# Patient Record
Sex: Female | Born: 1935 | Race: White | Hispanic: No | Smoking: Never smoker
Health system: Southern US, Community
[De-identification: ages and names within clinical notes are randomized; demographics above are authoritative.]

## PROBLEM LIST (undated history)

## (undated) DIAGNOSIS — S2241XA Multiple fractures of ribs, right side, initial encounter for closed fracture: Secondary | ICD-10-CM

## (undated) DIAGNOSIS — S225XXA Flail chest, initial encounter for closed fracture: Secondary | ICD-10-CM

## (undated) DIAGNOSIS — J90 Pleural effusion, not elsewhere classified: Secondary | ICD-10-CM

## (undated) HISTORY — PX: NASAL SINUS SURGERY: SHX719

## (undated) HISTORY — PX: BACK SURGERY: SHX140

## (undated) HISTORY — PX: BREAST SURGERY: SHX581

## (undated) HISTORY — PX: ABDOMINAL HYSTERECTOMY: SHX81

---

## 1968-09-03 HISTORY — PX: DILATION AND CURETTAGE OF UTERUS: SHX78

## 1993-09-03 HISTORY — PX: CHOLECYSTECTOMY: SHX55

## 1998-06-28 ENCOUNTER — Ambulatory Visit (HOSPITAL_COMMUNITY): Admission: RE | Admit: 1998-06-28 | Discharge: 1998-06-28 | Payer: Self-pay | Admitting: General Surgery

## 1998-10-06 ENCOUNTER — Other Ambulatory Visit: Admission: RE | Admit: 1998-10-06 | Discharge: 1998-10-06 | Payer: Self-pay | Admitting: Obstetrics and Gynecology

## 1999-12-29 ENCOUNTER — Encounter (INDEPENDENT_AMBULATORY_CARE_PROVIDER_SITE_OTHER): Payer: Self-pay

## 1999-12-29 ENCOUNTER — Other Ambulatory Visit: Admission: RE | Admit: 1999-12-29 | Discharge: 1999-12-29 | Payer: Self-pay | Admitting: Obstetrics and Gynecology

## 2000-03-04 ENCOUNTER — Other Ambulatory Visit: Admission: RE | Admit: 2000-03-04 | Discharge: 2000-03-04 | Payer: Self-pay | Admitting: Obstetrics and Gynecology

## 2000-09-03 HISTORY — PX: CARDIAC CATHETERIZATION: SHX172

## 2001-03-07 ENCOUNTER — Other Ambulatory Visit: Admission: RE | Admit: 2001-03-07 | Discharge: 2001-03-07 | Payer: Self-pay | Admitting: Obstetrics and Gynecology

## 2001-03-26 ENCOUNTER — Encounter: Payer: Self-pay | Admitting: *Deleted

## 2001-03-26 ENCOUNTER — Inpatient Hospital Stay (HOSPITAL_COMMUNITY): Admission: AD | Admit: 2001-03-26 | Discharge: 2001-03-27 | Payer: Self-pay | Admitting: *Deleted

## 2001-04-03 ENCOUNTER — Encounter: Payer: Self-pay | Admitting: *Deleted

## 2001-04-03 ENCOUNTER — Ambulatory Visit (HOSPITAL_COMMUNITY): Admission: RE | Admit: 2001-04-03 | Discharge: 2001-04-03 | Payer: Self-pay | Admitting: *Deleted

## 2001-07-16 ENCOUNTER — Encounter: Admission: RE | Admit: 2001-07-16 | Discharge: 2001-09-02 | Payer: Self-pay | Admitting: Internal Medicine

## 2001-08-21 ENCOUNTER — Encounter: Admission: RE | Admit: 2001-08-21 | Discharge: 2001-08-21 | Payer: Self-pay | Admitting: Internal Medicine

## 2001-08-21 ENCOUNTER — Encounter: Payer: Self-pay | Admitting: Internal Medicine

## 2001-09-03 HISTORY — PX: OTHER SURGICAL HISTORY: SHX169

## 2001-09-09 ENCOUNTER — Ambulatory Visit (HOSPITAL_COMMUNITY): Admission: RE | Admit: 2001-09-09 | Discharge: 2001-09-09 | Payer: Self-pay | Admitting: Internal Medicine

## 2002-03-09 ENCOUNTER — Other Ambulatory Visit: Admission: RE | Admit: 2002-03-09 | Discharge: 2002-03-09 | Payer: Self-pay | Admitting: Obstetrics and Gynecology

## 2002-09-15 ENCOUNTER — Ambulatory Visit (HOSPITAL_COMMUNITY): Admission: RE | Admit: 2002-09-15 | Discharge: 2002-09-15 | Payer: Self-pay | Admitting: Internal Medicine

## 2003-04-02 ENCOUNTER — Other Ambulatory Visit: Admission: RE | Admit: 2003-04-02 | Discharge: 2003-04-02 | Payer: Self-pay | Admitting: Obstetrics and Gynecology

## 2003-07-12 ENCOUNTER — Ambulatory Visit (HOSPITAL_COMMUNITY): Admission: RE | Admit: 2003-07-12 | Discharge: 2003-07-12 | Payer: Self-pay | Admitting: *Deleted

## 2003-09-21 ENCOUNTER — Encounter: Admission: RE | Admit: 2003-09-21 | Discharge: 2003-09-21 | Payer: Self-pay | Admitting: Internal Medicine

## 2003-11-04 ENCOUNTER — Encounter: Admission: RE | Admit: 2003-11-04 | Discharge: 2004-01-06 | Payer: Self-pay | Admitting: Neurology

## 2004-01-25 ENCOUNTER — Encounter: Admission: RE | Admit: 2004-01-25 | Discharge: 2004-02-24 | Payer: Self-pay | Admitting: Neurology

## 2005-04-13 ENCOUNTER — Other Ambulatory Visit: Admission: RE | Admit: 2005-04-13 | Discharge: 2005-04-13 | Payer: Self-pay | Admitting: Obstetrics and Gynecology

## 2006-06-10 ENCOUNTER — Other Ambulatory Visit: Admission: RE | Admit: 2006-06-10 | Discharge: 2006-06-10 | Payer: Self-pay | Admitting: Obstetrics & Gynecology

## 2006-08-02 ENCOUNTER — Ambulatory Visit (HOSPITAL_COMMUNITY): Admission: RE | Admit: 2006-08-02 | Discharge: 2006-08-02 | Payer: Self-pay | Admitting: Internal Medicine

## 2007-06-13 ENCOUNTER — Other Ambulatory Visit: Admission: RE | Admit: 2007-06-13 | Discharge: 2007-06-13 | Payer: Self-pay | Admitting: Obstetrics and Gynecology

## 2007-11-04 ENCOUNTER — Inpatient Hospital Stay (HOSPITAL_COMMUNITY): Admission: EM | Admit: 2007-11-04 | Discharge: 2007-11-07 | Payer: Self-pay | Admitting: Emergency Medicine

## 2007-11-10 ENCOUNTER — Ambulatory Visit: Payer: Self-pay | Admitting: Gastroenterology

## 2007-12-01 ENCOUNTER — Encounter: Admission: RE | Admit: 2007-12-01 | Discharge: 2007-12-31 | Payer: Self-pay | Admitting: Internal Medicine

## 2007-12-03 ENCOUNTER — Ambulatory Visit (HOSPITAL_COMMUNITY): Admission: RE | Admit: 2007-12-03 | Discharge: 2007-12-03 | Payer: Self-pay | Admitting: *Deleted

## 2007-12-03 ENCOUNTER — Encounter (INDEPENDENT_AMBULATORY_CARE_PROVIDER_SITE_OTHER): Payer: Self-pay | Admitting: *Deleted

## 2008-02-16 ENCOUNTER — Ambulatory Visit: Payer: Self-pay | Admitting: Vascular Surgery

## 2008-06-23 ENCOUNTER — Other Ambulatory Visit: Admission: RE | Admit: 2008-06-23 | Discharge: 2008-06-23 | Payer: Self-pay | Admitting: Obstetrics and Gynecology

## 2008-09-03 HISTORY — PX: KYPHOPLASTY: SHX5884

## 2009-01-12 ENCOUNTER — Ambulatory Visit (HOSPITAL_COMMUNITY): Admission: RE | Admit: 2009-01-12 | Discharge: 2009-01-12 | Payer: Self-pay | Admitting: *Deleted

## 2009-01-12 ENCOUNTER — Encounter (INDEPENDENT_AMBULATORY_CARE_PROVIDER_SITE_OTHER): Payer: Self-pay | Admitting: *Deleted

## 2009-02-21 ENCOUNTER — Encounter: Admission: RE | Admit: 2009-02-21 | Discharge: 2009-02-21 | Payer: Self-pay | Admitting: Orthopedic Surgery

## 2009-02-24 ENCOUNTER — Encounter: Admission: RE | Admit: 2009-02-24 | Discharge: 2009-02-24 | Payer: Self-pay | Admitting: Orthopedic Surgery

## 2009-02-28 ENCOUNTER — Encounter: Admission: RE | Admit: 2009-02-28 | Discharge: 2009-02-28 | Payer: Self-pay | Admitting: Orthopedic Surgery

## 2009-03-03 ENCOUNTER — Encounter: Admission: RE | Admit: 2009-03-03 | Discharge: 2009-03-03 | Payer: Self-pay | Admitting: Orthopedic Surgery

## 2009-03-15 ENCOUNTER — Encounter: Admission: RE | Admit: 2009-03-15 | Discharge: 2009-03-15 | Payer: Self-pay | Admitting: Orthopedic Surgery

## 2009-04-13 ENCOUNTER — Encounter: Admission: RE | Admit: 2009-04-13 | Discharge: 2009-06-06 | Payer: Self-pay | Admitting: Internal Medicine

## 2009-05-26 ENCOUNTER — Inpatient Hospital Stay (HOSPITAL_COMMUNITY): Admission: EM | Admit: 2009-05-26 | Discharge: 2009-06-04 | Payer: Self-pay | Admitting: Emergency Medicine

## 2009-05-27 ENCOUNTER — Encounter (INDEPENDENT_AMBULATORY_CARE_PROVIDER_SITE_OTHER): Payer: Self-pay | Admitting: Gastroenterology

## 2009-05-31 ENCOUNTER — Encounter (INDEPENDENT_AMBULATORY_CARE_PROVIDER_SITE_OTHER): Payer: Self-pay | Admitting: Gastroenterology

## 2009-09-03 HISTORY — PX: CATARACT EXTRACTION: SUR2

## 2010-03-28 ENCOUNTER — Encounter: Admission: RE | Admit: 2010-03-28 | Discharge: 2010-04-27 | Payer: Self-pay | Admitting: Neurology

## 2010-05-15 ENCOUNTER — Ambulatory Visit (HOSPITAL_COMMUNITY): Admission: RE | Admit: 2010-05-15 | Discharge: 2010-05-15 | Payer: Self-pay | Admitting: Internal Medicine

## 2010-09-07 ENCOUNTER — Encounter
Admission: RE | Admit: 2010-09-07 | Discharge: 2010-09-07 | Payer: Self-pay | Source: Home / Self Care | Attending: Gastroenterology | Admitting: Gastroenterology

## 2010-10-30 ENCOUNTER — Ambulatory Visit (HOSPITAL_COMMUNITY): Payer: Medicare Other | Attending: Internal Medicine

## 2010-10-30 DIAGNOSIS — M810 Age-related osteoporosis without current pathological fracture: Secondary | ICD-10-CM | POA: Insufficient documentation

## 2010-12-07 LAB — HEMOCCULT GUIAC POC 1CARD (OFFICE): Fecal Occult Bld: NEGATIVE

## 2010-12-08 LAB — URINALYSIS, ROUTINE W REFLEX MICROSCOPIC
Bilirubin Urine: NEGATIVE
Glucose, UA: NEGATIVE mg/dL
Hgb urine dipstick: NEGATIVE
Nitrite: NEGATIVE
Specific Gravity, Urine: 1.024 (ref 1.005–1.030)
pH: 5.5 (ref 5.0–8.0)

## 2010-12-08 LAB — BASIC METABOLIC PANEL
BUN: 14 mg/dL (ref 6–23)
BUN: 4 mg/dL — ABNORMAL LOW (ref 6–23)
CO2: 26 mEq/L (ref 19–32)
Chloride: 110 mEq/L (ref 96–112)
Creatinine, Ser: 0.95 mg/dL (ref 0.4–1.2)
GFR calc non Af Amer: 57 mL/min — ABNORMAL LOW (ref >60)
GFR calc non Af Amer: 59 mL/min — ABNORMAL LOW (ref >60)
Glucose, Bld: 78 mg/dL (ref 70–99)
Glucose, Bld: 98 mg/dL (ref 70–99)
Potassium: 3.7 mEq/L (ref 3.5–5.1)
Potassium: 3.7 mEq/L (ref 3.5–5.1)
Sodium: 140 mEq/L (ref 135–145)

## 2010-12-08 LAB — CBC
HCT: 31.8 % — ABNORMAL LOW (ref 36.0–46.0)
HCT: 35 % — ABNORMAL LOW (ref 36.0–46.0)
HCT: 35.5 % — ABNORMAL LOW (ref 36.0–46.0)
Hemoglobin: 10.6 g/dL — ABNORMAL LOW (ref 12.0–15.0)
Hemoglobin: 11.9 g/dL — ABNORMAL LOW (ref 12.0–15.0)
Hemoglobin: 12 g/dL (ref 12.0–15.0)
Hemoglobin: 13 g/dL (ref 12.0–15.0)
MCHC: 33.7 g/dL (ref 30.0–36.0)
MCHC: 33.8 g/dL (ref 30.0–36.0)
MCHC: 33.9 g/dL (ref 30.0–36.0)
MCHC: 33.9 g/dL (ref 30.0–36.0)
MCV: 95.3 fL (ref 78.0–100.0)
MCV: 95.3 fL (ref 78.0–100.0)
MCV: 96.7 fL (ref 78.0–100.0)
Platelets: 208 10*3/uL (ref 150–400)
Platelets: 234 10*3/uL (ref 150–400)
RBC: 3.32 MIL/uL — ABNORMAL LOW (ref 3.87–5.11)
RBC: 4.01 MIL/uL (ref 3.87–5.11)
RDW: 13.9 % (ref 11.5–15.5)
RDW: 14 % (ref 11.5–15.5)
RDW: 14.2 % (ref 11.5–15.5)
WBC: 13.7 10*3/uL — ABNORMAL HIGH (ref 4.0–10.5)
WBC: 9.3 10*3/uL (ref 4.0–10.5)

## 2010-12-08 LAB — COMPREHENSIVE METABOLIC PANEL
ALT: 28 U/L (ref 0–35)
ALT: 29 U/L (ref 0–35)
AST: 26 U/L (ref 0–37)
AST: 35 U/L (ref 0–37)
Albumin: 2.5 g/dL — ABNORMAL LOW (ref 3.5–5.2)
Alkaline Phosphatase: 63 U/L (ref 39–117)
Alkaline Phosphatase: 78 U/L (ref 39–117)
CO2: 24 mEq/L (ref 19–32)
CO2: 24 mEq/L (ref 19–32)
Calcium: 9.4 mg/dL (ref 8.4–10.5)
Chloride: 107 mEq/L (ref 96–112)
Creatinine, Ser: 1.02 mg/dL (ref 0.4–1.2)
GFR calc Af Amer: 52 mL/min — ABNORMAL LOW (ref >60)
GFR calc Af Amer: >60 mL/min (ref >60)
GFR calc non Af Amer: 43 mL/min — ABNORMAL LOW (ref >60)
GFR calc non Af Amer: 52 mL/min — ABNORMAL LOW (ref >60)
Glucose, Bld: 94 mg/dL (ref 70–99)
Potassium: 3.5 mEq/L (ref 3.5–5.1)
Potassium: 3.5 mEq/L (ref 3.5–5.1)
Sodium: 137 mEq/L (ref 135–145)
Total Bilirubin: 0.5 mg/dL (ref 0.3–1.2)
Total Protein: 6.4 g/dL (ref 6.0–8.3)

## 2010-12-08 LAB — DIFFERENTIAL
Basophils Absolute: 0.1 10*3/uL (ref 0.0–0.1)
Basophils Relative: 0 % (ref 0–1)
Basophils Relative: 0 % (ref 0–1)
Eosinophils Absolute: 0 10*3/uL (ref 0.0–0.7)
Eosinophils Absolute: 0.1 10*3/uL (ref 0.0–0.7)
Eosinophils Relative: 0 % (ref 0–5)
Eosinophils Relative: 1 % (ref 0–5)
Lymphocytes Relative: 7 % — ABNORMAL LOW (ref 12–46)
Lymphs Abs: 1 10*3/uL (ref 0.7–4.0)
Monocytes Absolute: 0.4 10*3/uL (ref 0.1–1.0)
Monocytes Relative: 3 % (ref 3–12)

## 2010-12-08 LAB — RETICULOCYTES
RBC.: 3.71 MIL/uL — ABNORMAL LOW (ref 3.87–5.11)
Retic Ct Pct: 0.8 % (ref 0.4–3.1)

## 2010-12-08 LAB — URINE MICROSCOPIC-ADD ON

## 2010-12-08 LAB — STOOL CULTURE

## 2010-12-08 LAB — CULTURE, BLOOD (ROUTINE X 2)
Culture: NO GROWTH
Culture: NO GROWTH

## 2010-12-08 LAB — CK TOTAL AND CKMB (NOT AT ARMC): Total CK: 18 U/L (ref 7–177)

## 2010-12-08 LAB — CLOSTRIDIUM DIFFICILE EIA

## 2010-12-08 LAB — IRON AND TIBC
Saturation Ratios: 35 % (ref 20–55)
UIBC: 136 ug/dL

## 2010-12-08 LAB — VITAMIN B12: Vitamin B-12: 1181 pg/mL — ABNORMAL HIGH (ref 211–911)

## 2010-12-08 LAB — HEMOCCULT GUIAC POC 1CARD (OFFICE): Fecal Occult Bld: NEGATIVE

## 2010-12-08 LAB — FOLATE: Folate: >20 ng/mL

## 2010-12-08 LAB — LACTATE DEHYDROGENASE: LDH: 126 U/L (ref 94–250)

## 2011-01-16 NOTE — Op Note (Signed)
NAMEJAYLYN, Linda [name]              ACCOUNT NO.:  [id number]   MEDICAL RECORD NO.:  [id number]          PATIENT TYPE:  AMB   LOCATION:  ENDO                         FACILITY:  [facility]   PHYSICIAN:  Georgiana Spinner, M.D.    DATE OF BIRTH:  30-Mar-1929   DATE OF PROCEDURE:  DATE OF DISCHARGE:                               OPERATIVE REPORT   PROCEDURE:  Upper endoscopy.   INDICATIONS:  Question of Barrett esophagus.   ANESTHESIA:  Fentanyl 25 mcg, Versed 2 mg.   PROCEDURE:  With the patient mildly sedated in the left lateral  decubitus position, the Pentax videoscopic endoscope was inserted in the  mouth, passed under direct vision through the esophagus, which appeared  normal until we reached the distal esophagus and there appeared to be  areas of Barrett's.  Islands of mucosa were more salmon-colored than the  surrounding whitish background.  We photographed them and then did  biopsies of this on direct view.  We then entered into the stomach  fundus, body, antrum, duodenal bulb, second portion of the duodenum,  which appeared normal.  From this point, the endoscope was slowly  withdrawn, taking circumferential views of the duodenal mucosa until the  endoscope had been pulled back into the stomach, placed in retroflexion  to view the stomach from below.  A widely open GE junction was noted.  The endoscope was then straightened and withdrawn, taking  circumferential views of the remaining gastric and esophageal mucosa,  stopping again in the distal esophagus where, again, changes were noted  that were photographed and biopsied.  The endoscope was withdrawn.  The  patient's vital signs, pulse oximeter remained stable.  The patient  tolerated the procedure well without any apparent complications.   FINDINGS:  Changes in the esophagus compatible with Barrett esophagus,  biopsied.  Await biopsy report.  The patient will call me for results  and follow up with me as an outpatient.       ______________________________  Georgiana Spinner, M.D.     GMO/MEDQ  D:  01/12/2009  T:  01/12/2009  Job:  [id number]

## 2011-01-16 NOTE — Procedures (Signed)
DUPLEX DEEP VENOUS EXAM - LOWER EXTREMITY   INDICATION:  Left leg pain and swelling.   HISTORY:  Edema:  Left leg edema x3 months.  Trauma/Surgery:  No.  Pain:  Pain for three months in the left leg.  PE:  No.  Previous DVT:  No.  Anticoagulants:  Patient was on aspirin until early March, 2009 when she  discontinued it secondary to stomach ulcers.  Other:  No.   DUPLEX EXAM:                CFV   SFV   PopV   PTV   GSV                R  L  R  L  R  L   R  L  R  L  Thrombosis    O  o     o     o      o     o  Spontaneous   +  +     +     +      +     +  Phasic        +  +     +     +      +     +  Augmentation  +  +     +     +      +     +  Compressible  +  +     +     +      +     +  Competent     +  O     +     +      +     +   Legend:  + - yes  o - no  p - partial  D - decreased   IMPRESSION:  1. No evidence of left leg deep or superficial vein thrombus.  No      evidence of left leg baker's cyst.  2. Mild reflux is seen in the left common femoral vein and left short      saphenous vein.    _____________________________  Janetta Hora. Fields, MD   MC/MEDQ  D:  02/16/2008  T:  02/16/2008  Job:  [id number]

## 2011-01-16 NOTE — Op Note (Signed)
NAMEAVILENE, [name]              ACCOUNT NO.:  [id number]   MEDICAL RECORD NO.:  [id number]          PATIENT TYPE:  AMB   LOCATION:  ENDO                         [facility]ACILITY:  [facility]   PHYSICIAN:  Georgiana Spinner, M.D.    DATE OF BIRTH:  18-Mar-1929   DATE OF PROCEDURE:  12/03/2007  DATE OF DISCHARGE:                               OPERATIVE REPORT   PROCEDURE:  Upper endoscopy.   INDICATIONS:  Follow-up of gastric ulcer that bled.   ANESTHESIA:  Fentanyl 50 mcg, Versed 3 mg.   PROCEDURE:  With the patient mildly sedated in the left lateral  decubitus position, the Pentax videoscopic endoscope was inserted in the  mouth, passed under direct vision through the esophagus.  There appeared  to be some changes of Barrett's esophagus, photographed and biopsied.  We entered into the stomach.  Fundus, body, antrum, duodenal bulb,  second portion of the duodenum appeared normal.  From this point the  endoscope was slowly withdrawn taking circumferential views of duodenal  mucosa until the endoscope had been pulled back into stomach, placed in  retroflexion to view the stomach from below.  The endoscope was  straightened and withdrawn taking circumferential views of the remaining  gastric and esophageal mucosa.  The patient's vital signs and pulse  oximeter remained stable.  The patient tolerated the procedure well  without apparent complications.   FINDINGS:  Question of Barrett's esophagus, biopsied.   Await biopsy report.  The patient will call me for results and follow up  with me as an outpatient.           ______________________________  Georgiana Spinner, M.D.     GMO/MEDQ  D:  12/03/2007  T:  12/03/2007  Job:  [id number]

## 2011-01-16 NOTE — H&P (Signed)
NAMEBENITA, Linda [name]              ACCOUNT NO.:  [id number]   MEDICAL RECORD NO.:  [id number]          PATIENT TYPE:  INP   LOCATION:  0105                         FACILITY:  [facility]   PHYSICIAN:  Della Goo, M.D. DATE OF BIRTH:  05-Apr-1929   DATE OF ADMISSION:  11/04/2007  DATE OF DISCHARGE:                              HISTORY & PHYSICAL   PRIMARY CARE PHYSICIAN:  Dr. Merri Brunette.   CHIEF COMPLAINT:  Nausea, vomiting.   HISTORY OF PRESENT ILLNESS:  This is a 77 year old female presenting to  the emergency department with complaints of nausea, vomiting, abdominal  pain.  She reports having abdominal pain for 2 weeks, also nausea which  started today.  The patient also reports vomiting twice and vomiting up  blood.  The patient was seen and evaluated in the emergency department.  Found to have low blood pressure and treated with IV fluids and had  improvement in her blood pressure.  The patient also continued to have  nausea which was unrelieved with medical therapy and was referred for  admission.  The patient describes having abdominal pain which is dull  and diffuse and rates the pain as being a 4/10.   PAST MEDICAL HISTORY:  Significant for hypothyroidism, coronary artery  disease/angina, and neuropathy.   PAST SURGICAL HISTORY:  History of multiple back surgeries.   MEDICATIONS:  The patient is unable to give at this time.   ALLERGIES:  IBUPROFEN and PENICILLINS.   SOCIAL HISTORY:  The patient lives with her oldest son.  She is a  nonsmoker, nondrinker.   FAMILY HISTORY:  Noncontributory.   PHYSICAL EXAMINATION:  GENERAL:  Pleasant, thin 77 year old female in no  discomfort or acute distress.  VITAL SIGNS:  Temperature 98.6 orally, blood pressure 120/65, heart rate  83, respirations 18, oxygen saturation 100%.  HEENT:  Normocephalic, atraumatic.  Pupils equally round and reactive to  light.  Extraocular muscles intact.  Funduscopic benign.  Oropharynx is   clear.  NECK:  Supple.  Full range of motion.  No thyromegaly, adenopathy,  jugular venous distention.  CARDIOVASCULAR:  Regular rate and rhythm.  No murmurs, gallops or rubs.  LUNGS:  Clear to auscultation bilaterally.  ABDOMEN:  Positive bowel sounds.  Soft, nontender, nondistended.  EXTREMITIES:  Without cyanosis, clubbing or edema.  NEUROLOGIC:  Neurologic examination nonfocal.   LABORATORY STUDIES:  White blood cell count 7.5, hemoglobin 9.4,  hematocrit 27.6, platelets 226,000, neutrophils 80%, lymphocytes 12%.  Sodium 137, potassium 4.2, chloride 105, carbon dioxide 26, BUN 29,  creatinine 1.06, calcium 8.4, lipase 29.  Urinalysis negative.  CT scan  of the abdomen negative for any acute findings.   ASSESSMENT:  A 77 year old female being admitted with:  1. Acute gastroenteritis.  2. Abdominal pain.  3. Anemia.  4. Prerenal azotemia.  5. Coronary artery disease history.  6. Hypothyroidism history.   PLAN:  The patient will be made n.p.o. which will advance to clears as  the patient may tolerate.  IV fluids have been ordered.  The patient  will be started on antiemetic and pain control therapy as needed.  An  anemia workup will also be started with serial hemoglobin level checks  for the next 48 hours.  Deep venous thrombosis and GI prophylaxis have  been ordered as well. Gastroenterology will be consulted.  In the past  the patient has seen Dr. Sabino Gasser and had a colonoscopy performed in  2004.      Della Goo, M.D.  Electronically Signed     HJ/MEDQ  D:  11/04/2007  T:  11/05/2007  Job:  [id number]   cc:   Soyla Murphy. Renne Crigler, M.D.  Fax: [phone]

## 2011-01-16 NOTE — Discharge Summary (Signed)
Linda [name], Linda [name]              ACCOUNT NO.:  [id number]   MEDICAL RECORD NO.:  [id number]          PATIENT TYPE:  INP   LOCATION:  1515                         FACILITY:  [facility]   PHYSICIAN:  Isidor Holts, M.D.  DATE OF BIRTH:  [birth year]   DATE OF ADMISSION:  11/04/2007  DATE OF DISCHARGE:  11/07/2007                               DISCHARGE SUMMARY   PRIMARY CARE PHYSICIAN:  Dr. Merri Brunette.   PRIMARY GASTROENTEROLOGIST:  Dr. Sabino Gasser.Marland Kitchen   DISCHARGE DIAGNOSES:  1. Acute gastritis.  2. Multiple gastric ulcers confirmed by EGD November 06, 2007.  3. Upper gastrointestinal bleed secondary to #2 above.  4. Acute blood loss anemia secondary to #3 above.  Required      transfusion 3 units PRBC on November 06, 2007.  5. Dehydration.  6. Coronary artery disease.  7. History of right lower lobe lung nodule.  8. History of vertigo.  9. History of multiple back surgeries.   DISCHARGE MEDICATIONS:  Protonix 40 mg p.o. b.i.d.   PREADMISSION MEDICATIONS.:  1. Levothroid.  2. Isosorbide mononitrate.  3. Renexa.  4. Evista.  5. Fosamax.  6. Multivitamin.  7. Calcium.  8. Flax seed oil.  Note dosages of these medications are unknown, however no change has  been made during this hospitalization. Aspirin, however, has been  discontinued secondary to gastric ulcers/gastrointestinal bleed until  reevaluated by primary MD, Dr. Merri Brunette.   PROCEDURES:  1. Abdominal/chest x-ray dated November 04, 2007. This showed no acute      cardiopulmonary disease. Bowel gas pattern appears normal.  There      is a nodule in the right lung. Follow-up noncontrast CT of chest      recommended.  2. Head CT scan dated November 05, 2007. This showed no acute intracranial      abnormality,  stable exam.  3. Abdominal/pelvic CT scan dated November 04, 2007. This showed negative      CT of the abdomen. Biliary ductal tree appears slightly prominent      but this is not unusual in a patient of this age who has had  a      previous cholecystectomy.  Pelvic CT scan showed no acute finding      in the pelvis.  There was diverticulosis without CT evidence of      diverticulitis.  4. Chest CT scan dated November 04, 2007.  This relates to the 5 mm      calcified granuloma right lower lobe.  There are a few other      peripheral nodules in the right lower lobe and right middle lobe      that this probably related to old granulomatous disease.  There was      no worrisome finding in the chest.  5. Upper GI endoscopy dated November 05, 2007 done by Dr. Rob Bunting,      gastroenterologist.  This showed three gastric ulcers, all cleaned      based but was fairly deep appearing. CLO testing done to check for      H. Pylori, this was negative.  CONSULTATIONS:  Dr. Rob Bunting, gastroenterologist.   ADMISSION HISTORY:  As in H&P notes of November 04, 2007 dictated by Dr.  Della Goo.  However in brief,  this is a 77 year old female, with  a known history of hypothyroidism, coronary artery disease, neuropathy,  history of multiple back surgeries who presents with nausea and vomiting  associated with hematemesis.  She was admitted for further evaluation,  investigation and management.   CLINICAL COURSE:  1. Acute gastritis. For details of presentation, refer to admission      history above.  The patient was managed with bowel rest, proton      pump inhibitor treatment and antiemetics and by November 05, 2007 the      symptoms had resolved.  It is likely the patient had a low grade      gastritis which precipitated her vomiting. For other findings refer      to #2 below.   1. Upper GI bleed.  As mentioned above, the patient presented with      episodes of vomiting associated with hematemesis. Reportedly she      was found to have low BP in the emergency department and was      resuscitated with intravenous fluids, resulting in a blood pressure      of 120/65 mmHg.  She was maintained on bowel rest, blood was  typed      and crossed,  intravenous fluids were continued as well as twice-      daily proton pump inhibitor.  As she had associated abdominal pain,      it was felt that a GI consultation warranted.  This was kindly      provided by Dr. Rob Bunting who performed an upper GI endoscopy      on November 05, 2007 which demonstrated 3 gastric ulcers.  He has      recommended continued twice-daily proton pump inhibitor as well as      follow-up repeat endoscopy in approximately 2 months' time.      Predictably the patient's pre-admission Aspirin is on hold.      Happily, the CLO-test for H pylori was negative.   1. Acute blood loss anemia.  This is secondary to #1 above.  The      patient's hemoglobin at the time of presentation appeared fairly      reasonable at 9.4; however, over the subsequent couple days of      hospitalization it dropped to a low of 8.7 on November 06, 2007      necessitating transfusion with 2 units of PRBCs resulting in a      satisfactory post transfusion bump of 11.7 hemoglobin on November 07, 2007. The patient does have a known history of chronic anemia. Iron      studies showed the following findings.  Iron 59, TIBC 275,      percentage saturation 21.  B12 was 790.  Ferritin was 28. Likely      these findings were consistent with anemia of chronic disease.  The      patient certainly would benefit from periodic hemoglobin checks. We      shall defer this to her primary MD.   1. Dehydration.  The patient at the time of presentation had a BUN of      29, creatinine 1.06.  This was likely secondary to vomiting.  It      was managed with intravenous fluid hydration and  we are pleased to      note that as of November 07, 2007  BUN was 9, creatinine 1.04.   1. History of coronary artery disease.  There were no symptoms      referable to this during the course of the patient's      hospitalization.   1. History of lung nodule.  The patient had suspicious findings for       right lower lung nodule on initial chest x-ray however, chest CT      scan showed no worrisome findings. Apparently findings reported on      x-ray were related to old granulomatous disease.  The patient has      been reassured accordingly.   DISPOSITION:  The patient was on November 07, 2007 considered sufficiently  clinically stable to be discharged. She was therefore discharged  accordingly.   DIET:  Heart-healthy.   ACTIVITY:  Recommended to increase activity slowly.   FOLLOW-UP INSTRUCTIONS:  The patient is recommended to follow up with  Dr. Merri Brunette, her primary MD, within 1 week of discharge.  She is to  call for an appointment.  She is also to follow up with Dr. Sabino Gasser,  gastroenterologist, in 2 weeks.   SPECIAL INSTRUCTIONS:  The patient has been recommended to visit Dr.  Carolee Rota office in the a.m. of November 10, 2007 for a hemoglobin check.  She  is also to avoid Aspirin until reviewed by Dr. Merri Brunette on follow-up  visit. All of this has been communicated to the patient and she has  verbalized her understanding.      Isidor Holts, M.D.  Electronically Signed     CO/MEDQ  D:  11/07/2007  T:  11/07/2007  Job:  [id number]   cc:   Soyla Murphy. Renne Crigler, M.D.  Fax: [phone]   Georgiana Spinner, M.D.  Fax: [phone]

## 2011-01-19 NOTE — Op Note (Signed)
   NAME:  Linda [name], Linda [name]                        ACCOUNT NO.:  [id number]   MEDICAL RECORD NO.:  [id number]                   PATIENT TYPE:  AMB   LOCATION:  ENDO                                 [facility]ACILITY[facility]   PHYSICIAN:  Georgiana Spinner, M.D.                 DATE OF BIRTH:  Jun 05, 1929   DATE OF PROCEDURE:  07/12/2003  DATE OF DISCHARGE:                                 OPERATIVE REPORT   PROCEDURE:  Colonoscopy.   INDICATIONS:  Colon cancer screening.   ANESTHESIA:  Demerol 40 mg, Versed 7 mg.   DESCRIPTION OF PROCEDURE:  With the patient mildly sedated in the left  lateral decubitus position, the Olympus videoscopic colonoscope was inserted  in the rectum and passed under direct vision to the cecum, identified by the  ileocecal valve and base of the cecum.  From this point the colonoscope was  slowly withdrawn, taking circumferential views of the colonic mucosa,  stopping only in the rectum, which appeared normal on direct and retroflexed  view.  The endoscope was straightened and withdrawn.  The patient's vital  signs and pulse oximetry remained stable.  The patient tolerated the  procedure well without apparent complications.   FINDINGS:  Unremarkable examination.   PLAN:  Repeat in five to 10 years.                                               Georgiana Spinner, M.D.    GMO/MEDQ  D:  07/12/2003  T:  07/12/2003  Job:  [id number]

## 2011-01-19 NOTE — Discharge Summary (Signed)
[place]  Patient:    Linda [name], Linda [name]                     MRN: [id number] Adm. Date:  [id number] Disc. Date: [id number] Attending:  Lenise Herald H Dictator:   Raymon Mutton, P.A. CC:         Soyla Murphy. Renne Crigler, M.D.   Discharge Summary  DATE OF BIRTH:  October 23, 2028  DISCHARGE DIAGNOSES: 1. Chest pain, etiology undetermined, status post negative Cardiolite stress    test. 2. Hypothyroidism. 3. Status post multiple back surgeries with peripheral neuropathy. 4. Right middle lobe lung nodule diagnosed by chest x-ray on March 26, 2001,    that requires CT scan follow-up.  HISTORY OF PRESENT ILLNESS:  Linda [name] is a 77 year old white female who was referred to [place] by Soyla Murphy. Renne Crigler, M.D., with complaints of chest pain and subsequent positive Cardiolite test for anteroapical ischemia and ejection fraction 74%.  She exercised for three minutes.  Her heart rate went up to 137, which is 91% of predicted maximum heart rate.  The patient developed shortness of breath and fatigue secondary to that test and the exercise part of the test was stopped.  She presented yesterday with an episode of chest pain that started a couple of nights ago and awakened her from sleep.  She described more as a tightness radiating to the toe and shortness of breath, but no nausea.  She also experienced a clamminess of her limbs for probably five minutes.  The episode of chest pain repeated itself in the office and the patient was given nitroglycerin spray, but the EKG which was done prior to the episode did not show any changes.  A Cardiolite test was done on March 18, 2001.  At the time of the test, it was recommended that the patient undergo cardiac catheterization for definitive diagnosis.  HOSPITAL COURSE:  The patient was admitted to the telemetry unit and started on heparin per pharmacy protocol, aspirin, and nitroglycerin IV titrated.  She obtained relief.  She was  scheduled for a cardiac catheterization the next morning.  Her blood work revealed negative cardiac enzymes.  Her CBC showed a white blood cell count of 5.7, a hemoglobin of 12.4, a hematocrit of 36, and platelets 236.  The BMP showed sodium 141, potassium 4.2, BUN 19, creatinine 1.0, and glucose 152.  This morning, the patient was found in stable condition and free of chest pain.  She was taken to the catheterization laboratory and was given appropriate sedation.  The catheterization was performed by Lenise Herald, M.D.  Essentially it was negative cardiac catheterization.  The coronary arteries were clear.  The patient was transferred back to the telemetry unit in stable condition and heparin was discontinued.  As a part of routine preparation for the cardiac catheterization, the patient had a chest x-ray yesterday.  The report showed a questionable 7 mm nodule overlying the right middle lobe lung.  No prior films were available for comparison.  It was recommended that the patient have a CT scan.  Her EKGs that were taken here in the hospital remained in normal sinus rhythm and did not reveal any acute changes.  Prior to the cardiac study, the patient was screened for phase 2 study and consent was signed.  Heparin was stopped and she was started on Plavix.  After catheterization, which was clean, the Plavix was stopped.  DISPOSITION:  The patient was discharged home on March 27, 2001, in stable condition.  DISCHARGE MEDICATIONS: 1. Levothyroxine 50 mcg q.d. 2. Neurontin 300 mg t.i.d. 3. Evista 60 mg q.d. 4. Aspirin 325 mg q.d. 5. Protonix 40 mg q.d.  DISCHARGE FOLLOW-UP:  An appointment for a CT scan was scheduled for April 03, 2001, at 8:30 a.m.  This will be done at [facility].  A follow-up appointment with Lenise Herald, M.D., in two to three weeks after the hospitalization.  Also, the patient was advised to follow up with Soyla Murphy. Renne Crigler,  M.D.  DISCHARGE INSTRUCTIONS:  No driving.  No heavy lifting.  No pushing.  No strenuous activities.  No sexual activity for three days after the catheterization.  WOUND CARE:  The patient was instructed to gently wash the groin area with mild soap and avoid any rubbing.  She is to report any signs of inflammation and increased swelling, redness, or any signs of bleeding or oozing to our office and the number was provided.  DISCHARGE DIET:  Low-fat, low-salt, low-cholesterol diet. DD:  03/27/01 TD:  03/28/01 Job: [id number]

## 2011-05-02 ENCOUNTER — Encounter (HOSPITAL_COMMUNITY): Payer: Medicare Other | Attending: Internal Medicine

## 2011-05-02 DIAGNOSIS — M810 Age-related osteoporosis without current pathological fracture: Secondary | ICD-10-CM | POA: Insufficient documentation

## 2011-05-17 ENCOUNTER — Other Ambulatory Visit: Payer: Self-pay | Admitting: Neurosurgery

## 2011-05-17 DIAGNOSIS — M546 Pain in thoracic spine: Secondary | ICD-10-CM

## 2011-05-24 ENCOUNTER — Ambulatory Visit
Admission: RE | Admit: 2011-05-24 | Discharge: 2011-05-24 | Disposition: A | Discharge status: Home / Self Care | Payer: Medicare Other | Source: Ambulatory Visit | Attending: Neurosurgery | Admitting: Neurosurgery

## 2011-05-24 DIAGNOSIS — M546 Pain in thoracic spine: Secondary | ICD-10-CM

## 2011-05-28 LAB — COMPREHENSIVE METABOLIC PANEL
ALT: 20
AST: 23
CO2: 26
Chloride: 105
GFR calc Af Amer: >60
GFR calc non Af Amer: 50 — ABNORMAL LOW
Sodium: 137
Total Bilirubin: 0.8

## 2011-05-28 LAB — CBC
HCT: 25.3 — ABNORMAL LOW
HCT: 25.5 — ABNORMAL LOW
HCT: 33.8 — ABNORMAL LOW
MCV: 91.1
Platelets: 184
Platelets: 196
Platelets: 204
RBC: 3.03 — ABNORMAL LOW
RDW: 12.8
RDW: 13.2
WBC: 7.2
WBC: 7.5

## 2011-05-28 LAB — BASIC METABOLIC PANEL
BUN: 11
BUN: 29 — ABNORMAL HIGH
BUN: 9
Calcium: 8.6
Creatinine, Ser: 0.98
Creatinine, Ser: 1.04
GFR calc Af Amer: >60
GFR calc non Af Amer: 51 — ABNORMAL LOW
GFR calc non Af Amer: 55 — ABNORMAL LOW
GFR calc non Af Amer: 59 — ABNORMAL LOW
Glucose, Bld: 116 — ABNORMAL HIGH
Glucose, Bld: 77
Potassium: 4.3
Potassium: 4.4
Potassium: 4.6

## 2011-05-28 LAB — CROSSMATCH

## 2011-05-28 LAB — HEMOGLOBIN AND HEMATOCRIT, BLOOD
HCT: 26.9 — ABNORMAL LOW
Hemoglobin: 8.6 — ABNORMAL LOW
Hemoglobin: 9 — ABNORMAL LOW
Hemoglobin: 9.2 — ABNORMAL LOW

## 2011-05-28 LAB — URINALYSIS, ROUTINE W REFLEX MICROSCOPIC
Hgb urine dipstick: NEGATIVE
Nitrite: NEGATIVE
Specific Gravity, Urine: 1.008
Urobilinogen, UA: 0.2
pH: 8

## 2011-05-28 LAB — DIFFERENTIAL
Basophils Absolute: 0
Basophils Absolute: 0
Basophils Relative: 0
Eosinophils Absolute: 0
Eosinophils Relative: 0
Eosinophils Relative: 1
Lymphocytes Relative: 25

## 2011-05-28 LAB — IRON AND TIBC
Iron: 59
Saturation Ratios: 21
TIBC: 275
UIBC: 216

## 2011-05-28 LAB — CLOTEST (H. PYLORI), BIOPSY: Helicobacter screen: NEGATIVE

## 2011-05-28 LAB — TSH: TSH: 0.613

## 2011-05-28 LAB — B-NATRIURETIC PEPTIDE (CONVERTED LAB): Pro B Natriuretic peptide (BNP): 121 — ABNORMAL HIGH

## 2011-05-28 LAB — FERRITIN: Ferritin: 28 (ref 10–291)

## 2011-05-28 LAB — LIPASE, BLOOD: Lipase: 29

## 2011-05-28 LAB — FOLATE RBC: RBC Folate: 1731 — ABNORMAL HIGH

## 2011-05-28 LAB — ABO/RH: ABO/RH(D): O POS

## 2011-10-19 DIAGNOSIS — L719 Rosacea, unspecified: Secondary | ICD-10-CM | POA: Diagnosis not present

## 2011-10-19 DIAGNOSIS — L821 Other seborrheic keratosis: Secondary | ICD-10-CM | POA: Diagnosis not present

## 2011-10-19 DIAGNOSIS — L570 Actinic keratosis: Secondary | ICD-10-CM | POA: Diagnosis not present

## 2011-11-01 DIAGNOSIS — H524 Presbyopia: Secondary | ICD-10-CM | POA: Diagnosis not present

## 2011-11-05 DIAGNOSIS — M810 Age-related osteoporosis without current pathological fracture: Secondary | ICD-10-CM | POA: Diagnosis not present

## 2011-11-07 ENCOUNTER — Encounter (HOSPITAL_COMMUNITY): Payer: Self-pay

## 2011-11-07 ENCOUNTER — Encounter (HOSPITAL_COMMUNITY)
Admission: RE | Admit: 2011-11-07 | Discharge: 2011-11-07 | Disposition: A | Discharge status: Home / Self Care | Payer: Medicare Other | Source: Ambulatory Visit | Attending: Internal Medicine | Admitting: Internal Medicine

## 2011-11-07 DIAGNOSIS — M810 Age-related osteoporosis without current pathological fracture: Secondary | ICD-10-CM | POA: Diagnosis not present

## 2011-11-07 MED ORDER — DENOSUMAB 60 MG/ML ~~LOC~~ SOLN
60.0000 mg | SUBCUTANEOUS | Status: AC
  Administered 2011-11-07: 60 mg via SUBCUTANEOUS
  Filled 2011-11-07: qty 1

## 2011-11-07 NOTE — Progress Notes (Signed)
Tolerated well

## 2011-11-15 ENCOUNTER — Emergency Department (HOSPITAL_COMMUNITY)
Admission: EM | Admit: 2011-11-15 | Discharge: 2011-11-15 | Disposition: A | Discharge status: Home / Self Care | Payer: Medicare Other | Attending: Emergency Medicine | Admitting: Emergency Medicine

## 2011-11-15 ENCOUNTER — Emergency Department (INDEPENDENT_AMBULATORY_CARE_PROVIDER_SITE_OTHER)
Admission: EM | Admit: 2011-11-15 | Discharge: 2011-11-15 | Disposition: A | Discharge status: Nursing Home (Includes ICF) | Payer: Medicare Other | Source: Home / Self Care

## 2011-11-15 ENCOUNTER — Encounter (HOSPITAL_COMMUNITY): Payer: Self-pay | Admitting: Emergency Medicine

## 2011-11-15 ENCOUNTER — Other Ambulatory Visit: Payer: Self-pay

## 2011-11-15 ENCOUNTER — Emergency Department (HOSPITAL_COMMUNITY): Payer: Medicare Other

## 2011-11-15 DIAGNOSIS — R51 Headache: Secondary | ICD-10-CM | POA: Insufficient documentation

## 2011-11-15 DIAGNOSIS — Y92009 Unspecified place in unspecified non-institutional (private) residence as the place of occurrence of the external cause: Secondary | ICD-10-CM | POA: Insufficient documentation

## 2011-11-15 DIAGNOSIS — K5289 Other specified noninfective gastroenteritis and colitis: Secondary | ICD-10-CM

## 2011-11-15 DIAGNOSIS — E860 Dehydration: Secondary | ICD-10-CM | POA: Diagnosis not present

## 2011-11-15 DIAGNOSIS — Z79899 Other long term (current) drug therapy: Secondary | ICD-10-CM | POA: Insufficient documentation

## 2011-11-15 DIAGNOSIS — S0180XA Unspecified open wound of other part of head, initial encounter: Secondary | ICD-10-CM

## 2011-11-15 DIAGNOSIS — G319 Degenerative disease of nervous system, unspecified: Secondary | ICD-10-CM | POA: Diagnosis not present

## 2011-11-15 DIAGNOSIS — W19XXXA Unspecified fall, initial encounter: Secondary | ICD-10-CM | POA: Insufficient documentation

## 2011-11-15 DIAGNOSIS — S0100XA Unspecified open wound of scalp, initial encounter: Secondary | ICD-10-CM | POA: Insufficient documentation

## 2011-11-15 DIAGNOSIS — R031 Nonspecific low blood-pressure reading: Secondary | ICD-10-CM | POA: Diagnosis not present

## 2011-11-15 DIAGNOSIS — K529 Noninfective gastroenteritis and colitis, unspecified: Secondary | ICD-10-CM

## 2011-11-15 DIAGNOSIS — M5030 Other cervical disc degeneration, unspecified cervical region: Secondary | ICD-10-CM | POA: Insufficient documentation

## 2011-11-15 DIAGNOSIS — S0990XA Unspecified injury of head, initial encounter: Secondary | ICD-10-CM | POA: Insufficient documentation

## 2011-11-15 DIAGNOSIS — M542 Cervicalgia: Secondary | ICD-10-CM | POA: Diagnosis not present

## 2011-11-15 DIAGNOSIS — S0101XA Laceration without foreign body of scalp, initial encounter: Secondary | ICD-10-CM

## 2011-11-15 DIAGNOSIS — S0181XA Laceration without foreign body of other part of head, initial encounter: Secondary | ICD-10-CM

## 2011-11-15 LAB — URINE MICROSCOPIC-ADD ON

## 2011-11-15 LAB — POCT I-STAT, CHEM 8
BUN: 29 mg/dL — ABNORMAL HIGH (ref 6–23)
Calcium, Ion: 1.17 mmol/L (ref 1.12–1.32)
Chloride: 110 mEq/L (ref 96–112)
Creatinine, Ser: 1.1 mg/dL (ref 0.50–1.10)
Glucose, Bld: 105 mg/dL — ABNORMAL HIGH (ref 70–99)
HCT: 44 % (ref 36.0–46.0)
Hemoglobin: 15 g/dL (ref 12.0–15.0)
Potassium: 4.2 mEq/L (ref 3.5–5.1)
Sodium: 142 mEq/L (ref 135–145)
TCO2: 23 mmol/L (ref 0–100)

## 2011-11-15 LAB — URINALYSIS, ROUTINE W REFLEX MICROSCOPIC
Bilirubin Urine: NEGATIVE
Glucose, UA: NEGATIVE mg/dL
Hgb urine dipstick: NEGATIVE
Ketones, ur: 15 mg/dL — AB
Nitrite: NEGATIVE
Protein, ur: 100 mg/dL — AB
Specific Gravity, Urine: 1.025 (ref 1.005–1.030)
Urobilinogen, UA: 0.2 mg/dL (ref 0.0–1.0)
pH: 6 (ref 5.0–8.0)

## 2011-11-15 MED ORDER — ONDANSETRON 4 MG PO TBDP
4.0000 mg | ORAL_TABLET | Freq: Once | ORAL | Status: AC
  Administered 2011-11-15: 4 mg via ORAL

## 2011-11-15 MED ORDER — ONDANSETRON HCL 4 MG/2ML IJ SOLN
4.0000 mg | Freq: Once | INTRAMUSCULAR | Status: AC
  Administered 2011-11-15: 4 mg via INTRAVENOUS
  Filled 2011-11-15: qty 2

## 2011-11-15 MED ORDER — ONDANSETRON 4 MG PO TBDP
ORAL_TABLET | ORAL | Status: AC
  Filled 2011-11-15: qty 1

## 2011-11-15 MED ORDER — ACETAMINOPHEN 325 MG PO TABS
650.0000 mg | ORAL_TABLET | Freq: Once | ORAL | Status: AC
  Administered 2011-11-15: 650 mg via ORAL
  Filled 2011-11-15: qty 2

## 2011-11-15 MED ORDER — LIDOCAINE-EPINEPHRINE 2 %-1:100000 IJ SOLN
20.0000 mL | Freq: Once | INTRAMUSCULAR | Status: AC
  Administered 2011-11-15: 20 mL
  Filled 2011-11-15: qty 20

## 2011-11-15 MED ORDER — SODIUM CHLORIDE 0.9 % IV SOLN
Freq: Once | INTRAVENOUS | Status: AC
  Administered 2011-11-15: 11:00:00 via INTRAVENOUS

## 2011-11-15 MED ORDER — IBUPROFEN 200 MG PO TABS
400.0000 mg | ORAL_TABLET | Freq: Once | ORAL | Status: DC
  Filled 2011-11-15: qty 2

## 2011-11-15 NOTE — ED Notes (Signed)
Patient and patient's son stated ok took take plan tylenol.

## 2011-11-15 NOTE — ED Notes (Signed)
Dawn notified about patient

## 2011-11-15 NOTE — Discharge Instructions (Signed)
Laceration Care, Adult A laceration is a cut or lesion that goes through all layers of the skin and into the tissue just beneath the skin. TREATMENT  Some lacerations may not require closure. Some lacerations may not be able to be closed due to an increased risk of infection. It is important to see your caregiver as soon as possible after an injury to minimize the risk of infection and maximize the opportunity for successful closure. If closure is appropriate, pain medicines may be given, if needed. The wound will be cleaned to help prevent infection. Your caregiver will use stitches (sutures), staples, wound glue (adhesive), or skin adhesive strips to repair the laceration. These tools bring the skin edges together to allow for faster healing and a better cosmetic outcome. However, all wounds will heal with a scar. Once the wound has healed, scarring can be minimized by covering the wound with sunscreen during the day for 1 full year. HOME CARE INSTRUCTIONS  For sutures or staples:  Keep the wound clean and dry.   If you were given a bandage (dressing), you should change it at least once a day. Also, change the dressing if it becomes wet or dirty, or as directed by your caregiver.   Wash the wound with soap and water 2 times a day. Rinse the wound off with water to remove all soap. Pat the wound dry with a clean towel.   After cleaning, apply a thin layer of the antibiotic ointment as recommended by your caregiver. This will help prevent infection and keep the dressing from sticking.   You may shower as usual after the first 24 hours. Do not soak the wound in water until the sutures are removed.   Only take over-the-counter or prescription medicines for pain, discomfort, or fever as directed by your caregiver.   Get your sutures or staples removed as directed by your caregiver.  For skin adhesive strips:  Keep the wound clean and dry.   Do not get the skin adhesive strips wet. You may bathe  carefully, using caution to keep the wound dry.   If the wound gets wet, pat it dry with a clean towel.   Skin adhesive strips will fall off on their own. You may trim the strips as the wound heals. Do not remove skin adhesive strips that are still stuck to the wound. They will fall off in time.  For wound adhesive:  You may briefly wet your wound in the shower or bath. Do not soak or scrub the wound. Do not swim. Avoid periods of heavy perspiration until the skin adhesive has fallen off on its own. After showering or bathing, gently pat the wound dry with a clean towel.   Do not apply liquid medicine, cream medicine, or ointment medicine to your wound while the skin adhesive is in place. This may loosen the film before your wound is healed.   If a dressing is placed over the wound, be careful not to apply tape directly over the skin adhesive. This may cause the adhesive to be pulled off before the wound is healed.   Avoid prolonged exposure to sunlight or tanning lamps while the skin adhesive is in place. Exposure to ultraviolet light in the first year will darken the scar.   The skin adhesive will usually remain in place for 5 to 10 days, then naturally fall off the skin. Do not pick at the adhesive film.  You may need a tetanus shot if:  You   cannot remember when you had your last tetanus shot.   You have never had a tetanus shot.  If you get a tetanus shot, your arm may swell, get red, and feel warm to the touch. This is common and not a problem. If you need a tetanus shot and you choose not to have one, there is a rare chance of getting tetanus. Sickness from tetanus can be serious. SEEK MEDICAL CARE IF:   You have redness, swelling, or increasing pain in the wound.   You see a red line that goes away from the wound.   You have yellowish-white fluid (pus) coming from the wound.   You have a fever.   You notice a bad smell coming from the wound or dressing.   Your wound breaks  open before or after sutures have been removed.   You notice something coming out of the wound such as wood or glass.   Your wound is on your hand or foot and you cannot move a finger or toe.  SEEK IMMEDIATE MEDICAL CARE IF:   Your pain is not controlled with prescribed medicine.   You have severe swelling around the wound causing pain and numbness or a change in color in your arm, hand, leg, or foot.   Your wound splits open and starts bleeding.   You have worsening numbness, weakness, or loss of function of any joint around or beyond the wound.   You develop painful lumps near the wound or on the skin anywhere on your body.  MAKE SURE YOU:   Understand these instructions.   Will watch your condition.   Will get help right away if you are not doing well or get worse.  Document Released: 08/20/2005 Document Revised: 08/09/2011 Document Reviewed: 02/13/2011 [facility] Patient Information 2012 [place], [place].Head Injury, Adult You have had a head injury that does not appear serious at this time. A concussion is a state of changed mental ability, usually from a blow to the head. You should take clear liquids for the rest of the day and then resume your regular diet. You should not take sedatives or alcoholic beverages for as long as directed by your caregiver after discharge. After injuries such as yours, most problems occur within the first 24 hours. SYMPTOMS These minor symptoms may be experienced after discharge:  Memory difficulties.   Dizziness.   Headaches.   Double vision.   Hearing difficulties.   Depression.   Tiredness.   Weakness.   Difficulty with concentration.  If you experience any of these problems, you should not be alarmed. A concussion requires a few days for recovery. Many patients with head injuries frequently experience such symptoms. Usually, these problems disappear without medical care. If symptoms last for more than one day, notify your caregiver.  See your caregiver sooner if symptoms are becoming worse rather than better. HOME CARE INSTRUCTIONS   During the next 24 hours you must stay with someone who can watch you for the warning signs listed below.  Although it is unlikely that serious side effects will occur, you should be aware of signs and symptoms which may necessitate your return to this location. Side effects may occur up to 7 - 10 days following the injury. It is important for you to carefully monitor your condition and contact your caregiver or seek immediate medical attention if there is a change in your condition. SEEK IMMEDIATE MEDICAL CARE IF:   There is confusion or drowsiness.   You can not  awaken the injured person.   There is nausea (feeling sick to your stomach) or continued, forceful vomiting.   You notice dizziness or unsteadiness which is getting worse, or inability to walk.   You have convulsions or unconsciousness.   You experience severe, persistent headaches not relieved by over-the-counter or prescription medicines for pain. (Do not take aspirin as this impairs clotting abilities). Take other pain medications only as directed.   You can not use arms or legs normally.   There is clear or bloody discharge from the nose or ears.  MAKE SURE YOU:   Understand these instructions.   Will watch your condition.   Will get help right away if you are not doing well or get worse.  Document Released: 08/20/2005 Document Revised: 08/09/2011 Document Reviewed: 07/08/2009 [facility] Patient Information 2012 [place], [place].  RESOURCE GUIDE  Dental Problems  Patients with Medicaid: [facility] [phone] [address]                                           [phone] [address] Phone:  [phone]                                                  Phone:  [phone]  If unable to pay or uninsured, contact:  [facility] or [facility]. to become  qualified for the adult dental clinic.  Chronic Pain Problems Contact [facility]  [phone] Patients need to be referred by their primary care doctor.  Insufficient Money for Medicine Contact [employer]:  call "211" or [facility] [phone].  No Primary Care Doctor Call [facility]  [phone] Other agencies that provide inexpensive medical care    [facility]  [phone]    [facility]  [phone]    [facility]  [phone]    [facility]  [phone]    [facility]  [phone]    [facility]  [phone]  Psychological Services [facility]  [phone] [facility]  [phone] [facility]   [phone] (emergency services [phone])  Substance Abuse Resources [facility]  618-671-1338 [facility] 570-611-2703 [facility] [phone] Floydene Flock [phone] Residential & Outpatient Substance Abuse Program  [phone]  Abuse/Neglect [facility] Child Abuse Hotline 510-167-9441 [facility] Child Abuse Hotline [phone] (After Hours)  Emergency Shelter [facility] (919)066-1640  M[facility] [facility] 904-652-2644 [facility] ([phone]  MRSA Hotline #:   980-086-3216    [facility] Resources  [facility]     [employer]                          [facility] Dept. [address] [place]                       [address]      [address]  [address]  Sela Hua Phone:  [phone]                                   Phone:  [phone]                 Phone:  [place] Phone:  [place] [phone] (After Hours)

## 2011-11-15 NOTE — ED Notes (Signed)
[facility] on their way

## 2011-11-15 NOTE — ED Notes (Signed)
Patient started with nausea, vomiting, diarrhea past night, several family members have had the same.  Patient reports going to toilet around 12:30 last night and on the way back to bed felt lightheaded and fell.  reports hitting head on floor.  C/o head pain across front of head, laceration to right side of head, bleeding controled.  Patient alert and oriented x 3.  Adult son reports patient is behaving at baseline.  Patient denies any new areas of body pain.  Patient has chronic pain in lower back and neck/shoulders.

## 2011-11-15 NOTE — ED Notes (Signed)
Patient states onset 0000 n/v/d. Multiple episodes fell in am laceration 3 cm bleeding controlled with bandage.  States headache 8/10 throbbing.  States feel better when laying and not moving. Ax4 airway intact bilateral equal chest rise and fall.  Son at bed side.

## 2011-11-15 NOTE — ED Notes (Signed)
Patient has not had medicine this am

## 2011-11-15 NOTE — ED Provider Notes (Signed)
History    Reviewing year-old female fall. Patient has had diarrhea and vomiting since around midnight last night. Multiple watery stools. Patient seemingly to the bathroom she felt dizzy and fell to her knees. Continued forward and struck her head against the ground. No loss of consciousness. Laceration to her for her head. Denies any significant pain aside from her headache. No acute visual changes. Denies acute numbness, tingling or loss of strength. Patient is on aspirin. Has been ambulatory since. Patient states her tetanus is current.  CSN: [id number]  Arrival date & time 11/15/11  1143   First MD Initiated Contact with Patient 11/15/11 1208      Chief Complaint  Patient presents with  . Fall  . Facial Laceration    (Consider location/radiation/quality/duration/timing/severity/associated sxs/prior treatment) HPI  Past Medical History  Diagnosis Date  . Depression   . Hyperthyroidism     hypo  . History of cardiac catheterization 2002    Negative    Past Surgical History  Procedure Date  . Back surgery     x 3  . Breast surgery     1 lump each breast removed and benign  . Nasal sinus surgery   . Dilation and curettage of uterus 1970  . Cholecystectomy 1995    Lap  . Anemia   . Stomach ulcer 2010    and 2011    No family history on file.  History  Substance Use Topics  . Smoking status: Never Smoker   . Smokeless tobacco: Not on file  . Alcohol Use: No    OB History    Grav Para Term Preterm Abortions TAB SAB Ect Mult Living                  Review of Systems   Review of symptoms negative unless otherwise noted in HPI.   Allergies  Ibuprofen; Penicillins; Demerol; Dilaudid; Lyrica; Morphine and related; Percocet; and Vicodin  Home Medications   Current Outpatient Rx  Name Route Sig Dispense Refill  . ACETAMINOPHEN 500 MG PO TABS Oral Take 1,000 mg by mouth every 6 (six) hours as needed. Usually only takes 2 x a  day    . ASPIRIN EC 81 MG PO  TBEC Oral Take 162 mg by mouth at bedtime.     Marland Kitchen CALCIUM GLUCONATE 500 MG PO TABS Oral Take 500 mg by mouth 2 (two) times daily. With 1000 units D3    . VITAMIN D 1000 UNITS PO TABS Oral Take 1,000 Units by mouth daily. Take with calium    . CRANBERRY 200 MG PO CAPS Oral Take by mouth 1 day or 1 dose.    . DENOSUMAB 60 MG/ML [place] SOLN Subcutaneous Inject 60 mg into the skin every 6 (six) months. Administer in upper arm, thigh, or abdomen    . FLAX SEED OIL 1000 MG PO CAPS Oral Take by mouth 2 (two) times daily.    Marland Kitchen GABAPENTIN 100 MG PO CAPS Oral Take 200 mg by mouth at bedtime.    Marland Kitchen LEVOTHYROXINE SODIUM 50 MCG PO TABS Oral Take 50 mcg by mouth daily.    . CENTRUM SILVER PO Oral Take by mouth 1 day or 1 dose.    Marland Kitchen ROPINIROLE HCL 0.25 MG PO TABS Oral Take 0.25 mg by mouth 3 (three) times daily.      BP 104/58  Pulse 78  Temp(Src) 97.6 F (36.4 C) (Oral)  Resp 16  SpO2 99%  Physical Exam  Nursing note  and vitals reviewed. Constitutional: She is oriented to person, place, and time. She appears well-developed and well-nourished. No distress.  HENT:  Head: Normocephalic.  Right Ear: External ear normal.       3 cm "C" shaped laceration to the right for head. The wound is gaping. Galea visualized but does not appear to be involved. Active bleeding. No significant bony tenderness.   Eyes: Conjunctivae and EOM are normal. Pupils are equal, round, and reactive to light. Right eye exhibits no discharge. Left eye exhibits no discharge.  Neck: Neck supple.  Cardiovascular: Normal rate, regular rhythm and normal heart sounds.  Exam reveals no gallop and no friction rub.   No murmur heard. Pulmonary/Chest: Effort normal and breath sounds normal. No respiratory distress.  Abdominal: Soft. She exhibits no distension. There is no tenderness.  Musculoskeletal: Normal range of motion. She exhibits no edema and no tenderness.       No midline spinal tenderness  Neurological: She is alert and oriented to  person, place, and time. No cranial nerve deficit. She exhibits normal muscle tone. Coordination normal.       Patient is alert and oriented x3. Speech is clear and content is appropriate. CN are intact. Strength is 5 upper and lower extremities. Sensation is grossly intact to light touch. Patient has good finger nose testing bilaterally. Gait is steady.   Skin: Skin is warm and dry. She is not diaphoretic.  Psychiatric: She has a normal mood and affect. Her behavior is normal. Thought content normal.    ED Course  Procedures (including critical care time)  LACERATION REPAIR Performed by: Raeford Razor Authorized by: Raeford Razor Consent: Verbal consent obtained. Risks and benefits: risks, benefits and alternatives were discussed Consent given by: patient Patient identity confirmed: provided demographic data Prepped and Draped in normal sterile fashion Wound explored  Laceration Location: forhead  Laceration Length:  3 cm  No Foreign Bodies seen or palpated  Anesthesia: local infiltration  Local anesthetic: lidocaine 2% w epinephrine  Anesthetic total: 3 ml  Irrigation method: syringe Amount of cleaning: standard  Skin closure: Subcutaneous tissue was closed with 2 5-0 chromic sutures to close dead space and better approximate skin. Skin was closed with a single running stitch with 6-0 Prolene.   Number of sutures: see above  Technique: see above  Patient tolerance: Patient tolerated the procedure well with no immediate complications.  Labs Reviewed  URINALYSIS, ROUTINE W REFLEX MICROSCOPIC - Abnormal; Notable for the following:    Color, Urine AMBER (*) BIOCHEMICALS MAY BE AFFECTED BY COLOR   Ketones, ur 15 (*)    Protein, ur 100 (*)    Leukocytes, UA TRACE (*)    All other components within normal limits  POCT I-STAT, CHEM 8 - Abnormal; Notable for the following:    BUN 29 (*)    Glucose, Bld 105 (*)    All other components within normal limits  URINE  MICROSCOPIC-ADD ON - Abnormal; Notable for the following:    Casts HYALINE CASTS (*) GRANULAR CAST   All other components within normal limits   Ct Head Wo Contrast  11/15/2011  *RADIOLOGY REPORT*  Clinical Data:  History of multiple falls complaining of head pain and neck pain.  CT HEAD WITHOUT CONTRAST CT CERVICAL SPINE WITHOUT CONTRAST  Technique:  Multidetector CT imaging of the head and cervical spine was performed following the standard protocol without intravenous contrast.  Multiplanar CT image reconstructions of the cervical spine were also generated.  Comparison:  CT head 11/05/2007.  CT HEAD  Findings: No displaced skull fractures are identified.  No definite acute intracranial abnormalities.  Specifically, no signs of post traumatic acute intracranial hemorrhage.  No focal mass, mass effect, hydrocephalus or abnormal intra or extra-axial fluid collections.  There is mild cerebral atrophy with associated ex vacuo dilatation of the ventricular system.  Punctate old lacunar infarcts are noted in the lateral aspect of the left putamen. Visualized paranasal sinuses and mastoids are well pneumatized.  IMPRESSION: 1.  No displaced skull fractures or evidence of significant acute intracranial trauma. 2.  Mild cerebral atrophy with old lacunar infarctions in the lateral aspect of the left putamen.  CT CERVICAL SPINE  Findings: No acute displaced fractures of the cervical spine are noted.  Prevertebral soft tissues are normal.  Extensive multilevel facet arthropathy is noted.  Multilevel degenerative disc disease, most pronounced at C4-C5, C5-C6 and C6-C7.  At C4-C5 there is mild anterolisthesis (3 mm) of C4 upon C5.  Visualized portions of the lung apices are unremarkable.  IMPRESSION: 1.  No radiographic evidence of significant acute traumatic injury to the cervical spine. 2.  Severe multilevel degenerative disc disease and cervical spondylosis, as detailed above, with 3 mm of anterolisthesis of C4 upon C5;  given the severity of facet arthropathy and other degenerative changes in this region, this is favored to be a chronic finding, however, if the patient has pain referable to this region and there is clinical concern for ligamentous injury, further evaluation with MRI of the C-spine may be warranted.  Original Report Authenticated By: Florencia Reasons, M.D.   Ct Cervical Spine Wo Contrast  11/15/2011  *RADIOLOGY REPORT*  Clinical Data:  History of multiple falls complaining of head pain and neck pain.  CT HEAD WITHOUT CONTRAST CT CERVICAL SPINE WITHOUT CONTRAST  Technique:  Multidetector CT imaging of the head and cervical spine was performed following the standard protocol without intravenous contrast.  Multiplanar CT image reconstructions of the cervical spine were also generated.  Comparison:  CT head 11/05/2007.  CT HEAD  Findings: No displaced skull fractures are identified.  No definite acute intracranial abnormalities.  Specifically, no signs of post traumatic acute intracranial hemorrhage.  No focal mass, mass effect, hydrocephalus or abnormal intra or extra-axial fluid collections.  There is mild cerebral atrophy with associated ex vacuo dilatation of the ventricular system.  Punctate old lacunar infarcts are noted in the lateral aspect of the left putamen. Visualized paranasal sinuses and mastoids are well pneumatized.  IMPRESSION: 1.  No displaced skull fractures or evidence of significant acute intracranial trauma. 2.  Mild cerebral atrophy with old lacunar infarctions in the lateral aspect of the left putamen.  CT CERVICAL SPINE  Findings: No acute displaced fractures of the cervical spine are noted.  Prevertebral soft tissues are normal.  Extensive multilevel facet arthropathy is noted.  Multilevel degenerative disc disease, most pronounced at C4-C5, C5-C6 and C6-C7.  At C4-C5 there is mild anterolisthesis (3 mm) of C4 upon C5.  Visualized portions of the lung apices are unremarkable.  IMPRESSION:  1.  No radiographic evidence of significant acute traumatic injury to the cervical spine. 2.  Severe multilevel degenerative disc disease and cervical spondylosis, as detailed above, with 3 mm of anterolisthesis of C4 upon C5; given the severity of facet arthropathy and other degenerative changes in this region, this is favored to be a chronic finding, however, if the patient has pain referable to this region and there is clinical concern for  ligamentous injury, further evaluation with MRI of the C-spine may be warranted.  Original Report Authenticated By: Florencia Reasons, M.D.   EKG:  Rhythm: Normal sinus rhythm w/ PACs Rate: 69 Axis: Normal axis Intervals: Poor R wave progression ST segments: Normal Comparison: Little significant change from prior EKG for comparison from 05/29/2009   1. Scalp laceration   2. Closed head injury   3. Fall       MDM  78 year old female with a closed head injury. Patient status post fall. Likely related to generalized weakness from current illness, likely viral.  BUN elevated likely from volume depletion. Labs otherwise unremarkable. IVF given. CT of the head and C-spine did not show any acute traumatic injury. Patient has a nonfocal neurological examination. Patient is at her baseline mental status per her family. Patient is not on blood thinning medication aside from 81 mg of aspirin daily. Scalp laceration was closed. Head injury instructions were discussed. Outpatient followup as needed and for suture removal.       Raeford Razor, MD 11/15/11 [id number]

## 2011-11-15 NOTE — ED Notes (Signed)
Patient last night fell middle of the night laceration 3cm forehead bleeding controlled with bandage. Have been having nausea vomiting and diarrhea.  Ax4.

## 2011-11-15 NOTE — ED Notes (Signed)
IV bag setup for nurse

## 2011-11-15 NOTE — ED Provider Notes (Signed)
History     CSN: [id number]  Arrival date & time 11/15/11  0855   None     Chief Complaint  Patient presents with  . Fall    (Consider location/radiation/quality/duration/timing/severity/associated sxs/prior treatment) HPI Comments: Linda [name] is an 78 year old patient who presents with complaints of laceration right forehead. She reports that she developed nausea, vomiting and diarrhea last night. She has had family members with same symptoms recently. She states that she vomited 4 times, but has had multiple watery stools. She began noticing dizziness with change of positions. At approximately 12:30 AM today, as she was returning to bed from having an episode of diarrhea she again began to feel dizzy. She states that she fell to her knees and then hit her head to the floor. She denies loss of consciousness. She states that the laceration is painful and she has pain across the front of her head. She has a history of chronic neck pain and states that the pain is not worsened from baseline. Her last episode of vomiting and diarrhea were approximately one hour ago. Urination is decreased, last void was approximately 2 hours ago. She is thirsty but has been unable to drink.   Past Medical History  Diagnosis Date  . Depression   . Hyperthyroidism     hypo  . History of cardiac catheterization 2002    Negative    Past Surgical History  Procedure Date  . Back surgery     x 3  . Breast surgery     1 lump each breast removed and benign  . Nasal sinus surgery   . Dilation and curettage of uterus 1970  . Cholecystectomy 1995    Lap  . Anemia   . Stomach ulcer 2010    and 2011    History reviewed. No pertinent family history.  History  Substance Use Topics  . Smoking status: Never Smoker   . Smokeless tobacco: Not on file  . Alcohol Use: No    OB History    Grav Para Term Preterm Abortions TAB SAB Ect Mult Living                  Review of Systems  Constitutional: Positive  for appetite change. Negative for fever and chills.  HENT: Positive for neck pain. Negative for neck stiffness.   Respiratory: Negative for cough and shortness of breath.   Cardiovascular: Negative for chest pain.  Gastrointestinal: Positive for nausea, vomiting and diarrhea. Negative for abdominal pain and blood in stool.  Genitourinary: Positive for decreased urine volume. Negative for dysuria and frequency.  Neurological: Positive for dizziness and headaches.    Allergies  Ibuprofen and Penicillins  Home Medications   Current Outpatient Rx  Name Route Sig Dispense Refill  . ACETAMINOPHEN 500 MG PO TABS Oral Take 500 mg by mouth every 6 (six) hours as needed. Usually only takes 2 x a  day    . ASPIRIN EC 81 MG PO TBEC Oral Take 81 mg by mouth daily.    Marland Kitchen CALCIUM GLUCONATE 500 MG PO TABS Oral Take 500 mg by mouth 2 (two) times daily. With 1000 units D3    . CRANBERRY 200 MG PO CAPS Oral Take by mouth 1 day or 1 dose.    . DENOSUMAB 60 MG/ML [place] SOLN Subcutaneous Inject 60 mg into the skin every 6 (six) months. Administer in upper arm, thigh, or abdomen    . FLAX SEED OIL 1000 MG PO CAPS Oral Take by  mouth 2 (two) times daily.    Marland Kitchen GABAPENTIN 100 MG PO CAPS Oral Take 200 mg by mouth at bedtime.    Marland Kitchen LEVOTHYROXINE SODIUM 50 MCG PO TABS Oral Take 50 mcg by mouth daily.    . CENTRUM SILVER PO Oral Take by mouth 1 day or 1 dose.    Marland Kitchen ROPINIROLE HCL 0.25 MG PO TABS Oral Take 0.25 mg by mouth 3 (three) times daily.      BP 50/24  Pulse 95  Temp(Src) 97.5 F (36.4 C) (Oral)  Resp 20  SpO2 100%  Physical Exam  Constitutional: She appears well-developed and well-nourished. No distress.  HENT:  Head: Normocephalic. Head is with laceration.    Right Ear: Tympanic membrane, external ear and ear canal normal.  Left Ear: Tympanic membrane, external ear and ear canal normal.  Nose: Nose normal.  Mouth/Throat: Uvula is midline and oropharynx is clear and moist. Mucous membranes are dry. No  oropharyngeal exudate, posterior oropharyngeal edema or posterior oropharyngeal erythema.  Neck: Neck supple.  Cardiovascular: Normal rate, regular rhythm and normal heart sounds.   Pulmonary/Chest: Effort normal and breath sounds normal. No respiratory distress.  Musculoskeletal:       Cervical back: She exhibits tenderness (bilat paraspinal muscles) and bony tenderness (C2-3 spinous processes, pt reports chronic and unchanged). She exhibits normal range of motion, no swelling, no deformity, no laceration and no spasm.  Lymphadenopathy:    She has no cervical adenopathy.  Neurological: She is alert.  Skin: Skin is warm and dry.  Psychiatric: She has a normal mood and affect.    ED Course  Procedures (including critical care time)  Labs Reviewed - No data to display No results found.   1. Dehydration   2. Gastroenteritis   3. Laceration of forehead       MDM   Pt transferred to [facility].         Melody Comas, [place] 11/15/11 1051

## 2011-11-15 NOTE — ED Notes (Signed)
Patient is resting comfortably. 

## 2011-11-16 NOTE — ED Provider Notes (Signed)
Medical screening examination/treatment/procedure(s) were performed by non-physician practitioner and as supervising physician I was immediately available for consultation/collaboration.  Virginie Josten   Nicola Quesnell, MD 11/16/11 0750 

## 2011-11-23 DIAGNOSIS — Z4802 Encounter for removal of sutures: Secondary | ICD-10-CM | POA: Diagnosis not present

## 2012-02-22 DIAGNOSIS — Z0000 Encounter for general adult medical examination without abnormal findings: Secondary | ICD-10-CM | POA: Diagnosis not present

## 2012-02-22 DIAGNOSIS — M810 Age-related osteoporosis without current pathological fracture: Secondary | ICD-10-CM | POA: Diagnosis not present

## 2012-02-22 DIAGNOSIS — Z7902 Long term (current) use of antithrombotics/antiplatelets: Secondary | ICD-10-CM | POA: Diagnosis not present

## 2012-02-22 DIAGNOSIS — E039 Hypothyroidism, unspecified: Secondary | ICD-10-CM | POA: Diagnosis not present

## 2012-02-28 DIAGNOSIS — G2581 Restless legs syndrome: Secondary | ICD-10-CM | POA: Diagnosis not present

## 2012-02-28 DIAGNOSIS — G20 Parkinson's disease: Secondary | ICD-10-CM | POA: Diagnosis not present

## 2012-02-28 DIAGNOSIS — M810 Age-related osteoporosis without current pathological fracture: Secondary | ICD-10-CM | POA: Diagnosis not present

## 2012-02-28 DIAGNOSIS — Z8669 Personal history of other diseases of the nervous system and sense organs: Secondary | ICD-10-CM | POA: Diagnosis not present

## 2012-03-10 ENCOUNTER — Other Ambulatory Visit: Payer: Self-pay | Admitting: Internal Medicine

## 2012-03-10 DIAGNOSIS — R109 Unspecified abdominal pain: Secondary | ICD-10-CM

## 2012-03-13 ENCOUNTER — Ambulatory Visit
Admission: RE | Admit: 2012-03-13 | Discharge: 2012-03-13 | Disposition: A | Discharge status: Home / Self Care | Payer: Medicare Other | Source: Ambulatory Visit | Attending: Internal Medicine | Admitting: Internal Medicine

## 2012-03-13 DIAGNOSIS — Z9089 Acquired absence of other organs: Secondary | ICD-10-CM | POA: Diagnosis not present

## 2012-03-13 DIAGNOSIS — R109 Unspecified abdominal pain: Secondary | ICD-10-CM

## 2012-03-13 DIAGNOSIS — R1011 Right upper quadrant pain: Secondary | ICD-10-CM | POA: Diagnosis not present

## 2012-04-15 DIAGNOSIS — M810 Age-related osteoporosis without current pathological fracture: Secondary | ICD-10-CM | POA: Diagnosis not present

## 2012-04-15 DIAGNOSIS — M545 Low back pain: Secondary | ICD-10-CM | POA: Diagnosis not present

## 2012-04-29 DIAGNOSIS — E039 Hypothyroidism, unspecified: Secondary | ICD-10-CM | POA: Diagnosis not present

## 2012-04-29 DIAGNOSIS — R1084 Generalized abdominal pain: Secondary | ICD-10-CM | POA: Diagnosis not present

## 2012-04-29 DIAGNOSIS — G2581 Restless legs syndrome: Secondary | ICD-10-CM | POA: Diagnosis not present

## 2012-05-01 DIAGNOSIS — M545 Low back pain: Secondary | ICD-10-CM | POA: Diagnosis not present

## 2012-05-06 ENCOUNTER — Other Ambulatory Visit (HOSPITAL_COMMUNITY): Payer: Self-pay | Admitting: *Deleted

## 2012-05-09 ENCOUNTER — Encounter (HOSPITAL_COMMUNITY)
Admission: RE | Admit: 2012-05-09 | Discharge: 2012-05-09 | Disposition: A | Discharge status: Home / Self Care | Payer: Medicare Other | Source: Ambulatory Visit | Attending: Internal Medicine | Admitting: Internal Medicine

## 2012-05-09 DIAGNOSIS — M810 Age-related osteoporosis without current pathological fracture: Secondary | ICD-10-CM | POA: Insufficient documentation

## 2012-05-09 MED ORDER — DENOSUMAB 60 MG/ML ~~LOC~~ SOLN
60.0000 mg | Freq: Once | SUBCUTANEOUS | Status: AC
  Administered 2012-05-09: 60 mg via SUBCUTANEOUS
  Filled 2012-05-09: qty 1

## 2012-05-12 DIAGNOSIS — R1084 Generalized abdominal pain: Secondary | ICD-10-CM | POA: Diagnosis not present

## 2012-05-12 DIAGNOSIS — M545 Low back pain: Secondary | ICD-10-CM | POA: Diagnosis not present

## 2012-05-12 DIAGNOSIS — M940 Chondrocostal junction syndrome [Tietze]: Secondary | ICD-10-CM | POA: Diagnosis not present

## 2012-05-21 DIAGNOSIS — M545 Low back pain: Secondary | ICD-10-CM | POA: Diagnosis not present

## 2012-05-21 DIAGNOSIS — M810 Age-related osteoporosis without current pathological fracture: Secondary | ICD-10-CM | POA: Diagnosis not present

## 2012-05-28 DIAGNOSIS — G20 Parkinson's disease: Secondary | ICD-10-CM | POA: Diagnosis not present

## 2012-05-28 DIAGNOSIS — Z23 Encounter for immunization: Secondary | ICD-10-CM | POA: Diagnosis not present

## 2012-05-28 DIAGNOSIS — G44209 Tension-type headache, unspecified, not intractable: Secondary | ICD-10-CM | POA: Diagnosis not present

## 2012-06-12 DIAGNOSIS — M5030 Other cervical disc degeneration, unspecified cervical region: Secondary | ICD-10-CM | POA: Diagnosis not present

## 2012-06-16 ENCOUNTER — Other Ambulatory Visit: Payer: Self-pay | Admitting: Pain Medicine

## 2012-06-16 DIAGNOSIS — M542 Cervicalgia: Secondary | ICD-10-CM

## 2012-06-26 ENCOUNTER — Ambulatory Visit
Admission: RE | Admit: 2012-06-26 | Discharge: 2012-06-26 | Disposition: A | Discharge status: Home / Self Care | Payer: Medicare Other | Source: Ambulatory Visit | Attending: Pain Medicine | Admitting: Pain Medicine

## 2012-06-26 DIAGNOSIS — M47812 Spondylosis without myelopathy or radiculopathy, cervical region: Secondary | ICD-10-CM | POA: Diagnosis not present

## 2012-06-26 DIAGNOSIS — M542 Cervicalgia: Secondary | ICD-10-CM

## 2012-07-07 DIAGNOSIS — Z1231 Encounter for screening mammogram for malignant neoplasm of breast: Secondary | ICD-10-CM | POA: Diagnosis not present

## 2012-07-07 DIAGNOSIS — M810 Age-related osteoporosis without current pathological fracture: Secondary | ICD-10-CM | POA: Diagnosis not present

## 2012-07-09 DIAGNOSIS — J310 Chronic rhinitis: Secondary | ICD-10-CM | POA: Diagnosis not present

## 2012-07-09 DIAGNOSIS — M810 Age-related osteoporosis without current pathological fracture: Secondary | ICD-10-CM | POA: Diagnosis not present

## 2012-07-10 DIAGNOSIS — M5030 Other cervical disc degeneration, unspecified cervical region: Secondary | ICD-10-CM | POA: Diagnosis not present

## 2012-08-07 DIAGNOSIS — M5030 Other cervical disc degeneration, unspecified cervical region: Secondary | ICD-10-CM | POA: Diagnosis not present

## 2012-08-21 DIAGNOSIS — Z01419 Encounter for gynecological examination (general) (routine) without abnormal findings: Secondary | ICD-10-CM | POA: Diagnosis not present

## 2012-09-01 DIAGNOSIS — R42 Dizziness and giddiness: Secondary | ICD-10-CM | POA: Diagnosis not present

## 2012-09-04 DIAGNOSIS — G894 Chronic pain syndrome: Secondary | ICD-10-CM | POA: Diagnosis not present

## 2012-09-04 DIAGNOSIS — R51 Headache: Secondary | ICD-10-CM | POA: Diagnosis not present

## 2012-09-04 DIAGNOSIS — M542 Cervicalgia: Secondary | ICD-10-CM | POA: Diagnosis not present

## 2012-09-04 DIAGNOSIS — M5030 Other cervical disc degeneration, unspecified cervical region: Secondary | ICD-10-CM | POA: Diagnosis not present

## 2012-10-14 DIAGNOSIS — M545 Low back pain: Secondary | ICD-10-CM | POA: Diagnosis not present

## 2012-10-14 DIAGNOSIS — M810 Age-related osteoporosis without current pathological fracture: Secondary | ICD-10-CM | POA: Diagnosis not present

## 2012-10-27 DIAGNOSIS — Z961 Presence of intraocular lens: Secondary | ICD-10-CM | POA: Diagnosis not present

## 2012-11-05 ENCOUNTER — Other Ambulatory Visit (HOSPITAL_COMMUNITY): Payer: Self-pay | Admitting: Internal Medicine

## 2012-11-07 ENCOUNTER — Ambulatory Visit (HOSPITAL_COMMUNITY): Admission: RE | Admit: 2012-11-07 | Payer: Medicare Other | Source: Ambulatory Visit

## 2012-11-11 ENCOUNTER — Encounter (HOSPITAL_COMMUNITY): Payer: Self-pay

## 2012-11-11 ENCOUNTER — Ambulatory Visit (HOSPITAL_COMMUNITY)
Admission: RE | Admit: 2012-11-11 | Discharge: 2012-11-11 | Disposition: A | Discharge status: Home / Self Care | Payer: Medicare Other | Source: Ambulatory Visit | Attending: Internal Medicine | Admitting: Internal Medicine

## 2012-11-11 DIAGNOSIS — M810 Age-related osteoporosis without current pathological fracture: Secondary | ICD-10-CM | POA: Diagnosis not present

## 2012-11-11 MED ORDER — DENOSUMAB 60 MG/ML ~~LOC~~ SOLN
60.0000 mg | Freq: Once | SUBCUTANEOUS | Status: AC
  Administered 2012-11-11: 60 mg via SUBCUTANEOUS
  Filled 2012-11-11: qty 1

## 2012-11-28 DIAGNOSIS — L820 Inflamed seborrheic keratosis: Secondary | ICD-10-CM | POA: Diagnosis not present

## 2012-11-28 DIAGNOSIS — L570 Actinic keratosis: Secondary | ICD-10-CM | POA: Diagnosis not present

## 2013-01-05 DIAGNOSIS — E039 Hypothyroidism, unspecified: Secondary | ICD-10-CM | POA: Diagnosis not present

## 2013-03-26 DIAGNOSIS — E039 Hypothyroidism, unspecified: Secondary | ICD-10-CM | POA: Diagnosis not present

## 2013-03-26 DIAGNOSIS — Z006 Encounter for examination for normal comparison and control in clinical research program: Secondary | ICD-10-CM | POA: Diagnosis not present

## 2013-03-26 DIAGNOSIS — Z0000 Encounter for general adult medical examination without abnormal findings: Secondary | ICD-10-CM | POA: Diagnosis not present

## 2013-03-26 DIAGNOSIS — M810 Age-related osteoporosis without current pathological fracture: Secondary | ICD-10-CM | POA: Diagnosis not present

## 2013-03-26 DIAGNOSIS — M47812 Spondylosis without myelopathy or radiculopathy, cervical region: Secondary | ICD-10-CM | POA: Diagnosis not present

## 2013-04-01 DIAGNOSIS — M545 Low back pain: Secondary | ICD-10-CM | POA: Diagnosis not present

## 2013-04-01 DIAGNOSIS — E875 Hyperkalemia: Secondary | ICD-10-CM | POA: Diagnosis not present

## 2013-04-01 DIAGNOSIS — J310 Chronic rhinitis: Secondary | ICD-10-CM | POA: Diagnosis not present

## 2013-04-01 DIAGNOSIS — Z0000 Encounter for general adult medical examination without abnormal findings: Secondary | ICD-10-CM | POA: Diagnosis not present

## 2013-04-01 DIAGNOSIS — M47817 Spondylosis without myelopathy or radiculopathy, lumbosacral region: Secondary | ICD-10-CM | POA: Diagnosis not present

## 2013-04-01 DIAGNOSIS — M810 Age-related osteoporosis without current pathological fracture: Secondary | ICD-10-CM | POA: Diagnosis not present

## 2013-04-01 DIAGNOSIS — M4000 Postural kyphosis, site unspecified: Secondary | ICD-10-CM | POA: Diagnosis not present

## 2013-04-01 DIAGNOSIS — M4120 Other idiopathic scoliosis, site unspecified: Secondary | ICD-10-CM | POA: Diagnosis not present

## 2013-04-13 DIAGNOSIS — M810 Age-related osteoporosis without current pathological fracture: Secondary | ICD-10-CM | POA: Diagnosis not present

## 2013-04-13 DIAGNOSIS — IMO0002 Reserved for concepts with insufficient information to code with codable children: Secondary | ICD-10-CM | POA: Diagnosis not present

## 2013-04-24 ENCOUNTER — Other Ambulatory Visit: Payer: Self-pay | Admitting: Neurology

## 2013-05-12 ENCOUNTER — Other Ambulatory Visit (HOSPITAL_COMMUNITY): Payer: Self-pay | Admitting: Internal Medicine

## 2013-05-12 ENCOUNTER — Ambulatory Visit (HOSPITAL_COMMUNITY)
Admission: RE | Admit: 2013-05-12 | Discharge: 2013-05-12 | Disposition: A | Discharge status: Home / Self Care | Payer: Medicare Other | Source: Ambulatory Visit | Attending: Internal Medicine | Admitting: Internal Medicine

## 2013-05-12 ENCOUNTER — Encounter: Payer: Self-pay | Admitting: Neurology

## 2013-05-12 ENCOUNTER — Encounter (HOSPITAL_COMMUNITY): Payer: Self-pay

## 2013-05-12 DIAGNOSIS — M810 Age-related osteoporosis without current pathological fracture: Secondary | ICD-10-CM | POA: Insufficient documentation

## 2013-05-12 MED ORDER — DENOSUMAB 60 MG/ML ~~LOC~~ SOLN
60.0000 mg | Freq: Once | SUBCUTANEOUS | Status: AC
  Administered 2013-05-12: 60 mg via SUBCUTANEOUS
  Filled 2013-05-12: qty 1

## 2013-05-13 ENCOUNTER — Ambulatory Visit (INDEPENDENT_AMBULATORY_CARE_PROVIDER_SITE_OTHER): Payer: Medicare Other | Admitting: Neurology

## 2013-05-13 ENCOUNTER — Encounter: Payer: Self-pay | Admitting: Neurology

## 2013-05-13 VITALS — BP 134/73 | HR 67 | Resp 16 | Ht 63.0 in | Wt 101.5 lb

## 2013-05-13 DIAGNOSIS — G2581 Restless legs syndrome: Secondary | ICD-10-CM

## 2013-05-13 DIAGNOSIS — G253 Myoclonus: Secondary | ICD-10-CM

## 2013-05-13 MED ORDER — ROPINIROLE HCL 0.25 MG PO TABS: 0.2500 mg | ORAL_TABLET | Freq: Three times a day (TID) | ORAL | Status: DC

## 2013-05-13 NOTE — Patient Instructions (Signed)
Place essential tremor patient , RLS  Restless Legs Syndrome Restless legs syndrome is a movement disorder. It may also be called a sensori-motor disorder.  CAUSES  No one knows what specifically causes restless legs syndrome, but it tends to run in families. It is also more common in people with low iron, in pregnancy, in people who need dialysis, and those with nerve damage (neuropathy).Some medications may make restless legs syndrome worse.Those medications include drugs to treat high blood pressure, some heart conditions, nausea, colds, allergies, and depression. SYMPTOMS Symptoms include uncomfortable sensations in the legs. These leg sensations are worse during periods of inactivity or rest. They are also worse while sitting or lying down. Individuals that have the disorder describe sensations in the legs that feel like:  Pulling.  Drawing.  Crawling.  Worming.  Boring.  Tingling.  Pins and needles.  Prickling.  Pain. The sensations are usually accompanied by an overwhelming urge to move the legs. Sudden muscle jerks may also occur. Movement provides temporary relief from the discomfort. In rare cases, the arms may also be affected. Symptoms may interfere with going to sleep (sleep onset insomnia). Restless legs syndrome may also be related to periodic limb movement disorder (PLMD). PLMD is another more common motor disorder. It also causes interrupted sleep. The symptoms from PLMD usually occur most often when you are awake. TREATMENT  Treatment for restless legs syndrome is symptomatic. This means that the symptoms are treated.   Massage and cold compresses may provide temporary relief.  Walk, stretch, or take a cold or hot bath.  Get regular exercise and a good night's sleep.  Avoid caffeine, alcohol, nicotine, and medications that can make it worse.  Do activities that provide mental stimulation like discussions, needlework, and video games. These may be helpful if  you are not able to walk or stretch. Some medications are effective in relieving the symptoms. However, many of these medications have side effects. Ask your caregiver about medications that may help your symptoms. Correcting iron deficiency may improve symptoms for some patients. Document Released: 08/10/2002 Document Revised: 11/12/2011 Document Reviewed: 11/16/2010 [facility] Patient Information 2014 [place], [place].

## 2013-05-13 NOTE — Progress Notes (Signed)
[facility]  Provider:  Melvyn Novas, M D  Referring Provider: Donnetta Hail, MD Primary Care Physician:  Londell Moh, MD  Chief Complaint  Patient presents with  . Follow-up    RLS,rm 10    HPI:  Linda [name] is a 79 y.o. female  Is seen here as a referral/ revisit  from Dr. Dierdre Forth for  Restless legs.  This patient was originally seen in 2011 for tremors and rest lesss legs, now controlled on Requip. She developed no either compulsive behaviors nor  Visual hallucinations , but feels fatigued.   She reports that Dr. Renne Crigler, primary care physician had asked her to reduce her Requip and then slowly gradually increase it again to see if the effectiveness of restless legs was still present and also to see as side effects were caused by this medication.  The patient had in the past tried Lyrica and could not tolerate it.  She states that she has returned to a dose of Requip  0.25 mg tid with sufficient control of symptoms. She does not use an assistive device she is driving without difficulties she was able the summer to garden outside. Since 2013 she's also been taking gabapentin for headaches. Headaches are localized to the back of the have been described as a sharp quality but not associated with nausea, vision changes or dizziness. The patient has a history of compression fractures and chronic back pain she had undergone a kyphoplasty after MRI confirmed the compression fractures and has been undergoing epidural injections for back pain. Her MRIs were done to [facility] imaging and are accessible of the Epic. The patient shared with me that she walks best in her garden by using a howe to lean on with its flat surface giving a better,  more stable support than a cane  or a walker could ever do. She felt frequently dizzy in 2010 and this let to falls, she stated. Every time she had a sinus infection, and epistaxis . She began using saline nose spray and after 14  days she cleared and had none of the dizziness spells or falls  either.     The patient answered the [facility]  Questionnaire for RLS, and rated it  at mild impairment of life quality.   Falls - none in 12 month. Fall risk assessment 7 points.  FSS 38 , GDS 3 points.               Review of Systems: Out of a complete 14 system review, the patient complains of only the following symptoms, and all other reviewed systems are negative. No dizziness, some urinary incontinence at night, 2 3-times at  Nocturia with urge -, snore, no apnea, no  Tremors.    History   Social History  . Marital Status: Widowed    Spouse Name: N/A    Number of Children: 3  . Years of Education: some coll.   Occupational History  . retired     Museum/gallery conservator position   Social History Main Topics  . Smoking status: Never Smoker   . Smokeless tobacco: Never Used  . Alcohol Use: No  . Drug Use: No  . Sexual Activity: Not on file   Other Topics Concern  . Not on file   Social History Narrative  . No narrative on file    History reviewed. No pertinent family history.  Past Medical History  Diagnosis Date  . Depression   . Hyperthyroidism  hypo  . History of cardiac catheterization 2002    Negative  . Heart disease   . Pain     chronic  . RLS (restless legs syndrome)   . Headache(784.0)   . Vertebral fracture   . Restless legs syndrome with nocturnal myoclonus 05/13/2013     requip controlled     Past Surgical History  Procedure Laterality Date  . Back surgery      x 3  . Breast surgery Bilateral     1 lump each breast removed and benign  . Nasal sinus surgery    . Dilation and curettage of uterus  1970  . Cholecystectomy  1995    Lap  . Anemia    . Stomach ulcer  2010    and 2011  . Catheterization  2002  . Gastric bypass    . Ulcers  2009    anemia, due to bleed  . Kyphoplasty  2010  . Cataract extraction Bilateral 2011    Current Outpatient Prescriptions   Medication Sig Dispense Refill  . acetaminophen (TYLENOL) 500 MG tablet Take 1,000 mg by mouth every 6 (six) hours as needed. Usually only takes 2 x a  day      . aspirin EC 81 MG tablet Take 162 mg by mouth at bedtime.       . calcium gluconate 500 MG tablet Take 500 mg by mouth 2 (two) times daily. With 1000 units D3      . cholecalciferol (VITAMIN D) 1000 UNITS tablet Take 1,000 Units by mouth daily. Take with calium      . Cranberry 200 MG CAPS Take by mouth 1 day or 1 dose.      Marland Kitchen denosumab (PROLIA) 60 MG/ML SOLN injection Inject 60 mg into the skin every 6 (six) months. Administer in upper arm, thigh, or abdomen      . Flaxseed, Linseed, (FLAX SEED OIL) 1000 MG CAPS Take by mouth 2 (two) times daily.      Marland Kitchen gabapentin (NEURONTIN) 100 MG capsule Take 200 mg by mouth at bedtime.      Marland Kitchen levothyroxine (SYNTHROID, LEVOTHROID) 50 MCG tablet Take 50 mcg by mouth daily.      . Multiple Vitamins-Minerals (CENTRUM SILVER PO) Take by mouth 1 day or 1 dose.      Marland Kitchen rOPINIRole (REQUIP) 0.25 MG tablet USE AT 6:30 AM, 12:30P AND 4:30WILL USE [facility] MAIL ORDER  270 tablet  0  . ipratropium (ATROVENT) 0.06 % nasal spray       . meclizine (ANTIVERT) 25 MG tablet       . ZOSTAVAX 84132 UNT/0.65ML injection        No current facility-administered medications for this visit.    Allergies as of 05/13/2013 - Review Complete 05/13/2013  Allergen Reaction Noted  . Ibuprofen Anaphylaxis 11/07/2011  . Penicillins  11/07/2011  . Demerol [meperidine hcl] Nausea And Vomiting 11/15/2011  . Dilaudid [hydromorphone hcl] Nausea And Vomiting 11/15/2011  . Lyrica [pregabalin]  11/15/2011  . Morphine and related Nausea And Vomiting 11/15/2011  . Percocet [oxycodone-acetaminophen] Nausea And Vomiting 11/15/2011  . Vicodin [hydrocodone-acetaminophen] Nausea And Vomiting 11/15/2011    Vitals: BP 134/73  Pulse 67  Resp 16  Ht 5\' 3"  (1.6 m)  Wt 101 lb 8 oz (46.04 kg)  BMI 17.98 kg/m2 Last Weight:  Wt Readings from  Last 1 Encounters:  05/13/13 101 lb 8 oz (46.04 kg)   Last Height:   Ht Readings from Last 1 Encounters:  05/13/13 5\' 3"  (1.6 m)   Physical exam:  General: The patient is awake, alert and appears not in acute distress. The patient is well groomed. Head: Normocephalic, atraumatic. Neck is supple. Mallampati 2 , neck circumference:13 inches , no nasal deviation, no TMJ. No surgery to upper airway or nasal septum.  Cardiovascular:  Regular rate and rhythm, without  murmurs or carotid bruit, and without distended neck veins. Respiratory: Lungs are clear to auscultation. Skin:  Without evidence of edema, or rash Trunk: BMI is normal, and normal  Posture. She has developed a slight hump.   Neurologic exam : The patient is awake and alert, oriented to place and time.  Memory subjective  described as intact. There is a normal attention span & concentration ability. Speech is fluent with dysphonia , not  aphasia. Mood and affect are appropriate.  Cranial nerves: Pupils are equal and briskly reactive to light. Funduscopic exam without  evidence of pallor or edema. Cataract: 2011 bilateral replaced lens.   Extraocular movements  in vertical and horizontal planes intact and without nystagmus. Visual fields by finger perimetry are intact. Hearing to finger rub intact.  Facial sensation intact to fine touch. Facial motor strength is symmetric and tongue and uvula move midline.  Motor exam:  Normal tone and normal muscle bulk and symmetric normal strength in all extremities.  Sensory:  Fine touch, pinprick and vibration were tested in all extremities. Proprioception is  normal.  Coordination: Rapid alternating movements in the fingers/hands is tested and normal. Finger-to-nose maneuver tested and normal with a very mild bilateral  tremor.  Gait and station: Patient walks without assistive device . Strength within normal limits for age . Stance is stable and normal. Tandem gait is deferred - Steps are  unfragmented. Romberg testing is normal.  Deep tendon reflexes: in the  upper and lower extremities are symmetric and intact. Babinski maneuver downgoing.   Assessment:  After physical and neurologic examination, review of laboratory studies, imaging, neurophysiology testing and pre-existing records, assessment is that of a patient with osteoporotic kyphotic changes, essential tremor and restless legs - no cog wheeling or other signs of  parkinsonism. The patient may continue on Requip. 0.25 mg tid po.

## 2013-06-04 DIAGNOSIS — Z23 Encounter for immunization: Secondary | ICD-10-CM | POA: Diagnosis not present

## 2013-07-09 ENCOUNTER — Other Ambulatory Visit: Payer: Self-pay

## 2013-07-09 DIAGNOSIS — Z1231 Encounter for screening mammogram for malignant neoplasm of breast: Secondary | ICD-10-CM | POA: Diagnosis not present

## 2013-07-22 ENCOUNTER — Other Ambulatory Visit: Payer: Self-pay | Admitting: Neurology

## 2013-09-14 ENCOUNTER — Encounter: Payer: Self-pay | Admitting: Certified Nurse Midwife

## 2013-09-16 ENCOUNTER — Ambulatory Visit (INDEPENDENT_AMBULATORY_CARE_PROVIDER_SITE_OTHER): Payer: Medicare Other | Admitting: Certified Nurse Midwife

## 2013-09-16 ENCOUNTER — Encounter: Payer: Self-pay | Admitting: Certified Nurse Midwife

## 2013-09-16 VITALS — BP 110/72 | HR 68 | Resp 16 | Ht 63.25 in | Wt 104.0 lb

## 2013-09-16 DIAGNOSIS — Z124 Encounter for screening for malignant neoplasm of cervix: Secondary | ICD-10-CM

## 2013-09-16 DIAGNOSIS — Z01419 Encounter for gynecological examination (general) (routine) without abnormal findings: Secondary | ICD-10-CM

## 2013-09-16 NOTE — Progress Notes (Signed)
80 y.o. G18P3003 Widowed Caucasian Fe here for annual exam. Menopausal no HRT Denies vaginal bleeding or vaginal dryness. Continues to have slight spontaneous incontinence only in afternoon. No problems at night.Drinks most of her fluid in am. Sees PCP twice yearly, for labs and Prolia management, and aex. Son living with her and assists as needed. Patient still putting a garden in the ground yearly!! No health issues today.  Patient's last menstrual period was 09/03/1996.          Sexually active: no  The current method of family planning is post menopausal status.    Exercising: yes  gardening,walking,stairs Smoker:  no  Health Maintenance: Pap:  08-02-10 neg MMG:  07-09-13 neg Colonoscopy:  2010 10 years BMD:   2013 TDaP:  2010 Labs: none Self breast exam: done occ   reports that she has never smoked. She has never used smokeless tobacco. She reports that she does not drink alcohol or use illicit drugs.  Past Medical History  Diagnosis Date  . Depression   . History of cardiac catheterization 2002    Negative  . Heart disease   . Pain     chronic  . RLS (restless legs syndrome)   . Headache(784.0)   . Vertebral fracture   . Restless legs syndrome with nocturnal myoclonus 05/13/2013     requip controlled   . Dense breast   . Abnormal uterine bleeding 6789,3810  . Osteoporosis   . Hepatitis     age 51  . Stomach ulcer   . Hypothyroidism     Past Surgical History  Procedure Laterality Date  . Back surgery      x 3  . Breast surgery Bilateral     1 lump each breast removed and benign  . Nasal sinus surgery    . Dilation and curettage of uterus  1970  . Cholecystectomy  1995    Lap  . Anemia      per pt, she states she doesnt remember that.maybe yrs ago  . Stomach ulcer  2010    and 2011  . Catheterization  2003    cardiac cath  . Gastric bypass    . Ulcers  2009    anemia, due to bleed  . Kyphoplasty  2010  . Cataract extraction Bilateral 2011  . Abdominal  hysterectomy  10/1994    Current Outpatient Prescriptions  Medication Sig Dispense Refill  . acetaminophen (TYLENOL) 500 MG tablet Take 1,000 mg by mouth every 6 (six) hours as needed. Usually only takes 2 x a  day      . aspirin EC 81 MG tablet Take 162 mg by mouth at bedtime.       . calcium gluconate 500 MG tablet Take 500 mg by mouth 2 (two) times daily. With 1000 units D3      . cholecalciferol (VITAMIN D) 1000 UNITS tablet Take 1,000 Units by mouth daily. Take with calium      . Cranberry 200 MG CAPS Take by mouth 1 day or 1 dose.      Marland Kitchen denosumab (PROLIA) 60 MG/ML SOLN injection Inject 60 mg into the skin every 6 (six) months. Administer in upper arm, thigh, or abdomen      . Flaxseed, Linseed, (FLAX SEED OIL) 1000 MG CAPS Take by mouth 2 (two) times daily.      Marland Kitchen gabapentin (NEURONTIN) 100 MG capsule Take 200 mg by mouth at bedtime.      Marland Kitchen ipratropium (ATROVENT) 0.06 %  nasal spray       . levothyroxine (SYNTHROID, LEVOTHROID) 50 MCG tablet Take 50 mcg by mouth daily.      . meclizine (ANTIVERT) 25 MG tablet       . Multiple Vitamins-Minerals (CENTRUM SILVER PO) Take by mouth 1 day or 1 dose.      Marland Kitchen rOPINIRole (REQUIP) 0.25 MG tablet Take 1 tablet (0.25 mg total) by mouth 3 (three) times daily.  270 tablet  0   No current facility-administered medications for this visit.    Family History  Problem Relation Age of Onset  . Asthma Brother   . Osteoarthritis Son   . Hypertension Father   . Stroke Father   . Cancer Brother     stomach    ROS:  Pertinent items are noted in HPI.  Otherwise, a comprehensive ROS was negative.  Exam:   BP 110/72  Pulse 68  Resp 16  Ht 5' 3.25" (1.607 m)  Wt 104 lb (47.174 kg)  BMI 18.27 kg/m2  LMP 09/03/1996 Height: 5' 3.25" (160.7 cm)  Ht Readings from Last 3 Encounters:  09/16/13 5' 3.25" (1.607 m)  05/13/13 5\' 3"  (1.6 m)  05/12/13 5\' 3"  (1.6 m)    General appearance: alert, cooperative and appears stated age Patient ambulates with out  any assistance. Dowagers hump present with osteoporosis changes. Head: Normocephalic, without obvious abnormality, atraumatic Neck: no adenopathy, supple, symmetrical, trachea midline and thyroid normal to inspection and palpation Lungs: clear to auscultation bilaterally Breasts: normal appearance, no masses or tenderness, No nipple retraction or dimpling, No nipple discharge or bleeding, No axillary or supraclavicular adenopathy Heart: regular rate and rhythm Abdomen: soft, non-tender; no masses,  no organomegaly Extremities: extremities normal, atraumatic, no cyanosis or edema Skin: Skin color, texture, turgor normal. No rashes or lesions Lymph nodes: Cervical, supraclavicular, and axillary nodes normal. No abnormal inguinal nodes palpated Neurologic: Grossly normal   Pelvic: External genitalia:  no lesions              Urethra:  normal appearing urethra with no masses, tenderness or lesions  Urethral meatus: no prolapse or tenderness, no leaking with cough or bearing down  Bladder non tender              Bartholin's and Skene's: normal                 Vagina:atrophic appearing vagina with normal color and discharge, no lesions              Cervix: normal, non tender              Pap taken: no Bimanual Exam:  Uterus:  normal size, contour, position, consistency, mobility, non-tender and anteverted              Adnexa: normal adnexa and no mass, fullness, tenderness               Rectovaginal: Confirms               Anus:  normal sphincter tone, no lesions  A:  Well Woman with normal exam  Menopausal no HRT  Osteoporosis on Prolia with PCP management  Hypothyroid stable medication with PCP management.  Spontaneous incontinence occasionally, no prolapse noted  P:   Reviewed health and wellness pertinent to exam  Aware of need to evaluate if vaginal bleeding  Continue follow up as indicated  Discussed working on kegel muscle exercise to see if this stops the leakage, also try to  consume fluids through out the day to see if that helps. If continues or increases need to advise.  Pap smear as per guidelines   Mammogram yearly pap smear not taken  counseled on breast self exam, mammography screening, adequate intake of calcium and vitamin D, Kegel's exercises  return annually or prn  An After Visit Summary was printed and given to the patient.

## 2013-09-18 NOTE — Progress Notes (Signed)
Reviewed personally.  M. Suzanne Jash Wahlen, MD.  

## 2013-10-01 ENCOUNTER — Other Ambulatory Visit: Payer: Self-pay | Admitting: Internal Medicine

## 2013-10-01 DIAGNOSIS — R109 Unspecified abdominal pain: Secondary | ICD-10-CM

## 2013-10-01 DIAGNOSIS — M545 Low back pain, unspecified: Secondary | ICD-10-CM | POA: Diagnosis not present

## 2013-10-01 DIAGNOSIS — R1013 Epigastric pain: Secondary | ICD-10-CM | POA: Diagnosis not present

## 2013-10-06 ENCOUNTER — Ambulatory Visit
Admission: RE | Admit: 2013-10-06 | Discharge: 2013-10-06 | Disposition: A | Discharge status: Home / Self Care | Payer: Medicare Other | Source: Ambulatory Visit | Attending: Internal Medicine | Admitting: Internal Medicine

## 2013-10-06 DIAGNOSIS — R109 Unspecified abdominal pain: Secondary | ICD-10-CM

## 2013-10-08 DIAGNOSIS — M549 Dorsalgia, unspecified: Secondary | ICD-10-CM | POA: Diagnosis not present

## 2013-10-08 DIAGNOSIS — R1013 Epigastric pain: Secondary | ICD-10-CM | POA: Diagnosis not present

## 2013-10-12 ENCOUNTER — Other Ambulatory Visit: Payer: Self-pay | Admitting: Gastroenterology

## 2013-10-12 DIAGNOSIS — R109 Unspecified abdominal pain: Secondary | ICD-10-CM

## 2013-10-12 DIAGNOSIS — R1084 Generalized abdominal pain: Secondary | ICD-10-CM | POA: Diagnosis not present

## 2013-10-14 DIAGNOSIS — IMO0002 Reserved for concepts with insufficient information to code with codable children: Secondary | ICD-10-CM | POA: Diagnosis not present

## 2013-10-14 DIAGNOSIS — M810 Age-related osteoporosis without current pathological fracture: Secondary | ICD-10-CM | POA: Diagnosis not present

## 2013-10-19 ENCOUNTER — Ambulatory Visit
Admission: RE | Admit: 2013-10-19 | Discharge: 2013-10-19 | Disposition: A | Discharge status: Home / Self Care | Payer: Medicare Other | Source: Ambulatory Visit | Attending: Gastroenterology | Admitting: Gastroenterology

## 2013-10-19 DIAGNOSIS — N2889 Other specified disorders of kidney and ureter: Secondary | ICD-10-CM | POA: Diagnosis not present

## 2013-10-19 DIAGNOSIS — R109 Unspecified abdominal pain: Secondary | ICD-10-CM

## 2013-10-19 MED ORDER — IOHEXOL 300 MG/ML  SOLN
100.0000 mL | Freq: Once | INTRAMUSCULAR | Status: AC | PRN
  Administered 2013-10-19: 100 mL via INTRAVENOUS

## 2013-11-04 DIAGNOSIS — L570 Actinic keratosis: Secondary | ICD-10-CM | POA: Diagnosis not present

## 2013-11-05 DIAGNOSIS — Z961 Presence of intraocular lens: Secondary | ICD-10-CM | POA: Diagnosis not present

## 2013-11-06 MED ORDER — DENOSUMAB 60 MG/ML ~~LOC~~ SOLN: 60.0000 mg | Freq: Once | SUBCUTANEOUS | Status: DC

## 2013-11-10 ENCOUNTER — Encounter (HOSPITAL_COMMUNITY)
Admission: RE | Admit: 2013-11-10 | Discharge: 2013-11-10 | Disposition: A | Discharge status: Home / Self Care | Payer: Medicare Other | Source: Ambulatory Visit | Attending: Internal Medicine | Admitting: Internal Medicine

## 2013-11-10 ENCOUNTER — Encounter (HOSPITAL_COMMUNITY): Payer: Self-pay

## 2013-11-10 DIAGNOSIS — M810 Age-related osteoporosis without current pathological fracture: Secondary | ICD-10-CM | POA: Insufficient documentation

## 2013-11-10 MED ORDER — DENOSUMAB 60 MG/ML ~~LOC~~ SOLN
60.0000 mg | Freq: Once | SUBCUTANEOUS | Status: AC
  Administered 2013-11-10: 60 mg via SUBCUTANEOUS
  Filled 2013-11-10: qty 1

## 2013-11-10 NOTE — Discharge Instructions (Signed)
Denosumab injection  What is this medicine?  DENOSUMAB (den oh sue mab) slows bone breakdown. Prolia is used to treat osteoporosis in women after menopause and in men. Xgeva is used to prevent bone fractures and other bone problems caused by cancer bone metastases. Xgeva is also used to treat giant cell tumor of the bone.  This medicine may be used for other purposes; ask your health care provider or pharmacist if you have questions.  COMMON BRAND NAME(S): Prolia, XGEVA  What should I tell my health care provider before I take this medicine?  They need to know if you have any of these conditions:  -dental disease  -eczema  -infection or history of infections  -kidney disease or on dialysis  -low blood calcium or vitamin D  -malabsorption syndrome  -scheduled to have surgery or tooth extraction  -taking medicine that contains denosumab  -thyroid or parathyroid disease  -an unusual reaction to denosumab, other medicines, foods, dyes, or preservatives  -pregnant or trying to get pregnant  -breast-feeding  How should I use this medicine?  This medicine is for injection under the skin. It is given by a health care professional in a hospital or clinic setting.  If you are getting Prolia, a special MedGuide will be given to you by the pharmacist with each prescription and refill. Be sure to read this information carefully each time.  For Prolia, talk to your pediatrician regarding the use of this medicine in children. Special care may be needed. For Xgeva, talk to your pediatrician regarding the use of this medicine in children. While this drug may be prescribed for children as young as 13 years for selected conditions, precautions do apply.  Overdosage: If you think you've taken too much of this medicine contact a poison control center or emergency room at once.  Overdosage: If you think you have taken too much of this medicine contact a poison control center or emergency room at once.  NOTE: This medicine is only for  you. Do not share this medicine with others.  What if I miss a dose?  It is important not to miss your dose. Call your doctor or health care professional if you are unable to keep an appointment.  What may interact with this medicine?  Do not take this medicine with any of the following medications:  -other medicines containing denosumab  This medicine may also interact with the following medications:  -medicines that suppress the immune system  -medicines that treat cancer  -steroid medicines like prednisone or cortisone  This list may not describe all possible interactions. Give your health care provider a list of all the medicines, herbs, non-prescription drugs, or dietary supplements you use. Also tell them if you smoke, drink alcohol, or use illegal drugs. Some items may interact with your medicine.  What should I watch for while using this medicine?  Visit your doctor or health care professional for regular checks on your progress. Your doctor or health care professional may order blood tests and other tests to see how you are doing.  Call your doctor or health care professional if you get a cold or other infection while receiving this medicine. Do not treat yourself. This medicine may decrease your body's ability to fight infection.  You should make sure you get enough calcium and vitamin D while you are taking this medicine, unless your doctor tells you not to. Discuss the foods you eat and the vitamins you take with your health care professional.    See your dentist regularly. Brush and floss your teeth as directed. Before you have any dental work done, tell your dentist you are receiving this medicine.  Do not become pregnant while taking this medicine or for 5 months after stopping it. Women should inform their doctor if they wish to become pregnant or think they might be pregnant. There is a potential for serious side effects to an unborn child. Talk to your health care professional or pharmacist for more  information.  What side effects may I notice from receiving this medicine?  Side effects that you should report to your doctor or health care professional as soon as possible:  -allergic reactions like skin rash, itching or hives, swelling of the face, lips, or tongue  -breathing problems  -chest pain  -fast, irregular heartbeat  -feeling faint or lightheaded, falls  -fever, chills, or any other sign of infection  -muscle spasms, tightening, or twitches  -numbness or tingling  -skin blisters or bumps, or is dry, peels, or red  -slow healing or unexplained pain in the mouth or jaw  -unusual bleeding or bruising  Side effects that usually do not require medical attention (Report these to your doctor or health care professional if they continue or are bothersome.):  -muscle pain  -stomach upset, gas  This list may not describe all possible side effects. Call your doctor for medical advice about side effects. You may report side effects to FDA at 1-800-FDA-1088.  Where should I keep my medicine?  This medicine is only given in a clinic, doctor's office, or other health care setting and will not be stored at home.  NOTE: This sheet is a summary. It may not cover all possible information. If you have questions about this medicine, talk to your doctor, pharmacist, or health care provider.  © 2014, [employer]vier/Gold Standard. (2012-02-18 12:37:47)

## 2013-11-30 DIAGNOSIS — Z800 Family history of malignant neoplasm of digestive organs: Secondary | ICD-10-CM | POA: Diagnosis not present

## 2013-11-30 DIAGNOSIS — K648 Other hemorrhoids: Secondary | ICD-10-CM | POA: Diagnosis not present

## 2013-11-30 DIAGNOSIS — K2100 Gastro-esophageal reflux disease with esophagitis, without bleeding: Secondary | ICD-10-CM | POA: Diagnosis not present

## 2013-11-30 DIAGNOSIS — K5730 Diverticulosis of large intestine without perforation or abscess without bleeding: Secondary | ICD-10-CM | POA: Diagnosis not present

## 2013-11-30 DIAGNOSIS — R1084 Generalized abdominal pain: Secondary | ICD-10-CM | POA: Diagnosis not present

## 2014-01-19 ENCOUNTER — Other Ambulatory Visit: Payer: Self-pay | Admitting: Neurology

## 2014-01-20 DIAGNOSIS — L723 Sebaceous cyst: Secondary | ICD-10-CM | POA: Diagnosis not present

## 2014-01-20 DIAGNOSIS — L738 Other specified follicular disorders: Secondary | ICD-10-CM | POA: Diagnosis not present

## 2014-01-20 DIAGNOSIS — L678 Other hair color and hair shaft abnormalities: Secondary | ICD-10-CM | POA: Diagnosis not present

## 2014-01-20 DIAGNOSIS — L708 Other acne: Secondary | ICD-10-CM | POA: Diagnosis not present

## 2014-04-02 DIAGNOSIS — Z23 Encounter for immunization: Secondary | ICD-10-CM | POA: Diagnosis not present

## 2014-04-02 DIAGNOSIS — Z0000 Encounter for general adult medical examination without abnormal findings: Secondary | ICD-10-CM | POA: Diagnosis not present

## 2014-04-02 DIAGNOSIS — Z7982 Long term (current) use of aspirin: Secondary | ICD-10-CM | POA: Diagnosis not present

## 2014-04-02 DIAGNOSIS — Z862 Personal history of diseases of the blood and blood-forming organs and certain disorders involving the immune mechanism: Secondary | ICD-10-CM | POA: Diagnosis not present

## 2014-04-02 DIAGNOSIS — E039 Hypothyroidism, unspecified: Secondary | ICD-10-CM | POA: Diagnosis not present

## 2014-04-07 DIAGNOSIS — J310 Chronic rhinitis: Secondary | ICD-10-CM | POA: Diagnosis not present

## 2014-04-07 DIAGNOSIS — M810 Age-related osteoporosis without current pathological fracture: Secondary | ICD-10-CM | POA: Diagnosis not present

## 2014-04-07 DIAGNOSIS — M545 Low back pain, unspecified: Secondary | ICD-10-CM | POA: Diagnosis not present

## 2014-04-07 DIAGNOSIS — R1084 Generalized abdominal pain: Secondary | ICD-10-CM | POA: Diagnosis not present

## 2014-04-07 DIAGNOSIS — Z1212 Encounter for screening for malignant neoplasm of rectum: Secondary | ICD-10-CM | POA: Diagnosis not present

## 2014-04-28 DIAGNOSIS — M810 Age-related osteoporosis without current pathological fracture: Secondary | ICD-10-CM | POA: Diagnosis not present

## 2014-04-28 DIAGNOSIS — Z79899 Other long term (current) drug therapy: Secondary | ICD-10-CM | POA: Diagnosis not present

## 2014-04-28 DIAGNOSIS — G894 Chronic pain syndrome: Secondary | ICD-10-CM | POA: Diagnosis not present

## 2014-04-28 DIAGNOSIS — M1990 Unspecified osteoarthritis, unspecified site: Secondary | ICD-10-CM | POA: Diagnosis not present

## 2014-04-28 DIAGNOSIS — M5137 Other intervertebral disc degeneration, lumbosacral region: Secondary | ICD-10-CM | POA: Diagnosis not present

## 2014-04-28 DIAGNOSIS — IMO0002 Reserved for concepts with insufficient information to code with codable children: Secondary | ICD-10-CM | POA: Diagnosis not present

## 2014-05-14 ENCOUNTER — Ambulatory Visit: Payer: Medicare Other | Admitting: Nurse Practitioner

## 2014-05-20 DIAGNOSIS — M810 Age-related osteoporosis without current pathological fracture: Secondary | ICD-10-CM | POA: Diagnosis not present

## 2014-05-31 ENCOUNTER — Ambulatory Visit: Payer: Medicare Other | Admitting: Nurse Practitioner

## 2014-06-01 ENCOUNTER — Encounter: Payer: Self-pay | Admitting: Nurse Practitioner

## 2014-06-01 ENCOUNTER — Ambulatory Visit (INDEPENDENT_AMBULATORY_CARE_PROVIDER_SITE_OTHER): Payer: Medicare Other | Admitting: Nurse Practitioner

## 2014-06-01 VITALS — BP 149/88 | HR 71 | Wt 102.2 lb

## 2014-06-01 DIAGNOSIS — G253 Myoclonus: Secondary | ICD-10-CM | POA: Diagnosis not present

## 2014-06-01 DIAGNOSIS — G2581 Restless legs syndrome: Secondary | ICD-10-CM | POA: Diagnosis not present

## 2014-06-01 MED ORDER — ROPINIROLE HCL 0.25 MG PO TABS: 0.2500 mg | ORAL_TABLET | Freq: Three times a day (TID) | ORAL | Status: DC

## 2014-06-01 NOTE — Patient Instructions (Signed)
Continue Requip at current dose will refill Use cane as necessary to prevent falls F/U yearly and as necessary

## 2014-06-01 NOTE — Progress Notes (Signed)
[facility]  PATIENT: CHINWE LOPE DOB: March 08, 1929   REASON FOR VISIT: for restless leg syndrome   HISTORY OF PRESENT ILLNESS:Almas Jerilynn Mages Simerson is a 80 y.o. female Is seen here as  revisit  for Restless legs. She was last seen by Dr. Brett Fairy 05/13/2013.This patient was originally seen in 2011 for tremors and rest less legs, now controlled on Requip. She developed no  compulsive behaviors nor Visual hallucinations , or other side effects to the drug. The patient had in the past tried Lyrica and could not tolerate it. She denies any falls in the last year, she occasionally uses a cane. She is  driving without difficulties she  continues  to garden outside.  Her son lives with her. The patient has a history of compression fractures and chronic back pain she had undergone a kyphoplasty after MRI confirmed the compression fractures and has been undergoing epidural injections for back pain. She returns for reevaluation    REVIEW OF SYSTEMS: Full 14 system review of systems performed and notable only for those listed, all others are neg:  Constitutional: Fatigue Cardiovascular: N/A  Ear/Nose/Throat: N/A  Skin: N/A  Eyes: N/A  Respiratory: N/A  Gastroitestinal: N/A  Hematology/Lymphatic: N/A  Endocrine: N/A Musculoskeletal:N/A  Allergy/Immunology: N/A  Neurological: N/A Psychiatric: N/A Sleep : Restless legs   ALLERGIES: Allergies  Allergen Reactions  . Ibuprofen Anaphylaxis    Passed out and had to be hospitalized  . Penicillins     Skin on head was crawling and very very sleepy  . Demerol [Meperidine Hcl] Nausea And Vomiting  . Dilaudid [Hydromorphone Hcl] Nausea And Vomiting  . Lyrica [Pregabalin]     "made me a zombie'  . Morphine And Related Nausea And Vomiting  . Percocet [Oxycodone-Acetaminophen] Nausea And Vomiting  . Vicodin [Hydrocodone-Acetaminophen] Nausea And Vomiting    HOME MEDICATIONS: Outpatient Prescriptions Prior to Visit  Medication  Sig Dispense Refill  . acetaminophen (TYLENOL) 500 MG tablet Take 1,000 mg by mouth every 6 (six) hours as needed. Usually only takes 2 x a  day      . aspirin EC 81 MG tablet Take 162 mg by mouth at bedtime.       . calcium gluconate 500 MG tablet Take 500 mg by mouth 2 (two) times daily. With 1000 units D3      . cholecalciferol (VITAMIN D) 1000 UNITS tablet Take 1,000 Units by mouth daily. Take with calium      . Cranberry 200 MG CAPS Take by mouth 1 day or 1 dose.      Marland Kitchen denosumab (PROLIA) 60 MG/ML SOLN injection Inject 60 mg into the skin every 6 (six) months. Administer in upper arm, thigh, or abdomen      . Flaxseed, Linseed, (FLAX SEED OIL) 1000 MG CAPS Take by mouth 2 (two) times daily.      Marland Kitchen ipratropium (ATROVENT) 0.06 % nasal spray as needed.       Marland Kitchen levothyroxine (SYNTHROID, LEVOTHROID) 50 MCG tablet Take 50 mcg by mouth daily.      . meclizine (ANTIVERT) 25 MG tablet Take 25 mg by mouth as needed.       . Multiple Vitamins-Minerals (CENTRUM SILVER PO) Take by mouth 1 day or 1 dose.      Marland Kitchen rOPINIRole (REQUIP) 0.25 MG tablet Take 1 tablet (0.25 mg total) by mouth 3 (three) times daily.  270 tablet  0  . gabapentin (NEURONTIN) 100 MG capsule Take 200 mg by mouth at  bedtime.      Marland Kitchen rOPINIRole (REQUIP) 0.25 MG tablet Take 1 tablet (0.25 mg total) by mouth 3 (three) times daily.  270 tablet  1   No facility-administered medications prior to visit.    PAST MEDICAL HISTORY: Past Medical History  Diagnosis Date  . Depression   . History of cardiac catheterization 2002    Negative  . Heart disease   . Pain     chronic  . RLS (restless legs syndrome)   . Headache(784.0)   . Vertebral fracture   . Restless legs syndrome with nocturnal myoclonus 05/13/2013     requip controlled   . Dense breast   . Abnormal uterine bleeding 7371,0626  . Osteoporosis   . Hepatitis     age 42  . Stomach ulcer   . Hypothyroidism     PAST SURGICAL HISTORY: Past Surgical History  Procedure  Laterality Date  . Back surgery      x 3  . Breast surgery Bilateral     1 lump each breast removed and benign  . Nasal sinus surgery    . Dilation and curettage of uterus  1970  . Cholecystectomy  1995    Lap  . Anemia      per pt, she states she doesnt remember that.maybe yrs ago  . Stomach ulcer  2010    and 2011  . Catheterization  2003    cardiac cath  . Gastric bypass    . Ulcers  2009    anemia, due to bleed  . Kyphoplasty  2010  . Cataract extraction Bilateral 2011  . Abdominal hysterectomy  10/1994    FAMILY HISTORY: Family History  Problem Relation Age of Onset  . Asthma Brother   . Osteoarthritis Son   . Hypertension Father   . Stroke Father   . Cancer Brother     stomach    SOCIAL HISTORY: History   Social History  . Marital Status: Widowed    Spouse Name: N/A    Number of Children: 3  . Years of Education: some coll.   Occupational History  . retired     Primary school teacher position   Social History Main Topics  . Smoking status: Never Smoker   . Smokeless tobacco: Never Used  . Alcohol Use: No  . Drug Use: No  . Sexual Activity: No   Other Topics Concern  . Not on file   Social History Narrative   Patient is widowed with 3 children.   Patient is right handed.   Patient has some college education.   Patient drinks 1-2 cups daily.     PHYSICAL EXAM  Filed Vitals:   06/01/14 0842  BP: 149/88  Pulse: 71  Weight: 102 lb 3.2 oz (46.358 kg)   Body mass index is 18.11 kg/(m^2). General: The patient is awake, alert and appears not in acute distress. The patient is well groomed.  Head: Normocephalic, atraumatic. Moderate cerumen left ear Neck is supple.   Cardiovascular: Regular rate and rhythm, without murmurs   Respiratory: Lungs are clear to auscultation.  Skin: Without evidence of edema, or rash    Neurologic exam :  The patient is awake and alert, oriented to place and time. Memory subjective described as intact. There is a  normal attention span & concentration ability. Speech is fluent with dysphonia , not aphasia. Mood and affect are appropriate.  Cranial nerves: Pupils are equal and briskly reactive to light.  Cataract: 2011 bilateral  replaced lens.  Extraocular movements in vertical and horizontal planes intact and without nystagmus. Visual fields by finger perimetry are intact.  Hearing to finger rub intact. Facial sensation intact to fine touch. Facial motor strength is symmetric and tongue and uvula move midline.  Motor exam: Normal tone and normal muscle bulk and symmetric normal strength in all extremities.  Sensory: Fine touch, pinprick and vibration were tested in all extremities. Proprioception is normal.  Coordination: Rapid alternating movements in the fingers/hands is tested and normal. Finger-to-nose maneuver tested and normal with a very mild bilateral tremor.  Gait and station: Patient walks without assistive device . Strength within normal limits for age . Stance is stable and normal. Tandem gait is stable.  Romberg testing is normal.  Deep tendon reflexes: in the upper and lower extremities are symmetric and intact. Babinski maneuver downgoing.     DIAGNOSTIC DATA (LABS, IMAGING, TESTING) - I reviewed patient records, labs, notes, testing and imaging myself where available.  Lab Results  Component Value Date   WBC 9.2 06/02/2009   HGB 15.0 11/15/2011   HCT 44.0 11/15/2011   MCV 95.3 06/02/2009   PLT 281 06/02/2009      Component Value Date/Time   NA 142 11/15/2011 1436   K 4.2 11/15/2011 1436   CL 110 11/15/2011 1436   CO2 26 05/31/2009 0415   GLUCOSE 105* 11/15/2011 1436   BUN 29* 11/15/2011 1436   CREATININE 1.10 11/15/2011 1436   CALCIUM 8.8 05/31/2009 0415   PROT 5.1* 05/27/2009 0334   ALBUMIN 2.5* 05/27/2009 0334   AST 26 05/27/2009 0334   ALT 28 05/27/2009 0334   ALKPHOS 63 05/27/2009 0334   BILITOT 0.5 05/27/2009 0334   GFRNONAA 59* 05/31/2009 0415   GFRAA  Value: >60        The eGFR has  been calculated using the MDRD equation. This calculation has not been validated in all clinical situations. eGFR's persistently <60 mL/min signify possible Chronic Kidney Disease. 05/31/2009 0415       ASSESSMENT AND PLAN  80 y.o. year old female  has a past medical history of  RLS (restless legs syndrome); and essential tremor which is mild.  Continue Requip at current dose will refill Use cane as necessary to prevent falls F/U yearly and as necessary Dennie Bible, [facility], [facility], [facility] [address] [place], [place] [address] [phone]

## 2014-06-01 NOTE — Progress Notes (Signed)
I agree with the assessment and plan as directed by NP .The patient is known to me .   Dangelo Guzzetta, MD  

## 2014-06-07 DIAGNOSIS — R1013 Epigastric pain: Secondary | ICD-10-CM | POA: Diagnosis not present

## 2014-06-23 DIAGNOSIS — Z23 Encounter for immunization: Secondary | ICD-10-CM | POA: Diagnosis not present

## 2014-07-05 ENCOUNTER — Encounter: Payer: Self-pay | Admitting: Nurse Practitioner

## 2014-07-12 DIAGNOSIS — Z1231 Encounter for screening mammogram for malignant neoplasm of breast: Secondary | ICD-10-CM | POA: Diagnosis not present

## 2014-07-19 DIAGNOSIS — M810 Age-related osteoporosis without current pathological fracture: Secondary | ICD-10-CM | POA: Diagnosis not present

## 2014-07-19 DIAGNOSIS — R2689 Other abnormalities of gait and mobility: Secondary | ICD-10-CM | POA: Diagnosis not present

## 2014-07-19 DIAGNOSIS — M795 Residual foreign body in soft tissue: Secondary | ICD-10-CM | POA: Diagnosis not present

## 2014-09-10 ENCOUNTER — Other Ambulatory Visit: Payer: Self-pay | Admitting: Neurology

## 2014-09-22 ENCOUNTER — Ambulatory Visit (INDEPENDENT_AMBULATORY_CARE_PROVIDER_SITE_OTHER): Payer: Medicare Other | Admitting: Certified Nurse Midwife

## 2014-09-22 ENCOUNTER — Encounter: Payer: Self-pay | Admitting: Certified Nurse Midwife

## 2014-09-22 VITALS — BP 110/66 | HR 78 | Resp 16 | Ht 63.0 in | Wt 103.0 lb

## 2014-09-22 DIAGNOSIS — Z01419 Encounter for gynecological examination (general) (routine) without abnormal findings: Secondary | ICD-10-CM

## 2014-09-22 DIAGNOSIS — N950 Postmenopausal bleeding: Secondary | ICD-10-CM

## 2014-09-22 DIAGNOSIS — Z124 Encounter for screening for malignant neoplasm of cervix: Secondary | ICD-10-CM | POA: Diagnosis not present

## 2014-09-22 DIAGNOSIS — Z0000 Encounter for general adult medical examination without abnormal findings: Secondary | ICD-10-CM | POA: Diagnosis not present

## 2014-09-22 LAB — POCT URINALYSIS DIPSTICK
LEUKOCYTES UA: NEGATIVE
PH UA: 5
Urobilinogen, UA: NEGATIVE

## 2014-09-22 NOTE — Patient Instructions (Signed)

## 2014-09-22 NOTE — Progress Notes (Signed)
81 y.o. G2P2 Widowed  Caucasian Fe here for annual exam. Menopausal previously no vaginal bleeding ever until had abdominal discomfort and passed large bright blood clot larger than a silver dollar as she was going to church. Continues to have low occasional low abdominal discomfort. Denies bowel issues or urinary changes. Patient did not call because she had this exam. Sees Dr Shelia Media for medication management, aex, and labs. No weight change in past year. Continues with Prolia yearly with PCP management. Son lives with her and assists as needed. Patient still drives short distances with out any problems.. No other health issues today.  Patient's last menstrual period was 09/03/1996.          Sexually active: No.  The current method of family planning is post menopausal status.    Exercising: Yes.    walk outside and walk up and down stairs Smoker:  no  Health Maintenance: Pap:  08/02/2010 Negative MMG:  07/07/12 Bi-Rads 2: Benign findings per patient had one in 2015 negative, will get records Colonoscopy:  11/10 Normal  f/u in 2020 BMD:   07/07/12  TDaP:  2010 Labs: [name], Urine: Trace Protien   reports that she has never smoked. She has never used smokeless tobacco. She reports that she does not drink alcohol or use illicit drugs.  Past Medical History  Diagnosis Date  . Depression   . History of cardiac catheterization 2002    Negative  . Heart disease   . Pain     chronic  . RLS (restless legs syndrome)   . Headache(784.0)   . Vertebral fracture   . Restless legs syndrome with nocturnal myoclonus 05/13/2013     requip controlled   . Dense breast   . Abnormal uterine bleeding [id number]  . Osteoporosis   . Hepatitis     age 46  . Stomach ulcer   . Hypothyroidism     Past Surgical History  Procedure Laterality Date  . Back surgery      x 3  . Breast surgery Bilateral     1 lump each breast removed and benign  . Nasal sinus surgery    . Dilation and  curettage of uterus  1970  . Cholecystectomy  1995    Lap  . Anemia      per pt, she states she doesnt remember that.maybe yrs ago  . Stomach ulcer  2010    and 2011  . Catheterization  2003    cardiac cath  . Gastric bypass    . Ulcers  2009    anemia, due to bleed  . Kyphoplasty  2010  . Cataract extraction Bilateral 2011  . Abdominal hysterectomy  10/1994    Current Outpatient Prescriptions  Medication Sig Dispense Refill  . acetaminophen (TYLENOL) 500 MG tablet Take 1,000 mg by mouth every 6 (six) hours as needed. Usually only takes 2 x a  day    . aspirin EC 81 MG tablet Take 162 mg by mouth at bedtime.     . calcium gluconate 500 MG tablet Take 500 mg by mouth 2 (two) times daily. With 1000 units D3    . cholecalciferol (VITAMIN D) 1000 UNITS tablet Take 1,000 Units by mouth daily. Take with calium    . Cranberry 200 MG CAPS Take by mouth 1 day or 1 dose.    Marland Kitchen denosumab (PROLIA) 60 MG/ML SOLN injection Inject 60 mg into the skin every 6 (six) months. Administer in upper arm, thigh,  or abdomen    . Flaxseed, Linseed, (FLAX SEED OIL) 1000 MG CAPS Take by mouth 2 (two) times daily.    Marland Kitchen ipratropium (ATROVENT) 0.06 % nasal spray as needed.     Marland Kitchen levothyroxine (SYNTHROID, LEVOTHROID) 50 MCG tablet Take 50 mcg by mouth daily.    . meclizine (ANTIVERT) 25 MG tablet Take 25 mg by mouth as needed.     . Multiple Vitamins-Minerals (CENTRUM SILVER PO) Take by mouth 1 day or 1 dose.    Marland Kitchen rOPINIRole (REQUIP) 0.25 MG tablet TAKE 1 TABLET BY MOUTH 3 TIMES A DAY 270 tablet 2   No current facility-administered medications for this visit.    Family History  Problem Relation Age of Onset  . Asthma Brother   . Osteoarthritis Son   . Hypertension Father   . Stroke Father   . Cancer Brother     stomach    ROS:  Pertinent items are noted in HPI.  Otherwise, a comprehensive ROS was negative.  Exam:   BP 110/66 mmHg  Pulse 78  Resp 16  Ht 5\' 3"  (1.6 m)  Wt 103 lb (46.72 kg)  BMI  18.25 kg/m2  LMP 09/03/1996 Height: 5\' 3"  (160 cm) Ht Readings from Last 3 Encounters:  09/22/14 5\' 3"  (1.6 m)  09/16/13 5' 3.25" (1.607 m)  05/13/13 5\' 3"  (1.6 m)    General appearance: alert, cooperative and appears stated age Ambulates without assistance Head: Normocephalic, without obvious abnormality, atraumatic Neck: no adenopathy, supple, symmetrical, trachea midline and thyroid normal to inspection and palpation Lungs: clear to auscultation bilaterally Dowagers hump noted from osteoporosis Breasts: normal appearance, no masses or tenderness, No nipple retraction or dimpling, No nipple discharge or bleeding, No axillary or supraclavicular adenopathy Heart: regular rate and rhythm Abdomen: soft, non-tender; no masses,  no organomegaly Extremities: extremities normal, atraumatic, no cyanosis or edema Skin: Skin color, texture, turgor normal. No rashes or lesions Lymph nodes: Cervical, supraclavicular, and axillary nodes normal. No abnormal inguinal nodes palpated Neurologic: Grossly normal   Pelvic: External genitalia:  no lesions              Urethra:  normal appearing urethra with no masses, tenderness or lesions              Bartholin's and Skene's: normal                 Vagina: normal appearing vagina with normal color and discharge, no lesions              Cervix: parous, non tender, no bleeding with pap smear, no lesions,              Pap taken: Yes.   Bimanual Exam:  Uterus:  normal size, contour, position, consistency, mobility, non-tender              Adnexa: no mass, fullness, tenderness and adnexa not palpable, no masses noted or tenderness               Rectovaginal: Confirms               Anus:  normal sphincter tone, no lesions  Chaperone present: Yes  A:  Well Woman with normal exam  Menopausal no HRT  Osteoporosis with Prolia use, Hypothyroid with PCP management  Post menopausal bleeding  P:   Reviewed health and wellness pertinent to exam  Continue MD  follow up as indicated.  Discussed with due to bleeding episode will need evaluation with  PUS and endometrial biopsy. Patient agreeable and wants to " know what is up"? Instructed she will be called with insurance information and scheduled for PUS in our office and Provider will discuss findings of PUS and will do Endometrial biopsy if needed. Patient voiced understanding. Will advise if any further bleeding. Will have son come with her if needed. Questions addressed at length regarding procedure and PUS.  Pap smear taken today   counseled on breast self exam, mammography screening, adequate intake of calcium and vitamin D, diet and exercise, Kegel's exercises  return annually or prn  15 minutes spent with patient  in face to face counseling regarding post menopausal bleeding and evaluation with PUS and endometrial biopsy.   An After Visit Summary was printed and given to the patient.

## 2014-09-23 LAB — IPS PAP SMEAR ONLY

## 2014-09-26 NOTE — Progress Notes (Signed)
Reviewed personally.  M. Suzanne Jackelyne Sayer, MD.  

## 2014-09-28 ENCOUNTER — Other Ambulatory Visit: Payer: Self-pay

## 2014-09-28 DIAGNOSIS — N950 Postmenopausal bleeding: Secondary | ICD-10-CM

## 2014-09-29 ENCOUNTER — Other Ambulatory Visit: Payer: Self-pay

## 2014-09-29 DIAGNOSIS — N950 Postmenopausal bleeding: Secondary | ICD-10-CM

## 2014-10-03 NOTE — Progress Notes (Signed)
I prefer to see the patient sooner.  Can her appointment be moved up to 10/10/14?

## 2014-10-04 NOTE — Progress Notes (Signed)
Spoke with patient. She is agreeable to earlier appointment scheduled for 10/07/14 at 1030 for Pelvic ultrasound, Endometrial biopsy and consult with Dr. Quincy Simmonds.

## 2014-10-07 ENCOUNTER — Encounter: Payer: Self-pay | Admitting: Obstetrics and Gynecology

## 2014-10-07 ENCOUNTER — Ambulatory Visit (INDEPENDENT_AMBULATORY_CARE_PROVIDER_SITE_OTHER): Payer: Medicare Other

## 2014-10-07 ENCOUNTER — Ambulatory Visit (INDEPENDENT_AMBULATORY_CARE_PROVIDER_SITE_OTHER): Payer: Medicare Other | Admitting: Obstetrics and Gynecology

## 2014-10-07 VITALS — BP 140/64 | HR 80 | Ht 63.25 in | Wt 103.0 lb

## 2014-10-07 DIAGNOSIS — N950 Postmenopausal bleeding: Secondary | ICD-10-CM

## 2014-10-07 DIAGNOSIS — D3911 Neoplasm of uncertain behavior of right ovary: Secondary | ICD-10-CM | POA: Diagnosis not present

## 2014-10-07 DIAGNOSIS — N858 Other specified noninflammatory disorders of uterus: Secondary | ICD-10-CM | POA: Diagnosis not present

## 2014-10-07 NOTE — Progress Notes (Signed)
  Subjective  Patient is here today for pelvic ultrasound for postmenopausal bleeding.  Had an episode of abdominal pain and passed blood on Christmas Eve.  Had another episode of bleeding a small amount after that.  This has never happened before.   Has lower abdominal pain a lot.  No dysuria.  No change in bowel function.  No blood in urine and bowel movements.  No black tarry stools.   Stopped HRT many years ago.   Status post dilation and curettage - benign proliferative endometrium.  1996  Objective   Pelvic ultrasound images and report reviewed with patient.   Uterus with no masses. EMS - 3.76 mm and symmetrical. Left ovary normal.  Right ovary 19 mm cyst with internal few internal echoes.  No abnormal blood flow.  Anterior margins unclear due to surface shadowing.  No free fluid.     Procedure - EMB. Consent for procedure. Speculum placed in vagina.  Hibiclens to sterilize cervix.  Tenaculum to anterior cervical lip.  Pipelle passed x 2.  Tissue to pathology.  Minimal EBL. No complications.  Given Coke after procedure and observed due to cramping.   Assessment  Postmenopausal bleeding.  Right ovarian cyst.   Plan  Post EMB instructions reviewed with patient.  Follow up biopsy results. Discussed postmenopausal bleeding - potential etiologies - atrophy, polyp, precancerous change of the endometrium, cancer.  Discussed ovarian cysts.  CA125 explained and drawn.   25 minutes face to face time of which over 50% was spent in counseling.   After visit summary to patient.

## 2014-10-08 LAB — CA 125: CA 125: 53 U/mL — ABNORMAL HIGH (ref <35)

## 2014-10-11 ENCOUNTER — Telehealth: Payer: Self-pay | Admitting: Obstetrics and Gynecology

## 2014-10-11 DIAGNOSIS — N83201 Unspecified ovarian cyst, right side: Secondary | ICD-10-CM

## 2014-10-11 DIAGNOSIS — R971 Elevated cancer antigen 125 [CA 125]: Secondary | ICD-10-CM

## 2014-10-11 DIAGNOSIS — N950 Postmenopausal bleeding: Secondary | ICD-10-CM

## 2014-10-11 LAB — IPS OTHER TISSUE BIOPSY

## 2014-10-11 NOTE — Telephone Encounter (Signed)
Phone call to discuss results with patient.   Patient still having some spotting.   Endometrial biopsy - benign endometrium with atrophy.  No hyperplasia or malignancy.   CA125 53.  Assessment  Postmenopausal bleeding  Right ovarian cyst.  Elevated CA125  Plan  Discussion of results of tests. Will proceed with GYN ONC referral for evaluation and treatment.  Questions answered.

## 2014-10-12 ENCOUNTER — Telehealth: Payer: Self-pay | Admitting: Obstetrics and Gynecology

## 2014-10-12 NOTE — Telephone Encounter (Signed)
Spoke with patient. Advised patient of phone call from [facility] office. Advised of appointment date and time. Patient is agreeable and verbalizes understanding. Address and phone number as seen below provided to patient. Patient is agreeable.  [address], [place] [address]  Main: [phone]    Routing to provider for final review. Patient agreeable to disposition. Will close encounter

## 2014-10-12 NOTE — Addendum Note (Signed)
Addended by: Michele Mcalpine on: 10/12/2014 09:46 AM   Modules accepted: Orders

## 2014-10-12 NOTE — Telephone Encounter (Signed)
Patient scheduled 10/15/14 at 10:30am with Dr.Clarke-Pearson , patient needs to arrive at 10:00am to check in.

## 2014-10-12 NOTE — Telephone Encounter (Signed)
Referral placed for Gyn oncology.   Message left at Cancer center for appointment.

## 2014-10-13 NOTE — Telephone Encounter (Signed)
Spoke with patient to advise regarding appointment at  The appointment is scheduled for 10/15/14 at 1030 with Dr. Fermin Schwab.   Instructions given:   They would like you to arrive 30 minutes early for registration. Please bring your photo identification card and a copy of your insurance card. Please be aware that the Doctor will complete a pelvic exam on this day as well.   The address is: [facility] [address], [place] [address]   Patient agreeable. She will call our office or GYN onc directly with any concerns. Routing to provider for final review. Patient agreeable to disposition. Will close encounter

## 2014-10-15 ENCOUNTER — Encounter: Payer: Self-pay | Admitting: Gynecology

## 2014-10-15 ENCOUNTER — Ambulatory Visit: Payer: Medicare Other | Attending: Gynecology | Admitting: Gynecology

## 2014-10-15 VITALS — BP 142/89 | HR 72 | Temp 97.4°F | Resp 18 | Wt 104.7 lb

## 2014-10-15 DIAGNOSIS — R1084 Generalized abdominal pain: Secondary | ICD-10-CM

## 2014-10-15 DIAGNOSIS — R188 Other ascites: Secondary | ICD-10-CM

## 2014-10-15 DIAGNOSIS — N83201 Unspecified ovarian cyst, right side: Secondary | ICD-10-CM

## 2014-10-15 DIAGNOSIS — R109 Unspecified abdominal pain: Secondary | ICD-10-CM | POA: Insufficient documentation

## 2014-10-15 DIAGNOSIS — R971 Elevated cancer antigen 125 [CA 125]: Secondary | ICD-10-CM

## 2014-10-15 DIAGNOSIS — N8320 Unspecified ovarian cysts: Secondary | ICD-10-CM

## 2014-10-15 NOTE — Progress Notes (Signed)
Consult Note: Gyn-Onc   Linda [name] 81 y.o. female  Chief Complaint  Patient presents with  . New patient    Vaginal Bleeding    Assessment and plan: Diffuse abdominal pain, simple ovarian cyst (right), elevated CA-125, scant ascites. 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 I have discussed the uncertainties of this diagnosis with the patient and she is in agreement that she would like to proceed with diagnostic laparoscopy and right salpingo-oophorectomy. Surgery scheduled for 11/02/2014.  The risks of surgery were reviewed and all questions are answered.  HPI: 81 year old white widowed female seen in consultation at the request of Dr. Quincy Simmonds regarding a constellation of findings and symptoms. The patient was in her usual state of good health until she had some postmenopausal bleeding. Evaluation bleeding included a transvaginal ultrasound a Pap smear and endometrial biopsy. The endometrial stripe was 3.76 mm. Endometrial biopsy showed small fragments of atrophic endometrium. Ultrasound also showed a right ovarian cyst measuring 19 mm with a few internal echoes but no abnormal blood flow. CA-125 value was obtained and returned as 53 units per mL. Subsequently the patient had a CT scan of the abdomen and pelvis which appeared entirely normal except for a small amount of parahepatic fluid.  The patient denies any past gynecologic history. She has had 3 uncomplicated pregnancies.  She does note that for the last several months she has had ever increasing diffuse abdominal pain which worsens at the end of each day. She denies any nausea or vomiting or any other GI symptoms.  Review of Systems:10 point review of systems is negative except as noted in interval history.   Vitals: Blood pressure 142/89, pulse 72, temperature 97.4 F (36.3 C), temperature source Oral, resp. rate 18, weight 104 lb 11.2  oz (47.492 kg), last menstrual period 09/03/1996, SpO2 100 %.  Physical Exam: General : The patient is a healthy elderly woman in no acute distress.  HEENT: normocephalic, extraoccular movements normal; neck is supple without thyromegally  Lynphnodes: Supraclavicular and inguinal nodes not enlarged  Abdomen: Soft, yet, on palpation she is exquisitely sensitive with tenderness., no ascites, no organomegally, no masses, no hernias  Pelvic:  EGBUS: Normal female  Vagina: Normal, atrophic Urethra and Bladder: Normal, non-tender  Cervix: Small, atrophic Uterus: Small with some tenderness to palpation. Bi-manual examination: Non-tender; no adenxal masses or nodularity  Rectal: normal sphincter tone, no masses, no blood  Lower extremities: No edema or varicosities. Normal range of motion      Allergies  Allergen Reactions  . Ibuprofen Anaphylaxis    Passed out and had to be hospitalized  . Penicillins     Skin on head was crawling and very very sleepy  . Demerol [Meperidine Hcl] Nausea And Vomiting  . Dilaudid [Hydromorphone Hcl] Nausea And Vomiting  . Lyrica [Pregabalin]     "made me a zombie'  . Morphine And Related Nausea And Vomiting  . Percocet [Oxycodone-Acetaminophen] Nausea And Vomiting  . Vicodin [Hydrocodone-Acetaminophen] Nausea And Vomiting    Past Medical History  Diagnosis Date  . Depression   . History of cardiac catheterization 2002    Negative  . Heart disease   . Pain     chronic  . RLS (restless legs syndrome)   . Headache(784.0)   . Vertebral fracture   . Restless legs syndrome with nocturnal myoclonus 05/13/2013     [place] controlled   . Dense breast   .  Abnormal uterine bleeding 3009,2330  . Osteoporosis   . Hepatitis     age 58  . Stomach ulcer   . Hypothyroidism     Past Surgical History  Procedure Laterality Date  . Back surgery      x 3  . Breast surgery Bilateral     1 lump each breast removed and benign  . Nasal sinus surgery    .  Dilation and curettage of uterus  1970  . Cholecystectomy  1995    Lap  . Anemia      per pt, she states she doesnt remember that.maybe yrs ago  . Stomach ulcer  2010    and 2011  . Catheterization  2003    cardiac cath  . Gastric bypass    . Ulcers  2009    anemia, due to bleed  . Kyphoplasty  2010  . Cataract extraction Bilateral 2011    Current Outpatient Prescriptions  Medication Sig Dispense Refill  . acetaminophen (TYLENOL) 500 MG tablet Take 1,000 mg by mouth every 6 (six) hours as needed. Usually only takes 2 x a  day    . aspirin EC 81 MG tablet Take 162 mg by mouth at bedtime.     . calcium gluconate 500 MG tablet Take 500 mg by mouth 2 (two) times daily. With 1000 units D3    . cholecalciferol (VITAMIN D) 1000 UNITS tablet Take 1,000 Units by mouth daily. Take with calium    . Cranberry 200 MG CAPS Take by mouth 1 day or 1 dose.    Marland Kitchen denosumab (PROLIA) 60 MG/ML SOLN injection Inject 60 mg into the skin every 6 (six) months. Administer in upper arm, thigh, or abdomen    . Flaxseed, Linseed, (FLAX SEED OIL) 1000 MG CAPS Take by mouth 2 (two) times daily.    Marland Kitchen levothyroxine (SYNTHROID, LEVOTHROID) 50 MCG tablet Take 50 mcg by mouth daily.    . meclizine (ANTIVERT) 25 MG tablet Take 25 mg by mouth as needed.     . Multiple Vitamins-Minerals (CENTRUM SILVER PO) Take by mouth 1 day or 1 dose.    Marland Kitchen ipratropium (ATROVENT) 0.06 % nasal spray as needed.     Marland Kitchen rOPINIRole (REQUIP) 0.25 MG tablet TAKE 1 TABLET BY MOUTH 3 TIMES A DAY (Patient not taking: Reported on 10/15/2014) 270 tablet 2   No current facility-administered medications for this visit.    History   Social History  . Marital Status: Widowed    Spouse Name: N/A  . Number of Children: 3  . Years of Education: some coll.   Occupational History  . retired     Primary school teacher position   Social History Main Topics  . Smoking status: Never Smoker   . Smokeless tobacco: Never Used  . Alcohol Use: No  . Drug  Use: No  . Sexual Activity: No   Other Topics Concern  . Not on file   Social History Narrative   Patient is widowed with 3 children.   Patient is right handed.   Patient has some college education.   Patient drinks 1-2 cups daily.    Family History  Problem Relation Age of Onset  . Asthma Brother   . Osteoarthritis Son   . Hypertension Father   . Stroke Father   . Cancer Brother     stomach      Alvino Chapel, MD 10/15/2014, 11:24 AM

## 2014-10-15 NOTE — Patient Instructions (Signed)
Preparing for your Surgery  Plan for surgery on March 1 with Dr. Denman George.  Pre-operative Testing -You will receive a phone call from presurgical testing at [facility] to arrange for a pre-operative testing appointment before your surgery.  This appointment normally occurs one to two weeks before your scheduled surgery.   -Bring your insurance card, copy of an advanced directive if applicable, medication list  -At that visit, you will be asked to sign a consent for a possible blood transfusion in case a transfusion becomes necessary during surgery.  The need for a blood transfusion is rare but having consent is a necessary part of your care.     -You should not be taking blood thinners or aspirin at least ten days prior to surgery unless instructed by your surgeon.  Day Before Surgery at [facility] will be asked to take in only clear liquids the day before surgery.  Examples of clear liquids include broths, jello, and clear juices.  You will be advised to have nothing to eat or drink after midnight the evening before.    Your role in recovery Your role is to become active as soon as directed by your doctor, while still giving yourself time to heal.  Rest when you feel tired. You will be asked to do the following in order to speed your recovery:  - Cough and breathe deeply. This helps toclear and expand your lungs and can prevent pneumonia. You may be given a spirometer to practice deep breathing. A staff member will show you how to use the spirometer. - Do mild physical activity. Walking or moving your legs help your circulation and body functions return to normal. A staff member will help you when you try to walk and will provide you with simple exercises. Do not try to get up or walk alone the first time. - Actively manage your pain. Managing your pain lets you move in comfort. We will ask you to rate your pain on a scale of zero to 10. It is your responsibility to tell  your doctor or nurse where and how much you hurt so your pain can be treated.  Special Considerations -If you are diabetic, you may be placed on insulin after surgery to have closer control over your blood sugars to promote healing and recovery.  This does not mean that you will be discharged on insulin.  If applicable, your oral antidiabetics will be resumed when you are tolerating a solid diet.  -Your final pathology results from surgery should be available by the Friday after surgery and the results will be relayed to you when available.

## 2014-10-22 NOTE — Progress Notes (Signed)
Please put orders in Epic surgery 11-02-14 pre op 02-26-1Thanks

## 2014-10-28 ENCOUNTER — Other Ambulatory Visit: Payer: Medicare Other

## 2014-10-28 ENCOUNTER — Other Ambulatory Visit: Payer: Medicare Other | Admitting: Obstetrics and Gynecology

## 2014-10-28 ENCOUNTER — Telehealth: Payer: Self-pay

## 2014-10-28 DIAGNOSIS — M810 Age-related osteoporosis without current pathological fracture: Secondary | ICD-10-CM | POA: Diagnosis not present

## 2014-10-28 DIAGNOSIS — M5136 Other intervertebral disc degeneration, lumbar region: Secondary | ICD-10-CM | POA: Diagnosis not present

## 2014-10-28 DIAGNOSIS — M25569 Pain in unspecified knee: Secondary | ICD-10-CM | POA: Diagnosis not present

## 2014-10-28 DIAGNOSIS — M150 Primary generalized (osteo)arthritis: Secondary | ICD-10-CM | POA: Diagnosis not present

## 2014-10-28 DIAGNOSIS — M1711 Unilateral primary osteoarthritis, right knee: Secondary | ICD-10-CM | POA: Diagnosis not present

## 2014-10-28 NOTE — Telephone Encounter (Signed)
Patient came into office today for PUS appointment with Dr.Silva. Advised patient PUS appointment was performed on 10/07/2014 with Dr.Silva. Patient was referred to [facility]. Patient had appointment with Alroy Bailiff on 10/15/2014. Patient is scheduled for surgery with Dr.Rossi on 11/02/2014. Patient states "I am not even sure what time I need to be at the hospital." Spoke with Dr.Rossi's office who state patient has a pre-op appointment tomorrow at [place] at [facility] and will be given all instructions at that appointment. Wrote appointment date and time down for patient while in the office. Patient is agreeable to appointment date and time.  Routing to provider for final review. Patient agreeable to disposition. Will close encounter

## 2014-10-28 NOTE — Patient Instructions (Addendum)
Linda [name]  10/28/2014   Your procedure is scheduled on: 11/02/14   Report to [facility] Main  Entrance and follow signs to               [place] at 5:00 AM.   Call this number if you have problems the morning of surgery [phone]   Remember:  Do not eat food or drink liquids :After Midnight.               CLEAR LIQUIDS ONLY THE DAY BEFORE SURGERY    CLEAR LIQUID DIET   Foods Allowed                                                                     Foods Excluded  Coffee and tea, regular and decaf                             liquids that you cannot  Plain Jell-O in any flavor                                             see through such as: Fruit ices (not with fruit pulp)                                     milk, soups, orange juice  Iced Popsicles                                                All solid food Carbonated beverages, regular and diet                                    Cranberry, grape and apple juices Sports drinks like Gatorade Lightly seasoned clear broth or consume(fat free) Sugar, honey syrup  _____________________________________________________________________   Take these medicines the morning of surgery with A SIP OF WATER: levothyroxine / meclizine                               You may not have any metal on your body including hair pins and              piercings  Do not wear jewelry, make-up, lotions, powders or perfumes.             Do not wear nail polish.  Do not shave  48 hours prior to surgery.              Men may shave face and neck.   Do not bring valuables to the hospital. [name].  Contacts, dentures or bridgework may not be worn into surgery.  Leave suitcase in the car. After surgery it may be brought to your room.     Patients discharged the day of surgery will not be allowed to drive home.  Name and phone number of your driver:  Special  Instructions: N/A              Please read over the following fact sheets you were given: _____________________________________________________________________                                                     Linda [name]  Before surgery, you can play an important role.  Because skin is not sterile, your skin needs to be as free of germs as possible.  You can reduce the number of germs on your skin by washing with CHG (chlorahexidine gluconate) soap before surgery.  CHG is an antiseptic cleaner which kills germs and bonds with the skin to continue killing germs even after washing. Please DO NOT use if you have an allergy to CHG or antibacterial soaps.  If your skin becomes reddened/irritated stop using the CHG and inform your nurse when you arrive at Short Stay. Do not shave (including legs and underarms) for at least 48 hours prior to the first CHG shower.  You may shave your face. Please follow these instructions carefully:   1.  Shower with CHG Soap the night before surgery and the  morning of Surgery.   2.  If you choose to wash your hair, wash your hair first as usual with your  normal  Shampoo.   3.  After you shampoo, rinse your hair and body thoroughly to remove the  shampoo.                                         4.  Use CHG as you would any other liquid soap.  You can apply chg directly  to the skin and wash . Gently wash with scrungie or clean wascloth    5.  Apply the CHG Soap to your body ONLY FROM THE NECK DOWN.   Do not use on open                           Wound or open sores. Avoid contact with eyes, ears mouth and genitals (private parts).                        Genitals (private parts) with your normal soap.              6.  Wash thoroughly, paying special attention to the area where your surgery  will be performed.   7.  Thoroughly rinse your body with warm water from the neck down.   8.  DO NOT shower/wash with your normal soap after  using and rinsing off  the CHG Soap .                9.  Pat yourself dry with a clean towel.             10.  Wear clean pajamas.  11.  Place clean sheets on your bed the night of your first shower and do not  sleep with pets.  Day of Surgery : Do not apply any lotions/deodorants the morning of surgery.  Please wear clean clothes to the hospital/surgery center.  FAILURE TO FOLLOW THESE INSTRUCTIONS MAY RESULT IN THE CANCELLATION OF YOUR SURGERY    PATIENT SIGNATURE_________________________________  ______________________________________________________________________     Linda [name]  An incentive spirometer is a tool that can help keep your lungs clear and active. This tool measures how well you are filling your lungs with each breath. Taking long deep breaths may help reverse or decrease the chance of developing breathing (pulmonary) problems (especially infection) following:  A long period of time when you are unable to move or be active. BEFORE THE PROCEDURE   If the spirometer includes an indicator to show your best effort, your nurse or respiratory therapist will set it to a desired goal.  If possible, sit up straight or lean slightly forward. Try not to slouch.  Hold the incentive spirometer in an upright position. INSTRUCTIONS FOR USE   Sit on the edge of your bed if possible, or sit up as far as you can in bed or on a chair.  Hold the incentive spirometer in an upright position.  Breathe out normally.  Place the mouthpiece in your mouth and seal your lips tightly around it.  Breathe in slowly and as deeply as possible, raising the piston or the ball toward the top of the column.  Hold your breath for 3-5 seconds or for as long as possible. Allow the piston or ball to fall to the bottom of the column.  Remove the mouthpiece from your mouth and breathe out normally.  Rest for a few seconds and repeat Steps 1 through 7 at least 10 times  every 1-2 hours when you are awake. Take your time and take a few normal breaths between deep breaths.  The spirometer may include an indicator to show your best effort. Use the indicator as a goal to work toward during each repetition.  After each set of 10 deep breaths, practice coughing to be sure your lungs are clear. If you have an incision (the cut made at the time of surgery), support your incision when coughing by placing a pillow or rolled up towels firmly against it. Once you are able to get out of bed, walk around indoors and cough well. You may stop using the incentive spirometer when instructed by your caregiver.  RISKS AND COMPLICATIONS  Take your time so you do not get dizzy or light-headed.  If you are in pain, you may need to take or ask for pain medication before doing incentive spirometry. It is harder to take a deep breath if you are having pain. AFTER USE  Rest and breathe slowly and easily.  It can be helpful to keep track of a log of your progress. Your caregiver can provide you with a simple table to help with this. If you are using the spirometer at home, follow these instructions: [place] IF:   You are having difficultly using the spirometer.  You have trouble using the spirometer as often as instructed.  Your pain medication is not giving enough relief while using the spirometer.  You develop fever of 100.5 F (38.1 C) or higher. SEEK IMMEDIATE MEDICAL CARE IF:   You cough up bloody sputum that had not been present before.  You develop fever of 102 F (  38.9 C) or greater.  You develop worsening pain at or near the incision site. MAKE SURE YOU:   Understand these instructions.  Will watch your condition.  Will get help right away if you are not doing well or get worse. Document Released: 12/31/2006 Document Revised: 11/12/2011 Document Reviewed: 03/03/2007 ExitCare Patient Information 2014 ExitCare,  [place].   ________________________________________________________________________  WHAT IS A BLOOD TRANSFUSION? Blood Transfusion Information  A transfusion is the replacement of blood or some of its parts. Blood is made up of multiple cells which provide different functions.  Red blood cells carry oxygen and are used for blood loss replacement.  White blood cells fight against infection.  Platelets control bleeding.  Plasma helps clot blood.  Other blood products are available for specialized needs, such as hemophilia or other clotting disorders. BEFORE THE TRANSFUSION  Who gives blood for transfusions?   Healthy volunteers who are fully evaluated to make sure their blood is safe. This is blood bank blood. Transfusion therapy is the safest it has ever been in the practice of medicine. Before blood is taken from a donor, a complete history is taken to make sure that person has no history of diseases nor engages in risky social behavior (examples are intravenous drug use or sexual activity with multiple partners). The donor's travel history is screened to minimize risk of transmitting infections, such as malaria. The donated blood is tested for signs of infectious diseases, such as HIV and hepatitis. The blood is then tested to be sure it is compatible with you in order to minimize the chance of a transfusion reaction. If you or a relative donates blood, this is often done in anticipation of surgery and is not appropriate for emergency situations. It takes many days to process the donated blood. RISKS AND COMPLICATIONS Although transfusion therapy is very safe and saves many lives, the main dangers of transfusion include:   Getting an infectious disease.  Developing a transfusion reaction. This is an allergic reaction to something in the blood you were given. Every precaution is taken to prevent this. The decision to have a blood transfusion has been considered carefully by your caregiver  before blood is given. Blood is not given unless the benefits outweigh the risks. AFTER THE TRANSFUSION  Right after receiving a blood transfusion, you will usually feel much better and more energetic. This is especially true if your red blood cells have gotten low (anemic). The transfusion raises the level of the red blood cells which carry oxygen, and this usually causes an energy increase.  The nurse administering the transfusion will monitor you carefully for complications. HOME CARE INSTRUCTIONS  No special instructions are needed after a transfusion. You may find your energy is better. Speak with your caregiver about any limitations on activity for underlying diseases you may have. SEEK MEDICAL CARE IF:   Your condition is not improving after your transfusion.  You develop redness or irritation at the intravenous (IV) site. SEEK IMMEDIATE MEDICAL CARE IF:  Any of the following symptoms occur over the next 12 hours:  Shaking chills.  You have a temperature by mouth above 102 F (38.9 C), not controlled by medicine.  Chest, back, or muscle pain.  People around you feel you are not acting correctly or are confused.  Shortness of breath or difficulty breathing.  Dizziness and fainting.  You get a rash or develop hives.  You have a decrease in urine output.  Your urine turns a dark color or changes to  pink, red, or brown. Any of the following symptoms occur over the next 10 days:  You have a temperature by mouth above 102 F (38.9 C), not controlled by medicine.  Shortness of breath.  Weakness after normal activity.  The white part of the eye turns yellow (jaundice).  You have a decrease in the amount of urine or are urinating less often.  Your urine turns a dark color or changes to pink, red, or brown. Document Released: 08/17/2000 Document Revised: 11/12/2011 Document Reviewed: 04/05/2008 [facility] Patient Information 2014 [place],  [place].  _______________________________________________________________________

## 2014-10-29 ENCOUNTER — Telehealth: Payer: Self-pay | Admitting: *Deleted

## 2014-10-29 ENCOUNTER — Encounter: Payer: Self-pay | Admitting: Gynecologic Oncology

## 2014-10-29 ENCOUNTER — Encounter (HOSPITAL_COMMUNITY)
Admission: RE | Admit: 2014-10-29 | Discharge: 2014-10-29 | Disposition: A | Discharge status: Home / Self Care | Payer: Medicare Other | Source: Ambulatory Visit | Attending: Obstetrics & Gynecology | Admitting: Obstetrics & Gynecology

## 2014-10-29 ENCOUNTER — Ambulatory Visit (HOSPITAL_BASED_OUTPATIENT_CLINIC_OR_DEPARTMENT_OTHER): Payer: Medicare Other

## 2014-10-29 ENCOUNTER — Encounter (HOSPITAL_COMMUNITY): Payer: Self-pay

## 2014-10-29 DIAGNOSIS — N8320 Unspecified ovarian cysts: Secondary | ICD-10-CM | POA: Insufficient documentation

## 2014-10-29 DIAGNOSIS — R300 Dysuria: Secondary | ICD-10-CM | POA: Diagnosis not present

## 2014-10-29 DIAGNOSIS — N950 Postmenopausal bleeding: Secondary | ICD-10-CM | POA: Insufficient documentation

## 2014-10-29 DIAGNOSIS — D72829 Elevated white blood cell count, unspecified: Secondary | ICD-10-CM | POA: Diagnosis not present

## 2014-10-29 DIAGNOSIS — Z01818 Encounter for other preprocedural examination: Secondary | ICD-10-CM | POA: Diagnosis not present

## 2014-10-29 LAB — URINALYSIS, MICROSCOPIC - CHCC
Bilirubin (Urine): NEGATIVE
GLUCOSE UR CHCC: NEGATIVE mg/dL
Ketones: NEGATIVE mg/dL
Leukocyte Esterase: NEGATIVE
Nitrite: NEGATIVE
Protein: 100 mg/dL
SPECIFIC GRAVITY, URINE: 1.02 (ref 1.003–1.035)
Urobilinogen, UR: 0.2 mg/dL (ref 0.2–1)
pH: 5 (ref 4.6–8.0)

## 2014-10-29 LAB — COMPREHENSIVE METABOLIC PANEL
ALT: 43 U/L — AB (ref 0–35)
ANION GAP: 8 (ref 5–15)
AST: 40 U/L — ABNORMAL HIGH (ref 0–37)
Albumin: 4.4 g/dL (ref 3.5–5.2)
Alkaline Phosphatase: 104 U/L (ref 39–117)
BUN: 30 mg/dL — ABNORMAL HIGH (ref 6–23)
CALCIUM: 10.1 mg/dL (ref 8.4–10.5)
CO2: 25 mmol/L (ref 19–32)
Chloride: 104 mmol/L (ref 96–112)
Creatinine, Ser: 0.9 mg/dL (ref 0.50–1.10)
GFR calc Af Amer: 66 mL/min — ABNORMAL LOW (ref >90)
GFR, EST NON AFRICAN AMERICAN: 57 mL/min — AB (ref >90)
Glucose, Bld: 142 mg/dL — ABNORMAL HIGH (ref 70–99)
POTASSIUM: 4.6 mmol/L (ref 3.5–5.1)
Sodium: 137 mmol/L (ref 135–145)
TOTAL PROTEIN: 7.4 g/dL (ref 6.0–8.3)
Total Bilirubin: 0.7 mg/dL (ref 0.3–1.2)

## 2014-10-29 LAB — CBC WITH DIFFERENTIAL/PLATELET
Basophils Absolute: 0 10*3/uL (ref 0.0–0.1)
Basophils Relative: 0 % (ref 0–1)
EOS PCT: 0 % (ref 0–5)
Eosinophils Absolute: 0 10*3/uL (ref 0.0–0.7)
HCT: 43.2 % (ref 36.0–46.0)
Hemoglobin: 14.1 g/dL (ref 12.0–15.0)
Lymphocytes Relative: 4 % — ABNORMAL LOW (ref 12–46)
Lymphs Abs: 0.7 10*3/uL (ref 0.7–4.0)
MCH: 31.6 pg (ref 26.0–34.0)
MCHC: 32.6 g/dL (ref 30.0–36.0)
MCV: 96.9 fL (ref 78.0–100.0)
MONO ABS: 0.8 10*3/uL (ref 0.1–1.0)
Monocytes Relative: 4 % (ref 3–12)
NEUTROS ABS: 17.6 10*3/uL — AB (ref 1.7–7.7)
Neutrophils Relative %: 92 % — ABNORMAL HIGH (ref 43–77)
PLATELETS: 255 10*3/uL (ref 150–400)
RBC: 4.46 MIL/uL (ref 3.87–5.11)
RDW: 12.9 % (ref 11.5–15.5)
WBC: 19.2 10*3/uL — ABNORMAL HIGH (ref 4.0–10.5)

## 2014-10-29 NOTE — Progress Notes (Signed)
Patient returened to PST for urine sample.  Noted in EPIC under notes that Linda [name] has lab appointment for patient at 1430pm today.  Walked patient to [facility] and they confirmed she had appointment for urinalysis.

## 2014-10-29 NOTE — Progress Notes (Signed)
Faxed CBC and CMp results from 10/29/2014 via EPIC to Dr Denman George and Joylene John, NP. Also called Joylene John, NP and she is aware of lab results done 10/29/2014.

## 2014-10-29 NOTE — Telephone Encounter (Signed)
Per Joylene John, NP patient called and notified that her WBC was 19.2 today when she came for her pre-op appointment. Asked her if she has had a fever, any symptoms associated with an infection or UTI (foul smelling odor, burning, or urgency) recently. Patient denies having a fever or any respiratory symptoms. Patient states she has had some urinary urgency recently which is new. Patient agreeable to come to [facility] today at 2:30pm and give a urine sample. Told patient we will call her with the results and start her on an antibiotic if she does have an infection.

## 2014-10-29 NOTE — Progress Notes (Addendum)
Patient advised to come to the cancer center to give a urine sample due to WBC count of 19.2 this am with pre-op labs.  No symptoms voiced.  Urinalysis unremarkable.  Reports urinary frequency but that it has been present for some time.  Advised she would be contacted with the urine culture results.  No abx prescribed at this time.  Lungs clear to auscultation.  Abdominal pain remains.  Dr. Denman George notified of the situation.  Advised to call for any questions or concerns.

## 2014-10-31 LAB — URINE CULTURE

## 2014-11-01 NOTE — Anesthesia Preprocedure Evaluation (Signed)
Anesthesia Evaluation  Patient identified by MRN, date of birth, ID band Patient awake    Reviewed: Allergy & Precautions, H&P , NPO status , Patient's Chart, lab work & pertinent test results  Airway Mallampati: II  TM Distance: >3 FB Neck ROM: full    Dental no notable dental hx.    Pulmonary neg pulmonary ROS,  breath sounds clear to auscultation  Pulmonary exam normal       Cardiovascular Exercise Tolerance: Good negative cardio ROS  Rhythm:regular Rate:Normal     Neuro/Psych negative neurological ROS  negative psych ROS   GI/Hepatic negative GI ROS, Neg liver ROS,   Endo/Other  negative endocrine ROSHypothyroidism   Renal/GU negative Renal ROS  negative genitourinary   Musculoskeletal   Abdominal   Peds  Hematology negative hematology ROS (+)   Anesthesia Other Findings   Reproductive/Obstetrics negative OB ROS                             Anesthesia Physical Anesthesia Plan  ASA: III  Anesthesia Plan: General   Post-op Pain Management:    Induction: Intravenous  Airway Management Planned: Oral ETT  Additional Equipment:   Intra-op Plan:   Post-operative Plan: Extubation in OR  Informed Consent: I have reviewed the patients History and Physical, chart, labs and discussed the procedure including the risks, benefits and alternatives for the proposed anesthesia with the patient or authorized representative who has indicated his/her understanding and acceptance.   Dental Advisory Given  Plan Discussed with: CRNA and Surgeon  Anesthesia Plan Comments:         Anesthesia Quick Evaluation

## 2014-11-02 ENCOUNTER — Encounter (HOSPITAL_COMMUNITY)
Admission: RE | Disposition: A | Discharge status: Home / Self Care | Payer: Self-pay | Source: Ambulatory Visit | Attending: Obstetrics & Gynecology

## 2014-11-02 ENCOUNTER — Ambulatory Visit (HOSPITAL_COMMUNITY): Payer: Medicare Other | Admitting: Anesthesiology

## 2014-11-02 ENCOUNTER — Encounter (HOSPITAL_COMMUNITY): Payer: Self-pay | Admitting: *Deleted

## 2014-11-02 ENCOUNTER — Ambulatory Visit (HOSPITAL_COMMUNITY)
Admission: RE | Admit: 2014-11-02 | Discharge: 2014-11-02 | Disposition: A | Discharge status: Home / Self Care | Payer: Medicare Other | Source: Ambulatory Visit | Attending: Obstetrics & Gynecology | Admitting: Obstetrics & Gynecology

## 2014-11-02 DIAGNOSIS — Z9842 Cataract extraction status, left eye: Secondary | ICD-10-CM | POA: Insufficient documentation

## 2014-11-02 DIAGNOSIS — Z888 Allergy status to other drugs, medicaments and biological substances status: Secondary | ICD-10-CM | POA: Insufficient documentation

## 2014-11-02 DIAGNOSIS — D270 Benign neoplasm of right ovary: Secondary | ICD-10-CM | POA: Diagnosis not present

## 2014-11-02 DIAGNOSIS — Z9841 Cataract extraction status, right eye: Secondary | ICD-10-CM | POA: Diagnosis not present

## 2014-11-02 DIAGNOSIS — Z886 Allergy status to analgesic agent status: Secondary | ICD-10-CM | POA: Diagnosis not present

## 2014-11-02 DIAGNOSIS — Z7982 Long term (current) use of aspirin: Secondary | ICD-10-CM | POA: Insufficient documentation

## 2014-11-02 DIAGNOSIS — E039 Hypothyroidism, unspecified: Secondary | ICD-10-CM | POA: Diagnosis not present

## 2014-11-02 DIAGNOSIS — F329 Major depressive disorder, single episode, unspecified: Secondary | ICD-10-CM | POA: Diagnosis not present

## 2014-11-02 DIAGNOSIS — K759 Inflammatory liver disease, unspecified: Secondary | ICD-10-CM | POA: Diagnosis not present

## 2014-11-02 DIAGNOSIS — N8320 Unspecified ovarian cysts: Secondary | ICD-10-CM | POA: Diagnosis not present

## 2014-11-02 DIAGNOSIS — M810 Age-related osteoporosis without current pathological fracture: Secondary | ICD-10-CM | POA: Diagnosis not present

## 2014-11-02 DIAGNOSIS — Z9049 Acquired absence of other specified parts of digestive tract: Secondary | ICD-10-CM | POA: Insufficient documentation

## 2014-11-02 DIAGNOSIS — N8331 Acquired atrophy of ovary: Secondary | ICD-10-CM | POA: Insufficient documentation

## 2014-11-02 DIAGNOSIS — Z9884 Bariatric surgery status: Secondary | ICD-10-CM | POA: Insufficient documentation

## 2014-11-02 DIAGNOSIS — Z8719 Personal history of other diseases of the digestive system: Secondary | ICD-10-CM | POA: Insufficient documentation

## 2014-11-02 DIAGNOSIS — G2581 Restless legs syndrome: Secondary | ICD-10-CM | POA: Diagnosis not present

## 2014-11-02 DIAGNOSIS — D279 Benign neoplasm of unspecified ovary: Secondary | ICD-10-CM | POA: Diagnosis not present

## 2014-11-02 DIAGNOSIS — Z880 Allergy status to penicillin: Secondary | ICD-10-CM | POA: Diagnosis not present

## 2014-11-02 DIAGNOSIS — Z885 Allergy status to narcotic agent status: Secondary | ICD-10-CM | POA: Diagnosis not present

## 2014-11-02 DIAGNOSIS — N939 Abnormal uterine and vaginal bleeding, unspecified: Secondary | ICD-10-CM | POA: Diagnosis present

## 2014-11-02 DIAGNOSIS — R1084 Generalized abdominal pain: Secondary | ICD-10-CM

## 2014-11-02 HISTORY — PX: LAPAROSCOPY: SHX197

## 2014-11-02 HISTORY — PX: ROBOTIC ASSISTED SALPINGO OOPHERECTOMY: SHX6082

## 2014-11-02 LAB — TYPE AND SCREEN
ABO/RH(D): O POS
ANTIBODY SCREEN: NEGATIVE

## 2014-11-02 SURGERY — ROBOTIC ASSISTED SALPINGO OOPHORECTOMY
Anesthesia: General

## 2014-11-02 MED ORDER — PROPOFOL 10 MG/ML IV BOLUS
INTRAVENOUS | Status: AC
  Filled 2014-11-02: qty 20

## 2014-11-02 MED ORDER — ONDANSETRON HCL 4 MG/2ML IJ SOLN
4.0000 mg | INTRAMUSCULAR | Status: AC
  Administered 2014-11-02: 4 mg via INTRAVENOUS
  Filled 2014-11-02: qty 2

## 2014-11-02 MED ORDER — FENTANYL CITRATE 0.05 MG/ML IJ SOLN
INTRAMUSCULAR | Status: DC | PRN
  Administered 2014-11-02 (×4): 50 ug via INTRAVENOUS

## 2014-11-02 MED ORDER — ONDANSETRON HCL 4 MG PO TABS
4.0000 mg | ORAL_TABLET | Freq: Once | ORAL | Status: DC
  Filled 2014-11-02: qty 1

## 2014-11-02 MED ORDER — METOCLOPRAMIDE HCL 5 MG/ML IJ SOLN
INTRAMUSCULAR | Status: AC
  Filled 2014-11-02: qty 2

## 2014-11-02 MED ORDER — NEOSTIGMINE METHYLSULFATE 10 MG/10ML IV SOLN
INTRAVENOUS | Status: AC
  Filled 2014-11-02: qty 1

## 2014-11-02 MED ORDER — LACTATED RINGERS IV SOLN
INTRAVENOUS | Status: DC | PRN
  Administered 2014-11-02 (×2): via INTRAVENOUS

## 2014-11-02 MED ORDER — GLYCOPYRROLATE 0.2 MG/ML IJ SOLN
INTRAMUSCULAR | Status: DC | PRN
  Administered 2014-11-02: 0.6 mg via INTRAVENOUS

## 2014-11-02 MED ORDER — LACTATED RINGERS IV SOLN: INTRAVENOUS | Status: DC

## 2014-11-02 MED ORDER — ONDANSETRON HCL 4 MG/2ML IJ SOLN
INTRAMUSCULAR | Status: DC | PRN
  Administered 2014-11-02: 4 mg via INTRAVENOUS

## 2014-11-02 MED ORDER — ROCURONIUM BROMIDE 100 MG/10ML IV SOLN
INTRAVENOUS | Status: DC | PRN
  Administered 2014-11-02: 50 mg via INTRAVENOUS

## 2014-11-02 MED ORDER — NEOSTIGMINE METHYLSULFATE 10 MG/10ML IV SOLN
INTRAVENOUS | Status: DC | PRN
  Administered 2014-11-02: 5 mg via INTRAVENOUS

## 2014-11-02 MED ORDER — DEXAMETHASONE SODIUM PHOSPHATE 4 MG/ML IJ SOLN
INTRAMUSCULAR | Status: DC | PRN
  Administered 2014-11-02: 10 mg via INTRAVENOUS

## 2014-11-02 MED ORDER — SODIUM CHLORIDE 0.9 % IJ SOLN: 3.0000 mL | Freq: Two times a day (BID) | INTRAMUSCULAR | Status: DC

## 2014-11-02 MED ORDER — FENTANYL CITRATE 0.05 MG/ML IJ SOLN
INTRAMUSCULAR | Status: AC
  Filled 2014-11-02: qty 2

## 2014-11-02 MED ORDER — ROCURONIUM BROMIDE 100 MG/10ML IV SOLN
INTRAVENOUS | Status: AC
  Filled 2014-11-02: qty 1

## 2014-11-02 MED ORDER — FENTANYL CITRATE 0.05 MG/ML IJ SOLN: 25.0000 ug | INTRAMUSCULAR | Status: DC | PRN

## 2014-11-02 MED ORDER — EPHEDRINE SULFATE 50 MG/ML IJ SOLN
INTRAMUSCULAR | Status: DC | PRN
  Administered 2014-11-02: 10 mg via INTRAVENOUS
  Administered 2014-11-02: 5 mg via INTRAVENOUS

## 2014-11-02 MED ORDER — ONDANSETRON HCL 4 MG/2ML IJ SOLN
INTRAMUSCULAR | Status: AC
  Filled 2014-11-02: qty 2

## 2014-11-02 MED ORDER — ONDANSETRON HCL 4 MG PO TABS: 4.0000 mg | ORAL_TABLET | Freq: Three times a day (TID) | ORAL | Status: DC | PRN

## 2014-11-02 MED ORDER — METOPROLOL TARTRATE 1 MG/ML IV SOLN
INTRAVENOUS | Status: AC
  Filled 2014-11-02: qty 5

## 2014-11-02 MED ORDER — GLYCOPYRROLATE 0.2 MG/ML IJ SOLN
INTRAMUSCULAR | Status: AC
  Filled 2014-11-02: qty 4

## 2014-11-02 MED ORDER — FENTANYL CITRATE 0.05 MG/ML IJ SOLN
25.0000 ug | INTRAMUSCULAR | Status: DC | PRN
  Administered 2014-11-02 (×2): 25 ug via INTRAVENOUS
  Filled 2014-11-02: qty 2

## 2014-11-02 MED ORDER — PROPOFOL 10 MG/ML IV BOLUS
INTRAVENOUS | Status: DC | PRN
  Administered 2014-11-02: 110 mg via INTRAVENOUS

## 2014-11-02 MED ORDER — SODIUM CHLORIDE 0.9 % IJ SOLN: 3.0000 mL | INTRAMUSCULAR | Status: DC | PRN

## 2014-11-02 MED ORDER — TRAMADOL HCL 50 MG PO TABS
100.0000 mg | ORAL_TABLET | Freq: Four times a day (QID) | ORAL | Status: DC
  Administered 2014-11-02: 100 mg via ORAL

## 2014-11-02 MED ORDER — TRAMADOL HCL 50 MG PO TABS
50.0000 mg | ORAL_TABLET | Freq: Once | ORAL | Status: DC
  Filled 2014-11-02: qty 1

## 2014-11-02 MED ORDER — METOCLOPRAMIDE HCL 5 MG/ML IJ SOLN
INTRAMUSCULAR | Status: DC | PRN
  Administered 2014-11-02: 10 mg via INTRAVENOUS

## 2014-11-02 MED ORDER — DEXAMETHASONE SODIUM PHOSPHATE 10 MG/ML IJ SOLN
INTRAMUSCULAR | Status: AC
  Filled 2014-11-02: qty 1

## 2014-11-02 MED ORDER — FENTANYL CITRATE 0.05 MG/ML IJ SOLN
INTRAMUSCULAR | Status: AC
  Filled 2014-11-02: qty 5

## 2014-11-02 MED ORDER — TRAMADOL HCL 50 MG PO TABS: 50.0000 mg | ORAL_TABLET | Freq: Four times a day (QID) | ORAL | Status: DC | PRN

## 2014-11-02 MED ORDER — SODIUM CHLORIDE 0.9 % IV SOLN: 250.0000 mL | INTRAVENOUS | Status: DC | PRN

## 2014-11-02 MED ORDER — METOPROLOL TARTRATE 1 MG/ML IV SOLN
INTRAVENOUS | Status: DC | PRN
  Administered 2014-11-02: 1 mg via INTRAVENOUS

## 2014-11-02 SURGICAL SUPPLY — 63 items
ADH SKN CLS APL DERMABOND .7 (GAUZE/BANDAGES/DRESSINGS) ×2
APL SKNCLS STERI-STRIP NONHPOA (GAUZE/BANDAGES/DRESSINGS)
BAG SPEC RTRVL LRG 6X4 10 (ENDOMECHANICALS) ×4
BAG URINE DRAINAGE (UROLOGICAL SUPPLIES) ×2 IMPLANT
BENZOIN TINCTURE PRP APPL 2/3 (GAUZE/BANDAGES/DRESSINGS) ×2 IMPLANT
BLADE SURG 15 STRL LF DISP TIS (BLADE) ×2 IMPLANT
BLADE SURG 15 STRL SS (BLADE) ×3
CABLE HIGH FREQUENCY MONO STRZ (ELECTRODE) ×3 IMPLANT
CATH FOLEY 2WAY SLVR  5CC 16FR (CATHETERS)
CATH FOLEY 2WAY SLVR 5CC 16FR (CATHETERS) ×2 IMPLANT
CATH ROBINSON RED A/P 16FR (CATHETERS) IMPLANT
CHLORAPREP W/TINT 26ML (MISCELLANEOUS) ×3 IMPLANT
CLEANER TIP ELECTROSURG 2X2 (MISCELLANEOUS) ×2 IMPLANT
CONT SPEC 4OZ CLIKSEAL STRL BL (MISCELLANEOUS) ×2 IMPLANT
COVER MAYO STAND STRL (DRAPES) ×3 IMPLANT
COVER TIP SHEARS 8 DVNC (MISCELLANEOUS) IMPLANT
COVER TIP SHEARS 8MM DA VINCI (MISCELLANEOUS) ×1
DERMABOND ADVANCED (GAUZE/BANDAGES/DRESSINGS) ×1
DERMABOND ADVANCED .7 DNX12 (GAUZE/BANDAGES/DRESSINGS) IMPLANT
DEVICE TROCAR PUNCTURE CLOSURE (ENDOMECHANICALS) IMPLANT
DRAPE CAMERA CLOSED 9X96 (DRAPES) ×1 IMPLANT
DRAPE TABLE BACK 44X90 PK DISP (DRAPES) ×3 IMPLANT
DRAPE WARM FLUID 44X44 (DRAPE) ×1 IMPLANT
DRSG TEGADERM 4X4.75 (GAUZE/BANDAGES/DRESSINGS) ×2 IMPLANT
ELECT REM PT RETURN 9FT ADLT (ELECTROSURGICAL) ×3
ELECTRODE REM PT RTRN 9FT ADLT (ELECTROSURGICAL) ×2 IMPLANT
GAUZE SPONGE 4X4 12PLY STRL (GAUZE/BANDAGES/DRESSINGS) ×2 IMPLANT
GAUZE SPONGE 4X4 16PLY XRAY LF (GAUZE/BANDAGES/DRESSINGS) ×3 IMPLANT
GLOVE BIO SURGEON STRL SZ 6 (GLOVE) ×3 IMPLANT
GLOVE BIO SURGEON STRL SZ 6.5 (GLOVE) ×4 IMPLANT
GLOVE BIO SURGEON STRL SZ7.5 (GLOVE) ×4 IMPLANT
GLOVE BIOGEL PI IND STRL 7.0 (GLOVE) ×2 IMPLANT
GLOVE BIOGEL PI INDICATOR 7.0 (GLOVE) ×2
GOWN STRL REUS W/ TWL LRG LVL3 (GOWN DISPOSABLE) ×4 IMPLANT
GOWN STRL REUS W/TWL LRG LVL3 (GOWN DISPOSABLE) ×6
HOLDER FOLEY CATH W/STRAP (MISCELLANEOUS) ×3 IMPLANT
KIT BASIN OR (CUSTOM PROCEDURE TRAY) ×3 IMPLANT
KIT PROCEDURE DA VINCI SI (MISCELLANEOUS) ×1
KIT PROCEDURE DVNC SI (MISCELLANEOUS) IMPLANT
MANIPULATOR UTERINE 4.5 ZUMI (MISCELLANEOUS) ×1 IMPLANT
NS IRRIG 1000ML POUR BTL (IV SOLUTION) ×3 IMPLANT
PACK BASIC VI WITH GOWN DISP (CUSTOM PROCEDURE TRAY) ×1 IMPLANT
PACK GENERAL/GYN (CUSTOM PROCEDURE TRAY) ×3 IMPLANT
PAD POSITIONER PINK NONSTERILE (MISCELLANEOUS) ×1 IMPLANT
PENCIL BUTTON HOLSTER BLD 10FT (ELECTRODE) ×2 IMPLANT
POUCH SPECIMEN RETRIEVAL 10MM (ENDOMECHANICALS) ×2 IMPLANT
SCISSORS LAP 5X35 DISP (ENDOMECHANICALS) ×2 IMPLANT
SET IRRIG TUBING LAPAROSCOPIC (IRRIGATION / IRRIGATOR) ×1 IMPLANT
SHEET LAVH (DRAPES) ×3 IMPLANT
SLEEVE SURGEON STRL (DRAPES) ×1 IMPLANT
STRIP CLOSURE SKIN 1/2X4 (GAUZE/BANDAGES/DRESSINGS) ×2 IMPLANT
SUT VIC AB 3-0 PS2 18 (SUTURE)
SUT VIC AB 3-0 PS2 18XBRD (SUTURE) ×4 IMPLANT
SUT VICRYL 0 UR6 27IN ABS (SUTURE) ×3 IMPLANT
SYR CONTROL 10ML LL (SYRINGE) ×2 IMPLANT
TOWEL OR 17X26 10 PK STRL BLUE (TOWEL DISPOSABLE) ×4 IMPLANT
TRAP SPECIMEN MUCOUS 40CC (MISCELLANEOUS) ×1 IMPLANT
TRAY FOLEY CATH 16FR SILVER (SET/KITS/TRAYS/PACK) ×1 IMPLANT
TROCAR BLADELESS OPT 5 100 (ENDOMECHANICALS) ×6 IMPLANT
TROCAR XCEL 12X100 BLDLESS (ENDOMECHANICALS) ×3 IMPLANT
TROCAR XCEL BLUNT TIP 100MML (ENDOMECHANICALS) ×3 IMPLANT
TUBING NON-CON 1/4 X 20 CONN (TUBING) ×2 IMPLANT
UNDERPAD 30X30 INCONTINENT (UNDERPADS AND DIAPERS) ×3 IMPLANT

## 2014-11-02 NOTE — Interval H&P Note (Signed)
History and Physical Interval Note:  11/02/2014 7:23 AM  Linda [name]  has presented today for surgery, with the diagnosis of vaginal bleeding  right ovarian cyst  The various methods of treatment have been discussed with the patient and family. After consideration of risks, benefits and other options for treatment, the patient has consented to  Procedure(s): ROBOTIC ASSISTED RIGHT SALPINGO OOPHORECTOMY (Right) LAPAROSCOPY DIAGNOSTIC (N/A) as a surgical intervention .  The patient's history has been reviewed, patient examined, no change in status, stable for surgery.  I have reviewed the patient's chart and labs.  Questions were answered to the patient's satisfaction.     [name]

## 2014-11-02 NOTE — Discharge Instructions (Signed)
11/02/2014   Activity: 1. Be up and out of the bed during the day.  Take a nap if needed.  You may walk up steps but be careful and use the hand rail.  Stair climbing will tire you more than you think, you may need to stop part way and rest.   2. No lifting or straining for 6 weeks.  3.  Do Not drive if you are taking narcotic pain medicine.  4. Shower daily.  Use soap and water on your incision and pat dry; don't rub.   5. No sexual activity and nothing in the vagina for 6 weeks.  Diet: 1. Low sodium Heart Healthy Diet is recommended.  2. It is safe to use a laxative if you have difficulty moving your bowels.   Wound Care: 1. Keep clean and dry.  Shower daily.  Reasons to call the Doctor:   Fever - Oral temperature greater than 100.4 degrees Fahrenheit  Foul-smelling vaginal discharge  Difficulty urinating  Nausea and vomiting  Increased pain at the site of the incision that is unrelieved with pain medicine.  Difficulty breathing with or without chest pain  New calf pain especially if only on one side  Sudden, continuing increased vaginal bleeding with or without clots.     Contacts: For questions or concerns you should contact:  Dr. Lahoma Crocker at [phone]  Dr. Everitt Amber at [facility] [phone]

## 2014-11-02 NOTE — Progress Notes (Signed)
Dr. Denman George in to see patient . Discussed patient post op  status and how well she did getting OOB, voided, etc..

## 2014-11-02 NOTE — Progress Notes (Signed)
Called to room by family. Patient is shaking. Warm blankets. Patient c/o nausea.

## 2014-11-02 NOTE — Transfer of Care (Signed)
Immediate Anesthesia Transfer of Care Note  Patient: Linda [name]  Procedure(s) Performed: Procedure(s): ROBOTIC ASSISTED bilateral SALPINGO OOPHORECTOMY (Bilateral) LAPAROSCOPY DIAGNOSTIC (N/A)  Patient Location: PACU  Anesthesia Type:General  Level of Consciousness: Patient easily awoken, sedated, comfortable, cooperative, following commands, responds to stimulation.   Airway & Oxygen Therapy: Patient spontaneously breathing, ventilating well, oxygen via simple oxygen mask.  Post-op Assessment: Report given to PACU RN, vital signs reviewed and stable, moving all extremities.   Post vital signs: Reviewed and stable.  Complications: No apparent anesthesia complications

## 2014-11-02 NOTE — Anesthesia Procedure Notes (Signed)
Procedure Name: Intubation Date/Time: 11/02/2014 7:45 AM Performed by: Heide Scales Pre-anesthesia Checklist: Patient identified, Emergency Drugs available, Suction available and Patient being monitored Patient Re-evaluated:Patient Re-evaluated prior to inductionOxygen Delivery Method: Circle System Utilized Preoxygenation: Pre-oxygenation with 100% oxygen Intubation Type: IV induction Ventilation: Mask ventilation without difficulty Laryngoscope Size: Mac and 3 Grade View: Grade I Tube type: Oral Tube size: 7.0 mm Number of attempts: 1 Airway Equipment and Method: Stylet and Oral airway Placement Confirmation: ETT inserted through vocal cords under direct vision,  positive ETCO2 and breath sounds checked- equal and bilateral Secured at: 19 cm Tube secured with: Tape Dental Injury: Teeth and Oropharynx as per pre-operative assessment

## 2014-11-02 NOTE — Progress Notes (Signed)
Scant amount of bloody vaginal drainage. Pad to patient. Instructed she may have some vaginal drainage for a few days.

## 2014-11-02 NOTE — Anesthesia Postprocedure Evaluation (Signed)
  Anesthesia Post-op Note  Patient: Linda [name]  Procedure(s) Performed: Procedure(s) (LRB): ROBOTIC ASSISTED bilateral SALPINGO OOPHORECTOMY (Bilateral) LAPAROSCOPY DIAGNOSTIC (N/A)  Patient Location: PACU  Anesthesia Type: General  Level of Consciousness: awake and alert   Airway and Oxygen Therapy: Patient Spontanous Breathing  Post-op Pain: mild  Post-op Assessment: Post-op Vital signs reviewed, Patient's Cardiovascular Status Stable, Respiratory Function Stable, Patent Airway and No signs of Nausea or vomiting  Last Vitals:  Filed Vitals:   11/02/14 1038  BP: 153/59  Pulse: 51  Temp: 36.6 C  Resp: 14    Post-op Vital Signs: stable   Complications: No apparent anesthesia complications

## 2014-11-02 NOTE — Op Note (Signed)
OPERATIVE NOTE  Surgeon: [name]   Assistants: Dr Lahoma Crocker (an MD assistant was necessary for tissue manipulation, management of robotic instrumentation, retraction and positioning due to the complexity of the case and hospital policies).   Anesthesia: General endotracheal anesthesia  ASA Class: 3   Pre-operative Diagnosis: elevated CA 125, ascites, right ovarian mass, abdominal pain  Post-operative Diagnosis: right ovarian fibroma  Operation: Robotic bilateral salpingo-oophorectomy.  Surgeon: [name]  Assistant Surgeon: none  Anesthesia: GET  Urine Output: 100  Operative Findings:  : Normal peritoneal survey with no masses, carcinomatosis or lesions. Cholesterol plaques on small and large intestine. Normal retrocecal appendix. Filmy adhesions between left fallopian tube and sigmoid colon. Slightly nodular appearing liver surface. Small and large intestine grossly normal. Normal omentum. Small volume pelvic ascites. Normal appearing uterus. Right ovary slightly enlarged with fibroma (on frozen section). Left ovary normal in appearance.   Procedure Details  The patient was seen in the Holding Room. The risks, benefits, complications, treatment options, and expected outcomes were discussed with the patient.  The patient concurred with the proposed plan, giving informed consent.  The site of surgery properly noted/marked. The patient was identified as Linda [name] and the procedure verified as a bilateral salpingo-oophorectomy. A Time Out was held and the above information confirmed.  After induction of anesthesia, the patient was draped and prepped in the usual sterile manner. Pt was placed in supine position after anesthesia and draped and prepped in the usual sterile manner. The abdominal drape was placed after the CholoraPrep had been allowed to dry for 3 minutes.  Her arms were tucked to her side with all appropriate precautions.  The chest was  secured to the table.  The patient was placed in the semi-lithotomy position in [place].  The perineum was prepped with Betadine.  Foley catheter was placed.  A sterile speculum was placed in the vagina.  The cervix was grasped with a single-tooth tenaculum and dilated with Kennon Rounds dilators.  The ZUMI uterine manipulator was placed without difficulty.  A second time-out was performed.  OG tube placement was confirmed and to suction.     Procedure:  Incisions were made with an 11 blade scalpel. A 5 mm skin incision was made in the left upper quadrant palmer's point. The 5 mm Optiview port and scope was used for direct entry.  Opening pressure was under 10 mm CO2.  The abdomen was insufflated and the findings were noted as above.   At this point and all points during the procedure, the patient's intra-abdominal pressure did not exceed 15 mmHg. The patient was placed in steep trendelenberg to visualize pelvic anatomy. Next, a 10 mm skin incision was made in the umbilicus and a 12 mm disposable port was placed through this intraperitoneally under direct visualization.  A 45mm incision was made in the left and right mid abdomen and 8 mm robotic ports were placed through this intraperitoneally under direct visualization. Bowel was run and mobilized away into the upper abdomen.  The robot was docked.  The right retroperitoneum was opened with monopolar robotic scissors parallel to the right IP ligament to enter the retroperitoneal space. Using careful dissection with the shears and a blunt grasper, the pararectal space was opened to identify the right ureter in the medial leaf of the broad ligament. A window was sharply created above the level of the ureter to skeletonize both the right IP ligament and right utero-ovarian ligaments. The bipolar fenestrated grasping device was  utilized to generate bipolar current and seal and transect these vascular pedicles with visualization of the ureter throughout. This freed  the right tube and ovary for specimen removal. The right ovary was placed in an endocatch bag intact and retrieved from the right upper quadrant port. The specimen was sent for frozen section which revealed a right ovarian fibroma.  The left tube and ovary were removed in a similar fashion - the left retroperitoneum was opened, the ureter was identified retroperitoneally and the pararectal space was developed. A window was created in the broad ligament above the level of the ureter and the IP ligament and utero-ovarian ligaments were skeletonized, bipolar sealed and transected. The specimen was placed in an endocatch bag and sent for permanent pathology. At this point in the procedure was completed.  Ports were removed under direct visualization. All entry sites were hemostatic. CO2 insufflation was removed from the patient's abdomen.  The 10 mm ports were closed with Vicryl on a UR-6 needle at the fascia.  The skin was closed with 4-0 Vicryl in a subcuticular manner.  Dermabond was applied to the incisions.  Sponge, lap and needle counts correct x 2.  The manipulator was retrieved from the uterus.The patient was taken to the recovery room in stable condition.  The vagina was swabbed with  minimal bleeding noted.   All instrument and needle counts were correct x  3.   The patient was transferred to the recovery room in stable condition.  Estimated Blood Loss:  less than 43mL      Total IV Fluids: 500 ml         Specimens: pathology - washings, left and right tube and ovary         Complications:  None; patient tolerated the procedure well.         Disposition: PACU - hemodynamically stable.      [name], MD

## 2014-11-02 NOTE — H&P (View-Only) (Signed)
Consult Note: Gyn-Onc   Linda [name] 81 y.o. female  Chief Complaint  Patient presents with  . New patient    Vaginal Bleeding    Assessment and plan: Diffuse abdominal pain, simple ovarian cyst (right), elevated CA-125, scant ascites. 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 I have discussed the uncertainties of this diagnosis with the patient and she is in agreement that she would like to proceed with diagnostic laparoscopy and right salpingo-oophorectomy. Surgery scheduled for 11/02/2014.  The risks of surgery were reviewed and all questions are answered.  HPI: 81 year old white widowed female seen in consultation at the request of Dr. Quincy Simmonds regarding a constellation of findings and symptoms. The patient was in her usual state of good health until she had some postmenopausal bleeding. Evaluation bleeding included a transvaginal ultrasound a Pap smear and endometrial biopsy. The endometrial stripe was 3.76 mm. Endometrial biopsy showed small fragments of atrophic endometrium. Ultrasound also showed a right ovarian cyst measuring 19 mm with a few internal echoes but no abnormal blood flow. CA-125 value was obtained and returned as 53 units per mL. Subsequently the patient had a CT scan of the abdomen and pelvis which appeared entirely normal except for a small amount of parahepatic fluid.  The patient denies any past gynecologic history. She has had 3 uncomplicated pregnancies.  She does note that for the last several months she has had ever increasing diffuse abdominal pain which worsens at the end of each day. She denies any nausea or vomiting or any other GI symptoms.  Review of Systems:10 point review of systems is negative except as noted in interval history.   Vitals: Blood pressure 142/89, pulse 72, temperature 97.4 F (36.3 C), temperature source Oral, resp. rate 18, weight 104 lb 11.2  oz (47.492 kg), last menstrual period 09/03/1996, SpO2 100 %.  Physical Exam: General : The patient is a healthy elderly woman in no acute distress.  HEENT: normocephalic, extraoccular movements normal; neck is supple without thyromegally  Lynphnodes: Supraclavicular and inguinal nodes not enlarged  Abdomen: Soft, yet, on palpation she is exquisitely sensitive with tenderness., no ascites, no organomegally, no masses, no hernias  Pelvic:  EGBUS: Normal female  Vagina: Normal, atrophic Urethra and Bladder: Normal, non-tender  Cervix: Small, atrophic Uterus: Small with some tenderness to palpation. Bi-manual examination: Non-tender; no adenxal masses or nodularity  Rectal: normal sphincter tone, no masses, no blood  Lower extremities: No edema or varicosities. Normal range of motion      Allergies  Allergen Reactions  . Ibuprofen Anaphylaxis    Passed out and had to be hospitalized  . Penicillins     Skin on head was crawling and very very sleepy  . Demerol [Meperidine Hcl] Nausea And Vomiting  . Dilaudid [Hydromorphone Hcl] Nausea And Vomiting  . Lyrica [Pregabalin]     "made me a zombie'  . Morphine And Related Nausea And Vomiting  . Percocet [Oxycodone-Acetaminophen] Nausea And Vomiting  . Vicodin [Hydrocodone-Acetaminophen] Nausea And Vomiting    Past Medical History  Diagnosis Date  . Depression   . History of cardiac catheterization 2002    Negative  . Heart disease   . Pain     chronic  . RLS (restless legs syndrome)   . Headache(784.0)   . Vertebral fracture   . Restless legs syndrome with nocturnal myoclonus 05/13/2013     [place] controlled   . Dense breast   .  Abnormal uterine bleeding 4010,2725  . Osteoporosis   . Hepatitis     age 4  . Stomach ulcer   . Hypothyroidism     Past Surgical History  Procedure Laterality Date  . Back surgery      x 3  . Breast surgery Bilateral     1 lump each breast removed and benign  . Nasal sinus surgery    .  Dilation and curettage of uterus  1970  . Cholecystectomy  1995    Lap  . Anemia      per pt, she states she doesnt remember that.maybe yrs ago  . Stomach ulcer  2010    and 2011  . Catheterization  2003    cardiac cath  . Gastric bypass    . Ulcers  2009    anemia, due to bleed  . Kyphoplasty  2010  . Cataract extraction Bilateral 2011    Current Outpatient Prescriptions  Medication Sig Dispense Refill  . acetaminophen (TYLENOL) 500 MG tablet Take 1,000 mg by mouth every 6 (six) hours as needed. Usually only takes 2 x a  day    . aspirin EC 81 MG tablet Take 162 mg by mouth at bedtime.     . calcium gluconate 500 MG tablet Take 500 mg by mouth 2 (two) times daily. With 1000 units D3    . cholecalciferol (VITAMIN D) 1000 UNITS tablet Take 1,000 Units by mouth daily. Take with calium    . Cranberry 200 MG CAPS Take by mouth 1 day or 1 dose.    Marland Kitchen denosumab (PROLIA) 60 MG/ML SOLN injection Inject 60 mg into the skin every 6 (six) months. Administer in upper arm, thigh, or abdomen    . Flaxseed, Linseed, (FLAX SEED OIL) 1000 MG CAPS Take by mouth 2 (two) times daily.    Marland Kitchen levothyroxine (SYNTHROID, LEVOTHROID) 50 MCG tablet Take 50 mcg by mouth daily.    . meclizine (ANTIVERT) 25 MG tablet Take 25 mg by mouth as needed.     . Multiple Vitamins-Minerals (CENTRUM SILVER PO) Take by mouth 1 day or 1 dose.    Marland Kitchen ipratropium (ATROVENT) 0.06 % nasal spray as needed.     Marland Kitchen rOPINIRole (REQUIP) 0.25 MG tablet TAKE 1 TABLET BY MOUTH 3 TIMES A DAY (Patient not taking: Reported on 10/15/2014) 270 tablet 2   No current facility-administered medications for this visit.    History   Social History  . Marital Status: Widowed    Spouse Name: N/A  . Number of Children: 3  . Years of Education: some coll.   Occupational History  . retired     Primary school teacher position   Social History Main Topics  . Smoking status: Never Smoker   . Smokeless tobacco: Never Used  . Alcohol Use: No  . Drug  Use: No  . Sexual Activity: No   Other Topics Concern  . Not on file   Social History Narrative   Patient is widowed with 3 children.   Patient is right handed.   Patient has some college education.   Patient drinks 1-2 cups daily.    Family History  Problem Relation Age of Onset  . Asthma Brother   . Osteoarthritis Son   . Hypertension Father   . Stroke Father   . Cancer Brother     stomach      Alvino Chapel, MD 10/15/2014, 11:24 AM

## 2014-11-03 ENCOUNTER — Telehealth: Payer: Self-pay | Admitting: Gynecologic Oncology

## 2014-11-03 ENCOUNTER — Encounter (HOSPITAL_COMMUNITY): Payer: Self-pay | Admitting: Gynecologic Oncology

## 2014-11-03 NOTE — Telephone Encounter (Signed)
Post op telephone call to check patient status.  Patient describes expected post operative status.  Adequate PO intake reported.  Bladder functioning without difficulty.  Advised to monitor bowels and take over the counter medication such as Miralax as needed.  Pain minimal.  Reportable signs and symptoms reviewed.  Advised to call for any questions or concerns.

## 2014-11-10 ENCOUNTER — Encounter (HOSPITAL_COMMUNITY): Payer: Self-pay | Admitting: Gynecologic Oncology

## 2014-11-18 DIAGNOSIS — M810 Age-related osteoporosis without current pathological fracture: Secondary | ICD-10-CM | POA: Diagnosis not present

## 2014-11-26 ENCOUNTER — Ambulatory Visit: Payer: Medicare Other | Attending: Gynecologic Oncology | Admitting: Gynecologic Oncology

## 2014-11-26 ENCOUNTER — Encounter: Payer: Self-pay | Admitting: Gynecologic Oncology

## 2014-11-26 VITALS — BP 122/78 | HR 84 | Temp 97.4°F | Resp 16 | Ht 63.23 in | Wt 99.7 lb

## 2014-11-26 DIAGNOSIS — Z79899 Other long term (current) drug therapy: Secondary | ICD-10-CM | POA: Insufficient documentation

## 2014-11-26 DIAGNOSIS — N838 Other noninflammatory disorders of ovary, fallopian tube and broad ligament: Secondary | ICD-10-CM

## 2014-11-26 DIAGNOSIS — D72829 Elevated white blood cell count, unspecified: Secondary | ICD-10-CM

## 2014-11-26 DIAGNOSIS — R1031 Right lower quadrant pain: Secondary | ICD-10-CM | POA: Diagnosis not present

## 2014-11-26 DIAGNOSIS — Z9889 Other specified postprocedural states: Secondary | ICD-10-CM | POA: Diagnosis not present

## 2014-11-26 DIAGNOSIS — D270 Benign neoplasm of right ovary: Secondary | ICD-10-CM | POA: Diagnosis not present

## 2014-11-26 DIAGNOSIS — R42 Dizziness and giddiness: Secondary | ICD-10-CM | POA: Diagnosis not present

## 2014-11-26 DIAGNOSIS — R1084 Generalized abdominal pain: Secondary | ICD-10-CM

## 2014-11-26 NOTE — Addendum Note (Signed)
Addended by: Christa See on: 11/26/2014 01:40 PM   Modules accepted: Medications

## 2014-11-26 NOTE — Patient Instructions (Signed)
Please call for any questions or concerns. 

## 2014-11-26 NOTE — Progress Notes (Signed)
POSTOPERATIVE EVALUATION  HPI:  Linda [name] is a 81 y.o. year old G2P2 initially seen in consultation on 10/15/14, referred by Dr Quincy Simmonds, for diffuse abdominal pain, elevated CA 125 and ascites. She had a simple right ovarian cyst.  She then underwent a laparoscopic bilateral salpingo-oophorectomy and exploration of the peritoneal cavity on 8/65/78 without complications.  Intraoperative findings were unremarkable. The patient had a normal peritoneal cavity, minimal increased peritoneal fluid, normal appearing omentum was no carcinomatosis, and normal ovaries bilaterally. She had no herniation of visceral structures through her right lower quadrant hernia. The appendix was normal as was the small and large intestine. The ovaries were removed and final pathology revealed a benign 2 cm right ovarian fibroma, and a normal left tube and ovary. Perineal washings were also sent and these were benign with no atypical or malignant cells found.   Of note preoperatively she had worsening of her right lower quadrant pain just prior to surgery. She also had an elevated white blood cell count preoperatively (19.2). A preoperative urinalysis and culture were negative. There were no intraperitoneal findings at the time of surgery to explain her elevated white blood cell count.   Her postoperative course was uncomplicated.  Her final pathology revealed right ovarian fibroma (benign) and normal washings.  She is seen today for a postoperative check and to discuss her pathology results and ongoing plan.  Since discharge from the hospital, she is feeling the same. She continues to have the same abdominal and right lower quadrant pain and groin pain that she had preoperatively. She also reports significant persistent dizziness which also preceded surgery. She is treated with meclizine for this. However the meclizine does not improve his symptoms. She feels foggy in the head. She denies localizing neurologic symptoms. She has  no more vaginal bleeding (uterine manipulator had been placed during surgery). She has no other complaints today.    Review of systems: Constitutional:  She has no weight gain or weight loss. She has no fever or chills. Eyes: No blurred vision Ears, Nose, Mouth, Throat: +dizziness, No headaches or changes in hearing. No mouth sores. Cardiovascular: No chest pain, palpitations or edema. Respiratory:  No shortness of breath, wheezing or cough Gastrointestinal: She has normal bowel movements without diarrhea or constipation. She denies any nausea or vomiting. She denies blood in her stool or heart burn. Genitourinary:  + pelvic pain, No pelvic pressure or changes in her urinary function. She has no hematuria, dysuria, or incontinence. She has no irregular vaginal bleeding or vaginal discharge Musculoskeletal: Denies muscle weakness or joint pains.  Skin:  She has no skin changes, rashes or itching Neurological:  + dizziness, noheadaches. No neuropathy, no numbness or tingling. Psychiatric:  She denies depression or anxiety. Hematologic/Lymphatic:   No easy bruising or bleeding   Physical Exam: Blood pressure 122/78, pulse 84, temperature 97.4 F (36.3 C), temperature source Oral, resp. rate 16, height 5' 3.23" (1.606 m), weight 99 lb 11.2 oz (45.224 kg), last menstrual period 09/03/1996. General: Well dressed, well nourished in no apparent distress.   HEENT:  Normocephalic and atraumatic, no lesions.  Extraocular muscles intact. Sclerae anicteric. Pupils equal, round, reactive. No mouth sores or ulcers. Thyroid is normal size, not nodular, midline. Skin:  No lesions or rashes. Breasts:  Deferred Lungs:  deferred. Cardiovascular: deferred Abdomen:  Soft, nontender, nondistended.  No palpable masses.  No hepatosplenomegaly.  No ascites. Normal bowel sounds.  No hernias.  Incisions are healing normally Genitourinary: deferred Extremities: No cyanosis,  clubbing or edema.  No calf tenderness or  erythema. No palpable cords. Psychiatric: Mood and affect are appropriate. Neurological: Awake, alert and oriented x 3. Sensation is intact, no neuropathy.  Musculoskeletal: No pain, normal strength and range of motion.  Assessment:    81 y.o. year old with a 2 cm right ovarian fibroma which was removed on 11/02/2014 as part of the bilateral something good nephrectomy. She is healed well from surgery. However surgical findings did not explain her preoperative symptoms of abdominal pain, elevated white blood cell count, groin pain in the right, dizziness. I am recommending that she see her primary care doctor Dr. Loney Loh further work up her symptoms as she does not have a gynecologic or intraperitoneal source for these symptoms.    Plan: 1) Pathology reports reviewed today 2) Treatment counseling - no further intervention required for the ovarian fibroma Needs further workup of her abdominal pain, elevated WBC, right groin pain and dizziness. 3)  Return to clinic prn basis only  [name], MD

## 2014-12-07 DIAGNOSIS — R42 Dizziness and giddiness: Secondary | ICD-10-CM | POA: Diagnosis not present

## 2014-12-07 DIAGNOSIS — I4891 Unspecified atrial fibrillation: Secondary | ICD-10-CM | POA: Diagnosis not present

## 2014-12-16 DIAGNOSIS — R1110 Vomiting, unspecified: Secondary | ICD-10-CM | POA: Diagnosis not present

## 2014-12-16 DIAGNOSIS — R42 Dizziness and giddiness: Secondary | ICD-10-CM | POA: Diagnosis not present

## 2014-12-20 DIAGNOSIS — I480 Paroxysmal atrial fibrillation: Secondary | ICD-10-CM | POA: Diagnosis not present

## 2014-12-22 DIAGNOSIS — I480 Paroxysmal atrial fibrillation: Secondary | ICD-10-CM | POA: Diagnosis not present

## 2014-12-22 DIAGNOSIS — E039 Hypothyroidism, unspecified: Secondary | ICD-10-CM | POA: Diagnosis not present

## 2014-12-24 DIAGNOSIS — R42 Dizziness and giddiness: Secondary | ICD-10-CM | POA: Diagnosis not present

## 2014-12-24 DIAGNOSIS — R110 Nausea: Secondary | ICD-10-CM | POA: Diagnosis not present

## 2014-12-25 ENCOUNTER — Inpatient Hospital Stay (HOSPITAL_COMMUNITY): Payer: Medicare Other

## 2014-12-25 ENCOUNTER — Emergency Department (HOSPITAL_COMMUNITY): Payer: Medicare Other

## 2014-12-25 ENCOUNTER — Encounter: Payer: Self-pay | Admitting: Neurology

## 2014-12-25 ENCOUNTER — Inpatient Hospital Stay (HOSPITAL_COMMUNITY)
Admission: EM | Admit: 2014-12-25 | Discharge: 2014-12-28 | DRG: 064 | Disposition: A | Discharge status: Skilled Nursing Facility | Payer: Medicare Other | Attending: Internal Medicine | Admitting: Internal Medicine

## 2014-12-25 ENCOUNTER — Encounter (HOSPITAL_COMMUNITY): Payer: Self-pay

## 2014-12-25 DIAGNOSIS — G2581 Restless legs syndrome: Secondary | ICD-10-CM | POA: Diagnosis present

## 2014-12-25 DIAGNOSIS — K219 Gastro-esophageal reflux disease without esophagitis: Secondary | ICD-10-CM | POA: Diagnosis present

## 2014-12-25 DIAGNOSIS — R1313 Dysphagia, pharyngeal phase: Secondary | ICD-10-CM | POA: Diagnosis not present

## 2014-12-25 DIAGNOSIS — I6340 Cerebral infarction due to embolism of unspecified cerebral artery: Secondary | ICD-10-CM

## 2014-12-25 DIAGNOSIS — I63512 Cerebral infarction due to unspecified occlusion or stenosis of left middle cerebral artery: Secondary | ICD-10-CM

## 2014-12-25 DIAGNOSIS — R1314 Dysphagia, pharyngoesophageal phase: Secondary | ICD-10-CM | POA: Diagnosis not present

## 2014-12-25 DIAGNOSIS — K279 Peptic ulcer, site unspecified, unspecified as acute or chronic, without hemorrhage or perforation: Secondary | ICD-10-CM | POA: Diagnosis present

## 2014-12-25 DIAGNOSIS — E43 Unspecified severe protein-calorie malnutrition: Secondary | ICD-10-CM | POA: Diagnosis present

## 2014-12-25 DIAGNOSIS — E785 Hyperlipidemia, unspecified: Secondary | ICD-10-CM | POA: Diagnosis present

## 2014-12-25 DIAGNOSIS — M810 Age-related osteoporosis without current pathological fracture: Secondary | ICD-10-CM | POA: Diagnosis present

## 2014-12-25 DIAGNOSIS — R2681 Unsteadiness on feet: Secondary | ICD-10-CM | POA: Diagnosis not present

## 2014-12-25 DIAGNOSIS — J90 Pleural effusion, not elsewhere classified: Secondary | ICD-10-CM | POA: Diagnosis not present

## 2014-12-25 DIAGNOSIS — M6281 Muscle weakness (generalized): Secondary | ICD-10-CM | POA: Diagnosis not present

## 2014-12-25 DIAGNOSIS — I63412 Cerebral infarction due to embolism of left middle cerebral artery: Secondary | ICD-10-CM | POA: Diagnosis not present

## 2014-12-25 DIAGNOSIS — I639 Cerebral infarction, unspecified: Secondary | ICD-10-CM

## 2014-12-25 DIAGNOSIS — R29898 Other symptoms and signs involving the musculoskeletal system: Secondary | ICD-10-CM | POA: Diagnosis not present

## 2014-12-25 DIAGNOSIS — I480 Paroxysmal atrial fibrillation: Secondary | ICD-10-CM | POA: Diagnosis present

## 2014-12-25 DIAGNOSIS — I4891 Unspecified atrial fibrillation: Secondary | ICD-10-CM | POA: Diagnosis not present

## 2014-12-25 DIAGNOSIS — G8929 Other chronic pain: Secondary | ICD-10-CM | POA: Diagnosis present

## 2014-12-25 DIAGNOSIS — Z7982 Long term (current) use of aspirin: Secondary | ICD-10-CM | POA: Diagnosis not present

## 2014-12-25 DIAGNOSIS — I10 Essential (primary) hypertension: Secondary | ICD-10-CM | POA: Diagnosis not present

## 2014-12-25 DIAGNOSIS — E039 Hypothyroidism, unspecified: Secondary | ICD-10-CM | POA: Diagnosis not present

## 2014-12-25 DIAGNOSIS — G8191 Hemiplegia, unspecified affecting right dominant side: Secondary | ICD-10-CM | POA: Diagnosis present

## 2014-12-25 DIAGNOSIS — M1990 Unspecified osteoarthritis, unspecified site: Secondary | ICD-10-CM | POA: Diagnosis present

## 2014-12-25 DIAGNOSIS — R4701 Aphasia: Secondary | ICD-10-CM | POA: Diagnosis present

## 2014-12-25 DIAGNOSIS — I213 ST elevation (STEMI) myocardial infarction of unspecified site: Secondary | ICD-10-CM | POA: Diagnosis not present

## 2014-12-25 DIAGNOSIS — I69320 Aphasia following cerebral infarction: Secondary | ICD-10-CM | POA: Diagnosis not present

## 2014-12-25 DIAGNOSIS — J9811 Atelectasis: Secondary | ICD-10-CM | POA: Diagnosis not present

## 2014-12-25 DIAGNOSIS — R278 Other lack of coordination: Secondary | ICD-10-CM | POA: Diagnosis not present

## 2014-12-25 DIAGNOSIS — I638 Other cerebral infarction: Secondary | ICD-10-CM | POA: Diagnosis not present

## 2014-12-25 DIAGNOSIS — R29810 Facial weakness: Secondary | ICD-10-CM | POA: Diagnosis not present

## 2014-12-25 DIAGNOSIS — I6789 Other cerebrovascular disease: Secondary | ICD-10-CM | POA: Diagnosis not present

## 2014-12-25 DIAGNOSIS — J811 Chronic pulmonary edema: Secondary | ICD-10-CM | POA: Diagnosis not present

## 2014-12-25 LAB — COMPREHENSIVE METABOLIC PANEL
ALT: 27 U/L (ref 0–35)
AST: 31 U/L (ref 0–37)
Albumin: 3.4 g/dL — ABNORMAL LOW (ref 3.5–5.2)
Alkaline Phosphatase: 111 U/L (ref 39–117)
Anion gap: 7 (ref 5–15)
BUN: 16 mg/dL (ref 6–23)
CO2: 26 mmol/L (ref 19–32)
CREATININE: 1.2 mg/dL — AB (ref 0.50–1.10)
Calcium: 9.5 mg/dL (ref 8.4–10.5)
Chloride: 107 mmol/L (ref 96–112)
GFR calc Af Amer: 47 mL/min — ABNORMAL LOW (ref >90)
GFR calc non Af Amer: 40 mL/min — ABNORMAL LOW (ref >90)
GLUCOSE: 111 mg/dL — AB (ref 70–99)
Potassium: 4.3 mmol/L (ref 3.5–5.1)
Sodium: 140 mmol/L (ref 135–145)
TOTAL PROTEIN: 6.9 g/dL (ref 6.0–8.3)
Total Bilirubin: 0.6 mg/dL (ref 0.3–1.2)

## 2014-12-25 LAB — RAPID URINE DRUG SCREEN, HOSP PERFORMED
AMPHETAMINES: NOT DETECTED
BARBITURATES: NOT DETECTED
BENZODIAZEPINES: NOT DETECTED
COCAINE: NOT DETECTED
Opiates: NOT DETECTED
TETRAHYDROCANNABINOL: NOT DETECTED

## 2014-12-25 LAB — I-STAT CHEM 8, ED
BUN: 18 mg/dL (ref 6–23)
CREATININE: 1.1 mg/dL (ref 0.50–1.10)
Calcium, Ion: 1.25 mmol/L (ref 1.13–1.30)
Chloride: 105 mmol/L (ref 96–112)
GLUCOSE: 104 mg/dL — AB (ref 70–99)
HCT: 43 % (ref 36.0–46.0)
HEMOGLOBIN: 14.6 g/dL (ref 12.0–15.0)
Potassium: 4.3 mmol/L (ref 3.5–5.1)
SODIUM: 142 mmol/L (ref 135–145)
TCO2: 23 mmol/L (ref 0–100)

## 2014-12-25 LAB — ETHANOL: Alcohol, Ethyl (B): <5 mg/dL (ref 0–9)

## 2014-12-25 LAB — DIFFERENTIAL
BASOS PCT: 1 % (ref 0–1)
Basophils Absolute: 0.1 10*3/uL (ref 0.0–0.1)
Eosinophils Absolute: 0.3 10*3/uL (ref 0.0–0.7)
Eosinophils Relative: 3 % (ref 0–5)
Lymphocytes Relative: 19 % (ref 12–46)
Lymphs Abs: 1.6 10*3/uL (ref 0.7–4.0)
Monocytes Absolute: 0.6 10*3/uL (ref 0.1–1.0)
Monocytes Relative: 8 % (ref 3–12)
NEUTROS PCT: 69 % (ref 43–77)
Neutro Abs: 5.6 10*3/uL (ref 1.7–7.7)

## 2014-12-25 LAB — CBC
HCT: 40.3 % (ref 36.0–46.0)
Hemoglobin: 13.1 g/dL (ref 12.0–15.0)
MCH: 31.3 pg (ref 26.0–34.0)
MCHC: 32.5 g/dL (ref 30.0–36.0)
MCV: 96.4 fL (ref 78.0–100.0)
Platelets: 323 10*3/uL (ref 150–400)
RBC: 4.18 MIL/uL (ref 3.87–5.11)
RDW: 12.8 % (ref 11.5–15.5)
WBC: 8.1 10*3/uL (ref 4.0–10.5)

## 2014-12-25 LAB — I-STAT TROPONIN, ED: Troponin i, poc: 0.02 ng/mL (ref 0.00–0.08)

## 2014-12-25 LAB — URINALYSIS, ROUTINE W REFLEX MICROSCOPIC
BILIRUBIN URINE: NEGATIVE
Glucose, UA: NEGATIVE mg/dL
Hgb urine dipstick: NEGATIVE
KETONES UR: NEGATIVE mg/dL
LEUKOCYTES UA: NEGATIVE
NITRITE: NEGATIVE
Protein, ur: NEGATIVE mg/dL
SPECIFIC GRAVITY, URINE: 1.013 (ref 1.005–1.030)
UROBILINOGEN UA: 0.2 mg/dL (ref 0.0–1.0)
pH: 6.5 (ref 5.0–8.0)

## 2014-12-25 LAB — PROTIME-INR
INR: 1.13 (ref 0.00–1.49)
Prothrombin Time: 14.7 seconds (ref 11.6–15.2)

## 2014-12-25 LAB — APTT: aPTT: 31 seconds (ref 24–37)

## 2014-12-25 MED ORDER — ACETAMINOPHEN 325 MG PO TABS
650.0000 mg | ORAL_TABLET | ORAL | Status: DC | PRN
  Administered 2014-12-27: 650 mg via ORAL
  Filled 2014-12-25: qty 2

## 2014-12-25 MED ORDER — SENNOSIDES-DOCUSATE SODIUM 8.6-50 MG PO TABS: 1.0000 | ORAL_TABLET | Freq: Every evening | ORAL | Status: DC | PRN

## 2014-12-25 MED ORDER — PROPYLENE GLYCOL 0.6 % OP SOLN: Freq: Two times a day (BID) | OPHTHALMIC | Status: DC

## 2014-12-25 MED ORDER — HEPARIN SODIUM (PORCINE) 5000 UNIT/ML IJ SOLN
5000.0000 [IU] | Freq: Three times a day (TID) | INTRAMUSCULAR | Status: DC
  Administered 2014-12-25 – 2014-12-28 (×9): 5000 [IU] via SUBCUTANEOUS
  Filled 2014-12-25 (×8): qty 1

## 2014-12-25 MED ORDER — SODIUM CHLORIDE 0.9 % IV SOLN
INTRAVENOUS | Status: AC
  Administered 2014-12-25 – 2014-12-26 (×2): via INTRAVENOUS

## 2014-12-25 MED ORDER — IPRATROPIUM BROMIDE 0.06 % NA SOLN
2.0000 | Freq: Two times a day (BID) | NASAL | Status: DC | PRN
  Filled 2014-12-25: qty 15

## 2014-12-25 MED ORDER — ASPIRIN 325 MG PO TABS
325.0000 mg | ORAL_TABLET | Freq: Every day | ORAL | Status: DC
  Administered 2014-12-27 – 2014-12-28 (×2): 325 mg via ORAL
  Filled 2014-12-25 (×2): qty 1

## 2014-12-25 MED ORDER — LORAZEPAM 2 MG/ML IJ SOLN
0.5000 mg | Freq: Once | INTRAMUSCULAR | Status: AC
  Administered 2014-12-25: 0.5 mg via INTRAVENOUS
  Filled 2014-12-25: qty 1

## 2014-12-25 MED ORDER — LEVOTHYROXINE SODIUM 100 MCG IV SOLR
25.0000 ug | Freq: Every day | INTRAVENOUS | Status: DC
  Administered 2014-12-25 – 2014-12-27 (×3): 25 ug via INTRAVENOUS
  Filled 2014-12-25 (×5): qty 5

## 2014-12-25 MED ORDER — ASPIRIN 300 MG RE SUPP
300.0000 mg | Freq: Every day | RECTAL | Status: DC
  Administered 2014-12-25 – 2014-12-26 (×2): 300 mg via RECTAL
  Filled 2014-12-25 (×2): qty 1

## 2014-12-25 MED ORDER — CETYLPYRIDINIUM CHLORIDE 0.05 % MT LIQD
7.0000 mL | Freq: Two times a day (BID) | OROMUCOSAL | Status: DC
  Administered 2014-12-25 – 2014-12-28 (×7): 7 mL via OROMUCOSAL

## 2014-12-25 MED ORDER — PANTOPRAZOLE SODIUM 40 MG IV SOLR
40.0000 mg | INTRAVENOUS | Status: DC
  Administered 2014-12-25 – 2014-12-27 (×3): 40 mg via INTRAVENOUS
  Filled 2014-12-25 (×3): qty 40

## 2014-12-25 MED ORDER — STROKE: EARLY STAGES OF RECOVERY BOOK
Freq: Once | Status: DC
  Filled 2014-12-25 (×3): qty 1

## 2014-12-25 MED ORDER — ACETAMINOPHEN 650 MG RE SUPP
650.0000 mg | RECTAL | Status: DC | PRN
  Administered 2014-12-25: 650 mg via RECTAL
  Filled 2014-12-25: qty 1

## 2014-12-25 NOTE — Consult Note (Addendum)
Stroke Consult    Chief Complaint: speech difficulty and right sided weakness  HPI: Linda [name] is an 81 y.o. female unremarkable past medical history presenting after waking up with right sided weakness and difficulty speaking. Patient went to bed at 2100 in normal state of health. Upon waking noted to be weak on right sided and unable to communicate. No prior CVA or TIA history. No known history of A fib.   Of note her sons state that she was told by a doctor that she may have an "irregular heartbeat". Further testing was pending on this.   CT head imaging reviewed shows likely L MCA territory infarct.   Date last known well: 12/24/2014 Time last known well: 2100 tPA Given: no, outside tPA window  Past Medical History  Diagnosis Date  . History of cardiac catheterization 2002    Negative  . Vertebral fracture   . Restless legs syndrome with nocturnal myoclonus 05/13/2013     requip controlled   . Abnormal uterine bleeding 8676,7209  . Hypothyroidism   . Arthritis   . Osteoporosis   . History of stomach ulcers   . History of jaundice as a child   . Anemia   . Head pain, chronic     "I have pain in the back of my head for months"  . Dizziness     TAKES MECLIZINE TO TREAT    Past Surgical History  Procedure Laterality Date  . Back surgery      x 3  . Breast surgery Bilateral     1 lump each breast removed and benign  . Nasal sinus surgery    . Dilation and curettage of uterus  1970  . Catheterization  2003    cardiac cath  . Kyphoplasty  2010  . Cataract extraction Bilateral 2011  . Cholecystectomy  1995    Lap  . Cardiac catheterization  2002    "it was normal" per pt - no record found  . Robotic assisted salpingo oopherectomy Bilateral 11/02/2014    Procedure: ROBOTIC ASSISTED bilateral SALPINGO OOPHORECTOMY;  Surgeon: Everitt Amber, MD;  Location: [facility];  Service: Gynecology;  Laterality: Bilateral;  . Laparoscopy N/A 11/02/2014    Procedure: LAPAROSCOPY  DIAGNOSTIC;  Surgeon: Everitt Amber, MD;  Location: [facility];  Service: Gynecology;  Laterality: N/A;    Family History  Problem Relation Age of Onset  . Asthma Brother   . Osteoarthritis Son   . Hypertension Father   . Stroke Father   . Cancer Brother     stomach   Social History:  reports that she has never smoked. She has never used smokeless tobacco. She reports that she does not drink alcohol or use illicit drugs.  Allergies:  Allergies  Allergen Reactions  . Ibuprofen Anaphylaxis    Passed out and had to be hospitalized  . Penicillins     Skin on head was crawling and very very sleepy  . Demerol [Meperidine Hcl] Nausea And Vomiting  . Dilaudid [Hydromorphone Hcl] Nausea And Vomiting  . Lyrica [Pregabalin]     "made me a zombie'  . Morphine And Related Nausea And Vomiting  . Percocet [Oxycodone-Acetaminophen] Nausea And Vomiting  . Vicodin [Hydrocodone-Acetaminophen] Nausea And Vomiting     (Not in a hospital admission)  ROS: Out of a complete 14 system review, the patient complains of only the following symptoms, and all other reviewed systems are negative.    Physical Examination: Filed Vitals:   12/25/14 0745  BP: 169/86  Pulse: 67  Temp:   Resp: 22   Physical Exam  Constitutional: He appears well-developed and well-nourished.  Psych: Affect appropriate to situation Eyes: No scleral injection HENT: No OP obstrucion Head: Normocephalic.  Cardiovascular: Normal rate and regular rhythm.  Respiratory: Effort normal and breath sounds normal.  GI: Soft. Bowel sounds are normal. No distension. There is no tenderness.  Skin: WDI   Neurologic Examination: Mental Status: Alert, makes eye contact, non-verbal, minimally following commands. Marked expressive > receptive aphasia Cranial Nerves: II: funduscopic exam wnl bilaterally, visual fields grossly normal, pupils equal, round, reactive to light  III,IV, VI: ptosis not present, extra-ocular motions intact  bilaterally V,VII: R NLF flattening and right facial weakness, facial light touch sensation normal bilaterally VIII: hearing normal bilaterally IX,X: gag reflex present XI: trapezius strength/neck flexion strength normal bilaterally XII: tongue strength normal  Motor: No spontaneous movement of RUE or bilateral LE Lifts LUE against gravity and light resistance Minimally lifts RUE antigravity No movement noted bilateral LE Tone and bulk:normal tone throughout; no atrophy noted Sensory: withdrawals to noxious stimuli on L side, no withdrawal R side Deep Tendon Reflexes: 2+ and symmetric throughout Plantars: Right: mute    Left: downgoing Cerebellar: Unable to test due to comprehension Gait: unable to test  Laboratory Studies:   Basic Metabolic Panel:  Recent Labs Lab 12/25/14 0801  NA 142  K 4.3  CL 105  GLUCOSE 104*  BUN 18  CREATININE 1.10    Liver Function Tests: No results for input(s): AST, ALT, ALKPHOS, BILITOT, PROT, ALBUMIN in the last 168 hours. No results for input(s): LIPASE, AMYLASE in the last 168 hours. No results for input(s): AMMONIA in the last 168 hours.  CBC:  Recent Labs Lab 12/25/14 0747 12/25/14 0801  WBC 8.1  --   NEUTROABS 5.6  --   HGB 13.1 14.6  HCT 40.3 43.0  MCV 96.4  --   PLT 323  --     Cardiac Enzymes: No results for input(s): CKTOTAL, CKMB, CKMBINDEX, TROPONINI in the last 168 hours.  BNP: Invalid input(s): POCBNP  CBG: No results for input(s): GLUCAP in the last 168 hours.  Microbiology: Results for orders placed or performed in visit on 10/29/14  Urine culture     Status: None   Collection Time: 10/29/14  2:35 PM  Result Value Ref Range Status   Urine Culture, Routine Culture, Urine  Final    Comment: Final - ===== COLONY COUNT: ===== NO GROWTH NO GROWTH     Coagulation Studies: No results for input(s): LABPROT, INR in the last 72 hours.  Urinalysis: No results for input(s): COLORURINE, LABSPEC, PHURINE,  GLUCOSEU, HGBUR, BILIRUBINUR, KETONESUR, PROTEINUR, UROBILINOGEN, NITRITE, LEUKOCYTESUR in the last 168 hours.  Invalid input(s): APPERANCEUR  Lipid Panel:  No results found for: CHOL, TRIG, HDL, CHOLHDL, VLDL, LDLCALC  HgbA1C: No results found for: HGBA1C  Urine Drug Screen:  No results found for: LABOPIA, COCAINSCRNUR, LABBENZ, AMPHETMU, THCU, LABBARB  Alcohol Level: No results for input(s): ETH in the last 168 hours.  Other results: EKG: normal sinus rhythm.  Imaging: Ct Head Wo Contrast  12/25/2014   CLINICAL DATA:  [facility]- pt coming from home with stroke like symptoms. LSN at 9pm last night. Pt with right side weakness, right side facial droop, pt is non-verbal which is abnormal for her. Pt able to answer questions with head gestures. H/o anemia and chronic headaches.  EXAM: CT HEAD WITHOUT CONTRAST  TECHNIQUE: Contiguous axial images were obtained  from the base of the skull through the vertex without intravenous contrast.  COMPARISON:  11/15/2011  FINDINGS: There is loss of the gray-white junction noted along the posterior left frontal lobe extending to the left temporal lobe. This is consistent with recent infarction.  There is no other evidence of a recent cortical infarct.  There are no parenchymal masses or mass effect. Mild periventricular white matter hypoattenuation is noted consistent with stable chronic microvascular ischemic change.  There are no extra-axial masses or abnormal fluid collections.  Ventricles are normal configuration. There is ventricular enlargement consistent with atrophy, similar to the prior exam. No convincing hydrocephalus.  There is no intracranial hemorrhage.  Sinuses and mastoid air cells are clear.  No skull lesion.  IMPRESSION: 1. Recent, likely early subacute, left middle cerebral artery territory infarct involving the posterior left frontal lobe and left temporal lobe. 2. No other acute or recent abnormality. No other evidence of an infarct. 3. No  intracranial hemorrhage.   Electronically Signed   By: Lajean Manes M.D.   On: 12/25/2014 08:09    Assessment: 81 y.o. female with unremarkable past medical history presenting after waking up with right sided weakness and aphasia. CT head shows likely L MCA distribution infarct. Concern for embolic etiology. Will be admitted for stroke workup. With history of irregular heartbeat may need to consider loop recorder.   Plan: 1. HgbA1c, fasting lipid panel 2. MRI, MRA  of the brain without contrast 3. PT consult, OT consult, Speech consult 4. Echocardiogram 5. Carotid dopplers 6. Prophylactic therapy-ASA 325mg  daily 7. Risk factor modification 8. Telemetry monitoring 9. Frequent neuro checks 10. NPO until RN stroke swallow screen    Jim Like, DO [facility] [phone]  If 7pm- 7am, please page neurology on call as listed in [facility]. 12/25/2014, 8:34 AM

## 2014-12-25 NOTE — Evaluation (Signed)
Clinical/Bedside Swallow Evaluation Patient Details  Name: Linda [name] MRN: [id number] Date of Birth: 01-26-1929  Today's Date: 12/25/2014 Time: SLP Start Time (ACUTE ONLY): 1151 SLP Stop Time (ACUTE ONLY): 1207 SLP Time Calculation (min) (ACUTE ONLY): 16 min  Past Medical History:  Past Medical History  Diagnosis Date  . History of cardiac catheterization 2002    Negative  . Vertebral fracture   . Restless legs syndrome with nocturnal myoclonus 05/13/2013     requip controlled   . Abnormal uterine bleeding 4431,5400  . Hypothyroidism   . Arthritis   . Osteoporosis   . History of stomach ulcers   . History of jaundice as a child   . Anemia   . Head pain, chronic     "I have pain in the back of my head for months"  . Dizziness     TAKES MECLIZINE TO TREAT   Past Surgical History:  Past Surgical History  Procedure Laterality Date  . Back surgery      x 3  . Breast surgery Bilateral     1 lump each breast removed and benign  . Nasal sinus surgery    . Dilation and curettage of uterus  1970  . Catheterization  2003    cardiac cath  . Kyphoplasty  2010  . Cataract extraction Bilateral 2011  . Cholecystectomy  1995    Lap  . Cardiac catheterization  2002    "it was normal" per pt - no record found  . Robotic assisted salpingo oopherectomy Bilateral 11/02/2014    Procedure: ROBOTIC ASSISTED bilateral SALPINGO OOPHORECTOMY;  Surgeon: Everitt Amber, MD;  Location: [facility];  Service: Gynecology;  Laterality: Bilateral;  . Laparoscopy N/A 11/02/2014    Procedure: LAPAROSCOPY DIAGNOSTIC;  Surgeon: Everitt Amber, MD;  Location: [facility];  Service: Gynecology;  Laterality: N/A;   HPI:  Linda [name] is an 81 y.o. female who presents with difficulty communicating and right-sided weakness. CT shows early subacute left MCA territory infarct; MRI pending. 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   Assessment / Plan /  Recommendation Clinical Impression  Pt has decreased awareness of self-feeding task with suspected premature spillage leading to immediate coughing with thin liquids. No overt coughing is noted with pureed solids, however pt has up to 4 swallows per 1/2 tsp of applesauce, concerning for pharyngeal residuals. Unable to assess vocal quality throughout session due to aphasia. Given current swallow function and cognitive-linguistic status, recommend to remain NPO at this time. Will f/u on next date for additional diagnostic PO trials and full speech-language evaluation.    Aspiration Risk  Severe    Diet Recommendation NPO   Medication Administration: Via alternative means    Other  Recommendations Oral Care Recommendations: Oral care Q4 per protocol   Follow Up Recommendations  Skilled Nursing facility    Frequency and Duration min 3x week  1 week   Pertinent Vitals/Pain n/a    SLP Swallow Goals     Swallow Study Prior Functional Status       General Date of Onset: 12/25/14 HPI: Linda [name] is an 81 y.o. female who presents with difficulty communicating and right-sided weakness. CT shows early subacute left MCA territory infarct; MRI pending. 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 Type of Study: Bedside swallow evaluation Previous Swallow Assessment: none in chart Diet Prior to this Study: NPO Temperature Spikes Noted: No Respiratory Status: Room air History of Recent  Intubation: No Behavior/Cognition: Alert;Requires cueing Oral Cavity - Dentition: Adequate natural dentition (growth on hard palate) Self-Feeding Abilities: Needs assist Patient Positioning: Upright in bed Baseline Vocal Quality: Other (comment) (UTA)    Oral/Motor/Sensory Function Overall Oral Motor/Sensory Function: Impaired Labial ROM: Reduced right Labial Symmetry: Abnormal symmetry right Labial Strength: Reduced Facial ROM:  Reduced right Facial Symmetry: Right droop Facial Strength: Reduced   Ice Chips Ice chips: Impaired Presentation: Spoon Oral Phase Impairments: Poor awareness of bolus Pharyngeal Phase Impairments: Suspected delayed Swallow   Thin Liquid Thin Liquid: Impaired Presentation: Cup;Self Fed Oral Phase Impairments: Poor awareness of bolus Pharyngeal  Phase Impairments: Suspected delayed Swallow;Cough - Immediate    Nectar Thick Nectar Thick Liquid: Not tested   Honey Thick Honey Thick Liquid: Not tested   Puree Puree: Impaired Presentation: Self Fed;Spoon Oral Phase Impairments: Poor awareness of bolus Pharyngeal Phase Impairments: Suspected delayed Swallow;Multiple swallows   Solid   Solid: Not tested      Germain Osgood, M.A. CCC-SLP [phone]  Germain Osgood 12/25/2014,2:03 PM

## 2014-12-25 NOTE — ED Notes (Signed)
[facility]- pt coming from home with stroke like symptoms. LSN at 9pm last night. Pt with right side weakness, right side facial droop, pt is non-verbal which is abnormal for her. Pt able to answer questions with head gestures.

## 2014-12-25 NOTE — ED Notes (Signed)
Hospitalist at bedside 

## 2014-12-25 NOTE — ED Provider Notes (Signed)
CSN: [id number]     Arrival date & time 12/25/14  0174 History   First MD Initiated Contact with Patient 12/25/14 [phone]     Chief Complaint  Patient presents with  . Weakness     (Consider location/radiation/quality/duration/timing/severity/associated sxs/prior Treatment) HPI Comments: Patient presents to the emergency department for evaluation of difficulty with speech and right-sided weakness. Last seen normal at 9 PM last night when she went to bed. Family reports that she awakened at 7 AM and could not speak. They have noticed that the right side is weak. At arrival to the ER, patient answers yes and no questions intermittently, but does not speak at all. Level V Caveat due to noncommunicative state.  Patient is a 81 y.o. female presenting with weakness.  Weakness    Past Medical History  Diagnosis Date  . History of cardiac catheterization 2002    Negative  . Vertebral fracture   . Restless legs syndrome with nocturnal myoclonus 05/13/2013     requip controlled   . Abnormal uterine bleeding 6759,1638  . Hypothyroidism   . Arthritis   . Osteoporosis   . History of stomach ulcers   . History of jaundice as a child   . Anemia   . Head pain, chronic     "I have pain in the back of my head for months"  . Dizziness     TAKES MECLIZINE TO TREAT   Past Surgical History  Procedure Laterality Date  . Back surgery      x 3  . Breast surgery Bilateral     1 lump each breast removed and benign  . Nasal sinus surgery    . Dilation and curettage of uterus  1970  . Catheterization  2003    cardiac cath  . Kyphoplasty  2010  . Cataract extraction Bilateral 2011  . Cholecystectomy  1995    Lap  . Cardiac catheterization  2002    "it was normal" per pt - no record found  . Robotic assisted salpingo oopherectomy Bilateral 11/02/2014    Procedure: ROBOTIC ASSISTED bilateral SALPINGO OOPHORECTOMY;  Surgeon: Everitt Amber, MD;  Location: [facility];  Service: Gynecology;  Laterality:  Bilateral;  . Laparoscopy N/A 11/02/2014    Procedure: LAPAROSCOPY DIAGNOSTIC;  Surgeon: Everitt Amber, MD;  Location: [facility];  Service: Gynecology;  Laterality: N/A;   Family History  Problem Relation Age of Onset  . Asthma Brother   . Osteoarthritis Son   . Hypertension Father   . Stroke Father   . Cancer Brother     stomach   History  Substance Use Topics  . Smoking status: Never Smoker   . Smokeless tobacco: Never Used  . Alcohol Use: No   OB History    Gravida Para Term Preterm AB TAB SAB Ectopic Multiple Living   2 2       1 3      Review of Systems  Unable to perform ROS: Patient nonverbal  Neurological: Positive for speech difficulty and weakness (right side).      Allergies  Ibuprofen; Penicillins; Demerol; Dilaudid; Lyrica; Morphine and related; Percocet; and Vicodin  Home Medications   Prior to Admission medications   Medication Sig Start Date End Date Taking? Authorizing Provider  acetaminophen (TYLENOL) 500 MG tablet Take 1,000 mg by mouth every 6 (six) hours as needed. Usually only takes 2 x a  day    Historical Provider, MD  aspirin EC 81 MG tablet Take 162 mg by mouth at  bedtime.     Historical Provider, MD  calcium gluconate 500 MG tablet Take 500 mg by mouth 2 (two) times daily. With 1000 units D3    Historical Provider, MD  cholecalciferol (VITAMIN D) 1000 UNITS tablet Take 1,000 Units by mouth 2 (two) times daily. Take with calium    Historical Provider, MD  Cranberry 200 MG CAPS Take 1 capsule by mouth daily with lunch.     Historical Provider, MD  denosumab (PROLIA) 60 MG/ML SOLN injection Inject 60 mg into the skin every 6 (six) months. Administer in upper arm, thigh, or abdomen    Historical Provider, MD  Flaxseed, Linseed, (FLAX SEED OIL) 1000 MG CAPS Take 1 capsule by mouth 2 (two) times daily.     Historical Provider, MD  ipratropium (ATROVENT) 0.06 % nasal spray Place 2 sprays into both nostrils 2 (two) times daily as needed for rhinitis.  05/04/13    Historical Provider, MD  levothyroxine (SYNTHROID, LEVOTHROID) 50 MCG tablet Take 50 mcg by mouth every morning.     Historical Provider, MD  meclizine (ANTIVERT) 25 MG tablet Take 25 mg by mouth daily as needed for dizziness.  05/07/13   Historical Provider, MD  Multiple Vitamins-Minerals (CENTRUM SILVER PO) Take 1 tablet by mouth daily with lunch.     Historical Provider, MD  ondansetron (ZOFRAN) 4 MG tablet Take 1 tablet (4 mg total) by mouth every 8 (eight) hours as needed for nausea or vomiting. 11/02/14   Lahoma Crocker, MD  Propylene Glycol (SYSTANE BALANCE OP) Apply 1 drop to eye 2 (two) times daily.    Historical Provider, MD  rOPINIRole (REQUIP) 0.25 MG tablet TAKE 1 TABLET BY MOUTH 3 TIMES A DAY 09/10/14   Asencion Partridge Dohmeier, MD  traMADol (ULTRAM) 50 MG tablet Take 1 tablet (50 mg total) by mouth every 6 (six) hours as needed. 11/02/14   Lahoma Crocker, MD   BP 169/86 mmHg  Pulse 67  Temp(Src) 97.9 F (36.6 C) (Oral)  Resp 22  SpO2 99%  LMP 09/03/1996 (Approximate) Physical Exam  Constitutional: She appears well-developed and well-nourished. No distress.  HENT:  Head: Normocephalic and atraumatic.  Right Ear: Hearing normal.  Left Ear: Hearing normal.  Nose: Nose normal.  Mouth/Throat: Oropharynx is clear and moist and mucous membranes are normal.  Eyes: Conjunctivae and EOM are normal. Pupils are equal, round, and reactive to light.  Neck: Normal range of motion. Neck supple.  Cardiovascular: Regular rhythm, S1 normal and S2 normal.  Exam reveals no gallop and no friction rub.   No murmur heard. Pulmonary/Chest: Effort normal and breath sounds normal. No respiratory distress. She exhibits no tenderness.  Abdominal: Soft. Normal appearance and bowel sounds are normal. There is no hepatosplenomegaly. There is no tenderness. There is no rebound, no guarding, no tenderness at McBurney's point and negative Murphy's sign. No hernia.  Musculoskeletal: Normal range of motion.   Neurological: She is alert. She has normal strength. No cranial nerve deficit. She exhibits abnormal muscle tone (right side no effort against gravity). Coordination normal. GCS eye subscore is 4. GCS verbal subscore is 1. GCS motor subscore is 6.  Skin: Skin is warm, dry and intact. No rash noted. No cyanosis.  Psychiatric: She has a normal mood and affect. Her speech is normal and behavior is normal. Thought content normal.  Nursing note and vitals reviewed.   ED Course  Procedures (including critical care time) Labs Review Labs Reviewed  I-STAT CHEM 8, ED - Abnormal; Notable for the  following:    Glucose, Bld 104 (*)    All other components within normal limits  ETHANOL  PROTIME-INR  APTT  CBC  DIFFERENTIAL  COMPREHENSIVE METABOLIC PANEL  URINE RAPID DRUG SCREEN ([place])  URINALYSIS, ROUTINE W REFLEX MICROSCOPIC  I-STAT TROPOININ, ED  I-STAT TROPOININ, ED    Imaging Review Ct Head Wo Contrast  12/25/2014   CLINICAL DATA:  [facility]- pt coming from home with stroke like symptoms. LSN at 9pm last night. Pt with right side weakness, right side facial droop, pt is non-verbal which is abnormal for her. Pt able to answer questions with head gestures. H/o anemia and chronic headaches.  EXAM: CT HEAD WITHOUT CONTRAST  TECHNIQUE: Contiguous axial images were obtained from the base of the skull through the vertex without intravenous contrast.  COMPARISON:  11/15/2011  FINDINGS: There is loss of the gray-white junction noted along the posterior left frontal lobe extending to the left temporal lobe. This is consistent with recent infarction.  There is no other evidence of a recent cortical infarct.  There are no parenchymal masses or mass effect. Mild periventricular white matter hypoattenuation is noted consistent with stable chronic microvascular ischemic change.  There are no extra-axial masses or abnormal fluid collections.  Ventricles are normal configuration. There is ventricular  enlargement consistent with atrophy, similar to the prior exam. No convincing hydrocephalus.  There is no intracranial hemorrhage.  Sinuses and mastoid air cells are clear.  No skull lesion.  IMPRESSION: 1. Recent, likely early subacute, left middle cerebral artery territory infarct involving the posterior left frontal lobe and left temporal lobe. 2. No other acute or recent abnormality. No other evidence of an infarct. 3. No intracranial hemorrhage.   Electronically Signed   By: Lajean Manes M.D.   On: 12/25/2014 08:09     EKG Interpretation None      MDM   Final diagnoses:  CVA (cerebral vascular accident)    Patient awakened this morning with dense neurologic deficit. Patient has complete aphasia and right-sided flaccidity consistent with stroke. Patient was last seen normal at bedtime at 9 PM last night. She awakened with her neuro deficit, was never a candidate for TPA. CT scan consistent with MCA infarct which explains patient's current deficits. Patient will require hospitalization for further management.    Orpah Greek, MD 12/25/14 [phone]

## 2014-12-25 NOTE — H&P (Signed)
[facility] History and Physical                                                                                    Linda [name], is a 81 y.o. female  MRN: [id number]   DOB - 14-Jul-1929  Admit Date - 12/25/2014  Outpatient Primary MD for the patient is [name], MD  Referring Physician:  Dr. Betsey Holiday, EDP  Chief Complaint:   Chief Complaint  Patient presents with  . Weakness     HPI  Lavaeh Bau  is a 81 y.o. female, with a past medical history of peptic ulcer disease, hypothyroidism, and  On March 09/2014 she underwent bilateral salpingo-oophorectomy due to right ovarian fibroma.  She presents to the emergency department on 4/23  AM with a left-sided stroke. The patient is a alert but aphasic, so history is gathered from her sons. Yesterday Ms. Scheck was in her usual state of health, but had been complaining of some dizziness for approximately one month. Further her son tells me that her doctor mentioned an irregular heartbeat about 2 weeks ago but this has not been worked up yet.  She was last seen normal at 9:30 PM last night just prior to going to bed. Her son, Carolin Quang, who lives with her, heard her making yelling noises this morning. He went and helped her out of bed to the bathroom and back but noted she was unable to form words and that she was dragging her right leg. He called 911.  In the emergency department CT scan of her head shows early subacute left MCA artery territory infarct.  Labs are essentially unrevealing. EKG shows normal sinus rhythm. Neurology has evaluated Mrs. Goodridge in the ER and recommends admission for stroke workup. Mrs. Oliveto will most likely need placement in skilled rehabilitation at discharge.   Review of Systems   Mrs. Roscher is unable to give a review of systems as she is a phasic, but her sons deny any recent cold symptoms, fever, congestion, headache, changes in vision, chest pain, difficulty breathing, changes in  bowel habits, difficulty with ambulating. They mention that she complained of chronic abdominal pain and that she most recently had some dizziness over the past month.  Past Medical History  Past Medical History  Diagnosis Date  . History of cardiac catheterization 2002    Negative  . Vertebral fracture   . Restless legs syndrome with nocturnal myoclonus 05/13/2013     requip controlled   . Abnormal uterine bleeding 0034,9179  . Hypothyroidism   . Arthritis   . Osteoporosis   . History of stomach ulcers   . History of jaundice as a child   . Anemia   . Head pain, chronic     "I have pain in the back of my head for months"  . Dizziness     TAKES MECLIZINE TO TREAT    Past Surgical History  Procedure Laterality Date  . Back surgery      x 3  . Breast surgery Bilateral     1 lump each breast removed and benign  . Nasal sinus surgery    . Dilation and curettage  of uterus  1970  . Catheterization  2003    cardiac cath  . Kyphoplasty  2010  . Cataract extraction Bilateral 2011  . Cholecystectomy  1995    Lap  . Cardiac catheterization  2002    "it was normal" per pt - no record found  . Robotic assisted salpingo oopherectomy Bilateral 11/02/2014    Procedure: ROBOTIC ASSISTED bilateral SALPINGO OOPHORECTOMY;  Surgeon: Everitt Amber, MD;  Location: [facility];  Service: Gynecology;  Laterality: Bilateral;  . Laparoscopy N/A 11/02/2014    Procedure: LAPAROSCOPY DIAGNOSTIC;  Surgeon: Everitt Amber, MD;  Location: [facility];  Service: Gynecology;  Laterality: N/A;      Social History History  Substance Use Topics  . Smoking status: Never Smoker   . Smokeless tobacco: Never Used  . Alcohol Use: No  Lives at home with her son Sheilla Maris. Was completely independent with ADLs, but uses a cane.  Family History Family History  Problem Relation Age of Onset  . Asthma Brother   . Osteoarthritis Son   . Hypertension Father   . Stroke Father   . Cancer Brother     stomach    Prior to  Admission medications   Medication Sig Start Date End Date Taking? Authorizing Provider  acetaminophen (TYLENOL) 500 MG tablet Take 1,000 mg by mouth every 6 (six) hours as needed. Usually only takes 2 x a  day   Yes Historical Provider, MD  aspirin EC 81 MG tablet Take 162 mg by mouth at bedtime.    Yes Historical Provider, MD  calcium gluconate 500 MG tablet Take 500 mg by mouth 2 (two) times daily. With 1000 units D3   Yes Historical Provider, MD  cholecalciferol (VITAMIN D) 1000 UNITS tablet Take 1,000 Units by mouth 2 (two) times daily. Take with calium   Yes Historical Provider, MD  Cranberry 200 MG CAPS Take 1 capsule by mouth daily with lunch.    Yes Historical Provider, MD  denosumab (PROLIA) 60 MG/ML SOLN injection Inject 60 mg into the skin every 6 (six) months. Administer in upper arm, thigh, or abdomen   Yes Historical Provider, MD  Flaxseed, Linseed, (FLAX SEED OIL) 1000 MG CAPS Take 1 capsule by mouth 2 (two) times daily.    Yes Historical Provider, MD  ipratropium (ATROVENT) 0.06 % nasal spray Place 2 sprays into both nostrils 2 (two) times daily as needed for rhinitis.  05/04/13  Yes Historical Provider, MD  levothyroxine (SYNTHROID, LEVOTHROID) 50 MCG tablet Take 50 mcg by mouth every morning.    Yes Historical Provider, MD  meclizine (ANTIVERT) 25 MG tablet Take 25 mg by mouth daily as needed for dizziness.  05/07/13  Yes Historical Provider, MD  metoprolol succinate (TOPROL-XL) 25 MG 24 hr tablet Take 25 mg by mouth daily.   Yes Historical Provider, MD  Multiple Vitamins-Minerals (CENTRUM SILVER PO) Take 1 tablet by mouth daily with lunch.    Yes Historical Provider, MD  ondansetron (ZOFRAN) 4 MG tablet Take 1 tablet (4 mg total) by mouth every 8 (eight) hours as needed for nausea or vomiting. 11/02/14  Yes Lahoma Crocker, MD  pantoprazole (PROTONIX) 40 MG tablet Take 40 mg by mouth daily.   Yes Historical Provider, MD  Propylene Glycol (SYSTANE BALANCE OP) Apply 1 drop to eye 2  (two) times daily.   Yes Historical Provider, MD  rOPINIRole (REQUIP) 0.25 MG tablet TAKE 1 TABLET BY MOUTH 3 TIMES A DAY 09/10/14  Yes Larey Seat, MD  traMADol (  ULTRAM) 50 MG tablet Take 1 tablet (50 mg total) by mouth every 6 (six) hours as needed. 11/02/14  Yes Lahoma Crocker, MD    Allergies  Allergen Reactions  . Ibuprofen Anaphylaxis    Passed out and had to be hospitalized  . Penicillins     Skin on head was crawling and very very sleepy  . Demerol [Meperidine Hcl] Nausea And Vomiting  . Dilaudid [Hydromorphone Hcl] Nausea And Vomiting  . Lyrica [Pregabalin]     "made me a zombie'  . Morphine And Related Nausea And Vomiting  . Percocet [Oxycodone-Acetaminophen] Nausea And Vomiting  . Vicodin [Hydrocodone-Acetaminophen] Nausea And Vomiting    Physical Exam  Vitals  Blood pressure 158/86, pulse 70, temperature 97.9 F (36.6 C), temperature source Oral, resp. rate 23, last menstrual period 09/03/1996, SpO2 99 %.   General:  Thin, well-developed, pleasant-appearing, alert female,lying in bed in NAD,  2 sons and close family friend at bedside area  Psych:  A phasic but is calm, appropriate, follows commands, appropriately answers by nodding or shaking her head.  Neuro:   Right-sided facial droop, no use of right arm, weakness noted in left arm, but able to raise against resistance. Weakness noted in right lower extremity, but she is able to move it somewhat.  ENT:  Ears and Eyes appear Normal, Conjunctivae clear, PER. Moist oral mucosa without erythema or exudates.  Neck:  Supple, No lymphadenopathy appreciated  Respiratory:  Symmetrical chest wall movement, Good air movement bilaterally, CTAB.  Cardiac:  RRR, No Murmurs, no LE edema noted, no JVD.    Abdomen:  Positive bowel sounds, Soft, Non tender, Non distended,  No masses appreciated  Skin:  No Cyanosis, Normal Skin Turgor, No Skin Rash or Bruise.    Data Review  CBC  Recent Labs Lab 12/25/14 0747  12/25/14 0801  WBC 8.1  --   HGB 13.1 14.6  HCT 40.3 43.0  PLT 323  --   MCV 96.4  --   MCH 31.3  --   MCHC 32.5  --   RDW 12.8  --   LYMPHSABS 1.6  --   MONOABS 0.6  --   EOSABS 0.3  --   BASOSABS 0.1  --     Chemistries   Recent Labs Lab 12/25/14 0747 12/25/14 0801  NA 140 142  K 4.3 4.3  CL 107 105  CO2 26  --   GLUCOSE 111* 104*  BUN 16 18  CREATININE 1.20* 1.10  CALCIUM 9.5  --   AST 31  --   ALT 27  --   ALKPHOS 111  --   BILITOT 0.6  --      Coagulation profile  Recent Labs Lab 12/25/14 0747  INR 1.13     Urinalysis    Component Value Date/Time   COLORURINE YELLOW 12/25/2014 0915   APPEARANCEUR CLEAR 12/25/2014 0915   LABSPEC 1.013 12/25/2014 0915   LABSPEC 1.020 10/29/2014 1435   PHURINE 6.5 12/25/2014 0915   GLUCOSEU NEGATIVE 12/25/2014 0915   GLUCOSEU Negative 10/29/2014 1435   HGBUR NEGATIVE 12/25/2014 0915   BILIRUBINUR NEGATIVE 12/25/2014 0915   BILIRUBINUR - 09/22/2014 1110   KETONESUR NEGATIVE 12/25/2014 0915   PROTEINUR NEGATIVE 12/25/2014 0915   PROTEINUR Trace 09/22/2014 1110   UROBILINOGEN 0.2 12/25/2014 0915   UROBILINOGEN 0.2 10/29/2014 1435   UROBILINOGEN negative 09/22/2014 1110   NITRITE NEGATIVE 12/25/2014 0915   NITRITE Negative 10/29/2014 1435   NITRITE - 09/22/2014 1110   LEUKOCYTESUR  NEGATIVE 12/25/2014 0915    Imaging results:   Ct Head Wo Contrast  12/25/2014   CLINICAL DATA:  [facility]- pt coming from home with stroke like symptoms. LSN at 9pm last night. Pt with right side weakness, right side facial droop, pt is non-verbal which is abnormal for her. Pt able to answer questions with head gestures. H/o anemia and chronic headaches.  EXAM: CT HEAD WITHOUT CONTRAST  TECHNIQUE: Contiguous axial images were obtained from the base of the skull through the vertex without intravenous contrast.  COMPARISON:  11/15/2011  FINDINGS: There is loss of the gray-white junction noted along the posterior left frontal lobe extending  to the left temporal lobe. This is consistent with recent infarction.  There is no other evidence of a recent cortical infarct.  There are no parenchymal masses or mass effect. Mild periventricular white matter hypoattenuation is noted consistent with stable chronic microvascular ischemic change.  There are no extra-axial masses or abnormal fluid collections.  Ventricles are normal configuration. There is ventricular enlargement consistent with atrophy, similar to the prior exam. No convincing hydrocephalus.  There is no intracranial hemorrhage.  Sinuses and mastoid air cells are clear.  No skull lesion.  IMPRESSION: 1. Recent, likely early subacute, left middle cerebral artery territory infarct involving the posterior left frontal lobe and left temporal lobe. 2. No other acute or recent abnormality. No other evidence of an infarct. 3. No intracranial hemorrhage.   Electronically Signed   By: Lajean Manes M.D.   On: 12/25/2014 08:09    My personal review of EKG: NSR,  prolonged PR interval, QT interval is not prolonged.   Assessment & Plan  Active Problems:   CVA (cerebral vascular accident)  Left MCA infarct Admitted to telemetry for stroke workup Neuro consulted Will place on daily 325 mg aspirin Nothing by mouth until swallow eval. I am concerned that she will not be able to eat. Lacresha Fusilier is the MPOA.  Hypothyroidism Continue Synthroid IV  PUD Continue PPI IV  Chronic abdominal pain Kpad and supportive care.  HTN Hold metoprolol to allow permissive HTN.   Consultants Called:  Neuro  Family Communication:   Both sons and family friend at bedside.  Ajayla Iglesias is POA and lives with the patient.  Code Status:  Full, but does not want long term life support.  Condition:  Guarded.  Potential Disposition: likely SNF in 24-48 hours.  Time spent in minutes : [address],  PA-C on 12/25/2014 at 10:23 AM Between 7am to 7pm - Pager - [phone] After 7pm go to  [email or web address] - password [email or web address] And look for the night coverage person covering me after hours  [facility]

## 2014-12-25 NOTE — ED Notes (Signed)
Neurology at bedside.

## 2014-12-26 DIAGNOSIS — E039 Hypothyroidism, unspecified: Secondary | ICD-10-CM

## 2014-12-26 LAB — LIPID PANEL
Cholesterol: 113 mg/dL (ref 0–200)
HDL: 49 mg/dL (ref >39)
LDL CALC: 52 mg/dL (ref 0–99)
TRIGLYCERIDES: 58 mg/dL (ref <150)
Total CHOL/HDL Ratio: 2.3 RATIO
VLDL: 12 mg/dL (ref 0–40)

## 2014-12-26 MED ORDER — DEXTROSE-NACL 5-0.9 % IV SOLN
INTRAVENOUS | Status: DC
  Administered 2014-12-26 – 2014-12-27 (×3): via INTRAVENOUS

## 2014-12-26 NOTE — Evaluation (Signed)
Speech Language Pathology Evaluation Patient Details Name: Linda [name] MRN: [id number] DOB: [birth year] Today's Date: 12/26/2014 Time: 0109-3235 SLP Time Calculation (min) (ACUTE ONLY): 15 min  Problem List:  Patient Active Problem List   Diagnosis Date Noted  . CVA (cerebral vascular accident) 12/25/2014  . Left acute arterial ischemic stroke, MCA (middle cerebral artery) 12/25/2014  . Essential hypertension 12/25/2014  . Dysphagia, pharyngoesophageal phase 12/25/2014  . Hypothyroidism 12/25/2014  . Abdominal pain 11/02/2014  . Restless legs syndrome with nocturnal myoclonus 05/13/2013   Past Medical History:  Past Medical History  Diagnosis Date  . History of cardiac catheterization 2002    Negative  . Vertebral fracture   . Restless legs syndrome with nocturnal myoclonus 05/13/2013     requip controlled   . Abnormal uterine bleeding 5732,2025  . Hypothyroidism   . Arthritis   . Osteoporosis   . History of stomach ulcers   . History of jaundice as a child   . Anemia   . Head pain, chronic     "I have pain in the back of my head for months"  . Dizziness     TAKES MECLIZINE TO TREAT   Past Surgical History:  Past Surgical History  Procedure Laterality Date  . Back surgery      x 3  . Breast surgery Bilateral     1 lump each breast removed and benign  . Nasal sinus surgery    . Dilation and curettage of uterus  1970  . Catheterization  2003    cardiac cath  . Kyphoplasty  2010  . Cataract extraction Bilateral 2011  . Cholecystectomy  1995    Lap  . Cardiac catheterization  2002    "it was normal" per pt - no record found  . Robotic assisted salpingo oopherectomy Bilateral 11/02/2014    Procedure: ROBOTIC ASSISTED bilateral SALPINGO OOPHORECTOMY;  Surgeon: Everitt Amber, MD;  Location: [facility];  Service: Gynecology;  Laterality: Bilateral;  . Laparoscopy N/A 11/02/2014    Procedure: LAPAROSCOPY DIAGNOSTIC;  Surgeon: Everitt Amber, MD;  Location: [facility];  Service:  Gynecology;  Laterality: N/A;   HPI:  Linda [name] is an 81 y.o. female who presents with difficulty communicating and right-sided weakness. CT shows early subacute left MCA territory infarct; MRI  acute nonhemorrhagic left MCA territory infarct involving the insular cortex and operculum. 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   Assessment / Plan / Recommendation Clinical Impression  Limited speech/language evaluation completed given pt's level of fatigue.  Pt presents with aphasia, with no efforts to communicate verbally; no phonation.  She demonstrates preserved ability to follow highly contextual one-step commands, and responds to yes/no questions about her basic needs with a head nod/shake.  Recommend SLP intervention and further therapeutic assessment as LOA allows.                  Frequency and Duration min 3x week  2 weeks   Pertinent Vitals/Pain Pain Assessment: Faces Faces Pain Scale: Hurts a little bit Pain Location: Generalized; noted minimal grimasing with movement; very likely related to effort Pain Descriptors / Indicators:  (minimal grimace) Pain Intervention(s): Monitored during session   SLP Goals  Potential to Achieve Goals (ACUTE ONLY): Fair  SLP Evaluation Prior Functioning  Cognitive/Linguistic Baseline: Within functional limits Type of Home: House Available Help at Discharge: Family;Available PRN/intermittently   Cognition  Overall Cognitive Status: Impaired/Different from baseline Arousal/Alertness: Lethargic Orientation Level: Other (comment) (UTA)  Comprehension  Auditory Comprehension Overall Auditory Comprehension: Impaired Yes/No Questions: Impaired Basic Biographical Questions: 0-25% accurate Commands: Impaired One Step Basic Commands: 50-74% accurate Visual Recognition/Discrimination Discrimination: Not tested    Expression Verbal Expression Overall Verbal  Expression: Impaired Initiation: Impaired Automatic Speech:  (no attempts to vocalize) Level of Generative/Spontaneous Verbalization:  (none) Non-Verbal Means of Communication: Gestures (head nod/shake for yes/no) Written Expression Dominant Hand: Right   Oral / Motor Oral Motor/Sensory Function Overall Oral Motor/Sensory Function: Impaired Motor Speech Overall Motor Speech: Impaired   Emmamae Mcnamara L. Tivis Ringer, [place] CCC/SLP Pager [phone]      Juan Quam Laurice 12/26/2014, 10:24 AM

## 2014-12-26 NOTE — Progress Notes (Signed)
Speech Language Pathology Treatment: Dysphagia  Patient Details Name: Linda [name] MRN: [id number] DOB: 20-Apr-1929 Today's Date: 12/26/2014 Time: 6213-0865 SLP Time Calculation (min) (ACUTE ONLY): 10 min  Assessment / Plan / Recommendation Clinical Impression  F/u after 4/23 initial swallow assessment: pt continues to have difficulty sustaining wakefulness in order to eat/drink safely.  With max verbal/tactile cueing, there is improved attention to bolus, but signs of likely premature spillage and coughing with liquids.  There is a significant delay from time of puree bolus presentation to lips to swallow response.  Pt with no purposeful vocalization; max cues required to open eyes and to keep head in neutral posture (maintained in extension while sitting upright in recliner, posing further risk of aspiration).  Given current MS, pt not yet appropriate to begin oral alimentation.  Recommend continuing NPO status - SLP to follow next date for readiness; will likely need instrumental swallow study.        HPI HPI: Linda [name] is an 81 y.o. female who presents with difficulty communicating and right-sided weakness. CT shows early subacute left MCA territory infarct; MRI pending. 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   Pertinent Vitals Pain Assessment: Faces Faces Pain Scale: Hurts a little bit Pain Location: Generalized; noted minimal grimasing with movement; very likely related to effort Pain Descriptors / Indicators:  (minimal grimace) Pain Intervention(s): Monitored during session  SLP Plan  Continue with current plan of care    Recommendations Diet recommendations: NPO              Oral Care Recommendations: Oral care Q4 per protocol Plan: Continue with current plan of care   [name], [place] CCC/SLP Pager [phone]      Linda [name] 12/26/2014, 10:05 AM

## 2014-12-26 NOTE — Progress Notes (Signed)
Utilization review completed.  

## 2014-12-26 NOTE — Progress Notes (Signed)
STROKE TEAM PROGRESS NOTE   HISTORY Linda [name] is an 81 y.o. female unremarkable past medical history presenting after waking up with right sided weakness and difficulty speaking. Patient went to bed at 2100 in normal state of health. Upon waking noted to be weak on right sided and unable to communicate. No prior CVA or TIA history. No known history of A fib.   Of note her sons state that she was told by a doctor that she may have an "irregular heartbeat". Further testing was pending on this.   CT head imaging reviewed shows likely L MCA territory infarct.   Date last known well: 12/24/2014 Time last known well: 2100 tPA Given: no, outside tPA window   SUBJECTIVE (INTERVAL HISTORY) Son at bedside. Patient was very independent and healthy. Discussed stroke, likely chronic deficits and placement.    OBJECTIVE Temp:  [97.5 F (36.4 C)-99.1 F (37.3 C)] 98.1 F (36.7 C) (04/24 0521) Pulse Rate:  [68-103] 82 (04/24 0521) Cardiac Rhythm:  [-] Atrial fibrillation (04/24 0000) Resp:  [16-35] 16 (04/24 0521) BP: (123-170)/(63-96) 130/63 mmHg (04/24 0521) SpO2:  [94 %-99 %] 97 % (04/24 0521)  No results for input(s): GLUCAP in the last 168 hours.  Recent Labs Lab 12/25/14 0747 12/25/14 0801  NA 140 142  K 4.3 4.3  CL 107 105  CO2 26  --   GLUCOSE 111* 104*  BUN 16 18  CREATININE 1.20* 1.10  CALCIUM 9.5  --     Recent Labs Lab 12/25/14 0747  AST 31  ALT 27  ALKPHOS 111  BILITOT 0.6  PROT 6.9  ALBUMIN 3.4*    Recent Labs Lab 12/25/14 0747 12/25/14 0801  WBC 8.1  --   NEUTROABS 5.6  --   HGB 13.1 14.6  HCT 40.3 43.0  MCV 96.4  --   PLT 323  --    No results for input(s): CKTOTAL, CKMB, CKMBINDEX, TROPONINI in the last 168 hours.  Recent Labs  12/25/14 0747  LABPROT 14.7  INR 1.13    Recent Labs  12/25/14 0915  COLORURINE YELLOW  LABSPEC 1.013  PHURINE 6.5  GLUCOSEU NEGATIVE  HGBUR NEGATIVE  BILIRUBINUR NEGATIVE  KETONESUR NEGATIVE   PROTEINUR NEGATIVE  UROBILINOGEN 0.2  NITRITE NEGATIVE  LEUKOCYTESUR NEGATIVE    No results found for: CHOL, TRIG, HDL, CHOLHDL, VLDL, LDLCALC No results found for: HGBA1C    Component Value Date/Time   LABOPIA NONE DETECTED 12/25/2014 0915   COCAINSCRNUR NONE DETECTED 12/25/2014 0915   LABBENZ NONE DETECTED 12/25/2014 0915   AMPHETMU NONE DETECTED 12/25/2014 0915   THCU NONE DETECTED 12/25/2014 0915   LABBARB NONE DETECTED 12/25/2014 0915     Recent Labs Lab 12/25/14 0747  ETH <5    Dg Chest 2 View 12/25/2014    Findings consistent with mild fluid overload with vascular congestion and small bilateral effusions and bibasilar atelectasis.     Ct Head Wo Contrast 12/25/2014    1. Recent, likely early subacute, left middle cerebral artery territory infarct involving the posterior left frontal lobe and left temporal lobe.  2. No other acute or recent abnormality. No other evidence of an infarct.  3. No intracranial hemorrhage.      Mr Brain Wo Contrast 12/25/2014    1. Acute nonhemorrhagic left MCA territory infarct involving the insular cortex and operculum.  2. Remote anterior left right frontal lobe infarct.  3. Moderate generalized atrophy.  4. The MRA demonstrates occlusion of the anterior left M2 division,  likely associated with the acute infarct.  5. Mild distal small vessel disease.  6. No other significant proximal stenosis, aneurysm, or branch vessel occlusion.       PHYSICAL EXAM  PHYSICAL EXAM Physical exam: Exam: Gen: NAD Eyes: anicteric sclerae, moist conjunctivae                    CV: irregular, no MRG, no carotid bruits, no peripheral edema Mental Status: Alert, does not follow commands, no eye contact  Speech:   Global aphasia  Cranial Nerves:    The pupils are equal, round, and reactive to light.. Attempted, Fundi not visualized.  EOMI. No gaze preference. right hemianopia. Left gaze preference. Face symmetric, Tongue midline. Nonverbal, cannot  assess voice. Intact gag and SCM.   Motor Observation:    no involuntary movements noted. Tone appears normal.     Strength:   Right hemiplegia and decreased tone of right arm > right leg. No spontaneous movements of right side.  Left side antigravity.       Sensation:  Withdraws left.   Plantars equivocal..     ASSESSMENT/PLAN  Linda [name] is a 81 y.o. female with an unremarkable past medical history except for irregular heartbeats  presenting with right-sided weakness and difficulty speaking. She did not receive IV t-PA due to late presentation.  Stroke:  Dominant - left MCA territory infarct - probably embolic secondary to atrial fibrillation.  Resultant right hemiparesis  MRI   Acute nonhemorrhagic left MCA territory infarct involving the insular cortex and operculum.   MRA  Occlusion of the anterior left M2 division, likely associated with the acute infarct.   Carotid Doppler - pending  2D Echo - pending  LDL - 52  HgbA1c pending  Subcutaneous heparin for VTE prophylaxis  Diet NPO time specified  aspirin 81 mg orally every day prior to admission, now on aspirin 300 mg suppository daily. Needs discussion with son regarding Eliquis.   Ongoing aggressive stroke risk factor management  Therapy recommendations: CIR recommended - preadmission screening pending.  Disposition:  Pending  Hypertension  Home meds: Toprol-XL  Stable   Hyperlipidemia  Home meds: No lipid lowering medications prior to admission.  LDL 52, goal < 70    Other Stroke Risk Factors  Advanced age  Family hx stroke (Father)   Other Active Problems  Paroxysmal atrial fibrillation noted on telemetry  Other Pertinent History    Hospital day # 1  Personally examined patient and images, and have documentes history, physical, neuro exam,assessment and plan as stated above.   Sarina Ill, MD Stroke Neurology [phone] [facility]    To contact  Stroke Continuity provider, please refer to [email or web address] After hours, contact General Neurology

## 2014-12-26 NOTE — Progress Notes (Signed)
PATIENT DETAILS Name: Linda [name] Age: 81 y.o. Sex: female Date of Birth: [birth year] Admit Date: 12/25/2014 Admitting Physician Orson Eva, MD HGD:[id number] DAVIDSON, MD  Subjective: Aphasic. Follows commands. Significant right-sided weakness  Assessment/Plan: Active Problems:   Acute CVA (cerebral vascular accident): Admitted with aphasia and right-sided weakness. CT head negative for acute changes, MRI brain (images personally reviewed) shows acute infarct involving the insular cortex and operculum. Carotid Doppler and echocardiogram currently pending. LDL at goal-52. A1c pending.Continues to have significant right-sided deficits, unfortunately seems to have developed significant dysphagia, remains nothing by mouth. Await further evaluation from speech therapy. Continue aspirin and follow.    Essential hypertension: All antihypertensives remain on hold-allow permissive hypertension. Resume antihypertensives when able-currently nothing by mouth-as has developed significant dysphagia.    Dysphagia: Likely secondary to CVA. We will need to follow over the next few days to see if able to recover some function in able to safely swallow. Otherwise will need to talk with family regarding hospice/comfort measures. Past son at bedside to get patient's prior healthcare directives.    Hypothyroidism: Continue with IV levothyroxine-change to oral when able    GERD: Continue PPI    Restless leg syndrome: Resume prior medications with oral intake has resumed   Disposition: Remain inpatient  Antimicrobial agents  See below  Anti-infectives    None      DVT Prophylaxis: Prophylactic Heparin  Code Status: Full code   Family Communication Son-John Hurd at bedside. Extensive discussion regarding CODE STATUS-remains a full code. Family will get patient's advanced directives-especially if dysphagia continues.   Procedures: None  CONSULTS:   neurology  Time spent 40 minutes-which includes 50% of the time with face-to-face with patient/ family and coordinating care with Neurology- related to the above assessment and plan.  MEDICATIONS: Scheduled Meds: .  stroke: mapping our early stages of recovery book   Does not apply Once  . antiseptic oral rinse  7 mL Mouth Rinse BID  . aspirin  300 mg Rectal Daily   Or  . aspirin  325 mg Oral Daily  . heparin  5,000 Units Subcutaneous 3 times per day  . levothyroxine  25 mcg Intravenous Daily  . pantoprazole (PROTONIX) IV  40 mg Intravenous Q24H   Continuous Infusions:  PRN Meds:.acetaminophen **OR** acetaminophen, ipratropium, senna-docusate    PHYSICAL EXAM: Vital signs in last 24 hours: Filed Vitals:   12/26/14 0305 12/26/14 0521 12/26/14 0952 12/26/14 1314  BP:  130/63 121/70 129/70  Pulse:  82 94 102  Temp: 98.2 F (36.8 C) 98.1 F (36.7 C) 98.4 F (36.9 C) 98.4 F (36.9 C)  TempSrc:  Axillary Axillary Axillary  Resp:  16 16 16   SpO2:  97% 99% 98%    Weight change:  There were no vitals filed for this visit. There is no weight on file to calculate BMI.   Gen Exam: Awake, aphasic.   Neck: Supple, No JVD.   Chest: B/L Clear.   CVS: S1 S2 Regular, no murmurs.  Abdomen: soft, BS +, non tender, non distended.  Extremities: no edema, lower extremities warm to touch. Neurologic: Right upper ext 0/5,  Right lower 2/5 Skin: No Rash.   Wounds: N/A.   Intake/Output from previous day: No intake or output data in the 24 hours ending 12/26/14 1522   LAB RESULTS: CBC  Recent Labs Lab 12/25/14 0747 12/25/14 0801  WBC 8.1  --  HGB 13.1 14.6  HCT 40.3 43.0  PLT 323  --   MCV 96.4  --   MCH 31.3  --   MCHC 32.5  --   RDW 12.8  --   LYMPHSABS 1.6  --   MONOABS 0.6  --   EOSABS 0.3  --   BASOSABS 0.1  --     Chemistries   Recent Labs Lab 12/25/14 0747 12/25/14 0801  NA 140 142  K 4.3 4.3  CL 107 105  CO2 26  --   GLUCOSE 111* 104*  BUN 16 18   CREATININE 1.20* 1.10  CALCIUM 9.5  --     CBG: No results for input(s): GLUCAP in the last 168 hours.  GFR CrCl cannot be calculated (Unknown ideal weight.).  Coagulation profile  Recent Labs Lab 12/25/14 0747  INR 1.13    Cardiac Enzymes No results for input(s): CKMB, TROPONINI, MYOGLOBIN in the last 168 hours.  Invalid input(s): CK  Invalid input(s): POCBNP No results for input(s): DDIMER in the last 72 hours. No results for input(s): HGBA1C in the last 72 hours.  Recent Labs  12/26/14 0732  CHOL 113  HDL 49  LDLCALC 52  TRIG 58  CHOLHDL 2.3   No results for input(s): TSH, T4TOTAL, T3FREE, THYROIDAB in the last 72 hours.  Invalid input(s): FREET3 No results for input(s): VITAMINB12, FOLATE, FERRITIN, TIBC, IRON, RETICCTPCT in the last 72 hours. No results for input(s): LIPASE, AMYLASE in the last 72 hours.  Urine Studies No results for input(s): UHGB, CRYS in the last 72 hours.  Invalid input(s): UACOL, UAPR, USPG, UPH, UTP, UGL, UKET, UBIL, UNIT, UROB, ULEU, UEPI, UWBC, URBC, UBAC, CAST, UCOM, BILUA  MICROBIOLOGY: No results found for this or any previous visit (from the past 240 hour(s)).  RADIOLOGY STUDIES/RESULTS: Dg Chest 2 View  12/25/2014   CLINICAL DATA:  Stroke  EXAM: CHEST  2 VIEW  COMPARISON:  05/27/2009  FINDINGS: Bibasilar atelectasis and effusion. Vascular congestion with probable fluid overload. No significant edema.  Vertebroplasty in the mid thoracic spine two levels. Lungs are hyperinflated.  IMPRESSION: Findings consistent with mild fluid overload with vascular congestion and small bilateral effusions and bibasilar atelectasis.   Electronically Signed   By: Franchot Gallo M.D.   On: 12/25/2014 14:13   Ct Head Wo Contrast  12/25/2014   CLINICAL DATA:  [facility]- pt coming from home with stroke like symptoms. LSN at 9pm last night. Pt with right side weakness, right side facial droop, pt is non-verbal which is abnormal for her. Pt able to  answer questions with head gestures. H/o anemia and chronic headaches.  EXAM: CT HEAD WITHOUT CONTRAST  TECHNIQUE: Contiguous axial images were obtained from the base of the skull through the vertex without intravenous contrast.  COMPARISON:  11/15/2011  FINDINGS: There is loss of the gray-white junction noted along the posterior left frontal lobe extending to the left temporal lobe. This is consistent with recent infarction.  There is no other evidence of a recent cortical infarct.  There are no parenchymal masses or mass effect. Mild periventricular white matter hypoattenuation is noted consistent with stable chronic microvascular ischemic change.  There are no extra-axial masses or abnormal fluid collections.  Ventricles are normal configuration. There is ventricular enlargement consistent with atrophy, similar to the prior exam. No convincing hydrocephalus.  There is no intracranial hemorrhage.  Sinuses and mastoid air cells are clear.  No skull lesion.  IMPRESSION: 1. Recent, likely early subacute, left middle  cerebral artery territory infarct involving the posterior left frontal lobe and left temporal lobe. 2. No other acute or recent abnormality. No other evidence of an infarct. 3. No intracranial hemorrhage.   Electronically Signed   By: Lajean Manes M.D.   On: 12/25/2014 08:09   Mr Brain Wo Contrast  12/25/2014   CLINICAL DATA:  Aphasia. Stroke. Right-sided weakness. Abnormal CT of the head  EXAM: MRI HEAD WITHOUT CONTRAST  MRA HEAD WITHOUT CONTRAST  TECHNIQUE: Multiplanar, multiecho pulse sequences of the brain and surrounding structures were obtained without intravenous contrast. Angiographic images of the head were obtained using MRA technique without contrast.  COMPARISON:  CT head from the same day.  FINDINGS: MRI HEAD FINDINGS  A left MCA territory infarct is confirmed. This involves the insular cortex and operculum. DWI signal in the anterior right frontal lobe represents T2 shine through from a  remote or subacute infarct.  T2 changes are associated with remote anterior right frontal lobe infarct. There is minimal subtle change within the acute left MCA territory infarct. Moderate generalized atrophy is present. The ventricles are proportionate to the degree of atrophy.  Flow is present in the major intracranial arteries. Bilateral lens replacements are present.  Mild mucosal thickening is scattered throughout the ethmoid air cells. The mastoid air cells are clear. The remaining paranasal sinuses are clear. The skullbase is unremarkable. Midline structures are within normal limits.  MRA HEAD FINDINGS  The MRA is degraded by patient motion. Internal carotid arteries are within normal limits from the high cervical segments through the ICA termini. The A1 and M1 segments are normal. The anterior communicating artery is patent. The MCA bifurcations are intact. The anterior left M2 segment is occluded approximately 10 mm from its origin. There is asymmetric attenuation of left MCA branch vessels compared to the right. The ACA branch vessels are within normal limits.  The left vertebral artery is dominant. The right vertebral artery terminates at the PICA. The left PICA is not visualized. A left AICA is present. The left PCA originates from the basilar tip. The right PCA is of fetal type.  IMPRESSION: 1. Acute nonhemorrhagic left MCA territory infarct involving the insular cortex and operculum. 2. Remote anterior left right frontal lobe infarct. 3. Moderate generalized atrophy. 4. The MRA demonstrates occlusion of the anterior left M2 division, likely associated with the acute infarct. 5. Mild distal small vessel disease. 6. No other significant proximal stenosis, aneurysm, or branch vessel occlusion.   Electronically Signed   By: San Morelle M.D.   On: 12/25/2014 14:21   Mr Jodene Nam Head/brain Wo Cm  12/25/2014   CLINICAL DATA:  Aphasia. Stroke. Right-sided weakness. Abnormal CT of the head  EXAM: MRI  HEAD WITHOUT CONTRAST  MRA HEAD WITHOUT CONTRAST  TECHNIQUE: Multiplanar, multiecho pulse sequences of the brain and surrounding structures were obtained without intravenous contrast. Angiographic images of the head were obtained using MRA technique without contrast.  COMPARISON:  CT head from the same day.  FINDINGS: MRI HEAD FINDINGS  A left MCA territory infarct is confirmed. This involves the insular cortex and operculum. DWI signal in the anterior right frontal lobe represents T2 shine through from a remote or subacute infarct.  T2 changes are associated with remote anterior right frontal lobe infarct. There is minimal subtle change within the acute left MCA territory infarct. Moderate generalized atrophy is present. The ventricles are proportionate to the degree of atrophy.  Flow is present in the major intracranial arteries. Bilateral lens  replacements are present.  Mild mucosal thickening is scattered throughout the ethmoid air cells. The mastoid air cells are clear. The remaining paranasal sinuses are clear. The skullbase is unremarkable. Midline structures are within normal limits.  MRA HEAD FINDINGS  The MRA is degraded by patient motion. Internal carotid arteries are within normal limits from the high cervical segments through the ICA termini. The A1 and M1 segments are normal. The anterior communicating artery is patent. The MCA bifurcations are intact. The anterior left M2 segment is occluded approximately 10 mm from its origin. There is asymmetric attenuation of left MCA branch vessels compared to the right. The ACA branch vessels are within normal limits.  The left vertebral artery is dominant. The right vertebral artery terminates at the PICA. The left PICA is not visualized. A left AICA is present. The left PCA originates from the basilar tip. The right PCA is of fetal type.  IMPRESSION: 1. Acute nonhemorrhagic left MCA territory infarct involving the insular cortex and operculum. 2. Remote  anterior left right frontal lobe infarct. 3. Moderate generalized atrophy. 4. The MRA demonstrates occlusion of the anterior left M2 division, likely associated with the acute infarct. 5. Mild distal small vessel disease. 6. No other significant proximal stenosis, aneurysm, or branch vessel occlusion.   Electronically Signed   By: San Morelle M.D.   On: 12/25/2014 14:21    Oren Binet, MD  [facility] Pager:[phone]  If 7PM-7AM, please contact night-coverage [email or web address] Password [email or web address] 12/26/2014, 3:22 PM   LOS: 1 day

## 2014-12-26 NOTE — Evaluation (Signed)
Physical Therapy Evaluation Patient Details Name: Linda [name] MRN: [id number] DOB: [birth year] Today's Date: 12/26/2014   History of Present Illness  Linda [name] is an 81 y.o. female who presents with difficulty communicating and right-sided weakness. CT shows early subacute left MCA territory infarct; MRI pending. 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   Clinical Impression   Pt admitted with above diagnosis. Pt currently with functional limitations due to the deficits listed below (see PT Problem List).  Pt will benefit from skilled PT to increase their independence and safety with mobility to allow discharge to the venue listed below.       Follow Up Recommendations CIR    Equipment Recommendations  Other (comment) (to be determined)    Recommendations for Other Services Other (comment) (Case Mgmnt: how much in-home assist can her insurance cover?)     Precautions / Restrictions Precautions Precautions: Fall      Mobility  Bed Mobility Overal bed mobility: Needs Assistance Bed Mobility: Rolling;Sidelying to Sit Rolling: Mod assist;Max assist Sidelying to sit: Mod assist       General bed mobility comments: Rolled to Right side with heavy mod assist and lots of cues to reach for rail; max assis to roll L; noted she did push with LUE through elbow and hand to go sidelie to sit; heavy mod assist to acheive fully upright sitting  Transfers Overall transfer level: Needs assistance Equipment used: 1 person hand held assist Transfers: Sit to/from [facility] Sit to Stand: Mod assist Stand pivot transfers: Max assist       General transfer comment: Light mod assist for sit to stand, with noted initating with anterior weight shift and push through bil LEs; dec cotrol with dymanic stepping, and she required knee block with transitionto chair  Ambulation/Gait                 Stairs            Wheelchair Mobility    Modified Rankin (Stroke Patients Only) Modified Rankin (Stroke Patients Only) Pre-Morbid Rankin Score: No symptoms Modified Rankin: Severe disability     Balance Overall balance assessment: Needs assistance   Sitting balance-Leahy Scale: Poor (approaching Fair) Sitting balance - Comments: tending to lean posteriorly in sitting; Able to organize trunk with tactiel cues for more midline Postural control: Posterior lean   Standing balance-Leahy Scale: Zero                               Pertinent Vitals/Pain Pain Assessment: Faces Faces Pain Scale: Hurts a little bit Pain Location: Generalized; noted minimal grimasing with movement; very likely related to effort Pain Descriptors / Indicators:  (minimal grimace) Pain Intervention(s): Monitored during session    [place] expects to be discharged to:: Private residence Living Arrangements: Children Available Help at Discharge: Family;Available PRN/intermittently (Son is interested in finding out if/how much HH services her insurance covers) Type of Home: House Home Access: Stairs to enter Entrance Stairs-Rails: Left Entrance Stairs-Number of Steps: 4 Home Layout: Two level;Laundry or work area in basement;Able to live on main level with bedroom/bathroom Home Equipment: Linda [name] - single point      Prior Function Level of Independence: Independent with assistive device(s)         Comments: ambulates with cane; still driving short distances     Hand Dominance   Dominant Hand: Right    Extremity/Trunk  Assessment   Upper Extremity Assessment: Defer to OT evaluation (minimal active movement RUE)           Lower Extremity Assessment: RLE deficits/detail RLE Deficits / Details: Hip, knee and ankle full ROM available; noted muscle resistence to passive ROM of hip and knee while in the bed, which could be increased tone, however could also be  related to pt holding and not quite understanding the task    Cervical / Trunk Assessment: Other exceptions  Communication   Communication: Expressive difficulties  Cognition Arousal/Alertness: Lethargic (Eyes closed at least 75% of session) Behavior During Therapy:  (VERY sleepy) Overall Cognitive Status: Impaired/Different from baseline Area of Impairment: Attention;Following commands   Current Attention Level:  (Noted decr attention to right side)   Following Commands: Follows one step commands with increased time ("open your eyes" "squeeze my hands" "look at your son")       General Comments: Difficult to conduct a thorough assessmnent of cognition with decr arousal; Still, pt followed simple commands including organizing trunk with anterior weight shift and pushing through feet when asked to stand    General Comments General comments (skin integrity, edema, etc.): Educated pt's son re: rehab options and criteria for qualifying for CIR; also educated re: interacting with pt from the Right side and to pay attention to positioing of RUE    Exercises        Assessment/Plan    PT Assessment Patient needs continued PT services  PT Diagnosis Difficulty walking;Abnormality of gait;Hemiplegia dominant side   PT Problem List Decreased strength;Decreased activity tolerance;Decreased balance;Decreased mobility;Decreased coordination;Decreased cognition;Decreased knowledge of use of DME;Decreased safety awareness;Decreased knowledge of precautions;Impaired tone  PT Treatment Interventions DME instruction;Gait training;Functional mobility training;Therapeutic activities;Therapeutic exercise;Balance training;Neuromuscular re-education;Cognitive remediation;Patient/family education;Wheelchair mobility training   PT Goals (Current goals can be found in the Care Plan section) Acute Rehab PT Goals Patient Stated Goal: unable to state, but appeared to relax in recliner PT Goal Formulation: With  patient/family Time For Goal Achievement: 01/09/15 Potential to Achieve Goals: Good    Frequency Min 4X/week   Barriers to discharge Decreased caregiver support      Co-evaluation               End of Session Equipment Utilized During Treatment: Gait belt Activity Tolerance: Patient tolerated treatment well Patient left: in chair;with call bell/phone within reach;with chair alarm set;with family/visitor present Nurse Communication: Mobility status         Time: 7209-4709 PT Time Calculation (min) (ACUTE ONLY): 31 min   Charges:   PT Evaluation $Initial PT Evaluation Tier I: 1 Procedure PT Treatments $Therapeutic Activity: 8-22 mins   PT G Codes:        Quin Hoop 12/26/2014, 9:47 AM  Roney Marion, PT  Acute Rehabilitation Services Pager [phone] Office [phone]

## 2014-12-27 DIAGNOSIS — I4891 Unspecified atrial fibrillation: Secondary | ICD-10-CM

## 2014-12-27 DIAGNOSIS — I6789 Other cerebrovascular disease: Secondary | ICD-10-CM

## 2014-12-27 DIAGNOSIS — I480 Paroxysmal atrial fibrillation: Secondary | ICD-10-CM

## 2014-12-27 DIAGNOSIS — E43 Unspecified severe protein-calorie malnutrition: Secondary | ICD-10-CM | POA: Insufficient documentation

## 2014-12-27 DIAGNOSIS — I639 Cerebral infarction, unspecified: Secondary | ICD-10-CM

## 2014-12-27 LAB — BASIC METABOLIC PANEL
ANION GAP: 7 (ref 5–15)
BUN: 16 mg/dL (ref 6–23)
CO2: 22 mmol/L (ref 19–32)
CREATININE: 0.86 mg/dL (ref 0.50–1.10)
Calcium: 7.7 mg/dL — ABNORMAL LOW (ref 8.4–10.5)
Chloride: 109 mmol/L (ref 96–112)
GFR calc non Af Amer: 60 mL/min — ABNORMAL LOW (ref >90)
GFR, EST AFRICAN AMERICAN: 70 mL/min — AB (ref >90)
Glucose, Bld: 121 mg/dL — ABNORMAL HIGH (ref 70–99)
Potassium: 3.3 mmol/L — ABNORMAL LOW (ref 3.5–5.1)
Sodium: 138 mmol/L (ref 135–145)

## 2014-12-27 LAB — HEMOGLOBIN A1C
Hgb A1c MFr Bld: 5.9 % — ABNORMAL HIGH (ref 4.8–5.6)
Mean Plasma Glucose: 123 mg/dL

## 2014-12-27 MED ORDER — METOPROLOL SUCCINATE ER 25 MG PO TB24: 25.0000 mg | ORAL_TABLET | Freq: Every day | ORAL | Status: DC

## 2014-12-27 MED ORDER — ROPINIROLE HCL 0.25 MG PO TABS
0.2500 mg | ORAL_TABLET | Freq: Three times a day (TID) | ORAL | Status: DC
  Administered 2014-12-27 – 2014-12-28 (×3): 0.25 mg via ORAL
  Filled 2014-12-27 (×5): qty 1

## 2014-12-27 MED ORDER — LEVOTHYROXINE SODIUM 50 MCG PO TABS
50.0000 ug | ORAL_TABLET | Freq: Every morning | ORAL | Status: DC
  Administered 2014-12-28: 50 ug via ORAL
  Filled 2014-12-27: qty 1

## 2014-12-27 MED ORDER — POTASSIUM CHLORIDE CRYS ER 20 MEQ PO TBCR
40.0000 meq | EXTENDED_RELEASE_TABLET | Freq: Once | ORAL | Status: AC
  Administered 2014-12-27: 40 meq via ORAL
  Filled 2014-12-27: qty 2

## 2014-12-27 MED ORDER — ENSURE ENLIVE PO LIQD
237.0000 mL | ORAL | Status: DC
  Administered 2014-12-27: 237 mL via ORAL

## 2014-12-27 MED ORDER — PANTOPRAZOLE SODIUM 40 MG PO TBEC
40.0000 mg | DELAYED_RELEASE_TABLET | Freq: Every day | ORAL | Status: DC
  Administered 2014-12-28: 40 mg via ORAL
  Filled 2014-12-27: qty 1

## 2014-12-27 MED ORDER — RESOURCE THICKENUP CLEAR PO POWD
ORAL | Status: DC | PRN
  Filled 2014-12-27: qty 125

## 2014-12-27 MED ORDER — POTASSIUM CHLORIDE 20 MEQ PO PACK: 40.0000 meq | PACK | Freq: Once | ORAL | Status: DC

## 2014-12-27 NOTE — Clinical Social Work Note (Addendum)
CSW Consult Acknowledged:   CSW received a consult for SNF placement. Follow assessment to come.    Addendum: CSW left a voice message for the pt's son regarding SNF placement. CSW is awaiting a call back.     [place], MSW, [place]

## 2014-12-27 NOTE — Progress Notes (Signed)
VASCULAR LAB PRELIMINARY  PRELIMINARY  PRELIMINARY  PRELIMINARY  Carotid duplex  completed.    Preliminary report:  Bilateral:  1-39% ICA stenosis.  Vertebral artery flow is antegrade.      Rui Wordell, RVT 12/27/2014, 1:13 PM

## 2014-12-27 NOTE — Progress Notes (Signed)
  Echocardiogram 2D Echocardiogram has been performed.  Darlina Sicilian M 12/27/2014, 11:50 AM

## 2014-12-27 NOTE — Consult Note (Signed)
Physical Medicine and Rehabilitation Consult  Reason for Consult: Right sided weakness, speech difficulty  Referring Physician: Dr. Sloan Leiter   HPI: Linda [name] is a 81 y.o. female with history of RLS, OA, bilateral salpingo-oophorectomy  11/2014; who was admitted on 12/25/14 am with difficulty speaking and right sided weakness. Last seen normal the night before and outside tPA window. Family reported question of irregular heartbeat pending evaluation.  MRI/MRA brain done revealing Acute nonhemorrhagic left MCA territory infarct involving the insular cortex and operculum and remote right frontal lobe infarct and occlusion of anterior left M2 division likely associated with acute infarct.  Stroke work up underway.  Swallow evaluation done and dysphagia 1, nectar liquids recommended.  Cognitive evaluatio done and patient unable to phonate but able to follow contextual one step commands and answer Y/N questions about basic needs with head nods/shake.    Patient with expressive > receptive aphasia, R-HH with left gaze preference, right sided weakness as well as dysphagia.    Review of Systems  Unable to perform ROS: mental acuity  Gastrointestinal: Positive for abdominal pain (ongoing abdominal pain for past few months. ).  Musculoskeletal:       Unsteady gait  Psychiatric/Behavioral: Positive for memory loss (Was having problems with memory and occasional disorientation per daughter in law. ).    Past Medical History  Diagnosis Date  . History of cardiac catheterization 2002    Negative  . Vertebral fracture   . Restless legs syndrome with nocturnal myoclonus 05/13/2013     [place] controlled   . Abnormal uterine bleeding 8315,1761  . Hypothyroidism   . Arthritis   . Osteoporosis   . History of stomach ulcers   . History of jaundice as a child   . Anemia   . Head pain, chronic     "I have pain in the back of my head for months"  . Dizziness     TAKES MECLIZINE TO TREAT     Past Surgical History  Procedure Laterality Date  . Back surgery      x 3  . Breast surgery Bilateral     1 lump each breast removed and benign  . Nasal sinus surgery    . Dilation and curettage of uterus  1970  . Catheterization  2003    cardiac cath  . Kyphoplasty  2010  . Cataract extraction Bilateral 2011  . Cholecystectomy  1995    Lap  . Cardiac catheterization  2002    "it was normal" per pt - no record found  . Robotic assisted salpingo oopherectomy Bilateral 11/02/2014    Procedure: ROBOTIC ASSISTED bilateral SALPINGO OOPHORECTOMY;  Surgeon: Everitt Amber, MD;  Location: [facility];  Service: Gynecology;  Laterality: Bilateral;  . Laparoscopy N/A 11/02/2014    Procedure: LAPAROSCOPY DIAGNOSTIC;  Surgeon: Everitt Amber, MD;  Location: [facility];  Service: Gynecology;  Laterality: N/A;    Family History  Problem Relation Age of Onset  . Asthma Brother   . Osteoarthritis Son   . Hypertension Father   . Stroke Father   . Cancer Brother     stomach    Social History:  Lives with son.  Independent with cane PTA.  Used to work as a Regulatory affairs officer. Per  reports that she has never smoked. She has never used smokeless tobacco. Per reports that she does not drink alcohol or use illicit drugs.    Allergies  Allergen Reactions  . Ibuprofen Anaphylaxis    Passed  out and had to be hospitalized  . Penicillins     Skin on head was crawling and very very sleepy  . Demerol [Meperidine Hcl] Nausea And Vomiting  . Dilaudid [Hydromorphone Hcl] Nausea And Vomiting  . Lyrica [Pregabalin]     "made me a zombie'  . Morphine And Related Nausea And Vomiting  . Percocet [Oxycodone-Acetaminophen] Nausea And Vomiting  . Vicodin [Hydrocodone-Acetaminophen] Nausea And Vomiting    Medications Prior to Admission  Medication Sig Dispense Refill  . acetaminophen (TYLENOL) 500 MG tablet Take 1,000 mg by mouth every 6 (six) hours as needed. Usually only takes 2 x a  day    . aspirin EC 81 MG tablet Take 162  mg by mouth at bedtime.     . calcium gluconate 500 MG tablet Take 500 mg by mouth 2 (two) times daily. With 1000 units D3    . cholecalciferol (VITAMIN D) 1000 UNITS tablet Take 1,000 Units by mouth 2 (two) times daily. Take with calium    . Cranberry 200 MG CAPS Take 1 capsule by mouth daily with lunch.     . denosumab (PROLIA) 60 MG/ML SOLN injection Inject 60 mg into the skin every 6 (six) months. Administer in upper arm, thigh, or abdomen    . Flaxseed, Linseed, (FLAX SEED OIL) 1000 MG CAPS Take 1 capsule by mouth 2 (two) times daily.     Marland Kitchen ipratropium (ATROVENT) 0.06 % nasal spray Place 2 sprays into both nostrils 2 (two) times daily as needed for rhinitis.     Marland Kitchen levothyroxine (SYNTHROID, LEVOTHROID) 50 MCG tablet Take 50 mcg by mouth every morning.     . meclizine (ANTIVERT) 25 MG tablet Take 25 mg by mouth daily as needed for dizziness.     . metoprolol succinate (TOPROL-XL) 25 MG 24 hr tablet Take 25 mg by mouth daily.    . Multiple Vitamins-Minerals (CENTRUM SILVER PO) Take 1 tablet by mouth daily with lunch.     . ondansetron (ZOFRAN) 4 MG tablet Take 1 tablet (4 mg total) by mouth every 8 (eight) hours as needed for nausea or vomiting. 20 tablet 0  . pantoprazole (PROTONIX) 40 MG tablet Take 40 mg by mouth daily.    Marland Kitchen Propylene Glycol (SYSTANE BALANCE OP) Apply 1 drop to eye 2 (two) times daily.    Marland Kitchen rOPINIRole (REQUIP) 0.25 MG tablet TAKE 1 TABLET BY MOUTH 3 TIMES A DAY 270 tablet 2  . traMADol (ULTRAM) 50 MG tablet Take 1 tablet (50 mg total) by mouth every 6 (six) hours as needed. 30 tablet 0    Home: Home Living Family/patient expects to be discharged to:: Private residence Living Arrangements: Children Available Help at Discharge: Family, Available PRN/intermittently Type of Home: House Home Access: Stairs to enter Technical brewer of Steps: 4 Entrance Stairs-Rails: Left Home Layout: Two level, Laundry or work area in basement, Able to live on main level with  bedroom/bathroom Home Equipment: [place] - single point  Functional History: Prior Function Level of Independence: Independent with assistive device(s) Comments: ambulates with cane; still driving short distances Functional Status:  Mobility: Bed Mobility Overal bed mobility: Needs Assistance Bed Mobility: Rolling, Sidelying to Sit Rolling: Mod assist, Max assist Sidelying to sit: Mod assist General bed mobility comments: Rolled to Right side with heavy mod assist and lots of cues to reach for rail; max assis to roll L; noted she did push with LUE through elbow and hand to go sidelie to sit; heavy mod assist to  acheive fully upright sitting Transfers Overall transfer level: Needs assistance Equipment used: 1 person hand held assist Transfers: Sit to/from Stand, Stand Pivot Transfers Sit to Stand: Mod assist Stand pivot transfers: Max assist General transfer comment: Light mod assist for sit to stand, with noted initating with anterior weight shift and push through bil LEs; dec cotrol with dymanic stepping, and she required knee block with transitionto chair      ADL:    Cognition: Cognition Overall Cognitive Status: Impaired/Different from baseline Arousal/Alertness: Lethargic Orientation Level: Other (comment) (UTA, pt does not speak) Cognition Arousal/Alertness: Lethargic (Eyes closed at least 75% of session) Behavior During Therapy:  (VERY sleepy) Overall Cognitive Status: Impaired/Different from baseline Area of Impairment: Attention, Following commands Current Attention Level:  (Noted decr attention to right side) Following Commands: Follows one step commands with increased time ("open your eyes" "squeeze my hands" "look at your son") General Comments: Difficult to conduct a thorough assessmnent of cognition with decr arousal; Still, pt followed simple commands including organizing trunk with anterior weight shift and pushing through feet when asked to stand  Blood pressure  116/66, pulse 96, temperature 98.1 F (36.7 C), temperature source Oral, resp. rate 17, last menstrual period 09/03/1996, SpO2 100 %. Physical Exam  Nursing note and vitals reviewed. Constitutional: She appears well-developed.  Thin elderly female with flat affect.   HENT:  Head: Normocephalic and atraumatic.  Eyes: Conjunctivae are normal. Pupils are equal, round, and reactive to light.  Neck: Normal range of motion. Neck supple.  Cardiovascular: Normal rate.   Respiratory: Effort normal and breath sounds normal.  GI: Soft. Bowel sounds are normal.  Musculoskeletal: She exhibits no edema or tenderness.  Neurological: She is alert.  Aphonic.  Right facial weakness with left gaze preference but able to turn eyes to right field with cures. R-HH noted.  Able to follow simple motoric commands with cues but question motor apraxia. She was unable to recognize her name with choice of two.  Right sided weakness with emerging tone.   Skin: Skin is warm and dry.    Results for orders placed or performed during the hospital encounter of 12/25/14 (from the past 24 hour(s))  Basic metabolic panel     Status: Abnormal   Collection Time: 12/27/14  9:43 AM  Result Value Ref Range   Sodium 138 135 - 145 mmol/L   Potassium 3.3 (L) 3.5 - 5.1 mmol/L   Chloride 109 96 - 112 mmol/L   CO2 22 19 - 32 mmol/L   Glucose, Bld 121 (H) 70 - 99 mg/dL   BUN 16 6 - 23 mg/dL   Creatinine, Ser 0.86 0.50 - 1.10 mg/dL   Calcium 7.7 (L) 8.4 - 10.5 mg/dL   GFR calc non Af Amer 60 (L) >90 mL/min   GFR calc Af Amer 70 (L) >90 mL/min   Anion gap 7 5 - 15   Dg Chest 2 View  12/25/2014   CLINICAL DATA:  Stroke  EXAM: CHEST  2 VIEW  COMPARISON:  05/27/2009  FINDINGS: Bibasilar atelectasis and effusion. Vascular congestion with probable fluid overload. No significant edema.  Vertebroplasty in the mid thoracic spine two levels. Lungs are hyperinflated.  IMPRESSION: Findings consistent with mild fluid overload with vascular  congestion and small bilateral effusions and bibasilar atelectasis.   Electronically Signed   By: Franchot Gallo M.D.   On: 12/25/2014 14:13   Mr Brain Wo Contrast  12/25/2014   CLINICAL DATA:  Aphasia. Stroke. Right-sided weakness. Abnormal CT  of the head  EXAM: MRI HEAD WITHOUT CONTRAST  MRA HEAD WITHOUT CONTRAST  TECHNIQUE: Multiplanar, multiecho pulse sequences of the brain and surrounding structures were obtained without intravenous contrast. Angiographic images of the head were obtained using MRA technique without contrast.  COMPARISON:  CT head from the same day.  FINDINGS: MRI HEAD FINDINGS  A left MCA territory infarct is confirmed. This involves the insular cortex and operculum. DWI signal in the anterior right frontal lobe represents T2 shine through from a remote or subacute infarct.  T2 changes are associated with remote anterior right frontal lobe infarct. There is minimal subtle change within the acute left MCA territory infarct. Moderate generalized atrophy is present. The ventricles are proportionate to the degree of atrophy.  Flow is present in the major intracranial arteries. Bilateral lens replacements are present.  Mild mucosal thickening is scattered throughout the ethmoid air cells. The mastoid air cells are clear. The remaining paranasal sinuses are clear. The skullbase is unremarkable. Midline structures are within normal limits.  MRA HEAD FINDINGS  The MRA is degraded by patient motion. Internal carotid arteries are within normal limits from the high cervical segments through the ICA termini. The A1 and M1 segments are normal. The anterior communicating artery is patent. The MCA bifurcations are intact. The anterior left M2 segment is occluded approximately 10 mm from its origin. There is asymmetric attenuation of left MCA branch vessels compared to the right. The ACA branch vessels are within normal limits.  The left vertebral artery is dominant. The right vertebral artery terminates at  the PICA. The left PICA is not visualized. A left AICA is present. The left PCA originates from the basilar tip. The right PCA is of fetal type.  IMPRESSION: 1. Acute nonhemorrhagic left MCA territory infarct involving the insular cortex and operculum. 2. Remote anterior left right frontal lobe infarct. 3. Moderate generalized atrophy. 4. The MRA demonstrates occlusion of the anterior left M2 division, likely associated with the acute infarct. 5. Mild distal small vessel disease. 6. No other significant proximal stenosis, aneurysm, or branch vessel occlusion.   Electronically Signed   By: San Morelle M.D.   On: 12/25/2014 14:21   Mr Jodene Nam Head/brain Wo Cm  12/25/2014   CLINICAL DATA:  Aphasia. Stroke. Right-sided weakness. Abnormal CT of the head  EXAM: MRI HEAD WITHOUT CONTRAST  MRA HEAD WITHOUT CONTRAST  TECHNIQUE: Multiplanar, multiecho pulse sequences of the brain and surrounding structures were obtained without intravenous contrast. Angiographic images of the head were obtained using MRA technique without contrast.  COMPARISON:  CT head from the same day.  FINDINGS: MRI HEAD FINDINGS  A left MCA territory infarct is confirmed. This involves the insular cortex and operculum. DWI signal in the anterior right frontal lobe represents T2 shine through from a remote or subacute infarct.  T2 changes are associated with remote anterior right frontal lobe infarct. There is minimal subtle change within the acute left MCA territory infarct. Moderate generalized atrophy is present. The ventricles are proportionate to the degree of atrophy.  Flow is present in the major intracranial arteries. Bilateral lens replacements are present.  Mild mucosal thickening is scattered throughout the ethmoid air cells. The mastoid air cells are clear. The remaining paranasal sinuses are clear. The skullbase is unremarkable. Midline structures are within normal limits.  MRA HEAD FINDINGS  The MRA is degraded by patient motion.  Internal carotid arteries are within normal limits from the high cervical segments through the ICA termini. The A1  and M1 segments are normal. The anterior communicating artery is patent. The MCA bifurcations are intact. The anterior left M2 segment is occluded approximately 10 mm from its origin. There is asymmetric attenuation of left MCA branch vessels compared to the right. The ACA branch vessels are within normal limits.  The left vertebral artery is dominant. The right vertebral artery terminates at the PICA. The left PICA is not visualized. A left AICA is present. The left PCA originates from the basilar tip. The right PCA is of fetal type.  IMPRESSION: 1. Acute nonhemorrhagic left MCA territory infarct involving the insular cortex and operculum. 2. Remote anterior left right frontal lobe infarct. 3. Moderate generalized atrophy. 4. The MRA demonstrates occlusion of the anterior left M2 division, likely associated with the acute infarct. 5. Mild distal small vessel disease. 6. No other significant proximal stenosis, aneurysm, or branch vessel occlusion.   Electronically Signed   By: San Morelle M.D.   On: 12/25/2014 14:21    Assessment/Plan: Diagnosis: left MCA infarct 1. Does the need for close, 24 hr/day medical supervision in concert with the patient's rehab needs make it unreasonable for this patient to be served in a less intensive setting? Yes 2. Co-Morbidities requiring supervision/potential complications: htn, dysphagia 3. Due to bladder management, bowel management, safety, skin/wound care, disease management, medication administration, pain management and patient education, does the patient require 24 hr/day rehab nursing? Yes 4. Does the patient require coordinated care of a physician, rehab nurse, PT (1-2 hrs/day, 5 days/week), OT (1-2 hrs/day, 5 days/week) and SLP (1-2 hrs/day, 5 days/week) to address physical and functional deficits in the context of the above medical  diagnosis(es)? Yes Addressing deficits in the following areas: balance, endurance, locomotion, strength, transferring, bowel/bladder control, bathing, dressing, feeding, grooming, toileting, cognition, speech, language and swallowing 5. Can the patient actively participate in an intensive therapy program of at least 3 hrs of therapy per day at least 5 days per week? Yes 6. The potential for patient to make measurable gains while on inpatient rehab is excellent 7. Anticipated functional outcomes upon discharge from inpatient rehab are supervision and min assist  with PT, supervision and min assist with OT, supervision and min assist with SLP. 8. Estimated rehab length of stay to reach the above functional goals is: 20-25 days 9. Does the patient have adequate social supports and living environment to accommodate these discharge functional goals? Yes and Potentially 10. Anticipated D/C setting: Home 11. Anticipated post D/C treatments: HH therapy and Outpatient therapy 12. Overall Rehab/Functional Prognosis: excellent  RECOMMENDATIONS: This patient's condition is appropriate for continued rehabilitative care in the following setting: CIR Patient has agreed to participate in recommended program. Yes Note that insurance prior authorization may be required for reimbursement for recommended care.  Comment: Rehab Admissions Coordinator to follow up.  Thanks,  Meredith Staggers, MD, Mellody Drown     12/27/2014

## 2014-12-27 NOTE — Progress Notes (Signed)
Physical Therapy Treatment Patient Details Name: Linda [name] MRN: [id number] DOB: 09-29-1928 Today's Date: 12/27/2014    History of Present Illness Linda [name] is an 81 y.o. female who presents with difficulty communicating and right-sided weakness. CT shows early subacute left MCA territory infarct; MRI -Acute nonhemorrhagic left MCA territory infarct involving the insular cortex and operculum. 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     PT Comments    Patient progressing well with mobility. Tolerated short distance ambulation today with assist of 2 due to decreased foot clearance RLE and balance deficits. Seems motivated to get out of bed. Nods appropriately to simple questions but no verbalizations noted during session. Able to functionally utilize RUE during transfers and gait. Appropriate for CIR. Will continue to follow and progress as tolerated.   Follow Up Recommendations  CIR     Equipment Recommendations  Other (comment) (TBD)    Recommendations for Other Services Other (comment)     Precautions / Restrictions Precautions Precautions: Fall Restrictions Weight Bearing Restrictions: No    Mobility  Bed Mobility Overal bed mobility: Needs Assistance Bed Mobility: Supine to Sit     Supine to sit: Mod assist;HOB elevated     General bed mobility comments: Able to bring BLEs to EOB with max cues; required assist scooting bottom to EOB with constant stimulus to stay attended to task.  Transfers Overall transfer level: Needs assistance Equipment used: 2 person hand held assist Transfers: Sit to/from Stand Sit to Stand: +2 physical assistance;Min assist         General transfer comment: Min A of 2 to stand from EOB with cues for anterior translation. No knee buckling noted.   Ambulation/Gait Ambulation/Gait assistance: +2 physical assistance;Mod assist Ambulation Distance (Feet):  10 Feet Assistive device: 2 person hand held assist Gait Pattern/deviations: Step-through pattern;Shuffle;Leaning posteriorly;Decreased dorsiflexion - right   Gait velocity interpretation: Below normal speed for age/gender General Gait Details: Short shuffling steps with decreased foot clearance RLE. Posterior trunk lean noted initially. Cues for hip extension for upright posture and VC to advance RLE.   Stairs            Wheelchair Mobility    Modified Rankin (Stroke Patients Only) Modified Rankin (Stroke Patients Only) Pre-Morbid Rankin Score: No symptoms Modified Rankin: Moderately severe disability     Balance Overall balance assessment: Needs assistance Sitting-balance support: Feet supported;No upper extremity supported Sitting balance-Leahy Scale: Fair     Standing balance support: During functional activity Standing balance-Leahy Scale: Poor                      Cognition Arousal/Alertness: Awake/alert Behavior During Therapy: WFL for tasks assessed/performed Overall Cognitive Status: Impaired/Different from baseline Area of Impairment: Following commands;Attention   Current Attention Level: Focused (Decreased attention tor ight side)   Following Commands: Follows one step commands with increased time (and visual cues.)            Exercises      General Comments General comments (skin integrity, edema, etc.): Family stepped out of room for session (to limit distractions and noise).      Pertinent Vitals/Pain Pain Assessment: Faces Faces Pain Scale: No hurt Pain Location: neck Pain Intervention(s): Repositioned    Home Living                      Prior Function            PT  Goals (current goals can now be found in the care plan section) Progress towards PT goals: Progressing toward goals    Frequency  Min 4X/week    PT Plan Current plan remains appropriate    Co-evaluation             End of Session Equipment  Utilized During Treatment: Gait belt Activity Tolerance: Patient tolerated treatment well Patient left: in chair;with call bell/phone within reach;with chair alarm set;Other (comment) (pastor in room.)     Time: 1005-1025 PT Time Calculation (min) (ACUTE ONLY): 20 min  Charges:  $Gait Training: 8-22 mins                    G CodesCandy Sledge A 2015-01-16, 11:48 AM Candy Sledge, PT, DPT [phone]

## 2014-12-27 NOTE — Progress Notes (Signed)
STROKE TEAM PROGRESS NOTE   HISTORY Linda [name] is an 81 y.o. female unremarkable past medical history presenting after waking up with right sided weakness and difficulty speaking. Patient went to bed at 2100 12/24/2014 in normal state of health. Upon waking noted to be weak on right sided and unable to communicate. No prior CVA or TIA history. No known history of A fib.  Of note her sons state that she was told by a doctor that she may have an "irregular heartbeat". Further testing was pending on this. CT head imaging reviewed shows likely L MCA territory infarct. tPA was not given as outside tPA window. She was admitted for further evaluation and treatment   SUBJECTIVE (INTERVAL HISTORY) Her family is at the bedside. afib was noted on tele, and pt has recent cardiac monitoring with Dr. Oran Rein showed afib.    OBJECTIVE Temp:  [98.1 F (36.7 C)-98.9 F (37.2 C)] 98.1 F (36.7 C) (04/25 1036) Pulse Rate:  [95-107] 96 (04/25 1036) Cardiac Rhythm:  [-] Atrial fibrillation (04/24 1453) Resp:  [16-18] 17 (04/25 1036) BP: (116-129)/(66-79) 116/66 mmHg (04/25 1036) SpO2:  [97 %-100 %] 100 % (04/25 1036)  No results for input(s): GLUCAP in the last 168 hours.  Recent Labs Lab 12/25/14 0747 12/25/14 0801 12/27/14 0943  NA 140 142 138  K 4.3 4.3 3.3*  CL 107 105 109  CO2 26  --  22  GLUCOSE 111* 104* 121*  BUN 16 18 16   CREATININE 1.20* 1.10 0.86  CALCIUM 9.5  --  7.7*    Recent Labs Lab 12/25/14 0747  AST 31  ALT 27  ALKPHOS 111  BILITOT 0.6  PROT 6.9  ALBUMIN 3.4*    Recent Labs Lab 12/25/14 0747 12/25/14 0801  WBC 8.1  --   NEUTROABS 5.6  --   HGB 13.1 14.6  HCT 40.3 43.0  MCV 96.4  --   PLT 323  --    No results for input(s): CKTOTAL, CKMB, CKMBINDEX, TROPONINI in the last 168 hours.  Recent Labs  12/25/14 0747  LABPROT 14.7  INR 1.13    Recent Labs  12/25/14 0915  COLORURINE YELLOW  LABSPEC 1.013  PHURINE 6.5  GLUCOSEU NEGATIVE  HGBUR NEGATIVE   BILIRUBINUR NEGATIVE  KETONESUR NEGATIVE  PROTEINUR NEGATIVE  UROBILINOGEN 0.2  NITRITE NEGATIVE  LEUKOCYTESUR NEGATIVE       Component Value Date/Time   CHOL 113 12/26/2014 0732   TRIG 58 12/26/2014 0732   HDL 49 12/26/2014 0732   CHOLHDL 2.3 12/26/2014 0732   VLDL 12 12/26/2014 0732   LDLCALC 52 12/26/2014 0732   Lab Results  Component Value Date   HGBA1C 5.9* 12/26/2014      Component Value Date/Time   LABOPIA NONE DETECTED 12/25/2014 0915   COCAINSCRNUR NONE DETECTED 12/25/2014 0915   LABBENZ NONE DETECTED 12/25/2014 0915   AMPHETMU NONE DETECTED 12/25/2014 0915   THCU NONE DETECTED 12/25/2014 0915   LABBARB NONE DETECTED 12/25/2014 0915     Recent Labs Lab 12/25/14 0747  ETH <5   I have personally reviewed the radiological images below and agree with the radiology interpretations.  Dg Chest 2 View 12/25/2014    Findings consistent with mild fluid overload with vascular congestion and small bilateral effusions and bibasilar atelectasis.     Ct Head Wo Contrast 12/25/2014    1. Recent, likely early subacute, left middle cerebral artery territory infarct involving the posterior left frontal lobe and left temporal lobe.  2. No  other acute or recent abnormality. No other evidence of an infarct.  3. No intracranial hemorrhage.     MRI Brain 12/25/2014    1. Acute nonhemorrhagic left MCA territory infarct involving the insular cortex and operculum.  2. Remote anterior left right frontal lobe infarct.  3. Moderate generalized atrophy.   MRA Brain 12/25/2014 4. The MRA demonstrates occlusion of the anterior left M2 division, likely associated with the acute infarct.  5. Mild distal small vessel disease.  6. No other significant proximal stenosis, aneurysm, or branch vessel occlusion.     CUS - Bilateral: 1-39% ICA stenosis. Vertebral artery flow is antegrade.  2D echo - - Left ventricle: The cavity size was normal. There was mild focal basal hypertrophy of  the septum. Systolic function was normal. The estimated ejection fraction was in the range of 55% to 60%. Wall motion was normal; there were no regional wall motion abnormalities.  - Mitral valve: Calcified annulus.  Impressions:  - Normal LV function 55-60%; trace MR and mild TR.   PHYSICAL EXAM Exam: Gen: NAD Eyes: anicteric sclerae, moist conjunctivae                    CV: irregular, no MRG, no carotid bruits, no peripheral edema Mental Status: Alert, does not follow commands, no eye contact Speech:   Global aphasia Cranial Nerves:    The pupils are equal, round, and reactive to light.. Attempted, Fundi not visualized.  EOMI. No gaze preference. right hemianopia. Left gaze preference. Face symmetric, Tongue midline. Nonverbal, cannot assess voice. Intact gag and SCM.  Motor Observation:    no involuntary movements noted. Tone appears normal.  Strength:   Right hemiplegia and decreased tone of right arm > right leg. No spontaneous movements of right side.  Left side antigravity.   Sensation:  Withdraws left.  Plantars equivocal..    ASSESSMENT/PLAN  Ms. Linda [name] is a 81 y.o. female with an unremarkable past medical history except for irregular heartbeats  presenting with right-sided weakness and difficulty speaking. She did not receive IV t-PA due to late presentation.  Stroke:  Dominant - left MCA territory infarct - probably embolic secondary to atrial fibrillation.  Resultant right hemiparesis, aphasia  MRI   Acute nonhemorrhagic left MCA territory infarct involving the insular cortex and operculum.   MRA  Occlusion of the anterior left M2 division, likely associated with the acute infarct.   Carotid Doppler - unremarkable   2D Echo - unremarkable   LDL 52  HgbA1c 5.9  Subcutaneous heparin for VTE prophylaxis DIET - DYS 1 Room service appropriate?: Yes; Fluid consistency:: Nectar Thick  aspirin 81 mg orally every day prior to admission, now on  aspirin 325 mg daily. Needs discussion with son regarding Eliquis in 5-7 days.   Ongoing aggressive stroke risk factor management  Therapy recommendations: CIR recommended - consult pending.  Disposition:  pending   Atrial Fibrillation/ RVR  Paroxysmal atrial fibrillation noted on telemetry   Currently not on tele, in RVR  Per family, recent OP tele workup by Dr. Wynonia Lawman revealed atrial fibrillation   From stroke perspective, given large size of stroke, recommend Anticoagulation in 5-7 days   Hypertension  Home meds: Toprol-XL  Stable  Hyperlipidemia  Home meds: No lipid lowering medications prior to admission.  LDL 52, goal < 70  Other Stroke Risk Factors  Advanced age  Family hx stroke (Father)  Other [facility] day # 2  Neurology  will sign off. Please call with questions. Pt will follow up with Dr. Erlinda Hong at [facility] in about 2 months. Thanks for the consult.   Rosalin Hawking, MD PhD Stroke Neurology 12/27/2014 11:57 PM  To contact Stroke Continuity provider, please refer to [email or web address] After hours, contact General Neurology

## 2014-12-27 NOTE — Progress Notes (Addendum)
Speech Language Pathology Treatment: Dysphagia;Cognitive-Linquistic  Patient Details Name: Linda [name] MRN: [id number] DOB: [birth year] Today's Date: 12/27/2014 Time: 8676-7209 SLP Time Calculation (min) (ACUTE ONLY): 30 min  Assessment / Plan / Recommendation Clinical Impression  Pt with significant improvement in arousal today, fully alert and participatory, able to sustain attention to therapy activities with min cues. SLP provided repositioning and hand over hand cues to facilitate self feeding and maximize awareness of bolus. Able to fade to supervision assist with pt self feeding 100% of nectar thick liquids via cup. Pt does demonstrate a neuromuscular dysphagia with mild anterior spillage and oral residue, delayed swallow and occasional second swallow. Appearance of sensed aspiration events with thin liquids, though nectar sips and puree tolerated very well with no coughing observed. Recommend pt initiate a Puree (dys 1) diet with nectar thick liquids and full supervision. Pt will need to be fully upright for meals, to chair preferably. SLP will follow for tolerance and objective testing as needed.   Aphasia diagnostic treamtent also provided now that pt fully alert. Pt is able to respond via head nod/shake to social questions and follow 75% of one step commands with contextual cues, though accuracy with basic biographical questions and object identification is chance level - 50%. Attempted to facilitate verbalization with automatic opportunities, sequential counting, singing and MIT techniques. Pt was engaged but did not phonate during attempts. Ongoing severe expressive aphasia and moderate to severe receptive aphasia despite improved arousal.    HPI HPI: Linda [name] is an 81 y.o. female who presents with difficulty communicating and right-sided weakness. CT shows early subacute left MCA territory infarct; MRI  acute nonhemorrhagic left MCA territory infarct involving the insular cortex  and operculum. 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   Pertinent Vitals Pain Assessment: Faces Faces Pain Scale: Hurts little more Pain Location: neck Pain Intervention(s): Repositioned  SLP Plan  Continue with current plan of care    Recommendations Diet recommendations: Dysphagia 1 (puree);Nectar-thick liquid Liquids provided via: Cup Medication Administration: Crushed with puree Supervision: Staff to assist with self feeding;Full supervision/cueing for compensatory strategies Compensations: Slow rate;Small sips/bites;Check for pocketing Postural Changes and/or Swallow Maneuvers: Seated upright 90 degrees;Out of bed for meals              General recommendations: Rehab consult Oral Care Recommendations: Oral care BID Follow up Recommendations: Inpatient Rehab Plan: Continue with current plan of care    GO    [facility], MA CCC-SLP [phone]  Lynann Beaver 12/27/2014, 10:19 AM

## 2014-12-27 NOTE — Progress Notes (Signed)
Rehab admissions - Evaluated for possible admission.  I met with patient and her family.  Patient can nod head yes and no, but unable to speak to me.  I explained inpatient rehab and SNF to patient/family.  I gave a son rehab brochures.  Family will talk together and talk to patient.  Then they will call tonight or in the am to let me know their decision regarding CIR or SNF.  Call me for questions.  #[phone]

## 2014-12-27 NOTE — Progress Notes (Signed)
INITIAL NUTRITION ASSESSMENT  DOCUMENTATION CODES Per approved criteria  -Severe malnutrition in the context of chronic illness -Underweight   INTERVENTION: Magic cup TID with meals, each supplement provides 290 kcal and 9 grams of protein  Ensure Enlive po daily, each supplement provides 350 kcal and 20 grams of protein   NUTRITION DIAGNOSIS: Malnutrition related to chronic illness as evidenced by severe fat and muscle depletion.   Goal: Pt to meet >/= 90% of their estimated nutrition needs   Monitor:  PO intake, supplement acceptance, weight trends, labs  Reason for Assessment: Low Braden  ASSESSMENT: Pt admitted with acute CVA, pt is aphasic but was able to advance diet today per SLP. Pt currently on Dysphagia 1 with Nectar thickened liquids.   Daughter-in-law at bedside and reports that her weight seems stable. Pt lives with oldest son and therefore family is unsure how much she has been eating but feels it is very little.  Lunch at bedside pt had ate her peas but only a bite of other items on the tray. Per daughter-in-law pt only able to tolerate very small bites.  Pt having carotid duplex so unable to complete exam in its entirety.   Nutrition Focused Physical Exam:  Subcutaneous Fat:  Orbital Region: severe depeltion Upper Arm Region: unable to assess Thoracic and Lumbar Region: unable to assess  Muscle:  Temple Region: severe depletion Clavicle Bone Region: unable to assess Clavicle and Acromion Bone Region: unable to assess Scapular Bone Region: unable to assess Dorsal Hand: severe depletion Patellar Region: unable to assess  Anterior Thigh Region: unable to assess Posterior Calf Region: unable to assess  Edema: none present per nursing documentation    Height: Ht Readings from Last 1 Encounters:  11/26/14 5' 3.23" (1.606 m)    Weight: Wt Readings from Last 1 Encounters:  11/26/14 99 lb 11.2 oz (45.224 kg)    Ideal Body Weight: 52.2 kg   % Ideal  Body Weight: 87%  Wt Readings from Last 10 Encounters:  11/26/14 99 lb 11.2 oz (45.224 kg)  11/02/14 105 lb (47.628 kg)  10/15/14 104 lb 11.2 oz (47.492 kg)  10/07/14 103 lb (46.72 kg)  09/22/14 103 lb (46.72 kg)  06/01/14 102 lb 3.2 oz (46.358 kg)  09/16/13 104 lb (47.174 kg)  05/13/13 101 lb 8 oz (46.04 kg)  05/12/13 103 lb (46.72 kg)  05/09/12 100 lb (45.36 kg)    Usual Body Weight: 93-103 lb   % Usual Body Weight: 100%  BMI:  There is no weight on file to calculate BMI.  Estimated Nutritional Needs: Kcal: 1400-1600 Protein: 70-80 grams Fluid: > 1.5 L/day  Skin: WDL  Diet Order: DIET - DYS 1 Room service appropriate?: Yes; Fluid consistency:: Nectar Thick  EDUCATION NEEDS: -No education needs identified at this time   Intake/Output Summary (Last 24 hours) at 12/27/14 1126 Last data filed at 12/26/14 2300  Gross per 24 hour  Intake      0 ml  Output    600 ml  Net   -600 ml    Last BM: 4/24   Labs:   Recent Labs Lab 12/25/14 0747 12/25/14 0801 12/27/14 0943  NA 140 142 138  K 4.3 4.3 3.3*  CL 107 105 109  CO2 26  --  22  BUN 16 18 16   CREATININE 1.20* 1.10 0.86  CALCIUM 9.5  --  7.7*  GLUCOSE 111* 104* 121*    CBG (last 3)  No results for input(s): GLUCAP in the  last 72 hours.  Scheduled Meds: .  stroke: mapping our early stages of recovery book   Does not apply Once  . antiseptic oral rinse  7 mL Mouth Rinse BID  . aspirin  300 mg Rectal Daily   Or  . aspirin  325 mg Oral Daily  . heparin  5,000 Units Subcutaneous 3 times per day  . levothyroxine  25 mcg Intravenous Daily  . pantoprazole (PROTONIX) IV  40 mg Intravenous Q24H    Continuous Infusions: . dextrose 5 % and 0.9% NaCl 100 mL/hr at 12/27/14 [place], [place], [place] Pager [phone] After Hours Pager

## 2014-12-27 NOTE — Progress Notes (Signed)
Rehab Admissions Coordinator Note:  Patient was screened by Cleatrice Burke for appropriateness for an Inpatient Acute Rehab Consult per PT recommendation . At this time, we are recommending Inpatient Rehab consult. I will contact MD for order.  Cleatrice Burke 12/27/2014, 8:45 AM  I can be reached at [phone].

## 2014-12-27 NOTE — Progress Notes (Addendum)
PATIENT DETAILS Name: Linda [name] Age: 81 y.o. Sex: female Date of Birth: 09-02-1929 Admit Date: 12/25/2014 Admitting Physician Orson Eva, MD JJO:ACZYS,AYTKZS DAVIDSON, MD  Subjective: Still aphasic. Follows commands. Seems to have more strength/movement on the right side.  Assessment/Plan: Active Problems:   Acute CVA (cerebral vascular accident): Admitted with aphasia and right-sided weakness. CT head negative for acute changes, MRI brain (images personally reviewed) shows acute infarct involving the insular cortex and operculum. Echocardiogram shows EF around 01-09%, with no embolic source. Carotid Doppler negative for significant stenosis. LDL at goal-52. A1c 5.9.Telemetry shows atrial fibrillation, spoke with the stroke team-Dr.Xu who recommended starting anticoagulation around 5-7 days. Right-sided deficits have improved, started on a dysphagia 1 diet today. For now continue with aspirin, when deemed safe, we will start anticoagulation with Eliquis and discontinue aspirin. Await CIR evaluation.    Atrial fibrillation: Rate controlled (personally reviewed telemetry)-currently are not any rate controlling meds.Chads2vasc 6, per recommendations of neurology, will start anticoagulation in 5-7 days    Essential hypertension: All antihypertensives remain on hold-allow permissive hypertension    Dysphagia: Likely secondary to CVA. Initially kept nothing by mouth and was unable to participate in swallow evaluation, reevaluated by simply on 4/25, started on a dysphagia 1 diet. Spoke with granddaughter at bedside, family aware of risk of aspiration.     Hypothyroidism: Continue levothyroxine-change to oral     GERD: Continue PPI    Restless leg syndrome: Continue Requip  Disposition: Remain inpatient-CIR vs SNF on discharge  Antimicrobial agents  See below  Anti-infectives    None      DVT Prophylaxis: Prophylactic Heparin  Code Status: Full code    Family Communication Grand-daughter at bedside  Procedures: None  CONSULTS:  neurology  Time spent 40 minutes-which includes 50% of the time with face-to-face with patient/ family and coordinating care with Neurology- related to the above assessment and plan.  MEDICATIONS: Scheduled Meds: .  stroke: mapping our early stages of recovery book   Does not apply Once  . antiseptic oral rinse  7 mL Mouth Rinse BID  . aspirin  300 mg Rectal Daily   Or  . aspirin  325 mg Oral Daily  . heparin  5,000 Units Subcutaneous 3 times per day  . levothyroxine  50 mcg Oral q morning - 10a  . pantoprazole  40 mg Oral Daily  . rOPINIRole  0.25 mg Oral TID   Continuous Infusions:  PRN Meds:.acetaminophen **OR** acetaminophen, ipratropium, RESOURCE THICKENUP CLEAR, senna-docusate    PHYSICAL EXAM: Vital signs in last 24 hours: Filed Vitals:   12/26/14 1742 12/26/14 2150 12/27/14 0232 12/27/14 1036  BP: 122/79 127/73 122/77 116/66  Pulse: 107 95 98 96  Temp: 98.9 F (37.2 C) 98.3 F (36.8 C) 98.1 F (36.7 C) 98.1 F (36.7 C)  TempSrc: Oral Axillary Oral Oral  Resp: 18 16 16 17   SpO2: 98% 100% 97% 100%    Weight change:  There were no vitals filed for this visit. There is no weight on file to calculate BMI.   Gen Exam: Awake, aphasic. Follows more commands   Neck: Supple, No JVD.   Chest: B/L Clear.   CVS: S1 S2 iregular, no murmurs.  Abdomen: soft, BS +, non tender, non distended.  Extremities: no edema, lower extremities warm to touch. Neurologic: Right upper ext 3/5,  Right lower 3/5 Skin: No Rash.   Wounds: N/A.  Intake/Output from previous day:  Intake/Output Summary (Last 24 hours) at 12/27/14 1331 Last data filed at 12/26/14 2300  Gross per 24 hour  Intake      0 ml  Output    600 ml  Net   -600 ml     LAB RESULTS: CBC  Recent Labs Lab 12/25/14 0747 12/25/14 0801  WBC 8.1  --   HGB 13.1 14.6  HCT 40.3 43.0  PLT 323  --   MCV 96.4  --   MCH 31.3   --   MCHC 32.5  --   RDW 12.8  --   LYMPHSABS 1.6  --   MONOABS 0.6  --   EOSABS 0.3  --   BASOSABS 0.1  --     Chemistries   Recent Labs Lab 12/25/14 0747 12/25/14 0801 12/27/14 0943  NA 140 142 138  K 4.3 4.3 3.3*  CL 107 105 109  CO2 26  --  22  GLUCOSE 111* 104* 121*  BUN 16 18 16   CREATININE 1.20* 1.10 0.86  CALCIUM 9.5  --  7.7*    CBG: No results for input(s): GLUCAP in the last 168 hours.  GFR CrCl cannot be calculated (Unknown ideal weight.).  Coagulation profile  Recent Labs Lab 12/25/14 0747  INR 1.13    Cardiac Enzymes No results for input(s): CKMB, TROPONINI, MYOGLOBIN in the last 168 hours.  Invalid input(s): CK  Invalid input(s): POCBNP No results for input(s): DDIMER in the last 72 hours.  Recent Labs  12/26/14 0732  HGBA1C 5.9*    Recent Labs  12/26/14 0732  CHOL 113  HDL 49  LDLCALC 52  TRIG 58  CHOLHDL 2.3   No results for input(s): TSH, T4TOTAL, T3FREE, THYROIDAB in the last 72 hours.  Invalid input(s): FREET3 No results for input(s): VITAMINB12, FOLATE, FERRITIN, TIBC, IRON, RETICCTPCT in the last 72 hours. No results for input(s): LIPASE, AMYLASE in the last 72 hours.  Urine Studies No results for input(s): UHGB, CRYS in the last 72 hours.  Invalid input(s): UACOL, UAPR, USPG, UPH, UTP, UGL, UKET, UBIL, UNIT, UROB, ULEU, UEPI, UWBC, URBC, UBAC, CAST, UCOM, BILUA  MICROBIOLOGY: No results found for this or any previous visit (from the past 240 hour(s)).  RADIOLOGY STUDIES/RESULTS: Dg Chest 2 View  12/25/2014   CLINICAL DATA:  Stroke  EXAM: CHEST  2 VIEW  COMPARISON:  05/27/2009  FINDINGS: Bibasilar atelectasis and effusion. Vascular congestion with probable fluid overload. No significant edema.  Vertebroplasty in the mid thoracic spine two levels. Lungs are hyperinflated.  IMPRESSION: Findings consistent with mild fluid overload with vascular congestion and small bilateral effusions and bibasilar atelectasis.    Electronically Signed   By: Franchot Gallo M.D.   On: 12/25/2014 14:13   Ct Head Wo Contrast  12/25/2014   CLINICAL DATA:  [facility]- pt coming from home with stroke like symptoms. LSN at 9pm last night. Pt with right side weakness, right side facial droop, pt is non-verbal which is abnormal for her. Pt able to answer questions with head gestures. H/o anemia and chronic headaches.  EXAM: CT HEAD WITHOUT CONTRAST  TECHNIQUE: Contiguous axial images were obtained from the base of the skull through the vertex without intravenous contrast.  COMPARISON:  11/15/2011  FINDINGS: There is loss of the gray-white junction noted along the posterior left frontal lobe extending to the left temporal lobe. This is consistent with recent infarction.  There is no other evidence of a recent cortical infarct.  There are no parenchymal masses or mass effect. Mild periventricular white matter hypoattenuation is noted consistent with stable chronic microvascular ischemic change.  There are no extra-axial masses or abnormal fluid collections.  Ventricles are normal configuration. There is ventricular enlargement consistent with atrophy, similar to the prior exam. No convincing hydrocephalus.  There is no intracranial hemorrhage.  Sinuses and mastoid air cells are clear.  No skull lesion.  IMPRESSION: 1. Recent, likely early subacute, left middle cerebral artery territory infarct involving the posterior left frontal lobe and left temporal lobe. 2. No other acute or recent abnormality. No other evidence of an infarct. 3. No intracranial hemorrhage.   Electronically Signed   By: Lajean Manes M.D.   On: 12/25/2014 08:09   Mr Brain Wo Contrast  12/25/2014   CLINICAL DATA:  Aphasia. Stroke. Right-sided weakness. Abnormal CT of the head  EXAM: MRI HEAD WITHOUT CONTRAST  MRA HEAD WITHOUT CONTRAST  TECHNIQUE: Multiplanar, multiecho pulse sequences of the brain and surrounding structures were obtained without intravenous contrast. Angiographic  images of the head were obtained using MRA technique without contrast.  COMPARISON:  CT head from the same day.  FINDINGS: MRI HEAD FINDINGS  A left MCA territory infarct is confirmed. This involves the insular cortex and operculum. DWI signal in the anterior right frontal lobe represents T2 shine through from a remote or subacute infarct.  T2 changes are associated with remote anterior right frontal lobe infarct. There is minimal subtle change within the acute left MCA territory infarct. Moderate generalized atrophy is present. The ventricles are proportionate to the degree of atrophy.  Flow is present in the major intracranial arteries. Bilateral lens replacements are present.  Mild mucosal thickening is scattered throughout the ethmoid air cells. The mastoid air cells are clear. The remaining paranasal sinuses are clear. The skullbase is unremarkable. Midline structures are within normal limits.  MRA HEAD FINDINGS  The MRA is degraded by patient motion. Internal carotid arteries are within normal limits from the high cervical segments through the ICA termini. The A1 and M1 segments are normal. The anterior communicating artery is patent. The MCA bifurcations are intact. The anterior left M2 segment is occluded approximately 10 mm from its origin. There is asymmetric attenuation of left MCA branch vessels compared to the right. The ACA branch vessels are within normal limits.  The left vertebral artery is dominant. The right vertebral artery terminates at the PICA. The left PICA is not visualized. A left AICA is present. The left PCA originates from the basilar tip. The right PCA is of fetal type.  IMPRESSION: 1. Acute nonhemorrhagic left MCA territory infarct involving the insular cortex and operculum. 2. Remote anterior left right frontal lobe infarct. 3. Moderate generalized atrophy. 4. The MRA demonstrates occlusion of the anterior left M2 division, likely associated with the acute infarct. 5. Mild distal  small vessel disease. 6. No other significant proximal stenosis, aneurysm, or branch vessel occlusion.   Electronically Signed   By: San Morelle M.D.   On: 12/25/2014 14:21   Mr Jodene Nam Head/brain Wo Cm  12/25/2014   CLINICAL DATA:  Aphasia. Stroke. Right-sided weakness. Abnormal CT of the head  EXAM: MRI HEAD WITHOUT CONTRAST  MRA HEAD WITHOUT CONTRAST  TECHNIQUE: Multiplanar, multiecho pulse sequences of the brain and surrounding structures were obtained without intravenous contrast. Angiographic images of the head were obtained using MRA technique without contrast.  COMPARISON:  CT head from the same day.  FINDINGS: MRI HEAD FINDINGS  A left  MCA territory infarct is confirmed. This involves the insular cortex and operculum. DWI signal in the anterior right frontal lobe represents T2 shine through from a remote or subacute infarct.  T2 changes are associated with remote anterior right frontal lobe infarct. There is minimal subtle change within the acute left MCA territory infarct. Moderate generalized atrophy is present. The ventricles are proportionate to the degree of atrophy.  Flow is present in the major intracranial arteries. Bilateral lens replacements are present.  Mild mucosal thickening is scattered throughout the ethmoid air cells. The mastoid air cells are clear. The remaining paranasal sinuses are clear. The skullbase is unremarkable. Midline structures are within normal limits.  MRA HEAD FINDINGS  The MRA is degraded by patient motion. Internal carotid arteries are within normal limits from the high cervical segments through the ICA termini. The A1 and M1 segments are normal. The anterior communicating artery is patent. The MCA bifurcations are intact. The anterior left M2 segment is occluded approximately 10 mm from its origin. There is asymmetric attenuation of left MCA branch vessels compared to the right. The ACA branch vessels are within normal limits.  The left vertebral artery is  dominant. The right vertebral artery terminates at the PICA. The left PICA is not visualized. A left AICA is present. The left PCA originates from the basilar tip. The right PCA is of fetal type.  IMPRESSION: 1. Acute nonhemorrhagic left MCA territory infarct involving the insular cortex and operculum. 2. Remote anterior left right frontal lobe infarct. 3. Moderate generalized atrophy. 4. The MRA demonstrates occlusion of the anterior left M2 division, likely associated with the acute infarct. 5. Mild distal small vessel disease. 6. No other significant proximal stenosis, aneurysm, or branch vessel occlusion.   Electronically Signed   By: San Morelle M.D.   On: 12/25/2014 14:21    Oren Binet, MD  [facility] Pager:[phone]  If 7PM-7AM, please contact night-coverage [email or web address] Password [email or web address] 12/27/2014, 1:31 PM   LOS: 2 days

## 2014-12-27 NOTE — Evaluation (Signed)
Occupational Therapy Evaluation Patient Details Name: Linda [name] MRN: [id number] DOB: [birth year] Today's Date: 12/27/2014    History of Present Illness Linda [name] is an 81 y.o. female who presents with difficulty communicating and right-sided weakness. CT shows early subacute left MCA territory infarct; MRI -Acute nonhemorrhagic left MCA territory infarct involving the insular cortex and operculum. 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    Clinical Impression   Pt admitted with above. Pt independent with ADLs, PTA. Feel pt will benefit from acute OT to increase independence with BADLs prior to d/c. Pt not attending well to right side in session. Recommending CIR for rehab.    Follow Up Recommendations  CIR    Equipment Recommendations  Other (comment) (defer to next venue)    Recommendations for Other Services Rehab consult     Precautions / Restrictions Precautions Precautions: Fall Restrictions Weight Bearing Restrictions: No      Mobility Bed Mobility Overal bed mobility: Needs Assistance Bed Mobility: Supine to Sit     Supine to sit: Max assist     General bed mobility comments: assist with trunk-cues for technique.  Transfers Overall transfer level: Needs assistance Equipment used: 1 person hand held assist Transfers: Sit to/from [facility] Sit to Stand: Min assist;Mod assist Stand pivot transfers: Min assist      Comments: cues for technique.       Balance  Min assist for stand pivot transfer to chair.                                           ADL Overall ADL's : Needs assistance/impaired Eating/Feeding: Sitting;Total assistance Eating/Feeding Details (indicate cue type and reason): tried to get pt to feed herself her crushed up meds with nurse present, but required assist to do so.               Lower Body Dressing: Maximal  assistance;Total assistance;Sit to/from stand   Toilet Transfer: Minimal assistance;Stand-pivot (one person hand held assist from bed to chair)           Functional mobility during ADLs: Minimal assistance (stand pivot) General ADL Comments: Suggested that family/friends sit on Rt side of pt to encourage her to look to right side. Discussed CIR briefly. Recommended that pillow support RUE when pt was in chair or bed and OT positioned one under pt's arm at end of session. Pt did attempt to reach feet in session when asked to take off socks.      Vision  Noted pt not attending to right side. Attempted to test tracking-pt having difficulty tracking both left and right.  Visual fields: difficult to assess   Perception     Praxis      Pertinent Vitals/Pain Pain Assessment: Faces Faces Pain Scale: Hurts little more Pain Location: neck and head Pain Intervention(s): Monitored during session;Other (comment) (RN brought pain meds in session and pt took)     Hand Dominance Right   Extremity/Trunk Assessment Upper Extremity Assessment Upper Extremity Assessment: RUE deficits/detail RUE Deficits / Details: 2+/5 shoulder flexion RUE Sensation: decreased light touch RUE Coordination: decreased fine motor   Lower Extremity Assessment Lower Extremity Assessment: Defer to PT evaluation       Communication Communication Communication: Expressive difficulties   Cognition Arousal/Alertness: Awake/alert Behavior During Therapy: WFL for tasks assessed/performed Overall Cognitive Status: Impaired/Different from  baseline Area of Impairment: Following commands;Attention;Problem solving;Safety/judgement   Current Attention Level: Sustained   Following Commands: Follows one step commands with increased time;Follows one step commands inconsistently Safety/Judgement: Decreased awareness of safety   Problem Solving: Slow processing     General Comments       Exercises       Shoulder  Instructions      Home Living Family/patient expects to be discharged to:: Private residence Living Arrangements: Children Available Help at Discharge: Family;Available PRN/intermittently Type of Home: House Home Access: Stairs to enter [employer] of Steps: 4 Entrance Stairs-Rails: Left Home Layout: Two level;Laundry or work area in basement;Able to live on main level with bedroom/bathroom               Home Equipment: [name] - single point          Prior Functioning/Environment Level of Independence: Independent with assistive device(s)        Comments: ambulates with cane; still driving short distances    OT Diagnosis: Acute pain;Cognitive deficits;Hemiplegia dominant side   OT Problem List: Decreased strength;Decreased activity tolerance;Impaired balance (sitting and/or standing);Decreased knowledge of use of DME or AE;Decreased knowledge of precautions;Decreased cognition;Decreased safety awareness;Pain;Impaired UE functional use;Impaired sensation;Decreased coordination   OT Treatment/Interventions: DME and/or AE instruction;Neuromuscular education;Self-care/ADL training;Therapeutic exercise;Therapeutic activities;Cognitive remediation/compensation;Patient/family education;Balance training;Visual/perceptual remediation/compensation    OT Goals(Current goals can be found in the care plan section) Acute Rehab OT Goals Patient Stated Goal: pt wanted to get in chair OT Goal Formulation: With family Time For Goal Achievement: 01/03/15 Potential to Achieve Goals: Good ADL Goals Pt Will Perform Eating: sitting;with min assist Pt Will Perform Grooming: with min assist;sitting Pt Will Perform Upper Body Dressing: with min assist;sitting Pt Will Transfer to Toilet: with min assist;ambulating;bedside commode  OT Frequency: Min 2X/week   Barriers to D/C:            Co-evaluation              End of Session Equipment Utilized During Treatment: Gait  belt Nurse Communication: Other (comment) (assisted OT in session)  Activity Tolerance: Patient tolerated treatment well Patient left: in chair;with call bell/phone within reach;with chair alarm set;with family/visitor present   Time: 0240-9735 OT Time Calculation (min): 16 min Charges:  OT General Charges $OT Visit: 1 Procedure OT Evaluation $Initial OT Evaluation Tier I: 1 Procedure G-CodesBenito Mccreedy OTR/L [id number] 12/27/2014, 5:30 PM

## 2014-12-28 DIAGNOSIS — M6281 Muscle weakness (generalized): Secondary | ICD-10-CM | POA: Diagnosis not present

## 2014-12-28 DIAGNOSIS — K5900 Constipation, unspecified: Secondary | ICD-10-CM | POA: Diagnosis not present

## 2014-12-28 DIAGNOSIS — R278 Other lack of coordination: Secondary | ICD-10-CM | POA: Diagnosis not present

## 2014-12-28 DIAGNOSIS — Z7901 Long term (current) use of anticoagulants: Secondary | ICD-10-CM | POA: Diagnosis not present

## 2014-12-28 DIAGNOSIS — G2581 Restless legs syndrome: Secondary | ICD-10-CM | POA: Diagnosis not present

## 2014-12-28 DIAGNOSIS — L299 Pruritus, unspecified: Secondary | ICD-10-CM | POA: Diagnosis not present

## 2014-12-28 DIAGNOSIS — R1313 Dysphagia, pharyngeal phase: Secondary | ICD-10-CM | POA: Diagnosis not present

## 2014-12-28 DIAGNOSIS — I63512 Cerebral infarction due to unspecified occlusion or stenosis of left middle cerebral artery: Secondary | ICD-10-CM | POA: Diagnosis not present

## 2014-12-28 DIAGNOSIS — I63312 Cerebral infarction due to thrombosis of left middle cerebral artery: Secondary | ICD-10-CM | POA: Diagnosis not present

## 2014-12-28 DIAGNOSIS — K219 Gastro-esophageal reflux disease without esophagitis: Secondary | ICD-10-CM | POA: Diagnosis not present

## 2014-12-28 DIAGNOSIS — G8929 Other chronic pain: Secondary | ICD-10-CM | POA: Diagnosis not present

## 2014-12-28 DIAGNOSIS — E876 Hypokalemia: Secondary | ICD-10-CM | POA: Diagnosis not present

## 2014-12-28 DIAGNOSIS — R2681 Unsteadiness on feet: Secondary | ICD-10-CM | POA: Diagnosis not present

## 2014-12-28 DIAGNOSIS — I480 Paroxysmal atrial fibrillation: Secondary | ICD-10-CM | POA: Diagnosis not present

## 2014-12-28 DIAGNOSIS — R1310 Dysphagia, unspecified: Secondary | ICD-10-CM | POA: Diagnosis not present

## 2014-12-28 DIAGNOSIS — R1314 Dysphagia, pharyngoesophageal phase: Secondary | ICD-10-CM | POA: Diagnosis not present

## 2014-12-28 DIAGNOSIS — K625 Hemorrhage of anus and rectum: Secondary | ICD-10-CM | POA: Diagnosis not present

## 2014-12-28 DIAGNOSIS — G8190 Hemiplegia, unspecified affecting unspecified side: Secondary | ICD-10-CM | POA: Diagnosis not present

## 2014-12-28 DIAGNOSIS — M542 Cervicalgia: Secondary | ICD-10-CM | POA: Diagnosis not present

## 2014-12-28 DIAGNOSIS — I69321 Dysphasia following cerebral infarction: Secondary | ICD-10-CM | POA: Diagnosis not present

## 2014-12-28 DIAGNOSIS — E43 Unspecified severe protein-calorie malnutrition: Secondary | ICD-10-CM | POA: Diagnosis not present

## 2014-12-28 DIAGNOSIS — E039 Hypothyroidism, unspecified: Secondary | ICD-10-CM | POA: Diagnosis not present

## 2014-12-28 DIAGNOSIS — I639 Cerebral infarction, unspecified: Secondary | ICD-10-CM | POA: Diagnosis not present

## 2014-12-28 DIAGNOSIS — K5901 Slow transit constipation: Secondary | ICD-10-CM | POA: Diagnosis not present

## 2014-12-28 DIAGNOSIS — I69359 Hemiplegia and hemiparesis following cerebral infarction affecting unspecified side: Secondary | ICD-10-CM | POA: Diagnosis not present

## 2014-12-28 DIAGNOSIS — I6789 Other cerebrovascular disease: Secondary | ICD-10-CM | POA: Diagnosis not present

## 2014-12-28 DIAGNOSIS — G253 Myoclonus: Secondary | ICD-10-CM | POA: Diagnosis not present

## 2014-12-28 DIAGNOSIS — I69328 Other speech and language deficits following cerebral infarction: Secondary | ICD-10-CM | POA: Diagnosis not present

## 2014-12-28 DIAGNOSIS — I69320 Aphasia following cerebral infarction: Secondary | ICD-10-CM | POA: Diagnosis not present

## 2014-12-28 DIAGNOSIS — I10 Essential (primary) hypertension: Secondary | ICD-10-CM | POA: Diagnosis not present

## 2014-12-28 DIAGNOSIS — M810 Age-related osteoporosis without current pathological fracture: Secondary | ICD-10-CM | POA: Diagnosis not present

## 2014-12-28 MED ORDER — ASPIRIN 325 MG PO TABS: 325.0000 mg | ORAL_TABLET | Freq: Every day | ORAL | Status: DC

## 2014-12-28 MED ORDER — APIXABAN 5 MG PO TABS
5.0000 mg | ORAL_TABLET | Freq: Two times a day (BID) | ORAL | Status: DC
Start: 2014-12-31 — End: 2015-02-15

## 2014-12-28 MED ORDER — ENSURE ENLIVE PO LIQD: 237.0000 mL | ORAL | Status: DC

## 2014-12-28 MED ORDER — METOPROLOL TARTRATE 25 MG PO TABS: 25.0000 mg | ORAL_TABLET | Freq: Two times a day (BID) | ORAL | Status: DC

## 2014-12-28 MED ORDER — ASPIRIN 325 MG PO TABS: 325.0000 mg | ORAL_TABLET | Freq: Four times a day (QID) | ORAL | Status: DC | PRN

## 2014-12-28 MED ORDER — METOPROLOL TARTRATE 25 MG PO TABS
25.0000 mg | ORAL_TABLET | Freq: Two times a day (BID) | ORAL | Status: DC
  Administered 2014-12-28: 25 mg via ORAL
  Filled 2014-12-28: qty 1

## 2014-12-28 NOTE — Clinical Social Work Note (Signed)
Clinical Social Work Assessment  Patient Details  Name: Linda [name] MRN: [id number] Date of Birth: 05/28/1929  Date of referral:  12/28/14               Reason for consult:  Facility Placement                 Housing/Transportation Living arrangements for the past 2 months:  Apartment Source of Information:  Adult Children Linda [name] [phone]) Patient Interpreter Needed:  None Criminal Activity/Legal Involvement Pertinent to Current Situation/Hospitalization:  No - Comment as needed Significant Relationships:  Adult Children Lives with:  Self Need for family participation in patient care:  Yes (Comment)  Care giving concerns:  none  Social Worker assessment / plan:  CSW met the pt's son Linda [name] at the bedside.  CSW introduced self and purpose of the visit. CSW discussed clinical recommendation for SNF rehab. CSW inquired about the geographical location in which the pt would like to receive rehab from. Linda [name] reported that he would like to pt to go to [facility]. CSW explained the SNF rehab process. CSW discussed insurance and its relation to SNF rehab. CSW answered all questions in which the Linda [name] inquired about. CSW provided Linda [name] with contact information for further questions. CSW will continue to follow this pt and assist with discharge as needed.   PT Recommendations:  Inpatient Rehab Consult  Patient/Family's Response to care: Linda [name] was content with the pt transiting to a SNF.   Patient/Family's Understanding of and Emotional Response to Diagnosis, Current Treatment, and Prognosis: Linda [name] appeared guarded and distant. Linda [name] did not disclosure his feelings regarding the pt's current diagnose/treatment.    Emotional Assessment Appearance:  Appears stated age Attitude/Demeanor/Rapport:  Unable to Assess Affect (typically observed):  Quiet Orientation:  Followed commends only  Alcohol / Substance use:  Not Applicable Psych involvement (Current and /or in the community):  No  (Comment)  Discharge Needs  Concerns to be addressed:  No discharge needs identified    Current discharge risk:  None Barriers to Discharge:  No Barriers Identified

## 2014-12-28 NOTE — Progress Notes (Signed)
Rehab admissions - I met with family this am at the bedside.  Family want [facility] since it is close to home.  Patient agreeable to [facility] but not in inpatient rehab.  Patient indicated that she did not want to do 3 hours of therapy a day.  I have notified SW of family decision for [facility].  Call me for questions.  #[phone] 

## 2014-12-28 NOTE — Progress Notes (Signed)
Speech Language Pathology Treatment: Dysphagia;Cognitive-Linquistic  Patient Details Name: Linda [name] MRN: [id number] DOB: [birth year] Today's Date: 12/28/2014 Time: 9233-0076 SLP Time Calculation (min) (ACUTE ONLY): 33 min  Assessment / Plan / Recommendation Clinical Impression  Pt seen with lunch with son at bedside, Provided education to son regarding aphsia, total communication and cueing. Provided max verbal, gestural and visual cues during functional task of self feeding to facilitate receptive language. Pt required moderate hand over hand assist to use utensils for self feeding, tolerating puree and nectar thick liquids well with one cough over course of meal, indicating probable sensed penetration/aspiration event. Also education son to safe swallowing techniques. SLP at SNF to f/u with pt.    HPI     Pertinent Vitals Pain Assessment: Faces Faces Pain Scale: Hurts little more Pain Location: neck Pain Intervention(s): Repositioned  SLP Plan  Continue with current plan of care    Recommendations Diet recommendations: Dysphagia 1 (puree);Nectar-thick liquid Liquids provided via: Cup Medication Administration: Crushed with puree Supervision: Staff to assist with self feeding;Full supervision/cueing for compensatory strategies Compensations: Slow rate;Small sips/bites;Check for pocketing Postural Changes and/or Swallow Maneuvers: Seated upright 90 degrees;Out of bed for meals              Oral Care Recommendations: Oral care BID Follow up Recommendations: Skilled Nursing facility Plan: Continue with current plan of care    GO    [facility], [place] CCC-SLP [phone]  Lynann Beaver 12/28/2014, 1:24 PM

## 2014-12-28 NOTE — Clinical Social Work Placement (Signed)
   CLINICAL SOCIAL WORK PLACEMENT  NOTE  Date:  12/28/2014  Patient Details  Name: Linda [name] MRN: [id number] Date of Birth: March 26, 1929  Clinical Social Work is seeking post-discharge placement for this patient at the [place] level of care (*CSW will initial, date and re-position this form in  chart as items are completed):  Yes   Patient/family provided with [facility] Work Department's list of facilities offering this level of care within the geographic area requested by the patient (or if unable, by the patient's family).  Yes   Patient/family informed of their freedom to choose among providers that offer the needed level of care, that participate in Medicare, Medicaid or managed care program needed by the patient, have an available bed and are willing to accept the patient.  Yes   Patient/family informed of [place]'s ownership interest in [facility] and [facility], as well as of the fact that they are under no obligation to receive care at these facilities.  PASRR submitted to EDS on 12/28/14     PASRR number received on 12/28/14     Existing PASRR number confirmed on       FL2 transmitted to all facilities in geographic area requested by pt/family on 12/27/14     FL2 transmitted to all facilities within larger geographic area on       Patient informed that his/her managed care company has contracts with or will negotiate with certain facilities, including the following:        Yes   Patient/family informed of bed offers received.  Patient chooses bed at [facility]     Physician recommends and patient chooses bed at      Patient to be transferred to [facility] on 12/28/14.  Patient to be transferred to facility by [facility]      Patient family notified on 12/28/14 of transfer.  Name of family member notified:  Linda [name], son     PHYSICIAN       Additional Comment:    Greta Doom, MSW, [place]

## 2014-12-28 NOTE — Discharge Summary (Signed)
PATIENT DETAILS Name: Linda [name] Age: 81 y.o. Sex: female Date of Birth: 1928-11-02 MRN: [id number]. Admitting Physician: Orson Eva, MD DUK:GURKY,HCWCBJ Monia Pouch, MD  Admit Date: 12/25/2014 Discharge date: 12/28/2014  Recommendations for Outpatient Follow-up:  1. Has atrial fibrillation-start Eliquis on 4/29, please stop aspirin when started on Eliquis 2. Please have speech therapy follow patient while at SNF  3. Repeat lipid panel in the next 3-6 months  PRIMARY DISCHARGE DIAGNOSIS:  Active Problems:   CVA (cerebral vascular accident)   Left acute arterial ischemic stroke, MCA (middle cerebral artery)   Essential hypertension   Dysphagia, pharyngoesophageal phase   Hypothyroidism   Protein-calorie malnutrition, severe   Paroxysmal atrial fibrillation      PAST MEDICAL HISTORY: Past Medical History  Diagnosis Date  . History of cardiac catheterization 2002    Negative  . Vertebral fracture   . Restless legs syndrome with nocturnal myoclonus 05/13/2013     requip controlled   . Abnormal uterine bleeding 6283,[id number]  . Hypothyroidism   . Arthritis   . Osteoporosis   . History of stomach ulcers   . History of jaundice as a child   . Anemia   . Head pain, chronic     "I have pain in the back of my head for months"  . Dizziness     TAKES MECLIZINE TO TREAT    DISCHARGE MEDICATIONS: Current Discharge Medication List    START taking these medications   Details  apixaban (ELIQUIS) 5 MG TABS tablet Take 1 tablet (5 mg total) by mouth 2 (two) times daily. Start on 12/31/14. Please discontinue Aspirin once started on Eliquis! Qty: 60 tablet    aspirin 325 MG tablet Take 1 tablet (325 mg total) by mouth every 6 (six) hours as needed. Please stop Aspirin once Eliquis started  on 12/31/14    feeding supplement, ENSURE ENLIVE, (ENSURE ENLIVE) LIQD Take 237 mLs by mouth daily. Qty: 237 mL, Refills: 12    metoprolol tartrate (LOPRESSOR) 25 MG tablet Take 1 tablet (25 mg  total) by mouth 2 (two) times daily.      CONTINUE these medications which have NOT CHANGED   Details  acetaminophen (TYLENOL) 500 MG tablet Take 1,000 mg by mouth every 6 (six) hours as needed. Usually only takes 2 x a  day    calcium gluconate 500 MG tablet Take 500 mg by mouth 2 (two) times daily. With 1000 units D3    cholecalciferol (VITAMIN D) 1000 UNITS tablet Take 1,000 Units by mouth 2 (two) times daily. Take with calium    Cranberry 200 MG CAPS Take 1 capsule by mouth daily with lunch.     denosumab (PROLIA) 60 MG/ML SOLN injection Inject 60 mg into the skin every 6 (six) months. Administer in upper arm, thigh, or abdomen    Flaxseed, Linseed, (FLAX SEED OIL) 1000 MG CAPS Take 1 capsule by mouth 2 (two) times daily.     ipratropium (ATROVENT) 0.06 % nasal spray Place 2 sprays into both nostrils 2 (two) times daily as needed for rhinitis.     levothyroxine (SYNTHROID, LEVOTHROID) 50 MCG tablet Take 50 mcg by mouth every morning.     meclizine (ANTIVERT) 25 MG tablet Take 25 mg by mouth daily as needed for dizziness.     Multiple Vitamins-Minerals (CENTRUM SILVER PO) Take 1 tablet by mouth daily with lunch.     ondansetron (ZOFRAN) 4 MG tablet Take 1 tablet (4 mg total) by mouth every 8 (eight) hours  as needed for nausea or vomiting. Qty: 20 tablet, Refills: 0    pantoprazole (PROTONIX) 40 MG tablet Take 40 mg by mouth daily.    Propylene Glycol (SYSTANE BALANCE OP) Apply 1 drop to eye 2 (two) times daily.    rOPINIRole (REQUIP) 0.25 MG tablet TAKE 1 TABLET BY MOUTH 3 TIMES A DAY Qty: 270 tablet, Refills: 2      STOP taking these medications     aspirin EC 81 MG tablet      metoprolol succinate (TOPROL-XL) 25 MG 24 hr tablet      traMADol (ULTRAM) 50 MG tablet         ALLERGIES:   Allergies  Allergen Reactions  . Ibuprofen Anaphylaxis    Passed out and had to be hospitalized  . Penicillins     Skin on head was crawling and very very sleepy  . Demerol  [Meperidine Hcl] Nausea And Vomiting  . Dilaudid [Hydromorphone Hcl] Nausea And Vomiting  . Lyrica [Pregabalin]     "made me a zombie'  . Morphine And Related Nausea And Vomiting  . Percocet [Oxycodone-Acetaminophen] Nausea And Vomiting  . Vicodin [Hydrocodone-Acetaminophen] Nausea And Vomiting    BRIEF HPI:  See H&P, Labs, Consult and Test reports for all details in brief, patient is a 81 year old female with history of atrial fibrillation, hypertension, who was admitted with aphasia and right-sided weakness.  CONSULTATIONS:   neurology  PERTINENT RADIOLOGIC STUDIES: Dg Chest 2 View  12/25/2014   CLINICAL DATA:  Stroke  EXAM: CHEST  2 VIEW  COMPARISON:  05/27/2009  FINDINGS: Bibasilar atelectasis and effusion. Vascular congestion with probable fluid overload. No significant edema.  Vertebroplasty in the mid thoracic spine two levels. Lungs are hyperinflated.  IMPRESSION: Findings consistent with mild fluid overload with vascular congestion and small bilateral effusions and bibasilar atelectasis.   Electronically Signed   By: Franchot Gallo M.D.   On: 12/25/2014 14:13   Ct Head Wo Contrast  12/25/2014   CLINICAL DATA:  [facility]- pt coming from home with stroke like symptoms. LSN at 9pm last night. Pt with right side weakness, right side facial droop, pt is non-verbal which is abnormal for her. Pt able to answer questions with head gestures. H/o anemia and chronic headaches.  EXAM: CT HEAD WITHOUT CONTRAST  TECHNIQUE: Contiguous axial images were obtained from the base of the skull through the vertex without intravenous contrast.  COMPARISON:  11/15/2011  FINDINGS: There is loss of the gray-white junction noted along the posterior left frontal lobe extending to the left temporal lobe. This is consistent with recent infarction.  There is no other evidence of a recent cortical infarct.  There are no parenchymal masses or mass effect. Mild periventricular white matter hypoattenuation is noted consistent  with stable chronic microvascular ischemic change.  There are no extra-axial masses or abnormal fluid collections.  Ventricles are normal configuration. There is ventricular enlargement consistent with atrophy, similar to the prior exam. No convincing hydrocephalus.  There is no intracranial hemorrhage.  Sinuses and mastoid air cells are clear.  No skull lesion.  IMPRESSION: 1. Recent, likely early subacute, left middle cerebral artery territory infarct involving the posterior left frontal lobe and left temporal lobe. 2. No other acute or recent abnormality. No other evidence of an infarct. 3. No intracranial hemorrhage.   Electronically Signed   By: Lajean Manes M.D.   On: 12/25/2014 08:09   Mr Brain Wo Contrast  12/25/2014   CLINICAL DATA:  Aphasia. Stroke. Right-sided weakness.  Abnormal CT of the head  EXAM: MRI HEAD WITHOUT CONTRAST  MRA HEAD WITHOUT CONTRAST  TECHNIQUE: Multiplanar, multiecho pulse sequences of the brain and surrounding structures were obtained without intravenous contrast. Angiographic images of the head were obtained using MRA technique without contrast.  COMPARISON:  CT head from the same day.  FINDINGS: MRI HEAD FINDINGS  A left MCA territory infarct is confirmed. This involves the insular cortex and operculum. DWI signal in the anterior right frontal lobe represents T2 shine through from a remote or subacute infarct.  T2 changes are associated with remote anterior right frontal lobe infarct. There is minimal subtle change within the acute left MCA territory infarct. Moderate generalized atrophy is present. The ventricles are proportionate to the degree of atrophy.  Flow is present in the major intracranial arteries. Bilateral lens replacements are present.  Mild mucosal thickening is scattered throughout the ethmoid air cells. The mastoid air cells are clear. The remaining paranasal sinuses are clear. The skullbase is unremarkable. Midline structures are within normal limits.  MRA HEAD  FINDINGS  The MRA is degraded by patient motion. Internal carotid arteries are within normal limits from the high cervical segments through the ICA termini. The A1 and M1 segments are normal. The anterior communicating artery is patent. The MCA bifurcations are intact. The anterior left M2 segment is occluded approximately 10 mm from its origin. There is asymmetric attenuation of left MCA branch vessels compared to the right. The ACA branch vessels are within normal limits.  The left vertebral artery is dominant. The right vertebral artery terminates at the PICA. The left PICA is not visualized. A left AICA is present. The left PCA originates from the basilar tip. The right PCA is of fetal type.  IMPRESSION: 1. Acute nonhemorrhagic left MCA territory infarct involving the insular cortex and operculum. 2. Remote anterior left right frontal lobe infarct. 3. Moderate generalized atrophy. 4. The MRA demonstrates occlusion of the anterior left M2 division, likely associated with the acute infarct. 5. Mild distal small vessel disease. 6. No other significant proximal stenosis, aneurysm, or branch vessel occlusion.   Electronically Signed   By: San Morelle M.D.   On: 12/25/2014 14:21   Mr Jodene Nam Head/brain Wo Cm  12/25/2014   CLINICAL DATA:  Aphasia. Stroke. Right-sided weakness. Abnormal CT of the head  EXAM: MRI HEAD WITHOUT CONTRAST  MRA HEAD WITHOUT CONTRAST  TECHNIQUE: Multiplanar, multiecho pulse sequences of the brain and surrounding structures were obtained without intravenous contrast. Angiographic images of the head were obtained using MRA technique without contrast.  COMPARISON:  CT head from the same day.  FINDINGS: MRI HEAD FINDINGS  A left MCA territory infarct is confirmed. This involves the insular cortex and operculum. DWI signal in the anterior right frontal lobe represents T2 shine through from a remote or subacute infarct.  T2 changes are associated with remote anterior right frontal lobe  infarct. There is minimal subtle change within the acute left MCA territory infarct. Moderate generalized atrophy is present. The ventricles are proportionate to the degree of atrophy.  Flow is present in the major intracranial arteries. Bilateral lens replacements are present.  Mild mucosal thickening is scattered throughout the ethmoid air cells. The mastoid air cells are clear. The remaining paranasal sinuses are clear. The skullbase is unremarkable. Midline structures are within normal limits.  MRA HEAD FINDINGS  The MRA is degraded by patient motion. Internal carotid arteries are within normal limits from the high cervical segments through the ICA termini.  The A1 and M1 segments are normal. The anterior communicating artery is patent. The MCA bifurcations are intact. The anterior left M2 segment is occluded approximately 10 mm from its origin. There is asymmetric attenuation of left MCA branch vessels compared to the right. The ACA branch vessels are within normal limits.  The left vertebral artery is dominant. The right vertebral artery terminates at the PICA. The left PICA is not visualized. A left AICA is present. The left PCA originates from the basilar tip. The right PCA is of fetal type.  IMPRESSION: 1. Acute nonhemorrhagic left MCA territory infarct involving the insular cortex and operculum. 2. Remote anterior left right frontal lobe infarct. 3. Moderate generalized atrophy. 4. The MRA demonstrates occlusion of the anterior left M2 division, likely associated with the acute infarct. 5. Mild distal small vessel disease. 6. No other significant proximal stenosis, aneurysm, or branch vessel occlusion.   Electronically Signed   By: San Morelle M.D.   On: 12/25/2014 14:21     PERTINENT LAB RESULTS: CBC: No results for input(s): WBC, HGB, HCT, PLT in the last 72 hours. CMET CMP     Component Value Date/Time   NA 138 12/27/2014 0943   K 3.3* 12/27/2014 0943   CL 109 12/27/2014 0943   CO2  22 12/27/2014 0943   GLUCOSE 121* 12/27/2014 0943   BUN 16 12/27/2014 0943   CREATININE 0.86 12/27/2014 0943   CALCIUM 7.7* 12/27/2014 0943   PROT 6.9 12/25/2014 0747   ALBUMIN 3.4* 12/25/2014 0747   AST 31 12/25/2014 0747   ALT 27 12/25/2014 0747   ALKPHOS 111 12/25/2014 0747   BILITOT 0.6 12/25/2014 0747   GFRNONAA 60* 12/27/2014 0943   GFRAA 70* 12/27/2014 0943    GFR CrCl cannot be calculated (Unknown ideal weight.). No results for input(s): LIPASE, AMYLASE in the last 72 hours. No results for input(s): CKTOTAL, CKMB, CKMBINDEX, TROPONINI in the last 72 hours. Invalid input(s): POCBNP No results for input(s): DDIMER in the last 72 hours.  Recent Labs  12/26/14 0732  HGBA1C 5.9*    Recent Labs  12/26/14 0732  CHOL 113  HDL 49  LDLCALC 52  TRIG 58  CHOLHDL 2.3   No results for input(s): TSH, T4TOTAL, T3FREE, THYROIDAB in the last 72 hours.  Invalid input(s): FREET3 No results for input(s): VITAMINB12, FOLATE, FERRITIN, TIBC, IRON, RETICCTPCT in the last 72 hours. Coags: No results for input(s): INR in the last 72 hours.  Invalid input(s): PT Microbiology: No results found for this or any previous visit (from the past 240 hour(s)).   BRIEF HOSPITAL COURSE:  Acute CVA (cerebral vascular accident): Admitted with aphasia and right-sided weakness. CT head negative for acute changes, MRI brain  shows acute infarct involving the insular cortex and operculum. Echocardiogram shows EF around 37-90%, with no embolic source. Carotid Doppler negative for significant stenosis. LDL at goal-52. A1c 5.9.Telemetry shows atrial fibrillation, spoke with the stroke team-Dr.Xu who recommended starting anticoagulation around 5-7 days (around 4/29). Right-sided deficits have improved, started on a dysphagia 1 diet. For now continue with aspirin, we will start anticoagulation with Eliquis on 4/29 and discontinue aspirin on 12/31/14. Being discharged to SNF.    Atrial fibrillation: Rate  controlled with Metoprolol.Chads2vasc 6, per recommendations of neurology, will start anticoagulation in 5-7 days-around 12/31/14.   Essential hypertension: All antihypertensives remain on hold-to allow permissive hypertension on admission, will start Metoprolol on discharge.   Dysphagia: Likely secondary to CVA. Initially kept nothing by mouth and was  unable to participate in swallow evaluation, reevaluated by simply on 4/25, started on a dysphagia 1 diet. Spoke with granddaughter and son at bedside on 4/25, family aware of risk of aspiration. Will need SLP follow up at SNF   Hypothyroidism: Continue levothyroxine   GERD: Continue PPI   Restless leg syndrome: Continue Requip  TODAY-DAY OF DISCHARGE:  Subjective:   Linda [name] today has no headache,no chest abdominal pain,no new weakness tingling or numbness  Objective:   Blood pressure 137/75, pulse 104, temperature 98.3 F (36.8 C), temperature source Oral, resp. rate 20, last menstrual period 09/03/1996, SpO2 98 %.  Intake/Output Summary (Last 24 hours) at 12/28/14 1025 Last data filed at 12/28/14 0938  Gross per 24 hour  Intake     80 ml  Output   1080 ml  Net  -1000 ml   There were no vitals filed for this visit.  Exam Awake Alert, Oriented *3, No new F.N deficits, Normal affect [place].AT,PERRAL Supple Neck,No JVD, No cervical lymphadenopathy appriciated.  Symmetrical Chest wall movement, Good air movement bilaterally, CTAB RRR,No Gallops,Rubs or new Murmurs, No Parasternal Heave +ve B.Sounds, Abd Soft, Non tender, No organomegaly appriciated, No rebound -guarding or rigidity. No Cyanosis, Clubbing or edema, No new Rash or bruise  DISCHARGE CONDITION: Stable  DISPOSITION: SNF  DISCHARGE INSTRUCTIONS:    Activity:  As tolerated with Full fall precautions use walker/cane & assistance as needed  Diet recommendation: Dysphagia 1 diet Aspiration precautions:yes  Discharge Instructions    Ambulatory referral  to Neurology    Complete by:  As directed   Pt will follow up with Dr. Erlinda Hong at [facility] in about 2 months. Thanks.     Diet - low sodium heart healthy    Complete by:  As directed   Dysphagia 1 diet with nectar thick liquid     Increase activity slowly    Complete by:  As directed            Follow-up Information    Follow up with Xu,Jindong, MD. Schedule an appointment as soon as possible for a visit in 2 months.   Specialty:  Neurology   Why:  stroke clinic   Contact information:   [place] [address] [phone]       Follow up with [name], MD. Schedule an appointment as soon as possible for a visit in 2 weeks.   Specialty:  Internal Medicine   Contact information:   [place] [address] [phone]       Follow up with Kerry Hough, MD. Schedule an appointment as soon as possible for a visit in 2 weeks.   Specialty:  Cardiology   Contact information:   [place] [address] [phone]       Total Time spent on discharge equals  45 minutes.  SignedOren Binet 12/28/2014 10:25 AM

## 2014-12-28 NOTE — Progress Notes (Signed)
Discharge orders received. Pt and family notified. IV and tele removed. Pt dressed and belongings packed. Foley removed. Report given to [facility]. Awaiting [facility] to transport pt to SNF.

## 2014-12-29 ENCOUNTER — Encounter: Payer: Self-pay | Admitting: Neurology

## 2014-12-29 ENCOUNTER — Encounter: Payer: Self-pay | Admitting: Registered Nurse

## 2014-12-29 ENCOUNTER — Non-Acute Institutional Stay (SKILLED_NURSING_FACILITY): Payer: Medicare Other | Admitting: Registered Nurse

## 2014-12-29 DIAGNOSIS — E43 Unspecified severe protein-calorie malnutrition: Secondary | ICD-10-CM

## 2014-12-29 DIAGNOSIS — G8929 Other chronic pain: Secondary | ICD-10-CM | POA: Diagnosis not present

## 2014-12-29 DIAGNOSIS — E039 Hypothyroidism, unspecified: Secondary | ICD-10-CM

## 2014-12-29 DIAGNOSIS — I480 Paroxysmal atrial fibrillation: Secondary | ICD-10-CM | POA: Diagnosis not present

## 2014-12-29 DIAGNOSIS — K219 Gastro-esophageal reflux disease without esophagitis: Secondary | ICD-10-CM

## 2014-12-29 DIAGNOSIS — M542 Cervicalgia: Secondary | ICD-10-CM

## 2014-12-29 DIAGNOSIS — G2581 Restless legs syndrome: Secondary | ICD-10-CM | POA: Diagnosis not present

## 2014-12-29 DIAGNOSIS — I63512 Cerebral infarction due to unspecified occlusion or stenosis of left middle cerebral artery: Secondary | ICD-10-CM | POA: Diagnosis not present

## 2014-12-29 DIAGNOSIS — R1314 Dysphagia, pharyngoesophageal phase: Secondary | ICD-10-CM

## 2014-12-29 DIAGNOSIS — G253 Myoclonus: Secondary | ICD-10-CM

## 2014-12-29 DIAGNOSIS — M810 Age-related osteoporosis without current pathological fracture: Secondary | ICD-10-CM

## 2014-12-29 NOTE — Progress Notes (Signed)
Patient ID: Linda [name], female   DOB: Feb 02, 1929, 81 y.o.   MRN: [id number]   Place of Service: [facility]  Allergies  Allergen Reactions  . Ibuprofen Anaphylaxis    Passed out and had to be hospitalized  . Penicillins     Skin on head was crawling and very very sleepy  . Demerol [Meperidine Hcl] Nausea And Vomiting  . Dilaudid [Hydromorphone Hcl] Nausea And Vomiting  . Lyrica [Pregabalin]     "made me a zombie'  . Morphine And Related Nausea And Vomiting  . Percocet [Oxycodone-Acetaminophen] Nausea And Vomiting  . Vicodin [Hydrocodone-Acetaminophen] Nausea And Vomiting    Code Status: Full Code  Goals of Care: Longevity/STR  Chief Complaint  Patient presents with  . Hospitalization Follow-up    HPI Review of record showed 81 y.o. female with PMH of RLS, hypothyroidism, osteoporosis, anemia, chronic neck pain among others is being seen for a follow-up visit post hospital admission from 12/25/14 to 12/28/14 with dysphagia, right hemiparesis, and aphasia s/p acute CVA of left MCA territory involving the insular cortex and operculum. EF 55-60% on 2D Echo, with no embolic source. Carotid Doppler negative for significant stenosis. LDL at goal-52. A1c 5.9. Telemetry showed atrial fibrillation and neurology recommended starting anticoagulation on 4/291/6. She is here for short term rehab and her goal (per family) is to return home. Seen in room today. Patient unable to verbalize needs. Family at beside-stated that she would like for patient to have pain med her chronic neck pain. Also would like for patient to have pain med and "zofran" together as pain medication usually makes her nauseous. No phenergan per family as it makes patient too drowsy. Denies any other concerns.   Review of Systems Unable to obtain due to speech impairment (nonverbal s/p stroke)  Past Medical History  Diagnosis Date  . History of cardiac catheterization 2002    Negative  . Vertebral fracture     . Restless legs syndrome with nocturnal myoclonus 05/13/2013     requip controlled   . Abnormal uterine bleeding 0932,3557  . Hypothyroidism   . Arthritis   . Osteoporosis   . History of stomach ulcers   . History of jaundice as a child   . Anemia   . Head pain, chronic     "I have pain in the back of my head for months"  . Dizziness     TAKES MECLIZINE TO TREAT    Past Surgical History  Procedure Laterality Date  . Back surgery      x 3  . Breast surgery Bilateral     1 lump each breast removed and benign  . Nasal sinus surgery    . Dilation and curettage of uterus  1970  . Catheterization  2003    cardiac cath  . Kyphoplasty  2010  . Cataract extraction Bilateral 2011  . Cholecystectomy  1995    Lap  . Cardiac catheterization  2002    "it was normal" per pt - no record found  . Robotic assisted salpingo oopherectomy Bilateral 11/02/2014    Procedure: ROBOTIC ASSISTED bilateral SALPINGO OOPHORECTOMY;  Surgeon: Everitt Amber, MD;  Location: [facility];  Service: Gynecology;  Laterality: Bilateral;  . Laparoscopy N/A 11/02/2014    Procedure: LAPAROSCOPY DIAGNOSTIC;  Surgeon: Everitt Amber, MD;  Location: [facility];  Service: Gynecology;  Laterality: N/A;    History  Substance Use Topics  . Smoking status: Never Smoker   . Smokeless tobacco: Never Used  .  Alcohol Use: No    Family History  Problem Relation Age of Onset  . Asthma Brother   . Osteoarthritis Son   . Hypertension Father   . Stroke Father   . Cancer Brother     stomach      Medication List       This list is accurate as of: 12/29/14 10:15 PM.  Always use your most recent med list.               acetaminophen 500 MG tablet  Commonly known as:  TYLENOL  Take 1,000 mg by mouth every 6 (six) hours as needed. Usually only takes 2 x a  day     apixaban 5 MG Tabs tablet  Commonly known as:  ELIQUIS  Take 1 tablet (5 mg total) by mouth 2 (two) times daily. Start on 12/31/14. Please discontinue Aspirin once  started on Eliquis!  Start taking on:  12/31/2014     aspirin 325 MG tablet  Take 325 mg by mouth daily.     calcium gluconate 500 MG tablet  Take 500 mg by mouth 2 (two) times daily. With 1000 units D3     CENTRUM SILVER PO  Take 1 tablet by mouth daily with lunch.     cholecalciferol 1000 UNITS tablet  Commonly known as:  VITAMIN D  Take 1,000 Units by mouth 2 (two) times daily. Take with calium     Cranberry 200 MG Caps  Take 1 capsule by mouth daily with lunch.     denosumab 60 MG/ML Soln injection  Commonly known as:  PROLIA  Inject 60 mg into the skin every 6 (six) months. Administer in upper arm, thigh, or abdomen     feeding supplement (ENSURE ENLIVE) Liqd  Take 237 mLs by mouth daily.     Flax Seed Oil 1000 MG Caps  Take 1 capsule by mouth 2 (two) times daily.     ipratropium 0.06 % nasal spray  Commonly known as:  ATROVENT  Place 2 sprays into both nostrils 2 (two) times daily as needed for rhinitis.     Lacosamide 100 MG Tabs  Take by mouth as directed.     levothyroxine 50 MCG tablet  Commonly known as:  SYNTHROID, LEVOTHROID  Take 50 mcg by mouth every morning.     meclizine 25 MG tablet  Commonly known as:  ANTIVERT  Take 25 mg by mouth daily as needed for dizziness.     metoprolol tartrate 25 MG tablet  Commonly known as:  LOPRESSOR  Take 1 tablet (25 mg total) by mouth 2 (two) times daily.     ondansetron 4 MG tablet  Commonly known as:  ZOFRAN  Take 1 tablet (4 mg total) by mouth every 8 (eight) hours as needed for nausea or vomiting.     pantoprazole sodium 40 mg/20 mL Pack  Commonly known as:  PROTONIX  Take 40 mg by mouth daily.     rOPINIRole 0.25 MG tablet  Commonly known as:  REQUIP  TAKE 1 TABLET BY MOUTH 3 TIMES A DAY     SYSTANE BALANCE OP  Apply 1 drop to eye 2 (two) times daily.     traMADol 50 MG tablet  Commonly known as:  ULTRAM  Take 50 mg by mouth every 8 (eight) hours as needed.        Physical Exam  BP 104/66  mmHg  Pulse 81  Temp(Src) 98 F (36.7 C)  Resp 16  Ht 5\' 3"  (1.6 m)  SpO2 95%  LMP 09/03/1996 (Approximate)  Constitutional: frail/cachetic elderly female in no acute distress.  HEENT: Normocephalic and atraumatic. PERRL. EOM intact. No icterus. Oral mucosa moist.  Neck: Supple and nontender. No lymphadenopathy, masses, or thyromegaly. No JVD or carotid bruits. Cardiac: Normal S1, S2. RRR without appreciable murmurs, rubs, or gallops. Distal pulses intact. No dependent edema.  Respiratory: Unlabored respiration. Breath sounds clear bilaterally without rales, rhonchi, or wheezes. GI: Audible bowel sounds in all quadrants. Soft, nontender, nondistended.  Musculoskeletal: able to move all extremities. Right hemiparesis noted.  Skin: Warm and dry.  Neurological: Alert with aphasia  Psychiatric: Judgment and insight adequate. Appropriate mood and affect.   Labs Reviewed  CBC Latest Ref Rng 12/25/2014 12/25/2014 10/29/2014  WBC 4.0 - 10.5 K/uL - 8.1 19.2(H)  Hemoglobin 12.0 - 15.0 g/dL 14.6 13.1 14.1  Hematocrit 36.0 - 46.0 % 43.0 40.3 43.2  Platelets 150 - 400 K/uL - 323 255    CMP Latest Ref Rng 12/27/2014 12/25/2014 12/25/2014  Glucose 70 - 99 mg/dL 121(H) 104(H) 111(H)  BUN 6 - 23 mg/dL 16 18 16   Creatinine 0.50 - 1.10 mg/dL 0.86 1.10 1.20(H)  Sodium 135 - 145 mmol/L 138 142 140  Potassium 3.5 - 5.1 mmol/L 3.3(L) 4.3 4.3  Chloride 96 - 112 mmol/L 109 105 107  CO2 19 - 32 mmol/L 22 - 26  Calcium 8.4 - 10.5 mg/dL 7.7(L) - 9.5  Total Protein 6.0 - 8.3 g/dL - - 6.9  Total Bilirubin 0.3 - 1.2 mg/dL - - 0.6  Alkaline Phos 39 - 117 U/L - - 111  AST 0 - 37 U/L - - 31  ALT 0 - 35 U/L - - 27    Lab Results  Component Value Date   HGBA1C 5.9* 12/26/2014    Lipid Panel     Component Value Date/Time   CHOL 113 12/26/2014 0732   TRIG 58 12/26/2014 0732   HDL 49 12/26/2014 0732   CHOLHDL 2.3 12/26/2014 0732   VLDL 12 12/26/2014 0732   LDLCALC 52 12/26/2014 0732    Diagnostic  Studies Reviewed 12/27/14: 2D Echo:  - Left ventricle: The cavity size was normal. There was mild focal basal hypertrophy of the septum. Systolic function was normal. The estimated ejection fraction was in the range of 55% to 60%. Wall motion was normal; there were no regional wall motion abnormalities. - Mitral valve: Calcified annulus.  12/27/14: Carotid Duplex: The vertebral arteries appear patent with antegrade flow. - Findings consistent with 1-39 percent stenosis involving the right internal carotid artery and the left internal carotid artery  12/25/14: MRI head 1. Acute nonhemorrhagic left MCA territory infarct involving the insular cortex and operculum. 2. Remote anterior left right frontal lobe infarct. 3. Moderate generalized atrophy. 4. The MRA demonstrates occlusion of the anterior left M2 division, likely associated with the acute infarct. 5. Mild distal small vessel disease. 6. No other significant proximal stenosis, aneurysm, or branch vessel occlusion  12/25/14: CXR: Findings consistent with mild fluid overload with vascular congestion and small bilateral effusions and bibasilar atelectasis.  Assessment & Plan 1. Left acute arterial ischemic stroke, MCA (middle cerebral artery) With aphasia and right hemiparesis. Continue to work with SLP and PT/OT. Continue aspirin daily for now. Will start anticoagulation with Eliquis on 4/29. Monitor clinically.   2. Paroxysmal atrial fibrillation Stable. Rate-controlled. Continue metoprolol 25mg  twice daily. CHADSVASC 6. Continue aspirin until 12/30/14 then start eliquis 5mg  twice daily on 12/31/14 for  anticoagulation  3. Dysphagia, pharyngoesophageal phase Secondary to CVA. SLP to evaluate and treat. Continue dysphagia 1 diet and aspiration precautions.   4. Hypothyroidism, unspecified hypothyroidism type Continue synthroid 50cmg daily  5. Protein-calorie malnutrition, severe Continue current diet and dietary recommendation for now.    6. Restless legs syndrome with nocturnal myoclonus Continue ropinirole 0.25mg  three times daily  7. Osteoporosis Continue prolia 60mg  every six months.  8. Gastroesophageal reflux disease, esophagitis presence not specified Continue protonix 40mg  daily.  9. Chronic neck pain Continue tylenol 1000mg  every 6 hours as needed for pain. Start tramadol 50mg  every 8 hours as needed for moderate to severe pain per family request. Encourage taking pain med with food to minimize GI effects. May have zofran 4mg  every 8 hours as needed for n/v.  Continue to monitor  Diagnostic Studies/Labs Ordered: cbc, bmp in 1 week  Time spent: 40 minutes with >50% of total time spent on care coordination   Family/Staff Communication Plan of care discussed with patient, family, and nursing staff. Patient, family, and nursing staff verbalized understanding and agree with plan of care. No additional questions or concerns reported.    Arthur Holms, MSN, AGNP-C [facility] [address] [place], [place] [address] [phone] [8am-5pm] After hours: [phone]

## 2014-12-30 ENCOUNTER — Telehealth: Payer: Self-pay | Admitting: *Deleted

## 2014-12-30 ENCOUNTER — Non-Acute Institutional Stay (SKILLED_NURSING_FACILITY): Payer: Medicare Other | Admitting: Internal Medicine

## 2014-12-30 ENCOUNTER — Encounter: Payer: Self-pay | Admitting: Internal Medicine

## 2014-12-30 DIAGNOSIS — E43 Unspecified severe protein-calorie malnutrition: Secondary | ICD-10-CM

## 2014-12-30 DIAGNOSIS — K219 Gastro-esophageal reflux disease without esophagitis: Secondary | ICD-10-CM | POA: Diagnosis not present

## 2014-12-30 DIAGNOSIS — I63512 Cerebral infarction due to unspecified occlusion or stenosis of left middle cerebral artery: Secondary | ICD-10-CM

## 2014-12-30 DIAGNOSIS — G8929 Other chronic pain: Secondary | ICD-10-CM | POA: Diagnosis not present

## 2014-12-30 DIAGNOSIS — E039 Hypothyroidism, unspecified: Secondary | ICD-10-CM | POA: Diagnosis not present

## 2014-12-30 DIAGNOSIS — R1314 Dysphagia, pharyngoesophageal phase: Secondary | ICD-10-CM | POA: Diagnosis not present

## 2014-12-30 DIAGNOSIS — G253 Myoclonus: Secondary | ICD-10-CM | POA: Diagnosis not present

## 2014-12-30 DIAGNOSIS — M542 Cervicalgia: Secondary | ICD-10-CM | POA: Diagnosis not present

## 2014-12-30 DIAGNOSIS — G8190 Hemiplegia, unspecified affecting unspecified side: Secondary | ICD-10-CM | POA: Diagnosis not present

## 2014-12-30 DIAGNOSIS — G8191 Hemiplegia, unspecified affecting right dominant side: Secondary | ICD-10-CM

## 2014-12-30 DIAGNOSIS — I480 Paroxysmal atrial fibrillation: Secondary | ICD-10-CM | POA: Diagnosis not present

## 2014-12-30 DIAGNOSIS — G2581 Restless legs syndrome: Secondary | ICD-10-CM | POA: Diagnosis not present

## 2014-12-30 NOTE — Telephone Encounter (Signed)
Received a fax from [employer] for FMLA forms to be completed for son, Linda [name]. Patient is resident of [facility]. Faxed to [employer].

## 2014-12-30 NOTE — Progress Notes (Signed)
Patient ID: Linda [name], female   DOB: [birth year], 81 y.o.   MRN: [id number]     Facility: [facility]    PCP: [name], MD  Code Status: full code  Allergies  Allergen Reactions  . Ibuprofen Anaphylaxis    Passed out and had to be hospitalized  . Penicillins     Skin on head was crawling and very very sleepy  . Demerol [Meperidine Hcl] Nausea And Vomiting  . Dilaudid [Hydromorphone Hcl] Nausea And Vomiting  . Lyrica [Pregabalin]     "made me a zombie'  . Morphine And Related Nausea And Vomiting  . Percocet [Oxycodone-Acetaminophen] Nausea And Vomiting  . Vicodin [Hydrocodone-Acetaminophen] Nausea And Vomiting    Chief Complaint  Patient presents with  . New Admit To SNF     HPI:  81 year old patient is here for short term rehabilitation post hospital admission from 12/25/14 - 12/28/14 with acute CVA of left MCA territory involving the insular cortex and operculum. Neurology was consulted. She had afib and it was recommended to start her on oral anticoagulation from 12/31/14. her Echocardiogram showed no embolic source, carotid Doppler was negative for significant stenosis. She is seen in her room today. She is non verbal but can comprehend. She worked with therapy team today and walked in the hallway with assistance. She has right sided weakness. She is also working with SLP team. Staff have provided FMLA form to be filled out for her son. She has PMH of RLS, hypothyroidism, osteoporosis, anemia, chronic neck pain among others.   Review of Systems: from patient with her nodding her head Constitutional: Negative for fever, chills, diaphoresis.  HENT: Negative for headache, congestion Eyes: Negative for eye pain, blurred vision, double vision and discharge.  Respiratory: Negative for cough, shortness of breath and wheezing.   Cardiovascular: Negative for chest pain, palpitations, leg swelling.  Gastrointestinal: Negative for heartburn,  nausea, vomiting, abdominal pain. Had bowel movement today Genitourinary: Negative for dysuria Musculoskeletal: Negative for falls in facility. Denies pain Skin: Negative for itching, rash.  Neurological: positive for weakness Psychiatric/Behavioral: Negative for depression. Coping well with current situation of her health   Past Medical History  Diagnosis Date  . History of cardiac catheterization 2002    Negative  . Vertebral fracture   . Restless legs syndrome with nocturnal myoclonus 05/13/2013     requip controlled   . Abnormal uterine bleeding [id number]  . Hypothyroidism   . Arthritis   . Osteoporosis   . History of stomach ulcers   . History of jaundice as a child   . Anemia   . Head pain, chronic     "I have pain in the back of my head for months"  . Dizziness     TAKES MECLIZINE TO TREAT   Past Surgical History  Procedure Laterality Date  . Back surgery      x 3  . Breast surgery Bilateral     1 lump each breast removed and benign  . Nasal sinus surgery    . Dilation and curettage of uterus  1970  . Catheterization  2003    cardiac cath  . Kyphoplasty  2010  . Cataract extraction Bilateral 2011  . Cholecystectomy  1995    Lap  . Cardiac catheterization  2002    "it was normal" per pt - no record found  . Robotic assisted salpingo oopherectomy Bilateral 11/02/2014    Procedure: ROBOTIC ASSISTED bilateral SALPINGO OOPHORECTOMY;  Surgeon:  Everitt Amber, MD;  Location: [facility];  Service: Gynecology;  Laterality: Bilateral;  . Laparoscopy N/A 11/02/2014    Procedure: LAPAROSCOPY DIAGNOSTIC;  Surgeon: Everitt Amber, MD;  Location: [facility];  Service: Gynecology;  Laterality: N/A;   Social History:   reports that she has never smoked. She has never used smokeless tobacco. She reports that she does not drink alcohol or use illicit drugs.  Family History  Problem Relation Age of Onset  . Asthma Brother   . Osteoarthritis Son   . Hypertension Father   . Stroke Father   .  Cancer Brother     stomach    Medications: Patient's Medications  New Prescriptions   No medications on file  Previous Medications   ACETAMINOPHEN (TYLENOL) 500 MG TABLET    Take 1,000 mg by mouth every 6 (six) hours as needed. Usually only takes 2 x a  day   APIXABAN (ELIQUIS) 5 MG TABS TABLET    Take 1 tablet (5 mg total) by mouth 2 (two) times daily. Start on 12/31/14. Please discontinue Aspirin once started on Eliquis!   ASPIRIN 325 MG TABLET    Take 325 mg by mouth daily.   CALCIUM GLUCONATE 500 MG TABLET    Take 500 mg by mouth 2 (two) times daily. With 1000 units D3   CHOLECALCIFEROL (VITAMIN D) 1000 UNITS TABLET    Take 1,000 Units by mouth 2 (two) times daily. Take with calium   CRANBERRY 200 MG CAPS    Take 1 capsule by mouth daily with lunch.    DENOSUMAB (PROLIA) 60 MG/ML SOLN INJECTION    Inject 60 mg into the skin every 6 (six) months. Administer in upper arm, thigh, or abdomen   FEEDING SUPPLEMENT, ENSURE ENLIVE, (ENSURE ENLIVE) LIQD    Take 237 mLs by mouth daily.   FLAXSEED, LINSEED, (FLAX SEED OIL) 1000 MG CAPS    Take 1 capsule by mouth 2 (two) times daily.    IPRATROPIUM (ATROVENT) 0.06 % NASAL SPRAY    Place 2 sprays into both nostrils 2 (two) times daily as needed for rhinitis.    LACOSAMIDE 100 MG TABS    Take by mouth as directed.   LEVOTHYROXINE (SYNTHROID, LEVOTHROID) 50 MCG TABLET    Take 50 mcg by mouth every morning.    MECLIZINE (ANTIVERT) 25 MG TABLET    Take 25 mg by mouth daily as needed for dizziness.    METOPROLOL TARTRATE (LOPRESSOR) 25 MG TABLET    Take 1 tablet (25 mg total) by mouth 2 (two) times daily.   MULTIPLE VITAMINS-MINERALS (CENTRUM SILVER PO)    Take 1 tablet by mouth daily with lunch.    ONDANSETRON (ZOFRAN) 4 MG TABLET    Take 1 tablet (4 mg total) by mouth every 8 (eight) hours as needed for nausea or vomiting.   PANTOPRAZOLE SODIUM (PROTONIX) 40 MG/20 ML PACK    Take 40 mg by mouth daily.   PROPYLENE GLYCOL (SYSTANE BALANCE OP)    Apply 1  drop to eye 2 (two) times daily.   ROPINIROLE (REQUIP) 0.25 MG TABLET    TAKE 1 TABLET BY MOUTH 3 TIMES A DAY   TRAMADOL (ULTRAM) 50 MG TABLET    Take 50 mg by mouth every 8 (eight) hours as needed.  Modified Medications   No medications on file  Discontinued Medications   No medications on file     Physical Exam: Filed Vitals:   12/30/14 1200  BP: 109/76  Pulse: 86  Temp: 97.3 F (36.3 C)  Resp: 16  SpO2: 98%    General- elderly female, frail, in no acute distress Head- normocephalic, atraumatic Nose- no maxillary or frontal sinus tenderness, no nasal discharge Throat- moist mucus membrane, normal oropharynx Eyes- PERRLA, EOMI, no pallor, no icterus, no discharge, normal conjunctiva, normal sclera Neck- no cervical lymphadenopathy Cardiovascular- normal s1,s2, no murmurs, palpable dorsalis pedis and radial pulses, no leg edema Respiratory- bilateral clear to auscultation, no wheeze, no rhonchi, no crackles, no use of accessory muscles Abdomen- bowel sounds present, soft, non tender Musculoskeletal- able to move all 4 extremities, right sided weakness noted Neurological- alert and oriented, aphasia, can comprehend, right sided weakness Skin- warm and dry Psychiatry- normal mood and affect    Labs reviewed: Basic Metabolic Panel:  Recent Labs  10/29/14 0930 12/25/14 0747 12/25/14 0801 12/27/14 0943  NA 137 140 142 138  K 4.6 4.3 4.3 3.3*  CL 104 107 105 109  CO2 25 26  --  22  GLUCOSE 142* 111* 104* 121*  BUN 30* 16 18 16   CREATININE 0.90 1.20* 1.10 0.86  CALCIUM 10.1 9.5  --  7.7*   Liver Function Tests:  Recent Labs  10/29/14 0930 12/25/14 0747  AST 40* 31  ALT 43* 27  ALKPHOS 104 111  BILITOT 0.7 0.6  PROT 7.4 6.9  ALBUMIN 4.4 3.4*   No results for input(s): LIPASE, AMYLASE in the last 8760 hours. No results for input(s): AMMONIA in the last 8760 hours. CBC:  Recent Labs  10/29/14 0930 12/25/14 0747 12/25/14 0801  WBC 19.2* 8.1  --     NEUTROABS 17.6* 5.6  --   HGB 14.1 13.1 14.6  HCT 43.2 40.3 43.0  MCV 96.9 96.4  --   PLT 255 323  --      Radiological Exams: 12/27/14: 2D Echo:  - Left ventricle: The cavity size was normal. There was mild focal basal hypertrophy of the septum. Systolic function was normal. The estimated ejection fraction was in the range of 55% to 60%. Wall motion was normal; there were no regional wall motion abnormalities. - Mitral valve: Calcified annulus.  12/27/14: Carotid Duplex: The vertebral arteries appear patent with antegrade flow. - Findings consistent with 1-39 percent stenosis involving the right internal carotid artery and the left internal carotid artery  12/25/14: MRI head 1. Acute nonhemorrhagic left MCA territory infarct involving the insular cortex and operculum. 2. Remote anterior left right frontal lobe infarct. 3. Moderate generalized atrophy. 4. The MRA demonstrates occlusion of the anterior left M2 division, likely associated with the acute infarct. 5. Mild distal small vessel disease. 6. No other significant proximal stenosis, aneurysm, or branch vessel occlusion  12/25/14: CXR: Findings consistent with mild fluid overload with vascular congestion and small bilateral effusions and bibasilar atelectasis.    Assessment/Plan  Acute CVA Had a Left acute arterial ischemic stroke. Has right sided hemiparesis, dysphagia and aphasia. Will have her work with SLP. On nectar thick liquids and pureed food. Aspiration precuations. Assistance with ADLs. Will have her work with physical therapy and occupational therapy team to help with gait training and muscle strengthening exercises.fall precautions. Skin care. Encourage to be out of bed. Continue aspirin daily for now. Will start Eliquis on 4/29. Has neurology follow up  Right hemiparesis Will have patient work with PT/OT as tolerated to regain strength and restore function.  Fall precautions are in place.  Dysphagia Post cva, to  work with SLP team, aspiration precautions and dysphagia diet  for now  Paroxysmal atrial fibrillation Rate controlled. Continue metoprolol 25mg  bid and aspirin for now. Start eliquis from tomorrow for anticoagulation given high CHADS-Vasc score. t  Protein calorie malnutrition Continue dietary supplement, monitor po intake and weight.   FMLA paperwork Filled out the FMLA paper work for her son Sharion Grieves for the time period she resides in the facility.   gerd Stable, continue protonix 40 mg daily  Hypothyroidism Continue synthroid 50cmg daily  Restless legs syndrome Continue ropinirole 0.25mg  three times daily  Chronic neck pain Continue tylenol 1000mg  q6h prn with  tramadol 50mg  q8h prn for severe pain.     Goals of care: short term rehabilitation   Labs/tests ordered: cbc, bmp  Family/ staff Communication: reviewed care plan with patient and charge nurse   Blanchie Serve, MD  [facility] [phone] (Monday-Friday 8 am - 5 pm) [phone] (afterhours)

## 2015-01-03 DIAGNOSIS — Z029 Encounter for administrative examinations, unspecified: Secondary | ICD-10-CM

## 2015-01-04 ENCOUNTER — Institutional Professional Consult (permissible substitution): Payer: Medicare Other | Admitting: Neurology

## 2015-01-07 ENCOUNTER — Non-Acute Institutional Stay (SKILLED_NURSING_FACILITY): Payer: Medicare Other | Admitting: Registered Nurse

## 2015-01-07 DIAGNOSIS — K625 Hemorrhage of anus and rectum: Secondary | ICD-10-CM | POA: Diagnosis not present

## 2015-01-07 DIAGNOSIS — K5901 Slow transit constipation: Secondary | ICD-10-CM

## 2015-01-07 NOTE — Progress Notes (Signed)
Patient ID: Linda [name], female   DOB: 1929-06-21, 81 y.o.   MRN: [id number]   Place of Service: [facility]  Allergies  Allergen Reactions  . Ibuprofen Anaphylaxis    Passed out and had to be hospitalized  . Penicillins     Skin on head was crawling and very very sleepy  . Demerol [Meperidine Hcl] Nausea And Vomiting  . Dilaudid [Hydromorphone Hcl] Nausea And Vomiting  . Lyrica [Pregabalin]     "made me a zombie'  . Morphine And Related Nausea And Vomiting  . Percocet [Oxycodone-Acetaminophen] Nausea And Vomiting  . Vicodin [Hydrocodone-Acetaminophen] Nausea And Vomiting    Code Status: Full Code  Goals of Care: Longevity/STR  Chief Complaint  Patient presents with  . Acute Visit    rectal bleeding    HPI 81 y.o. female with PMH of RLS, hypothyroidism, osteoporosis, anemia, chronic neck pain, atrial fibrillation, CVA among others is being seen for an acute visit at the request of nursing staff for the evaluation of "rectal bleeding". Seen in room today. Family at bedside. Unable to participate hpi or ros. Per family, patient has problem with constipation and believes the bleeding is from straining. Day time NA and nurse is unaware of bloody stool. No hx of hemorrhoid or GI bleed per family.   Review of Systems Unable to obtain due to speech impairment   Past Medical History  Diagnosis Date  . History of cardiac catheterization 2002    Negative  . Vertebral fracture   . Restless legs syndrome with nocturnal myoclonus 05/13/2013     requip controlled   . Abnormal uterine bleeding 9937,1696  . Hypothyroidism   . Arthritis   . Osteoporosis   . History of stomach ulcers   . History of jaundice as a child   . Anemia   . Head pain, chronic     "I have pain in the back of my head for months"  . Dizziness     TAKES MECLIZINE TO TREAT    Past Surgical History  Procedure Laterality Date  . Back surgery      x 3  . Breast surgery Bilateral     1 lump  each breast removed and benign  . Nasal sinus surgery    . Dilation and curettage of uterus  1970  . Catheterization  2003    cardiac cath  . Kyphoplasty  2010  . Cataract extraction Bilateral 2011  . Cholecystectomy  1995    Lap  . Cardiac catheterization  2002    "it was normal" per pt - no record found  . Robotic assisted salpingo oopherectomy Bilateral 11/02/2014    Procedure: ROBOTIC ASSISTED bilateral SALPINGO OOPHORECTOMY;  Surgeon: Everitt Amber, MD;  Location: [facility];  Service: Gynecology;  Laterality: Bilateral;  . Laparoscopy N/A 11/02/2014    Procedure: LAPAROSCOPY DIAGNOSTIC;  Surgeon: Everitt Amber, MD;  Location: [facility];  Service: Gynecology;  Laterality: N/A;    History  Substance Use Topics  . Smoking status: Never Smoker   . Smokeless tobacco: Never Used  . Alcohol Use: No    Family History  Problem Relation Age of Onset  . Asthma Brother   . Osteoarthritis Son   . Hypertension Father   . Stroke Father   . Cancer Brother     stomach      Medication List       This list is accurate as of: 01/07/15  2:04 PM.  Always use your most  recent med list.               acetaminophen 500 MG tablet  Commonly known as:  TYLENOL  Take 1,000 mg by mouth every 6 (six) hours as needed. Usually only takes 2 x a  day     apixaban 5 MG Tabs tablet  Commonly known as:  ELIQUIS  Take 1 tablet (5 mg total) by mouth 2 (two) times daily. Start on 12/31/14. Please discontinue Aspirin once started on Eliquis!     aspirin 325 MG tablet  Take 325 mg by mouth daily.     calcium gluconate 500 MG tablet  Take 500 mg by mouth 2 (two) times daily. With 1000 units D3     CENTRUM SILVER PO  Take 1 tablet by mouth daily with lunch.     cholecalciferol 1000 UNITS tablet  Commonly known as:  VITAMIN D  Take 1,000 Units by mouth 2 (two) times daily. Take with calium     Cranberry 200 MG Caps  Take 1 capsule by mouth daily with lunch.     denosumab 60 MG/ML Soln injection  Commonly  known as:  PROLIA  Inject 60 mg into the skin every 6 (six) months. Administer in upper arm, thigh, or abdomen     docusate sodium 100 MG capsule  Commonly known as:  COLACE  Take 100 mg by mouth 2 (two) times daily.     feeding supplement (ENSURE ENLIVE) Liqd  Take 237 mLs by mouth daily.     Flax Seed Oil 1000 MG Caps  Take 1 capsule by mouth 2 (two) times daily.     ipratropium 0.06 % nasal spray  Commonly known as:  ATROVENT  Place 2 sprays into both nostrils 2 (two) times daily as needed for rhinitis.     Lacosamide 100 MG Tabs  Take by mouth as directed.     levothyroxine 50 MCG tablet  Commonly known as:  SYNTHROID, LEVOTHROID  Take 50 mcg by mouth every morning.     meclizine 25 MG tablet  Commonly known as:  ANTIVERT  Take 25 mg by mouth daily as needed for dizziness.     metoprolol tartrate 25 MG tablet  Commonly known as:  LOPRESSOR  Take 1 tablet (25 mg total) by mouth 2 (two) times daily.     ondansetron 4 MG tablet  Commonly known as:  ZOFRAN  Take 1 tablet (4 mg total) by mouth every 8 (eight) hours as needed for nausea or vomiting.     pantoprazole sodium 40 mg/20 mL Pack  Commonly known as:  PROTONIX  Take 40 mg by mouth daily.     polyethylene glycol packet  Commonly known as:  MIRALAX / GLYCOLAX  Take 17 g by mouth daily as needed.     rOPINIRole 0.25 MG tablet  Commonly known as:  REQUIP  TAKE 1 TABLET BY MOUTH 3 TIMES A DAY     sennosides-docusate sodium 8.6-50 MG tablet  Commonly known as:  SENOKOT-S  Take 2 tablets by mouth daily.     SYSTANE BALANCE OP  Apply 1 drop to eye 2 (two) times daily.     traMADol 50 MG tablet  Commonly known as:  ULTRAM  Take 50 mg by mouth every 8 (eight) hours as needed.        Physical Exam  BP 120/60 mmHg  Pulse 67  Temp(Src) 97 F (36.1 C)  Resp 16  SpO2 98%  LMP 09/03/1996 (Approximate)  Constitutional: frail/cachetic elderly female in no acute distress HEENT: Normocephalic and  atraumatic. PERRL. EOM intact. No icterus. Oral mucosa moist.  Cardiac: Normal S1, S2. RRR without appreciable murmurs, rubs, or gallops. Distal pulses intact. No dependent edema.  Respiratory: Unlabored respiration. Breath sounds clear bilaterally without rales, rhonchi, or wheezes. GI: Audible bowel sounds in all quadrants. Soft, nontender, nondistended. No external hemorrhoid or skin tags noted. Musculoskeletal: able to move all extremities with Right hemiparesis Skin: Warm and dry.  Neurological: Alert with aphasia  Psychiatric: Appropriate mood and affect.   Labs Reviewed  CBC Latest Ref Rng 12/25/2014 12/25/2014 10/29/2014  WBC 4.0 - 10.5 K/uL - 8.1 19.2(H)  Hemoglobin 12.0 - 15.0 g/dL 14.6 13.1 14.1  Hematocrit 36.0 - 46.0 % 43.0 40.3 43.2  Platelets 150 - 400 K/uL - 323 255    CMP Latest Ref Rng 12/27/2014 12/25/2014 12/25/2014  Glucose 70 - 99 mg/dL 121(H) 104(H) 111(H)  BUN 6 - 23 mg/dL 16 18 16   Creatinine 0.50 - 1.10 mg/dL 0.86 1.10 1.20(H)  Sodium 135 - 145 mmol/L 138 142 140  Potassium 3.5 - 5.1 mmol/L 3.3(L) 4.3 4.3  Chloride 96 - 112 mmol/L 109 105 107  CO2 19 - 32 mmol/L 22 - 26  Calcium 8.4 - 10.5 mg/dL 7.7(L) - 9.5  Total Protein 6.0 - 8.3 g/dL - - 6.9  Total Bilirubin 0.3 - 1.2 mg/dL - - 0.6  Alkaline Phos 39 - 117 U/L - - 111  AST 0 - 37 U/L - - 31  ALT 0 - 35 U/L - - 27    Lab Results  Component Value Date   HGBA1C 5.9* 12/26/2014    Lipid Panel     Component Value Date/Time   CHOL 113 12/26/2014 0732   TRIG 58 12/26/2014 0732   HDL 49 12/26/2014 0732   CHOLHDL 2.3 12/26/2014 0732   VLDL 12 12/26/2014 0732   LDLCALC 52 12/26/2014 0732    Diagnostic Studies Reviewed 12/27/14: 2D Echo:  - Left ventricle: The cavity size was normal. There was mild focal basal hypertrophy of the septum. Systolic function was normal. The estimated ejection fraction was in the range of 55% to 60%. Wall motion was normal; there were no regional wall motion abnormalities. -  Mitral valve: Calcified annulus.  12/27/14: Carotid Duplex: The vertebral arteries appear patent with antegrade flow. - Findings consistent with 1-39 percent stenosis involving the right internal carotid artery and the left internal carotid artery  12/25/14: MRI head 1. Acute nonhemorrhagic left MCA territory infarct involving the insular cortex and operculum. 2. Remote anterior left right frontal lobe infarct. 3. Moderate generalized atrophy. 4. The MRA demonstrates occlusion of the anterior left M2 division, likely associated with the acute infarct. 5. Mild distal small vessel disease. 6. No other significant proximal stenosis, aneurysm, or branch vessel occlusion  12/25/14: CXR: Findings consistent with mild fluid overload with vascular congestion and small bilateral effusions and bibasilar atelectasis.  Assessment & Plan 1. Slow transit constipation Start colace 100mg  twice daily, senokot-s 2 tabs daily, with miralax 17g daily as needed for constipation. Encourage hydration  2. Rectal bleeding Perhaps due to straining. Daytime nursing staff did not notice any blood in stool. Will check stool for occult blood as she is currently on anticoagulant. Continue to monitor her status.   Family/Staff Communication Plan of care discussed with patient, family, and nursing staff. Patient, family, and nursing staff verbalized understanding and agree with plan of care. No additional questions or concerns  reported.    Arthur Holms, MSN, AGNP-C [facility] [address] [place], [place] [address] [phone] [8am-5pm] After hours: [phone]

## 2015-01-10 ENCOUNTER — Other Ambulatory Visit: Payer: Self-pay | Admitting: *Deleted

## 2015-01-10 MED ORDER — TRAMADOL HCL 50 MG PO TABS: 50.0000 mg | ORAL_TABLET | Freq: Three times a day (TID) | ORAL | Status: DC | PRN

## 2015-01-10 NOTE — Telephone Encounter (Signed)
[facility] 

## 2015-01-11 ENCOUNTER — Encounter: Payer: Self-pay | Admitting: Cardiology

## 2015-01-11 DIAGNOSIS — Z7901 Long term (current) use of anticoagulants: Secondary | ICD-10-CM

## 2015-01-11 DIAGNOSIS — Z8719 Personal history of other diseases of the digestive system: Secondary | ICD-10-CM

## 2015-01-11 DIAGNOSIS — I63312 Cerebral infarction due to thrombosis of left middle cerebral artery: Secondary | ICD-10-CM | POA: Diagnosis not present

## 2015-01-11 DIAGNOSIS — I480 Paroxysmal atrial fibrillation: Secondary | ICD-10-CM | POA: Diagnosis not present

## 2015-01-11 DIAGNOSIS — E039 Hypothyroidism, unspecified: Secondary | ICD-10-CM | POA: Diagnosis not present

## 2015-01-13 DIAGNOSIS — Z7901 Long term (current) use of anticoagulants: Secondary | ICD-10-CM | POA: Insufficient documentation

## 2015-01-13 NOTE — Progress Notes (Unsigned)
Patient ID: Linda [name], female   DOB: May 07, 1929, 81 y.o.   MRN: [id number]   Linda, [name]    Date of visit:  01/11/2015 DOB:  [birth year]    Age:  81 yrs. Medical record number:  [id number]     Account number:  [id number] Primary Care Provider: Deland Pretty ____________________________ CURRENT DIAGNOSES  1. Paroxysmal atrial fibrillation  2. Cerebral Infarction Due To Thrombosis Of Left Middle Cerebral Artery  3. Long term (current) use of anticoagulants  4. Hypothyroidism ____________________________ ALLERGIES  Demerol, Intolerance-unknown  Dilaudid, Intolerance-unknown  Fentanyl, Rash  Lidoderm, Anaphylaxis  Lyrica, Intolerance-unknown  Oxycodone, Nausea  Penicillins, Anaphylaxis  Ultram, Nausea ____________________________ MEDICATIONS  1. flaxseed 1,000 mg capsule, 1 p.o. daily  2. cranberry 400 mg capsule, 1 p.o. daily  3. biotin 1 mg capsule, 1 p.o. daily  4. multivitamin tablet, 1 p.o. daily  5. Requip 0.25 mg tablet, QHS  6. ipratropium bromide 0.06 % nasal spray, PRN  7. Synthroid 50 mcg tablet, 1 p.o. daily  8. Calcium 600 + D(3) 600 mg calcium-200 unit capsule, BID  9. Prolia 60 mg/mL subcutaneous syringe, q 6 months  10. meclizine 25 mg tablet, PRN  11. Tylenol Extra Strength 500 mg tablet, 650mg  1 qd  12. pantoprazole 40 mg tablet,delayed release, 1 p.o. daily  13. metoprolol succinate ER 25 mg tablet,extended release 24 hr, 1 p.o. daily  14. Eliquis 5 mg tablet, BID  15. aspirin 325 mg tablet, 1 p.o. daily ____________________________ CHIEF COMPLAINTS  Followup of Cerebral Infarction Due To Thrombosis Of Left Middle Cerebral Artery ____________________________ HISTORY OF PRESENT ILLNESS Patient seen back for cardiac followup. She was initially seen by me on April 20 when she was found to have new onset of atrial fibrillation. At that time she was felt to have a CHA2DS2VASC  score of 3 and was started on Eliquis 2.5 mg twice daily because of her weight and  her age of 81. She also had a prior history of GI bleeding. Aspirin was discontinued at that time and she was admitted to the hospital with a left MCA stroke that was thought to be embolic that occur in the morning April 23. A previous echo showed previous LV function that was good. She eventually was discharged on Eliquis 5 mg twice a day and has been in rehabilitation. She has a fairly dense aphasia and is having difficulty walking although she was quite weak beforehand. Her son is with her today. She has been staying at [facility] and receiving rehabilitation there. She is essentially mute but can nod her head yes to questions. She has some mild right-sided weakness but mostly has lack of difficulty with speech and can only say about 4 words. Previously she used a cane to walk. I reviewed the discharge summary and I don't think they appreciated that she had previously been on anticoagulation for the few days prior to coming in. She doesn't have shortness of breath but has to go about in a wheelchair now. She had gone back into sinus rhythm on EKG today. ____________________________ PAST HISTORY  Past Medical Illnesses:  hypothyroidism, anemia, restless legs, vertigo, lumbar disc disease, cervical disc disease, history of GI bleeding 2009 requiring transfusions due to gastric ulcers, left MCA stroke April 2016;  Cardiovascular Illnesses:  atrial fibrillation;  Surgical Procedures:  breast lumpectomy, cataract extraction OU, cholecystectomy (lap), laminectomy cervical, laminectomy lumbar, sinus surgery, D and C;  NYHA Classification:  II;  Canadian Angina Classification:  Class 0: Asymptomatic;  Cardiology Procedures-Invasive:  cardiac cath (left) 2002;  Cardiology Procedures-Noninvasive:  echocardiogram April 2016;  Cardiac Cath Results:  no significant disease;  LVEF of 55% documented via echocardiogram on 12/20/2014,   ____________________________ CARDIO-PULMONARY TEST DATES EKG Date:  01/11/2015;    Cardiac Cath Date:  03/26/2001;  Echocardiography Date: 12/20/2014;   ____________________________ FAMILY HISTORY Brother -- Brother alive with problem, Chronic obstructive lung disease Brother -- Brother dead, Alcoholism, Cancer Brother -- Brother dead, Malignant neoplasm of lung, Malignant neoplasm of liver Father -- Father dead, Cerebral infarction, Hypertension Mother -- Mother dead, Bowel cancer Sister -- Sister alive and well ____________________________ SOCIAL HISTORY Alcohol Use:  no alcohol use;  Smoking:  never smoked;  Diet:  regular diet;  Lifestyle:  widowed;  Exercise:  no regular exercise and gardening;  Occupation:  retired Camera operator;  Residence:  lives with son;  Place of Birth:  [place];   ____________________________ REVIEW OF SYSTEMS General:  denies recent weight change, fatique or change in exercise tolerance. Eyes: wears eye glasses/contact lenses, cataract extraction bilaterally Respiratory: denies dyspnea, cough, wheezing or hemoptysis. Cardiovascular:  please review HPI Abdominal: denies dyspepsia, GI bleeding, constipation, or diarrhea Musculoskeletal:  chronic low back pain Neurological:  restless legs, neuropathy, recent stroke of left MCA Psychiatric:  denies depression or anxiety  ____________________________ PHYSICAL EXAMINATION VITAL SIGNS   unable to weigh,  BP 120/80 sitting, pulse 70  Constitutional:  pleasant white female, in no acute distress Skin:  warm and dry to touch, no apparent skin lesions, or masses noted. Head:  normocephalic, normal hair pattern, no masses or tenderness Neck:  supple, without massess. No JVD, thyromegaly or carotid bruits. Carotid upstroke normal. Chest:  clear to auscultation, kyphotic Cardiac:  regular rhythm, normal S1 and S2, No S3 or S4, no murmurs, gallops or rubs detected. Peripheral Pulses:  the femoral,dorsalis pedis, and posterior tibial pulses are full and equal bilaterally with no bruits  auscultated. Extremities & Back:  dorsal kyphosis, normal ROM of legs Neurological:  expressive aphasia, right arm weakness right leg weakness ____________________________ MOST RECENT LIPID PANEL 04/02/14  CHOL TOTL 129 mg/dl, LDL 52 NM, HDL 61 mg/dl and TRIGLYCER 78 mg/dl ____________________________ IMPRESSIONS/PLAN  1. Recent left MCA stroke with significant aphasia and right-sided weakness 2. Paroxysmal atrial fibrillation-She is in normal sinus rhythm today with first-degree AV block 3. Hypothyroidism 4. Underweight  Recommendations:  She does have a history of peptic ulcer disease and had a stroke despite having been placed on anticoagulation at a dose appropriate for her age and body weight. She should continue her rehabilitation and I will see her back in followup in 3 months. She does have a prior history of GI bleeding from ulcers in 2009 so will need to be very careful with the higher dose of anticoagulation. Followup in 3 months with me.  EKG shows sinus rhythm with first degree AV block. ____________________________ TODAYS ORDERS  1. Return Visit: 3 months  2. 12 Lead EKG: Today                       ____________________________ Cardiology Physician:  Kerry Hough MD [facility]

## 2015-02-01 ENCOUNTER — Non-Acute Institutional Stay (SKILLED_NURSING_FACILITY): Payer: Medicare Other | Admitting: Registered Nurse

## 2015-02-01 ENCOUNTER — Encounter: Payer: Self-pay | Admitting: Registered Nurse

## 2015-02-01 DIAGNOSIS — E43 Unspecified severe protein-calorie malnutrition: Secondary | ICD-10-CM

## 2015-02-01 DIAGNOSIS — G253 Myoclonus: Secondary | ICD-10-CM | POA: Diagnosis not present

## 2015-02-01 DIAGNOSIS — E876 Hypokalemia: Secondary | ICD-10-CM | POA: Diagnosis not present

## 2015-02-01 DIAGNOSIS — I480 Paroxysmal atrial fibrillation: Secondary | ICD-10-CM

## 2015-02-01 DIAGNOSIS — E039 Hypothyroidism, unspecified: Secondary | ICD-10-CM | POA: Diagnosis not present

## 2015-02-01 DIAGNOSIS — G2581 Restless legs syndrome: Secondary | ICD-10-CM

## 2015-02-01 DIAGNOSIS — I63512 Cerebral infarction due to unspecified occlusion or stenosis of left middle cerebral artery: Secondary | ICD-10-CM | POA: Diagnosis not present

## 2015-02-01 DIAGNOSIS — K219 Gastro-esophageal reflux disease without esophagitis: Secondary | ICD-10-CM | POA: Diagnosis not present

## 2015-02-01 DIAGNOSIS — M542 Cervicalgia: Secondary | ICD-10-CM

## 2015-02-01 DIAGNOSIS — K5901 Slow transit constipation: Secondary | ICD-10-CM | POA: Diagnosis not present

## 2015-02-01 DIAGNOSIS — G8929 Other chronic pain: Secondary | ICD-10-CM

## 2015-02-01 DIAGNOSIS — R1314 Dysphagia, pharyngoesophageal phase: Secondary | ICD-10-CM

## 2015-02-01 NOTE — Progress Notes (Signed)
Patient ID: Linda [name], female   DOB: 06-Aug-1929, 81 y.o.   MRN: [id number]   Place of Service: [facility]  Allergies  Allergen Reactions  . Ibuprofen Anaphylaxis    Passed out and had to be hospitalized  . Penicillins     Skin on head was crawling and very very sleepy  . Demerol [Meperidine Hcl] Nausea And Vomiting  . Dilaudid [Hydromorphone Hcl] Nausea And Vomiting  . Lyrica [Pregabalin]     "made me a zombie'  . Morphine And Related Nausea And Vomiting  . Percocet [Oxycodone-Acetaminophen] Nausea And Vomiting  . Vicodin [Hydrocodone-Acetaminophen] Nausea And Vomiting    Code Status: Full Code  Goals of Care: Longevity/STR  Chief Complaint  Patient presents with  . Medical Management of Chronic Issues    cva, hypothyroidism, p. afib, dysphagia, constipation    HPI 81 y.o. female with PMH of RLS, hypothyroidism, osteoporosis, anemia, chronic neck pain, recent left MCA stroke s/p right hemiparesis, dysphasia, aphasia among others is being seen for a routine visit for management of her chronic issues. Weight stable over the past 30 days. No recent fall or skin concerns reported. No change in behavior for functional status reported. No concerns from staff. Seen in room today. Denies any concerns. Dysphagia improving-is now on regular diet with thin liquids. Still works with PT/OT for gait/strength/balance training. P. afib stable on current regimen. No issues with constipation. Chronic neck pain stable with prn tylenol and tramadol.  Review of Systems (nonverbal, communicate by nodding or shaking head) Constitutional: Negative for fever and chills HENT: Negative for ear pain, congestion, and sore throat Eyes: Negative for eye pain and eye discharge Cardiovascular: Negative for chest pain Respiratory: Negative cough, shortness of breath, and wheezing.  Gastrointestinal: Negative for nausea and vomiting. Negative for abdominal pain Genitourinary: Negative for  dysuria. Musculoskeletal: Negative for uncontrolled pain Neurological: Negative for dizziness and headache. Skin: Negative for rash and wound.   Psychiatric: Negative for depression.   Past Medical History  Diagnosis Date  . History of cardiac catheterization 2002    Negative  . Vertebral fracture   . Restless legs syndrome with nocturnal myoclonus 05/13/2013     requip controlled   . Abnormal uterine bleeding 9767,3419  . Hypothyroidism   . Arthritis   . Osteoporosis   . History of stomach ulcers   . History of jaundice as a child   . Anemia   . Head pain, chronic     "I have pain in the back of my head for months"  . Dizziness     TAKES MECLIZINE TO TREAT    Past Surgical History  Procedure Laterality Date  . Back surgery      x 3  . Breast surgery Bilateral     1 lump each breast removed and benign  . Nasal sinus surgery    . Dilation and curettage of uterus  1970  . Catheterization  2003    cardiac cath  . Kyphoplasty  2010  . Cataract extraction Bilateral 2011  . Cholecystectomy  1995    Lap  . Cardiac catheterization  2002    "it was normal" per pt - no record found  . Robotic assisted salpingo oopherectomy Bilateral 11/02/2014    Procedure: ROBOTIC ASSISTED bilateral SALPINGO OOPHORECTOMY;  Surgeon: Everitt Amber, MD;  Location: [facility];  Service: Gynecology;  Laterality: Bilateral;  . Laparoscopy N/A 11/02/2014    Procedure: LAPAROSCOPY DIAGNOSTIC;  Surgeon: Everitt Amber, MD;  Location:  [facility];  Service: Gynecology;  Laterality: N/A;    History  Substance Use Topics  . Smoking status: Never Smoker   . Smokeless tobacco: Never Used  . Alcohol Use: No    Family History  Problem Relation Age of Onset  . Asthma Brother   . Osteoarthritis Son   . Hypertension Father   . Stroke Father   . Cancer Brother     stomach      Medication List       This list is accurate as of: 02/01/15 12:48 PM.  Always use your most recent med list.                acetaminophen 500 MG tablet  Commonly known as:  TYLENOL  Take 1,000 mg by mouth every 6 (six) hours as needed. Usually only takes 2 x a  day     apixaban 5 MG Tabs tablet  Commonly known as:  ELIQUIS  Take 1 tablet (5 mg total) by mouth 2 (two) times daily. Start on 12/31/14. Please discontinue Aspirin once started on Eliquis!     aspirin 325 MG tablet  Take 325 mg by mouth daily.     calcium gluconate 500 MG tablet  Take 500 mg by mouth 2 (two) times daily. With 1000 units D3     CENTRUM SILVER PO  Take 1 tablet by mouth daily with lunch.     cholecalciferol 1000 UNITS tablet  Commonly known as:  VITAMIN D  Take 1,000 Units by mouth 2 (two) times daily. Take with calium     Cranberry 200 MG Caps  Take 1 capsule by mouth daily with lunch.     denosumab 60 MG/ML Soln injection  Commonly known as:  PROLIA  Inject 60 mg into the skin every 6 (six) months. Administer in upper arm, thigh, or abdomen     docusate sodium 100 MG capsule  Commonly known as:  COLACE  Take 100 mg by mouth 2 (two) times daily.     feeding supplement (ENSURE ENLIVE) Liqd  Take 237 mLs by mouth daily.     Flax Seed Oil 1000 MG Caps  Take 1 capsule by mouth 2 (two) times daily.     ipratropium 0.06 % nasal spray  Commonly known as:  ATROVENT  Place 2 sprays into both nostrils 2 (two) times daily as needed for rhinitis.     Lacosamide 100 MG Tabs  Take by mouth as directed.     levothyroxine 50 MCG tablet  Commonly known as:  SYNTHROID, LEVOTHROID  Take 50 mcg by mouth every morning.     meclizine 25 MG tablet  Commonly known as:  ANTIVERT  Take 25 mg by mouth daily as needed for dizziness.     metoprolol tartrate 25 MG tablet  Commonly known as:  LOPRESSOR  Take 1 tablet (25 mg total) by mouth 2 (two) times daily.     ondansetron 4 MG tablet  Commonly known as:  ZOFRAN  Take 1 tablet (4 mg total) by mouth every 8 (eight) hours as needed for nausea or vomiting.     pantoprazole sodium 40  mg/20 mL Pack  Commonly known as:  PROTONIX  Take 40 mg by mouth daily.     polyethylene glycol packet  Commonly known as:  MIRALAX / GLYCOLAX  Take 17 g by mouth daily as needed.     rOPINIRole 0.25 MG tablet  Commonly known as:  REQUIP  TAKE 1 TABLET BY  MOUTH 3 TIMES A DAY     sennosides-docusate sodium 8.6-50 MG tablet  Commonly known as:  SENOKOT-S  Take 2 tablets by mouth daily.     SYSTANE BALANCE OP  Apply 1 drop to eye 2 (two) times daily.     traMADol 50 MG tablet  Commonly known as:  ULTRAM  Take 1 tablet (50 mg total) by mouth every 8 (eight) hours as needed. For pain        Physical Exam  BP 124/74 mmHg  Pulse 84  Temp(Src) 97.2 F (36.2 C)  Resp 16  Ht 5\' 4"  (1.626 m)  Wt 97 lb (43.999 kg)  BMI 16.64 kg/m2  LMP 09/03/1996 (Approximate)  Constitutional: frail/cachetic elderly female in no acute distress. Nonverbal but can communicate by nodding or shaking head for yes and no.  HEENT: Normocephalic and atraumatic. PERRL. EOM intact. No icterus. Oral mucosa moist.  Neck: Supple and nontender. No lymphadenopathy, masses, or thyromegaly. No JVD or carotid bruits. Cardiac: Normal S1, S2. RRR without appreciable murmurs, rubs, or gallops. Distal pulses intact. No dependent edema.  Respiratory: Unlabored respiration. Breath sounds clear bilaterally without rales, rhonchi, or wheezes. GI: Audible bowel sounds in all quadrants. Soft, nontender, nondistended.  Musculoskeletal: able to move all extremities. Right hemiparesis.  Skin: Warm and dry.  Neurological: Alert Psychiatric: Judgment and insight adequate. Appropriate mood and affect.   Labs Reviewed  CBC Latest Ref Rng 12/25/2014 12/25/2014 10/29/2014  WBC 4.0 - 10.5 K/uL - 8.1 19.2(H)  Hemoglobin 12.0 - 15.0 g/dL 14.6 13.1 14.1  Hematocrit 36.0 - 46.0 % 43.0 40.3 43.2  Platelets 150 - 400 K/uL - 323 255    CMP Latest Ref Rng 12/27/2014 12/25/2014 12/25/2014  Glucose 70 - 99 mg/dL 121(H) 104(H) 111(H)  BUN  6 - 23 mg/dL 16 18 16   Creatinine 0.50 - 1.10 mg/dL 0.86 1.10 1.20(H)  Sodium 135 - 145 mmol/L 138 142 140  Potassium 3.5 - 5.1 mmol/L 3.3(L) 4.3 4.3  Chloride 96 - 112 mmol/L 109 105 107  CO2 19 - 32 mmol/L 22 - 26  Calcium 8.4 - 10.5 mg/dL 7.7(L) - 9.5  Total Protein 6.0 - 8.3 g/dL - - 6.9  Total Bilirubin 0.3 - 1.2 mg/dL - - 0.6  Alkaline Phos 39 - 117 U/L - - 111  AST 0 - 37 U/L - - 31  ALT 0 - 35 U/L - - 27    Lab Results  Component Value Date   HGBA1C 5.9* 12/26/2014    Lipid Panel     Component Value Date/Time   CHOL 113 12/26/2014 0732   TRIG 58 12/26/2014 0732   HDL 49 12/26/2014 0732   CHOLHDL 2.3 12/26/2014 0732   VLDL 12 12/26/2014 0732   LDLCALC 52 12/26/2014 0732    Diagnostic Studies Reviewed 12/27/14: 2D Echo:  - Left ventricle: The cavity size was normal. There was mild focal basal hypertrophy of the septum. Systolic function was normal. The estimated ejection fraction was in the range of 55% to 60%. Wall motion was normal; there were no regional wall motion abnormalities. - Mitral valve: Calcified annulus.  12/27/14: Carotid Duplex: The vertebral arteries appear patent with antegrade flow. - Findings consistent with 1-39 percent stenosis involving the right internal carotid artery and the left internal carotid artery  12/25/14: MRI head 1. Acute nonhemorrhagic left MCA territory infarct involving the insular cortex and operculum. 2. Remote anterior left right frontal lobe infarct. 3. Moderate generalized atrophy. 4. The MRA demonstrates  occlusion of the anterior left M2 division, likely associated with the acute infarct. 5. Mild distal small vessel disease. 6. No other significant proximal stenosis, aneurysm, or branch vessel occlusion  12/25/14: CXR: Findings consistent with mild fluid overload with vascular congestion and small bilateral effusions and bibasilar atelectasis.  Assessment & Plan 1. Left acute arterial ischemic stroke, MCA (middle  cerebral artery) With aphasia, dysphagia, and right hemiparesis. Continue to work with SLP and PT/OT. Continue eliquis 5mg  twice daily. Monitor clinically.   2. Paroxysmal atrial fibrillation Stable. RRR on exam. Continue metoprolol 25mg  twice daily. CHADSVASC 6. Continue eliquis 5mg  twice daily for anticoagulation. Continue to monitor h&h due to hx of GI bleed  3. Dysphagia, pharyngoesophageal phase Secondary to CVA. Improving. Continue regular diet with thin liquids. Aspiration precautions  4. Hypothyroidism, unspecified hypothyroidism type Stable. Continue synthroid 50cmg daily  5. Protein-calorie malnutrition, severe Weight stable over the past 30 days. Continue current diet and dietary recommendation. Continue to monitor her nutritional status.    6. Restless legs syndrome with nocturnal myoclonus No issues. Continue ropinirole 0.25mg  three times daily  7. Gastroesophageal reflux disease, esophagitis presence not specified Stable. Continue protonix 40mg  daily.  8. Chronic neck pain Stable. Continue tylenol 1000mg  every 6 hours as needed for pain with tramadol 50mg  every 8 hours as needed for moderate to severe pain.   9. Constipation Stable. Continue colace 100mg  twice daily with senokot-s 2 tabs twice daily and miralax as needed for constipation. Encourage hydration.  10. Hypokalemia Last k 3.3. Recheck bmet. Continue to monitor her status.   Diagnostic Studies/Labs Ordered: cbc, bmp next lab draw  Family/Staff Communication Plan of care discussed with patient and nursing staff. Patient and nursing staff verbalized understanding and agree with plan of care. No additional questions or concerns reported.    Arthur Holms, MSN, AGNP-C [facility] [address] [place], [place] [address] [phone] [8am-5pm] After hours: [phone]

## 2015-02-02 LAB — HEPATIC FUNCTION PANEL
ALT: 33 U/L (ref 7–35)
AST: 28 U/L (ref 13–35)
Alkaline Phosphatase: 103 U/L (ref 25–125)
Bilirubin, Total: 0.7 mg/dL

## 2015-02-02 LAB — BASIC METABOLIC PANEL
BUN: 19 mg/dL (ref 4–21)
Creatinine: 1 mg/dL (ref 0.5–1.1)
Glucose: 98 mg/dL
POTASSIUM: 4.1 mmol/L (ref 3.4–5.3)
SODIUM: 139 mmol/L (ref 137–147)

## 2015-02-02 LAB — CBC AND DIFFERENTIAL
HEMATOCRIT: 42 % (ref 36–46)
Hemoglobin: 14 g/dL (ref 12.0–16.0)
Platelets: 251 10*3/uL (ref 150–399)
WBC: 5.6 10*3/mL

## 2015-02-03 ENCOUNTER — Non-Acute Institutional Stay (SKILLED_NURSING_FACILITY): Payer: Medicare Other | Admitting: Internal Medicine

## 2015-02-03 DIAGNOSIS — K5900 Constipation, unspecified: Secondary | ICD-10-CM

## 2015-02-03 DIAGNOSIS — L299 Pruritus, unspecified: Secondary | ICD-10-CM

## 2015-02-03 DIAGNOSIS — R1310 Dysphagia, unspecified: Secondary | ICD-10-CM

## 2015-02-03 DIAGNOSIS — I69321 Dysphasia following cerebral infarction: Secondary | ICD-10-CM

## 2015-02-03 DIAGNOSIS — I69391 Dysphagia following cerebral infarction: Secondary | ICD-10-CM

## 2015-02-03 NOTE — Progress Notes (Signed)
Patient ID: Linda [name], female   DOB: 1929-02-05, 81 y.o.   MRN: [id number]    [facility]  PCP: [name], MD  Code Status: Full Code   Allergies  Allergen Reactions  . Ibuprofen Anaphylaxis    Passed out and had to be hospitalized  . Penicillins     Skin on head was crawling and very very sleepy  . Demerol [Meperidine Hcl] Nausea And Vomiting  . Dilaudid [Hydromorphone Hcl] Nausea And Vomiting  . Lyrica [Pregabalin]     "made me a zombie'  . Morphine And Related Nausea And Vomiting  . Percocet [Oxycodone-Acetaminophen] Nausea And Vomiting  . Vicodin [Hydrocodone-Acetaminophen] Nausea And Vomiting    Chief Complaint  Patient presents with  . Acute Visit    Rash and difficulty swallowing      HPI:  81 year old patient is here for short term rehabilitation post hospital admission with acute CVA. She is seen today with complaints of itching in her hands on and off for few weeks. She has not mentioned about it to the staff in the facility but to her family members. She also has been having difficulty swallowing- denies any difficulty with her meals but has it with her medications. She feels them to get stuck in her throat and this is painful. Patient is not verbal but answers yes/ no to questions On review of her chart, only new medication in may are miralax, senna s and colace.  Review of Systems: from patient with her nodding her head Constitutional: Negative for fever, chills, diaphoresis.  HENT: Negative for headache, congestion Respiratory: Negative for cough, shortness of breath and wheezing. Has some sore throat. Denies runny nose Cardiovascular: Negative for chest pain, palpitations Gastrointestinal: Negative for heartburn, nausea, vomiting, abdominal pain.     Past Medical History  Diagnosis Date  . History of cardiac catheterization 2002    Negative  . Vertebral fracture   . Restless legs syndrome with nocturnal myoclonus  05/13/2013     requip controlled   . Abnormal uterine bleeding [id number]  . Hypothyroidism   . Arthritis   . Osteoporosis   . History of stomach ulcers   . History of jaundice as a child   . Anemia   . Head pain, chronic     "I have pain in the back of my head for months"  . Dizziness     TAKES MECLIZINE TO TREAT   Past Surgical History  Procedure Laterality Date  . Back surgery      x 3  . Breast surgery Bilateral     1 lump each breast removed and benign  . Nasal sinus surgery    . Dilation and curettage of uterus  1970  . Catheterization  2003    cardiac cath  . Kyphoplasty  2010  . Cataract extraction Bilateral 2011  . Cholecystectomy  1995    Lap  . Cardiac catheterization  2002    "it was normal" per pt - no record found  . Robotic assisted salpingo oopherectomy Bilateral 11/02/2014    Procedure: ROBOTIC ASSISTED bilateral SALPINGO OOPHORECTOMY;  Surgeon: Everitt Amber, MD;  Location: [facility];  Service: Gynecology;  Laterality: Bilateral;  . Laparoscopy N/A 11/02/2014    Procedure: LAPAROSCOPY DIAGNOSTIC;  Surgeon: Everitt Amber, MD;  Location: [facility];  Service: Gynecology;  Laterality: N/A;   Social History:   reports that she has never smoked. She has never used smokeless tobacco. She reports that she  does not drink alcohol or use illicit drugs.  Family History  Problem Relation Age of Onset  . Asthma Brother   . Osteoarthritis Son   . Hypertension Father   . Stroke Father   . Cancer Brother     stomach    Medications: Patient's Medications  New Prescriptions   No medications on file  Previous Medications   ACETAMINOPHEN (TYLENOL) 500 MG TABLET    Take 1,000 mg by mouth every 6 (six) hours as needed. Usually only takes 2 x a  day   APIXABAN (ELIQUIS) 5 MG TABS TABLET    Take 1 tablet (5 mg total) by mouth 2 (two) times daily. Start on 12/31/14. Please discontinue Aspirin once started on Eliquis!   CALCIUM GLUCONATE 500 MG TABLET    Take 500 mg by mouth 2 (two) times  daily. With 1000 units D3   CHOLECALCIFEROL (VITAMIN D) 1000 UNITS TABLET    Take 1,000 Units by mouth 2 (two) times daily. Take with calium   CRANBERRY 200 MG CAPS    Take 1 capsule by mouth daily with lunch.    DENOSUMAB (PROLIA) 60 MG/ML SOLN INJECTION    Inject 60 mg into the skin every 6 (six) months. Administer in upper arm, thigh, or abdomen   DOCUSATE (COLACE) 50 MG/5ML LIQUID    10 mL by mouth twice daily   FLAXSEED, LINSEED, (FLAX SEED OIL) 1000 MG CAPS    Take 1 capsule by mouth 2 (two) times daily.    LEVOTHYROXINE (SYNTHROID, LEVOTHROID) 50 MCG TABLET    Take 50 mcg by mouth every morning.    MECLIZINE (ANTIVERT) 25 MG TABLET    Take 25 mg by mouth daily as needed for dizziness.    METOPROLOL TARTRATE (LOPRESSOR) 25 MG TABLET    Take 1 tablet (25 mg total) by mouth 2 (two) times daily.   MULTIPLE VITAMINS-MINERALS (CENTRUM SILVER PO)    Take 1 tablet by mouth daily with lunch.    ONDANSETRON (ZOFRAN) 4 MG TABLET    Take 1 tablet (4 mg total) by mouth every 8 (eight) hours as needed for nausea or vomiting.   PANTOPRAZOLE SODIUM (PROTONIX) 40 MG/20 ML PACK    Take 40 mg by mouth daily.   POLYETHYLENE GLYCOL (MIRALAX / GLYCOLAX) PACKET    Take 17 g by mouth daily as needed.   PROPYLENE GLYCOL (SYSTANE BALANCE OP)    Apply 1 drop to eye 2 (two) times daily.   ROPINIROLE (REQUIP) 0.25 MG TABLET    TAKE 1 TABLET BY MOUTH 3 TIMES A DAY   SENNOSIDES-DOCUSATE SODIUM (SENOKOT-S) 8.6-50 MG TABLET    Take 2 tablets by mouth daily.   TRAMADOL (ULTRAM) 50 MG TABLET    Take 1 tablet (50 mg total) by mouth every 8 (eight) hours as needed. For pain  Modified Medications   No medications on file  Discontinued Medications   DOCUSATE SODIUM (COLACE) 100 MG CAPSULE    Take 100 mg by mouth 2 (two) times daily.   FEEDING SUPPLEMENT, ENSURE ENLIVE, (ENSURE ENLIVE) LIQD    Take 237 mLs by mouth daily.   IPRATROPIUM (ATROVENT) 0.06 % NASAL SPRAY    Place 2 sprays into both nostrils 2 (two) times daily as  needed for rhinitis.    LACOSAMIDE 100 MG TABS    Take by mouth as directed.     Physical Exam: Filed Vitals:   02/03/15 1230  BP: 109/68  Pulse: 93  Temp: 97.6 F (36.4  C)  TempSrc: Oral  Resp: 20  SpO2: 96%   General- elderly female, frail, in no acute distress Head- normocephalic, atraumatic Nose- no maxillary or frontal sinus tenderness, no nasal discharge Throat- moist mucus membrane, normal oropharynx Neck- no cervical lymphadenopathy Cardiovascular- normal s1,s2, no murmurs, palpable dorsalis pedis and radial pulses, no leg edema Respiratory- bilateral clear to auscultation, no wheeze, no rhonchi, no crackles, no use of accessory muscles Abdomen- bowel sounds present, soft, non tender Musculoskeletal- able to move all 4 extremities, right sided weakness present more prominent in RUE Neurological- alert and oriented, aphasia, can comprehend, right sided weakness Skin- warm and dry, no visible rash Psychiatry- normal mood and affect   Labs reviewed: Basic Metabolic Panel:  Recent Labs  10/29/14 0930 12/25/14 0747 12/25/14 0801 12/27/14 0943 02/02/15 1248  NA 137 140 142 138 139  K 4.6 4.3 4.3 3.3* 4.1  CL 104 107 105 109  --   CO2 25 26  --  22  --   GLUCOSE 142* 111* 104* 121*  --   BUN 30* 16 18 16 19   CREATININE 0.90 1.20* 1.10 0.86 1.0  CALCIUM 10.1 9.5  --  7.7*  --    Liver Function Tests:  Recent Labs  10/29/14 0930 12/25/14 0747 02/02/15 1248  AST 40* 31 28  ALT 43* 27 33  ALKPHOS 104 111 103  BILITOT 0.7 0.6  --   PROT 7.4 6.9  --   ALBUMIN 4.4 3.4*  --    No results for input(s): LIPASE, AMYLASE in the last 8760 hours. No results for input(s): AMMONIA in the last 8760 hours. CBC:  Recent Labs  10/29/14 0930 12/25/14 0747 12/25/14 0801 02/02/15 1248  WBC 19.2* 8.1  --  5.6  NEUTROABS 17.6* 5.6  --   --   HGB 14.1 13.1 14.6 14.0  HCT 43.2 40.3 43.0 42  MCV 96.9 96.4  --   --   PLT 255 323  --  251     Assessment/Plan  Dysphagia Is working with SLP team and has been tolerating current diet of regular diet with thin liquids well. Aspiration precautions   Odynophagia As per patient only with medications. Possible pill related odynophagia. Will have her pills be crushed and provided to her. Reassess if no improvement. Reviewed care plan with SLP team  Itching in palms No visible rash. Only new medications is laxative and stool softener. Colace was recently further changed to liquid form. D/c colace for now and reassess. Also to take benadryl 25 mg bid prn for itching. Reassess. On review of allergy, pt is allergic to vicodin, percocer and dilaudid and pt is currently on ultram which is an opioid. D/c tramadol for now  Constipation Has regular bowel movement. Concern of allergic response to the medication- unclear which one. D/c colace for now and reasess. Continue senna s and prn miralax   Family/ staff Communication: reviewed care plan with patient and nursing supervisor    Blanchie Serve, MD  [facility] [phone] (Monday-Friday 8 am - 5 pm) [phone] (afterhours)

## 2015-02-07 ENCOUNTER — Non-Acute Institutional Stay (SKILLED_NURSING_FACILITY): Payer: Medicare Other | Admitting: Nurse Practitioner

## 2015-02-07 DIAGNOSIS — I10 Essential (primary) hypertension: Secondary | ICD-10-CM | POA: Diagnosis not present

## 2015-02-07 DIAGNOSIS — I63512 Cerebral infarction due to unspecified occlusion or stenosis of left middle cerebral artery: Secondary | ICD-10-CM | POA: Diagnosis not present

## 2015-02-07 DIAGNOSIS — I480 Paroxysmal atrial fibrillation: Secondary | ICD-10-CM

## 2015-02-07 DIAGNOSIS — E039 Hypothyroidism, unspecified: Secondary | ICD-10-CM

## 2015-02-07 DIAGNOSIS — R1314 Dysphagia, pharyngoesophageal phase: Secondary | ICD-10-CM | POA: Diagnosis not present

## 2015-02-07 DIAGNOSIS — G2581 Restless legs syndrome: Secondary | ICD-10-CM | POA: Diagnosis not present

## 2015-02-07 DIAGNOSIS — G253 Myoclonus: Secondary | ICD-10-CM

## 2015-02-07 DIAGNOSIS — E43 Unspecified severe protein-calorie malnutrition: Secondary | ICD-10-CM | POA: Diagnosis not present

## 2015-02-07 NOTE — Progress Notes (Signed)
Patient ID: Linda [name], female   DOB: 1929-08-28, 81 y.o.   MRN: [id number]    Nursing Home Location:  [place] of Service: SNF (31)  PCP: [name], MD  Allergies  Allergen Reactions  . Ibuprofen Anaphylaxis    Passed out and had to be hospitalized  . Penicillins     Skin on head was crawling and very very sleepy  . Demerol [Meperidine Hcl] Nausea And Vomiting  . Dilaudid [Hydromorphone Hcl] Nausea And Vomiting  . Lyrica [Pregabalin]     "made me a zombie'  . Morphine And Related Nausea And Vomiting  . Percocet [Oxycodone-Acetaminophen] Nausea And Vomiting  . Vicodin [Hydrocodone-Acetaminophen] Nausea And Vomiting    Chief Complaint  Patient presents with  . Discharge Note    HPI:  Patient is a 81 y.o. female seen today at [facility] for discharge home. She has PMH of RLS, hypothyroidism, osteoporosis, anemia, chronic neck pain among others and is at [facility] for short term rehab. Pt was hospitalized from 12/25/14 - 12/28/14 with acute CVA of left MCA territory involving the insular cortex and operculum. Pt with hx of afib and it was recommended to start her on oral anticoagulation from 12/31/14. her Echocardiogram showed no embolic source, carotid Doppler was negative for significant stenosis. Pt is non verbal but can comprehend and nods yes or no to questions. She has been and plans to cont to work with Speech therapy. Patient currently doing well with therapy, now stable to discharge home with family and home health.  Review of Systems: nods head to answer questions   Review of Systems  Constitutional: Negative for activity change, appetite change and fatigue.  Eyes: Negative.   Respiratory: Negative for cough and shortness of breath.   Cardiovascular: Negative for chest pain and leg swelling.  Gastrointestinal: Negative for abdominal pain, diarrhea and constipation.  Genitourinary: Negative for dysuria and  difficulty urinating.  Musculoskeletal: Negative for myalgias and arthralgias.  Skin: Negative for color change and wound.  Neurological: Negative for dizziness.  Psychiatric/Behavioral: Negative for behavioral problems, confusion and agitation. The patient is not nervous/anxious.     Past Medical History  Diagnosis Date  . History of cardiac catheterization 2002    Negative  . Vertebral fracture   . Restless legs syndrome with nocturnal myoclonus 05/13/2013     [place] controlled   . Abnormal uterine bleeding [id number]  . Hypothyroidism   . Arthritis   . Osteoporosis   . History of stomach ulcers   . History of jaundice as a child   . Anemia   . Head pain, chronic     "I have pain in the back of my head for months"  . Dizziness     TAKES MECLIZINE TO TREAT   Past Surgical History  Procedure Laterality Date  . Back surgery      x 3  . Breast surgery Bilateral     1 lump each breast removed and benign  . Nasal sinus surgery    . Dilation and curettage of uterus  1970  . Catheterization  2003    cardiac cath  . Kyphoplasty  2010  . Cataract extraction Bilateral 2011  . Cholecystectomy  1995    Lap  . Cardiac catheterization  2002    "it was normal" per pt - no record found  . Robotic assisted salpingo oopherectomy Bilateral 11/02/2014    Procedure: ROBOTIC ASSISTED bilateral SALPINGO  OOPHORECTOMY;  Surgeon: Everitt Amber, MD;  Location: [facility];  Service: Gynecology;  Laterality: Bilateral;  . Laparoscopy N/A 11/02/2014    Procedure: LAPAROSCOPY DIAGNOSTIC;  Surgeon: Everitt Amber, MD;  Location: [facility];  Service: Gynecology;  Laterality: N/A;   Social History:   reports that she has never smoked. She has never used smokeless tobacco. She reports that she does not drink alcohol or use illicit drugs.  Family History  Problem Relation Age of Onset  . Asthma Brother   . Osteoarthritis Son   . Hypertension Father   . Stroke Father   . Cancer Brother     stomach     Medications: Patient's Medications  New Prescriptions   No medications on file  Previous Medications   ACETAMINOPHEN (TYLENOL) 500 MG TABLET    Take 1,000 mg by mouth every 6 (six) hours as needed. Usually only takes 2 x a  day   APIXABAN (ELIQUIS) 5 MG TABS TABLET    Take 1 tablet (5 mg total) by mouth 2 (two) times daily. Start on 12/31/14. Please discontinue Aspirin once started on Eliquis!   CALCIUM GLUCONATE 500 MG TABLET    Take 500 mg by mouth 2 (two) times daily. With 1000 units D3   CHOLECALCIFEROL (VITAMIN D) 1000 UNITS TABLET    Take 1,000 Units by mouth 2 (two) times daily. Take with calium   CRANBERRY 200 MG CAPS    Take 1 capsule by mouth daily with lunch.    DENOSUMAB (PROLIA) 60 MG/ML SOLN INJECTION    Inject 60 mg into the skin every 6 (six) months. Administer in upper arm, thigh, or abdomen   FLAXSEED, LINSEED, (FLAX SEED OIL) 1000 MG CAPS    Take 1 capsule by mouth 2 (two) times daily.    LEVOTHYROXINE (SYNTHROID, LEVOTHROID) 50 MCG TABLET    Take 50 mcg by mouth every morning.    MECLIZINE (ANTIVERT) 25 MG TABLET    Take 25 mg by mouth daily as needed for dizziness.    METOPROLOL TARTRATE (LOPRESSOR) 25 MG TABLET    Take 1 tablet (25 mg total) by mouth 2 (two) times daily.   MULTIPLE VITAMINS-MINERALS (CENTRUM SILVER PO)    Take 1 tablet by mouth daily with lunch.    ONDANSETRON (ZOFRAN) 4 MG TABLET    Take 1 tablet (4 mg total) by mouth every 8 (eight) hours as needed for nausea or vomiting.   PANTOPRAZOLE SODIUM (PROTONIX) 40 MG/20 ML PACK    Take 40 mg by mouth daily.   PROPYLENE GLYCOL (SYSTANE BALANCE OP)    Apply 1 drop to eye 2 (two) times daily.   ROPINIROLE (REQUIP) 0.25 MG TABLET    TAKE 1 TABLET BY MOUTH 3 TIMES A DAY   SENNOSIDES-DOCUSATE SODIUM (SENOKOT-S) 8.6-50 MG TABLET    Take 2 tablets by mouth daily.  Modified Medications   No medications on file  Discontinued Medications   DOCUSATE (COLACE) 50 MG/5ML LIQUID    10 mL by mouth twice daily    POLYETHYLENE GLYCOL (MIRALAX / GLYCOLAX) PACKET    Take 17 g by mouth daily as needed.   TRAMADOL (ULTRAM) 50 MG TABLET    Take 1 tablet (50 mg total) by mouth every 8 (eight) hours as needed. For pain     Physical Exam: Filed Vitals:   02/07/15 1618  BP: 128/70  Pulse: 80  Temp: 97 F (36.1 C)  Resp: 18    Physical Exam  Constitutional: She appears well-developed.  No distress.  HENT:  Head: Normocephalic and atraumatic.  Mouth/Throat: Oropharynx is clear and moist. No oropharyngeal exudate.  Eyes: Conjunctivae are normal. Pupils are equal, round, and reactive to light.  Neck: Normal range of motion. Neck supple.  Cardiovascular: Normal rate, regular rhythm and normal heart sounds.   Pulmonary/Chest: Effort normal and breath sounds normal.  Abdominal: Soft. Bowel sounds are normal.  Musculoskeletal: She exhibits no edema or tenderness.  Neurological: She is alert.  Aphagic, right sided hemiparesis    Skin: Skin is warm and dry. She is not diaphoretic.  Psychiatric: She has a normal mood and affect.    Labs reviewed: Basic Metabolic Panel:  Recent Labs  10/29/14 0930 12/25/14 0747 12/25/14 0801 12/27/14 0943 02/02/15 1248  NA 137 140 142 138 139  K 4.6 4.3 4.3 3.3* 4.1  CL 104 107 105 109  --   CO2 25 26  --  22  --   GLUCOSE 142* 111* 104* 121*  --   BUN 30* 16 18 16 19   CREATININE 0.90 1.20* 1.10 0.86 1.0  CALCIUM 10.1 9.5  --  7.7*  --    Liver Function Tests:  Recent Labs  10/29/14 0930 12/25/14 0747 02/02/15 1248  AST 40* 31 28  ALT 43* 27 33  ALKPHOS 104 111 103  BILITOT 0.7 0.6  --   PROT 7.4 6.9  --   ALBUMIN 4.4 3.4*  --    No results for input(s): LIPASE, AMYLASE in the last 8760 hours. No results for input(s): AMMONIA in the last 8760 hours. CBC:  Recent Labs  10/29/14 0930 12/25/14 0747 12/25/14 0801 02/02/15 1248  WBC 19.2* 8.1  --  5.6  NEUTROABS 17.6* 5.6  --   --   HGB 14.1 13.1 14.6 14.0  HCT 43.2 40.3 43.0 42  MCV 96.9  96.4  --   --   PLT 255 323  --  251   TSH: No results for input(s): TSH in the last 8760 hours. A1C: Lab Results  Component Value Date   HGBA1C 5.9* 12/26/2014   Lipid Panel:  Recent Labs  12/26/14 0732  CHOL 113  HDL 49  LDLCALC 52  TRIG 58  CHOLHDL 2.3    Radiological Exams: Dg Chest 2 View  12/25/2014   CLINICAL DATA:  Stroke  EXAM: CHEST  2 VIEW  COMPARISON:  05/27/2009  FINDINGS: Bibasilar atelectasis and effusion. Vascular congestion with probable fluid overload. No significant edema.  Vertebroplasty in the mid thoracic spine two levels. Lungs are hyperinflated.  IMPRESSION: Findings consistent with mild fluid overload with vascular congestion and small bilateral effusions and bibasilar atelectasis.   Electronically Signed   By: Franchot Gallo M.D.   On: 12/25/2014 14:13   Ct Head Wo Contrast  12/25/2014   CLINICAL DATA:  [facility]- pt coming from home with stroke like symptoms. LSN at 9pm last night. Pt with right side weakness, right side facial droop, pt is non-verbal which is abnormal for her. Pt able to answer questions with head gestures. H/o anemia and chronic headaches.  EXAM: CT HEAD WITHOUT CONTRAST  TECHNIQUE: Contiguous axial images were obtained from the base of the skull through the vertex without intravenous contrast.  COMPARISON:  11/15/2011  FINDINGS: There is loss of the gray-white junction noted along the posterior left frontal lobe extending to the left temporal lobe. This is consistent with recent infarction.  There is no other evidence of a recent cortical infarct.  There are no parenchymal  masses or mass effect. Mild periventricular white matter hypoattenuation is noted consistent with stable chronic microvascular ischemic change.  There are no extra-axial masses or abnormal fluid collections.  Ventricles are normal configuration. There is ventricular enlargement consistent with atrophy, similar to the prior exam. No convincing hydrocephalus.  There is no  intracranial hemorrhage.  Sinuses and mastoid air cells are clear.  No skull lesion.  IMPRESSION: 1. Recent, likely early subacute, left middle cerebral artery territory infarct involving the posterior left frontal lobe and left temporal lobe. 2. No other acute or recent abnormality. No other evidence of an infarct. 3. No intracranial hemorrhage.   Electronically Signed   By: Lajean Manes M.D.   On: 12/25/2014 08:09   Mr Brain Wo Contrast  12/25/2014   CLINICAL DATA:  Aphasia. Stroke. Right-sided weakness. Abnormal CT of the head  EXAM: MRI HEAD WITHOUT CONTRAST  MRA HEAD WITHOUT CONTRAST  TECHNIQUE: Multiplanar, multiecho pulse sequences of the brain and surrounding structures were obtained without intravenous contrast. Angiographic images of the head were obtained using MRA technique without contrast.  COMPARISON:  CT head from the same day.  FINDINGS: MRI HEAD FINDINGS  A left MCA territory infarct is confirmed. This involves the insular cortex and operculum. DWI signal in the anterior right frontal lobe represents T2 shine through from a remote or subacute infarct.  T2 changes are associated with remote anterior right frontal lobe infarct. There is minimal subtle change within the acute left MCA territory infarct. Moderate generalized atrophy is present. The ventricles are proportionate to the degree of atrophy.  Flow is present in the major intracranial arteries. Bilateral lens replacements are present.  Mild mucosal thickening is scattered throughout the ethmoid air cells. The mastoid air cells are clear. The remaining paranasal sinuses are clear. The skullbase is unremarkable. Midline structures are within normal limits.  MRA HEAD FINDINGS  The MRA is degraded by patient motion. Internal carotid arteries are within normal limits from the high cervical segments through the ICA termini. The A1 and M1 segments are normal. The anterior communicating artery is patent. The MCA bifurcations are intact. The  anterior left M2 segment is occluded approximately 10 mm from its origin. There is asymmetric attenuation of left MCA branch vessels compared to the right. The ACA branch vessels are within normal limits.  The left vertebral artery is dominant. The right vertebral artery terminates at the PICA. The left PICA is not visualized. A left AICA is present. The left PCA originates from the basilar tip. The right PCA is of fetal type.  IMPRESSION: 1. Acute nonhemorrhagic left MCA territory infarct involving the insular cortex and operculum. 2. Remote anterior left right frontal lobe infarct. 3. Moderate generalized atrophy. 4. The MRA demonstrates occlusion of the anterior left M2 division, likely associated with the acute infarct. 5. Mild distal small vessel disease. 6. No other significant proximal stenosis, aneurysm, or branch vessel occlusion.   Electronically Signed   By: San Morelle M.D.   On: 12/25/2014 14:21   Mr Jodene Nam Head/brain Wo Cm  12/25/2014   CLINICAL DATA:  Aphasia. Stroke. Right-sided weakness. Abnormal CT of the head  EXAM: MRI HEAD WITHOUT CONTRAST  MRA HEAD WITHOUT CONTRAST  TECHNIQUE: Multiplanar, multiecho pulse sequences of the brain and surrounding structures were obtained without intravenous contrast. Angiographic images of the head were obtained using MRA technique without contrast.  COMPARISON:  CT head from the same day.  FINDINGS: MRI HEAD FINDINGS  A left MCA territory infarct is  confirmed. This involves the insular cortex and operculum. DWI signal in the anterior right frontal lobe represents T2 shine through from a remote or subacute infarct.  T2 changes are associated with remote anterior right frontal lobe infarct. There is minimal subtle change within the acute left MCA territory infarct. Moderate generalized atrophy is present. The ventricles are proportionate to the degree of atrophy.  Flow is present in the major intracranial arteries. Bilateral lens replacements are present.   Mild mucosal thickening is scattered throughout the ethmoid air cells. The mastoid air cells are clear. The remaining paranasal sinuses are clear. The skullbase is unremarkable. Midline structures are within normal limits.  MRA HEAD FINDINGS  The MRA is degraded by patient motion. Internal carotid arteries are within normal limits from the high cervical segments through the ICA termini. The A1 and M1 segments are normal. The anterior communicating artery is patent. The MCA bifurcations are intact. The anterior left M2 segment is occluded approximately 10 mm from its origin. There is asymmetric attenuation of left MCA branch vessels compared to the right. The ACA branch vessels are within normal limits.  The left vertebral artery is dominant. The right vertebral artery terminates at the PICA. The left PICA is not visualized. A left AICA is present. The left PCA originates from the basilar tip. The right PCA is of fetal type.  IMPRESSION: 1. Acute nonhemorrhagic left MCA territory infarct involving the insular cortex and operculum. 2. Remote anterior left right frontal lobe infarct. 3. Moderate generalized atrophy. 4. The MRA demonstrates occlusion of the anterior left M2 division, likely associated with the acute infarct. 5. Mild distal small vessel disease. 6. No other significant proximal stenosis, aneurysm, or branch vessel occlusion.   Electronically Signed   By: San Morelle M.D.   On: 12/25/2014 14:21   Assessment/Plan 1. Paroxysmal atrial fibrillation Rate controlled, on metoprolol and no conts on eliquis due to CVA  2. Essential hypertension -controlled on current regimen  3. Dysphagia, pharyngoesophageal phase Post CVA, conts to work with ST, conts on aspiration precautions and dysphagia diet  4. Hypothyroidism, unspecified hypothyroidism type Remains on synrhoid 50 mcg daily  5. Restless legs syndrome with nocturnal myoclonus Remains on ropinirole 0.25 mg TID  6. Protein-calorie  malnutrition, severe To cont on supplement, will need further outpatient monitoring   7. Left middle cerebral artery stroke Left acute arterial ischemic stroke which is why she is here. Has right sided hemiparesis, dysphagia and aphasia. Pt will need to cont to work with SLP. On nectar thick liquids and pureed food. Aspiration precautions in place and to continue on discharge Pt needs some assistance with ADLs. Has benefited from PT/OT with gait training and muscle strengthening. pt is stable for discharge-will need PT/OT/ST/Nursing aide per home health. No DME needed. Rx written.  will need to follow up with PCP within 2 weeks.

## 2015-02-09 ENCOUNTER — Encounter: Payer: Self-pay | Admitting: Neurology

## 2015-02-09 ENCOUNTER — Ambulatory Visit (INDEPENDENT_AMBULATORY_CARE_PROVIDER_SITE_OTHER): Payer: Medicare Other | Admitting: Neurology

## 2015-02-09 VITALS — BP 144/96 | HR 104 | Wt 98.8 lb

## 2015-02-09 DIAGNOSIS — I69328 Other speech and language deficits following cerebral infarction: Secondary | ICD-10-CM | POA: Diagnosis not present

## 2015-02-09 DIAGNOSIS — I639 Cerebral infarction, unspecified: Secondary | ICD-10-CM | POA: Diagnosis not present

## 2015-02-09 DIAGNOSIS — I69359 Hemiplegia and hemiparesis following cerebral infarction affecting unspecified side: Secondary | ICD-10-CM

## 2015-02-09 NOTE — Progress Notes (Signed)
Reason for visit: Stroke follow-up  Linda [name] is an 81 y.o. female  History of present illness:  Linda [name] is a 81 year old right-handed white female with a history of a stroke event that occurred on 12/25/2014. 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 The patient has had a resultant mild right hemiparesis, but her main deficit is an aphasia syndrome that affects the expressive component more than the afferent component of language. The patient is unable to read or write. She has been in an extended care facility, she is going home today. She is accompanied by her son. He indicates that the patient is no longer able to manage her financial, legal, and medical affairs secondary to the language deficit. They require a letter indicating this that may allow him to access funds from a trust to help her get the therapy that she needs following the hospitalization. The patient was operating a motor vehicle prior to the stroke, she was using a cane for ambulation before. Now, she is unable to drive, and she uses a walker for mobility. She is followed by Dr. Wynonia Lawman from cardiology. The 2-D echocardiogram in the hospital was relatively unremarkable, as was a carotid Doppler study. MRI confirmed the left middle cerebral artery distribution stroke.  Past Medical History  Diagnosis Date  . History of cardiac catheterization 2002    Negative  . Vertebral fracture   . Restless legs syndrome with nocturnal myoclonus 05/13/2013     [place] controlled   . Abnormal uterine bleeding [id number]  . Hypothyroidism   . Arthritis   . Osteoporosis   . History of stomach ulcers   . History of jaundice as a child   . Anemia   . Head pain, chronic     "I have pain in the back of my head for months"  . Dizziness     TAKES MECLIZINE TO TREAT  . Hemiparesis and speech and language deficit as late effects of stroke  02/09/2015    Past Surgical History  Procedure Laterality Date  . Back surgery      x 3  . Breast surgery Bilateral     1 lump each breast removed and benign  . Nasal sinus surgery    . Dilation and curettage of uterus  1970  . Catheterization  2003    cardiac cath  . Kyphoplasty  2010  . Cataract extraction Bilateral 2011  . Cholecystectomy  1995    Lap  . Cardiac catheterization  2002    "it was normal" per pt - no record found  . Robotic assisted salpingo oopherectomy Bilateral 11/02/2014    Procedure: ROBOTIC ASSISTED bilateral SALPINGO OOPHORECTOMY;  Surgeon: Everitt Amber, MD;  Location: [facility];  Service: Gynecology;  Laterality: Bilateral;  . Laparoscopy N/A 11/02/2014    Procedure: LAPAROSCOPY DIAGNOSTIC;  Surgeon: Everitt Amber, MD;  Location: [facility];  Service: Gynecology;  Laterality: N/A;    Family History  Problem Relation Age of Onset  . Asthma Brother   . Osteoarthritis Son   . Hypertension Father   . Stroke Father   . Cancer Brother     stomach    Social history:  reports that she has never smoked. She has never used smokeless tobacco. She reports that she does not drink alcohol or use illicit drugs.    Allergies  Allergen Reactions  . Ibuprofen Anaphylaxis    Passed  out and had to be hospitalized  . Penicillins     Skin on head was crawling and very very sleepy  . Demerol [Meperidine Hcl] Nausea And Vomiting  . Dilaudid [Hydromorphone Hcl] Nausea And Vomiting  . Fentanyl   . Lidoderm [Lidocaine]   . Lyrica [Pregabalin]     "made me a zombie'  . Morphine And Related Nausea And Vomiting  . Other     hamburger  . Percocet [Oxycodone-Acetaminophen] Nausea And Vomiting  . Tramadol   . Vicodin [Hydrocodone-Acetaminophen] Nausea And Vomiting    Medications:  Prior to Admission medications   Medication Sig Start Date End Date Taking? Authorizing Provider  acetaminophen (TYLENOL) 500 MG tablet Take 1,000 mg by mouth every 6 (six) hours as needed. Usually only  takes 2 x a  day   Yes Historical Provider, MD  apixaban (ELIQUIS) 5 MG TABS tablet Take 1 tablet (5 mg total) by mouth 2 (two) times daily. Start on 12/31/14. Please discontinue Aspirin once started on Eliquis! 12/31/14  Yes Shanker Kristeen Mans, MD  aspirin 325 MG tablet Take 325 mg by mouth daily.   Yes Historical Provider, MD  calcium gluconate 500 MG tablet Take 500 mg by mouth 2 (two) times daily. With 1000 units D3   Yes Historical Provider, MD  cholecalciferol (VITAMIN D) 1000 UNITS tablet Take 1,000 Units by mouth 2 (two) times daily. Take with calium   Yes Historical Provider, MD  Cranberry 200 MG CAPS Take 1 capsule by mouth daily with lunch.    Yes Historical Provider, MD  denosumab (PROLIA) 60 MG/ML SOLN injection Inject 60 mg into the skin every 6 (six) months. Administer in upper arm, thigh, or abdomen   Yes Historical Provider, MD  diphenhydrAMINE (SOMINEX) 25 MG tablet Take 25 mg by mouth at bedtime as needed for sleep.   Yes Historical Provider, MD  Flaxseed, Linseed, (FLAX SEED OIL) 1000 MG CAPS Take 1 capsule by mouth 2 (two) times daily.    Yes Historical Provider, MD  levothyroxine (SYNTHROID, LEVOTHROID) 50 MCG tablet Take 50 mcg by mouth every morning.    Yes Historical Provider, MD  meclizine (ANTIVERT) 25 MG tablet Take 25 mg by mouth daily as needed for dizziness.  05/07/13  Yes Historical Provider, MD  metoprolol tartrate (LOPRESSOR) 25 MG tablet Take 1 tablet (25 mg total) by mouth 2 (two) times daily. 12/28/14  Yes Shanker Kristeen Mans, MD  Multiple Vitamins-Minerals (CENTRUM SILVER PO) Take 1 tablet by mouth daily with lunch.    Yes Historical Provider, MD  ondansetron (ZOFRAN) 4 MG tablet Take 1 tablet (4 mg total) by mouth every 8 (eight) hours as needed for nausea or vomiting. 11/02/14  Yes Lahoma Crocker, MD  pantoprazole sodium (PROTONIX) 40 mg/20 mL PACK Take 40 mg by mouth daily.   Yes Historical Provider, MD  Propylene Glycol (SYSTANE BALANCE OP) Apply 1 drop to eye 2  (two) times daily.   Yes Historical Provider, MD  rOPINIRole (REQUIP) 0.25 MG tablet TAKE 1 TABLET BY MOUTH 3 TIMES A DAY 09/10/14  Yes Asencion Partridge Dohmeier, MD  sennosides-docusate sodium (SENOKOT-S) 8.6-50 MG tablet Take 2 tablets by mouth daily.   Yes Historical Provider, MD    ROS:  Out of a complete 14 system review of symptoms, the patient complains only of the following symptoms, and all other reviewed systems are negative.  Aphasia Gait disturbance  Blood pressure 144/96, pulse 104, weight 98 lb 12.8 oz (44.815 kg), last menstrual period 09/03/1996.  Physical Exam  General: The patient is alert and cooperative at the time of the examination.  Skin: No significant peripheral edema is noted.   Neurologic Exam  Mental status: The patient is alert and cooperative, the patient remains aphasic, able to follow some verbal commands. The patient is unable to read or write.   Cranial nerves: Facial symmetry is present. Speech is aphasic, the patient is able to repeat some simple phrases, she makes paraphasic errors. Extraocular movements are full. Visual fields are full.  Motor: The patient has good strength in all 4 extremities.  Sensory examination: Soft touch sensation is symmetric on the arms and legs, but there is some decreased soft touch sensation on the right base relative to the left.  Coordination: The patient has good finger-nose-finger and heel-to-shin bilaterally.  Gait and station: The patient has a slightly wide-based gait. The patient has good stability while using the walker. Tandem gait was not attempted.  Reflexes: Deep tendon reflexes are symmetric.   MRI brain/MRA head 12/25/14:  IMPRESSION: 1. Acute nonhemorrhagic left MCA territory infarct involving the insular cortex and operculum. 2. Remote anterior left right frontal lobe infarct. 3. Moderate generalized atrophy. 4. The MRA demonstrates occlusion of the anterior left M2 division, likely associated with  the acute infarct. 5. Mild distal small vessel disease. 6. No other significant proximal stenosis, aneurysm, or branch vessel occlusion.  * MRI scan images were reviewed online. I agree with the written report.   Carotid Doppler 12/27/14:  Summary:  - The vertebral arteries appear patent with antegrade flow. - Findings consistent with 1-39 percent stenosis involving the right internal carotid artery and the left internal carotid artery.   2D echo 12/27/14:  Study Conclusions  - Left ventricle: The cavity size was normal. There was mild focal basal hypertrophy of the septum. Systolic function was normal. The estimated ejection fraction was in the range of 55% to 60%. Wall motion was normal; there were no regional wall motion abnormalities. - Mitral valve: Calcified annulus.  Impressions:  - Normal LV function; trace MR and mild TR.    Assessment/Plan:  1. Atrial fibrillation  2. Left middle cerebral artery distribution stroke, embolic  The patient has sustained a recent stroke in the left middle cerebral artery distribution. The patient currently is on anticoagulation, this will need to be continued. The patient may require some ongoing speech therapy as an outpatient. She has returned to living at home with her son. She may require 24/7 supervision for the next several weeks or months. She has a visit scheduled in September 2016, she will keep that appointment. She is to remain on Eliquis.   Jill Alexanders MD 02/09/2015 8:01 PM  [facility] [address] [place], [place] [address]  Phone [phone] Fax [phone]

## 2015-02-09 NOTE — Patient Instructions (Signed)
Stroke Prevention Some medical conditions and behaviors are associated with an increased chance of having a stroke. You may prevent a stroke by making healthy choices and managing medical conditions. HOW CAN I REDUCE MY RISK OF HAVING A STROKE?   Stay physically active. Get at least 30 minutes of activity on most or all days.  Do not smoke. It may also be helpful to avoid exposure to secondhand smoke.  Limit alcohol use. Moderate alcohol use is considered to be:  No more than 2 drinks per day for men.  No more than 1 drink per day for nonpregnant women.  Eat healthy foods. This involves:  Eating 5 or more servings of fruits and vegetables a day.  Making dietary changes that address high blood pressure (hypertension), high cholesterol, diabetes, or obesity.  Manage your cholesterol levels.  Making food choices that are high in fiber and low in saturated fat, trans fat, and cholesterol may control cholesterol levels.  Take any prescribed medicines to control cholesterol as directed by your health care provider.  Manage your diabetes.  Controlling your carbohydrate and sugar intake is recommended to manage diabetes.  Take any prescribed medicines to control diabetes as directed by your health care provider.  Control your hypertension.  Making food choices that are low in salt (sodium), saturated fat, trans fat, and cholesterol is recommended to manage hypertension.  Take any prescribed medicines to control hypertension as directed by your health care provider.  Maintain a healthy weight.  Reducing calorie intake and making food choices that are low in sodium, saturated fat, trans fat, and cholesterol are recommended to manage weight.  Stop drug abuse.  Avoid taking birth control pills.  Talk to your health care provider about the risks of taking birth control pills if you are over 35 years old, smoke, get migraines, or have ever had a blood clot.  Get evaluated for sleep  disorders (sleep apnea).  Talk to your health care provider about getting a sleep evaluation if you snore a lot or have excessive sleepiness.  Take medicines only as directed by your health care provider.  For some people, aspirin or blood thinners (anticoagulants) are helpful in reducing the risk of forming abnormal blood clots that can lead to stroke. If you have the irregular heart rhythm of atrial fibrillation, you should be on a blood thinner unless there is a good reason you cannot take them.  Understand all your medicine instructions.  Make sure that other conditions (such as anemia or atherosclerosis) are addressed. SEEK IMMEDIATE MEDICAL CARE IF:   You have sudden weakness or numbness of the face, arm, or leg, especially on one side of the body.  Your face or eyelid droops to one side.  You have sudden confusion.  You have trouble speaking (aphasia) or understanding.  You have sudden trouble seeing in one or both eyes.  You have sudden trouble walking.  You have dizziness.  You have a loss of balance or coordination.  You have a sudden, severe headache with no known cause.  You have new chest pain or an irregular heartbeat. Any of these symptoms may represent a serious problem that is an emergency. Do not wait to see if the symptoms will go away. Get medical help at once. Call your local emergency services (911 in [place] Do not drive yourself to the hospital. Document Released: 09/27/2004 Document Revised: 01/04/2014 Document Reviewed: 02/20/2013 [employer] Patient Information 2015 [employer]. This information is not intended to replace advice given   to you by your health care provider. Make sure you discuss any questions you have with your health care provider.  

## 2015-02-11 ENCOUNTER — Other Ambulatory Visit: Payer: Self-pay

## 2015-02-11 DIAGNOSIS — Z8673 Personal history of transient ischemic attack (TIA), and cerebral infarction without residual deficits: Secondary | ICD-10-CM | POA: Diagnosis not present

## 2015-02-11 DIAGNOSIS — M810 Age-related osteoporosis without current pathological fracture: Secondary | ICD-10-CM | POA: Diagnosis not present

## 2015-02-11 DIAGNOSIS — M545 Low back pain: Secondary | ICD-10-CM | POA: Diagnosis not present

## 2015-02-11 DIAGNOSIS — Z8781 Personal history of (healed) traumatic fracture: Secondary | ICD-10-CM | POA: Diagnosis not present

## 2015-02-11 MED ORDER — PANTOPRAZOLE SODIUM 40 MG PO PACK: 40.0000 mg | PACK | Freq: Every day | ORAL | Status: DC

## 2015-02-11 NOTE — Telephone Encounter (Signed)
[facility] 

## 2015-02-13 ENCOUNTER — Encounter: Payer: Self-pay | Admitting: Neurology

## 2015-02-14 NOTE — Telephone Encounter (Signed)
I called Linda [name] and left a voicemail.

## 2015-02-15 ENCOUNTER — Telehealth: Payer: Self-pay | Admitting: Neurology

## 2015-02-15 ENCOUNTER — Other Ambulatory Visit: Payer: Self-pay

## 2015-02-15 MED ORDER — APIXABAN 5 MG PO TABS: 5.0000 mg | ORAL_TABLET | Freq: Two times a day (BID) | ORAL | Status: DC

## 2015-02-15 NOTE — Telephone Encounter (Signed)
I called John and left a voicemail.

## 2015-02-15 NOTE — Telephone Encounter (Signed)
Dr Jannifer Franklin Josem Kaufmann refills on this Rx.  I called back.  They are aware.

## 2015-02-15 NOTE — Telephone Encounter (Signed)
I called Linda [name]. I initially advised him to contact the prescribing doctor for the Eliquis. However, I discovered while talking to Linda [name] that said doctor is a hospitalist in the hospital. I informed Linda [name] that I would forward the message to our pharmacy tech to see what we could do to help. MyChart message has been forwarded to Linda [name].

## 2015-02-15 NOTE — Telephone Encounter (Signed)
Patient returned [place] RN's call. Please call and advise.

## 2015-02-15 NOTE — Telephone Encounter (Signed)
Patient is requesting we start prescribing Eliquis.  Last written by hospital.  Forwarded to provider for review.

## 2015-02-18 ENCOUNTER — Emergency Department (HOSPITAL_COMMUNITY): Payer: Medicare Other

## 2015-02-18 ENCOUNTER — Encounter (HOSPITAL_COMMUNITY): Payer: Self-pay | Admitting: Emergency Medicine

## 2015-02-18 ENCOUNTER — Inpatient Hospital Stay (HOSPITAL_COMMUNITY)
Admission: EM | Admit: 2015-02-18 | Discharge: 2015-02-20 | DRG: 065 | Disposition: A | Discharge status: Home Health | Payer: Medicare Other | Attending: Internal Medicine | Admitting: Internal Medicine

## 2015-02-18 DIAGNOSIS — I63519 Cerebral infarction due to unspecified occlusion or stenosis of unspecified middle cerebral artery: Principal | ICD-10-CM | POA: Diagnosis present

## 2015-02-18 DIAGNOSIS — E039 Hypothyroidism, unspecified: Secondary | ICD-10-CM | POA: Diagnosis not present

## 2015-02-18 DIAGNOSIS — M810 Age-related osteoporosis without current pathological fracture: Secondary | ICD-10-CM | POA: Diagnosis present

## 2015-02-18 DIAGNOSIS — Z8673 Personal history of transient ischemic attack (TIA), and cerebral infarction without residual deficits: Secondary | ICD-10-CM

## 2015-02-18 DIAGNOSIS — G2581 Restless legs syndrome: Secondary | ICD-10-CM | POA: Diagnosis present

## 2015-02-18 DIAGNOSIS — G8191 Hemiplegia, unspecified affecting right dominant side: Secondary | ICD-10-CM | POA: Diagnosis present

## 2015-02-18 DIAGNOSIS — I63512 Cerebral infarction due to unspecified occlusion or stenosis of left middle cerebral artery: Secondary | ICD-10-CM | POA: Diagnosis not present

## 2015-02-18 DIAGNOSIS — Z66 Do not resuscitate: Secondary | ICD-10-CM | POA: Diagnosis present

## 2015-02-18 DIAGNOSIS — M6281 Muscle weakness (generalized): Secondary | ICD-10-CM | POA: Diagnosis not present

## 2015-02-18 DIAGNOSIS — Z9841 Cataract extraction status, right eye: Secondary | ICD-10-CM | POA: Diagnosis not present

## 2015-02-18 DIAGNOSIS — R200 Anesthesia of skin: Secondary | ICD-10-CM | POA: Diagnosis not present

## 2015-02-18 DIAGNOSIS — I63511 Cerebral infarction due to unspecified occlusion or stenosis of right middle cerebral artery: Secondary | ICD-10-CM | POA: Diagnosis not present

## 2015-02-18 DIAGNOSIS — I69359 Hemiplegia and hemiparesis following cerebral infarction affecting unspecified side: Secondary | ICD-10-CM

## 2015-02-18 DIAGNOSIS — M1990 Unspecified osteoarthritis, unspecified site: Secondary | ICD-10-CM | POA: Diagnosis present

## 2015-02-18 DIAGNOSIS — R29898 Other symptoms and signs involving the musculoskeletal system: Secondary | ICD-10-CM

## 2015-02-18 DIAGNOSIS — Z9842 Cataract extraction status, left eye: Secondary | ICD-10-CM | POA: Diagnosis not present

## 2015-02-18 DIAGNOSIS — I69328 Other speech and language deficits following cerebral infarction: Secondary | ICD-10-CM

## 2015-02-18 DIAGNOSIS — R4701 Aphasia: Secondary | ICD-10-CM | POA: Diagnosis present

## 2015-02-18 DIAGNOSIS — Z79899 Other long term (current) drug therapy: Secondary | ICD-10-CM | POA: Diagnosis not present

## 2015-02-18 DIAGNOSIS — I639 Cerebral infarction, unspecified: Secondary | ICD-10-CM | POA: Diagnosis not present

## 2015-02-18 DIAGNOSIS — I480 Paroxysmal atrial fibrillation: Secondary | ICD-10-CM | POA: Diagnosis present

## 2015-02-18 DIAGNOSIS — Z7901 Long term (current) use of anticoagulants: Secondary | ICD-10-CM

## 2015-02-18 DIAGNOSIS — R202 Paresthesia of skin: Secondary | ICD-10-CM

## 2015-02-18 DIAGNOSIS — I10 Essential (primary) hypertension: Secondary | ICD-10-CM | POA: Diagnosis present

## 2015-02-18 LAB — COMPREHENSIVE METABOLIC PANEL
ALBUMIN: 3.6 g/dL (ref 3.5–5.0)
ALT: 31 U/L (ref 14–54)
AST: 31 U/L (ref 15–41)
Alkaline Phosphatase: 109 U/L (ref 38–126)
Anion gap: 8 (ref 5–15)
BUN: 23 mg/dL — ABNORMAL HIGH (ref 6–20)
CALCIUM: 9.8 mg/dL (ref 8.9–10.3)
CO2: 30 mmol/L (ref 22–32)
CREATININE: 1.17 mg/dL — AB (ref 0.44–1.00)
Chloride: 102 mmol/L (ref 101–111)
GFR calc Af Amer: 48 mL/min — ABNORMAL LOW (ref >60)
GFR calc non Af Amer: 42 mL/min — ABNORMAL LOW (ref >60)
Glucose, Bld: 95 mg/dL (ref 65–99)
Potassium: 4.2 mmol/L (ref 3.5–5.1)
SODIUM: 140 mmol/L (ref 135–145)
TOTAL PROTEIN: 6.7 g/dL (ref 6.5–8.1)
Total Bilirubin: 0.5 mg/dL (ref 0.3–1.2)

## 2015-02-18 LAB — CBC WITH DIFFERENTIAL/PLATELET
Basophils Absolute: 0.1 10*3/uL (ref 0.0–0.1)
Basophils Relative: 1 % (ref 0–1)
EOS ABS: 0.3 10*3/uL (ref 0.0–0.7)
Eosinophils Relative: 4 % (ref 0–5)
HCT: 48.7 % — ABNORMAL HIGH (ref 36.0–46.0)
Hemoglobin: 15.6 g/dL — ABNORMAL HIGH (ref 12.0–15.0)
LYMPHS ABS: 2.2 10*3/uL (ref 0.7–4.0)
Lymphocytes Relative: 29 % (ref 12–46)
MCH: 30.8 pg (ref 26.0–34.0)
MCHC: 32 g/dL (ref 30.0–36.0)
MCV: 96.1 fL (ref 78.0–100.0)
Monocytes Absolute: 0.7 10*3/uL (ref 0.1–1.0)
Monocytes Relative: 9 % (ref 3–12)
Neutro Abs: 4.4 10*3/uL (ref 1.7–7.7)
Neutrophils Relative %: 57 % (ref 43–77)
PLATELETS: 271 10*3/uL (ref 150–400)
RBC: 5.07 MIL/uL (ref 3.87–5.11)
RDW: 13.5 % (ref 11.5–15.5)
WBC: 7.7 10*3/uL (ref 4.0–10.5)

## 2015-02-18 MED ORDER — ROPINIROLE HCL 0.25 MG PO TABS
0.2500 mg | ORAL_TABLET | Freq: Three times a day (TID) | ORAL | Status: DC
  Administered 2015-02-19 – 2015-02-20 (×4): 0.25 mg via ORAL
  Filled 2015-02-18 (×7): qty 1

## 2015-02-18 MED ORDER — APIXABAN 5 MG PO TABS: 5.0000 mg | ORAL_TABLET | Freq: Two times a day (BID) | ORAL | Status: DC

## 2015-02-18 MED ORDER — STROKE: EARLY STAGES OF RECOVERY BOOK
Freq: Once | Status: AC
  Administered 2015-02-19
  Filled 2015-02-18: qty 1

## 2015-02-18 MED ORDER — LEVOTHYROXINE SODIUM 50 MCG PO TABS
50.0000 ug | ORAL_TABLET | Freq: Every day | ORAL | Status: DC
  Administered 2015-02-19 – 2015-02-20 (×2): 50 ug via ORAL
  Filled 2015-02-18 (×2): qty 1

## 2015-02-18 NOTE — ED Notes (Signed)
Dr. patel at bedside

## 2015-02-18 NOTE — H&P (Signed)
[facility] History and Physical  Patient: Linda [name]  MRN: [id number]  DOB: [birth year]  DOS: the patient was seen and examined on 02/18/2015 PCP: [name], MD  Referring physician: Dr. Reather Converse Chief Complaint: Right-sided weakness and numbness  HPI: Linda [name] is a 81 y.o. female with Past medical history of recent CVA of the left MCA territory, A. fib, hypothyroidism. The patient was brought in by family today as she was found to have some increasing numbness of the right side involving both upper and lower leg. She also has numbness of both lower leg. She also had some speech difficulties as well. No fall no trauma no injury reported. The patient was discharged from nursing home recently. The patient was recently hospitalized for CVA and was discharged to nursing home. He reportedly by family the patient's eliquis was not available from the pharmacy due to insurance issues and she was also not taking any aspirin. No nausea no vomiting or loss of control of bowel or bladder reported. The patient denies any burning urination. No other changes in her medications reported as well.  The patient is coming from home. And at her baseline independent for most of her ADL.  Review of Systems: as mentioned in the history of present illness.  A comprehensive review of the other systems is negative.  Past Medical History  Diagnosis Date  . History of cardiac catheterization 2002    Negative  . Vertebral fracture   . Restless legs syndrome with nocturnal myoclonus 05/13/2013     requip controlled   . Abnormal uterine bleeding 7989,2119  . Hypothyroidism   . Arthritis   . Osteoporosis   . History of stomach ulcers   . History of jaundice as a child   . Anemia   . Head pain, chronic     "I have pain in the back of my head for months"  . Dizziness     TAKES MECLIZINE TO TREAT  . Hemiparesis and speech and language deficit as late effects of stroke 02/09/2015     Past Surgical History  Procedure Laterality Date  . Back surgery      x 3  . Breast surgery Bilateral     1 lump each breast removed and benign  . Nasal sinus surgery    . Dilation and curettage of uterus  1970  . Catheterization  2003    cardiac cath  . Kyphoplasty  2010  . Cataract extraction Bilateral 2011  . Cholecystectomy  1995    Lap  . Cardiac catheterization  2002    "it was normal" per pt - no record found  . Robotic assisted salpingo oopherectomy Bilateral 11/02/2014    Procedure: ROBOTIC ASSISTED bilateral SALPINGO OOPHORECTOMY;  Surgeon: Everitt Amber, MD;  Location: [facility];  Service: Gynecology;  Laterality: Bilateral;  . Laparoscopy N/A 11/02/2014    Procedure: LAPAROSCOPY DIAGNOSTIC;  Surgeon: Everitt Amber, MD;  Location: [facility];  Service: Gynecology;  Laterality: N/A;   Social History:  reports that she has never smoked. She has never used smokeless tobacco. She reports that she does not drink alcohol or use illicit drugs.  Allergies  Allergen Reactions  . Ibuprofen Anaphylaxis    Passed out and had to be hospitalized  . Penicillins     Skin on head was crawling and very very sleepy  . Demerol [Meperidine Hcl] Nausea And Vomiting  . Dilaudid [Hydromorphone Hcl] Nausea And Vomiting  . Fentanyl   . Lidoderm [Lidocaine]   .  Lyrica [Pregabalin]     "made me a zombie'  . Morphine And Related Nausea And Vomiting  . Other     hamburger  . Percocet [Oxycodone-Acetaminophen] Nausea And Vomiting  . Tramadol   . Vicodin [Hydrocodone-Acetaminophen] Nausea And Vomiting    Family History  Problem Relation Age of Onset  . Asthma Brother   . Osteoarthritis Son   . Hypertension Father   . Stroke Father   . Cancer Brother     stomach    Prior to Admission medications   Medication Sig Start Date End Date Taking? Authorizing Provider  acetaminophen (TYLENOL) 500 MG tablet Take 1,000 mg by mouth every 6 (six) hours as needed. Usually only takes 2 x a  day   Yes  Historical Provider, MD  apixaban (ELIQUIS) 5 MG TABS tablet Take 1 tablet (5 mg total) by mouth 2 (two) times daily. Please discontinue Aspirin once started on Eliquis! 02/15/15  Yes Kathrynn Ducking, MD  calcium gluconate 500 MG tablet Take 500 mg by mouth 2 (two) times daily. With 1000 units D3   Yes Historical Provider, MD  cholecalciferol (VITAMIN D) 1000 UNITS tablet Take 1,000 Units by mouth 2 (two) times daily. Take with calium   Yes Historical Provider, MD  Cranberry 200 MG CAPS Take 1 capsule by mouth daily with lunch.    Yes Historical Provider, MD  denosumab (PROLIA) 60 MG/ML SOLN injection Inject 60 mg into the skin every 6 (six) months. Administer in upper arm, thigh, or abdomen   Yes Historical Provider, MD  diphenhydrAMINE (SOMINEX) 25 MG tablet Take 25 mg by mouth at bedtime as needed for sleep.   Yes Historical Provider, MD  Flaxseed, Linseed, (FLAX SEED OIL) 1000 MG CAPS Take 1 capsule by mouth 2 (two) times daily.    Yes Historical Provider, MD  hyoscyamine (ANASPAZ) 0.125 MG TBDP disintergrating tablet Place 0.125 mg under the tongue 2 (two) times daily as needed for bladder spasms.   Yes Historical Provider, MD  levothyroxine (SYNTHROID, LEVOTHROID) 50 MCG tablet Take 50 mcg by mouth every morning.    Yes Historical Provider, MD  meclizine (ANTIVERT) 25 MG tablet Take 25 mg by mouth daily as needed for dizziness.  05/07/13  Yes Historical Provider, MD  metoprolol succinate (TOPROL-XL) 25 MG 24 hr tablet Take 25 mg by mouth daily.   Yes Historical Provider, MD  Multiple Vitamins-Minerals (CENTRUM SILVER PO) Take 1 tablet by mouth daily with lunch.    Yes Historical Provider, MD  ondansetron (ZOFRAN) 4 MG tablet Take 1 tablet (4 mg total) by mouth every 8 (eight) hours as needed for nausea or vomiting. 11/02/14  Yes Lahoma Crocker, MD  pantoprazole sodium (PROTONIX) 40 mg/20 mL PACK Take 20 mLs (40 mg total) by mouth daily. 02/11/15  Yes Gildardo Cranker, DO  Propylene Glycol (SYSTANE  BALANCE OP) Apply 1 drop to eye 2 (two) times daily.   Yes Historical Provider, MD  rOPINIRole (REQUIP) 0.25 MG tablet TAKE 1 TABLET BY MOUTH 3 TIMES A DAY 09/10/14  Yes Asencion Partridge Dohmeier, MD  sennosides-docusate sodium (SENOKOT-S) 8.6-50 MG tablet Take 2 tablets by mouth daily.   Yes Historical Provider, MD  metoprolol tartrate (LOPRESSOR) 25 MG tablet Take 1 tablet (25 mg total) by mouth 2 (two) times daily. Patient not taking: Reported on 02/18/2015 12/28/14   Jonetta Osgood, MD    Physical Exam: Filed Vitals:   02/18/15 2130 02/18/15 2215 02/18/15 2218 02/18/15 2230  BP: 143/92 124/77  130/79  Pulse: 79 76  82  Temp:   97.8 F (36.6 C)   TempSrc:   Rectal   Resp:      SpO2: 99% 100%  99%    General: Alert, Awake and Oriented to Time, Place and Person. Appear in mild distress Eyes: PERRL ENT: Oral Mucosa clear moist. Neck: no JVD Cardiovascular: S1 and S2 Present, aortic systolic Murmur, Peripheral Pulses Present Respiratory: Bilateral Air entry equal and Decreased,  Clear to Auscultation, no Crackles, no wheezes Abdomen: Bowel Sound present, Soft and non tender Skin: no Rash Extremities: no Pedal edema, no calf tenderness Neurologic: Motor strength equal bilaterally, decreased sensation to light touch on right side, difficulty with finger-nose-finger  Labs on Admission:  CBC:  Recent Labs Lab 02/18/15 2006  WBC 7.7  NEUTROABS 4.4  HGB 15.6*  HCT 48.7*  MCV 96.1  PLT 271    CMP     Component Value Date/Time   NA 140 02/18/2015 2006   NA 139 02/02/2015 1248   K 4.2 02/18/2015 2006   CL 102 02/18/2015 2006   CO2 30 02/18/2015 2006   GLUCOSE 95 02/18/2015 2006   BUN 23* 02/18/2015 2006   BUN 19 02/02/2015 1248   CREATININE 1.17* 02/18/2015 2006   CREATININE 1.0 02/02/2015 1248   CALCIUM 9.8 02/18/2015 2006   PROT 6.7 02/18/2015 2006   ALBUMIN 3.6 02/18/2015 2006   AST 31 02/18/2015 2006   ALT 31 02/18/2015 2006   ALKPHOS 109 02/18/2015 2006   BILITOT 0.5  02/18/2015 2006   GFRNONAA 42* 02/18/2015 2006   GFRAA 48* 02/18/2015 2006    No results for input(s): LIPASE, AMYLASE in the last 168 hours.  No results for input(s): CKTOTAL, CKMB, CKMBINDEX, TROPONINI in the last 168 hours. BNP (last 3 results) No results for input(s): BNP in the last 8760 hours.  ProBNP (last 3 results) No results for input(s): PROBNP in the last 8760 hours.   Radiological Exams on Admission: Ct Head Wo Contrast  02/18/2015   CLINICAL DATA:  Right hand numbness and tingling ; facial and bilateral lower extremity numbness  EXAM: CT HEAD WITHOUT CONTRAST  TECHNIQUE: Contiguous axial images were obtained from the base of the skull through the vertex without intravenous contrast.  COMPARISON:  December 25, 2014 head CT and brain MRI  FINDINGS: There is moderate diffuse atrophy. There is no intracranial mass, hemorrhage, extra-axial fluid collection, or midline shift. There is evidence of an acute infarct in the inferior, medial left occipital lobe with cytotoxic edema in this area. There is evidence of a prior infarct in the left superior temporal lobe extending to the left temporal -occipital junction. There is an infarct in the posterior right frontal lobe which appears slightly larger than on recent prior studies. There is patchy small vessel disease in the centra semiovale bilaterally.  Bony calvarium appears intact. There is patchy mastoid disease bilaterally.  IMPRESSION: Findings consistent with an acute infarct in the left inferior, medial occipital lobe. Prior left temporal and posterior right frontal lobe infarcts. The posterior right frontal infarct may have extended since recent studies and may have an acute component superimposed on chronic change. Atrophy with mild small vessel disease. No hemorrhage or mass effect. Patchy ethmoid sinus disease bilaterally.  These results were called by telephone at the time of interpretation on 02/18/2015 at 10:07 pm to Dr. Elnora Morrison  , who verbally acknowledged these results.   Electronically Signed   By: Lowella Grip III M.D.  On: 02/18/2015 22:08   Assessment/Plan Principal Problem:   CVA (cerebral infarction) Active Problems:   Essential hypertension   Hypothyroidism   Paroxysmal atrial fibrillation   Hemiparesis and speech and language deficit as late effects of stroke   1. CVA (cerebral infarction) The patient is presenting with complaints of right-sided numbness and weakness. CT of the head is concerning for left occipital territory stroke. Most likely etiology is unable to continue her anticoagulation. With this we'll obtain physical therapy and occupational therapy and speech therapy consult in the morning. Next and patient recently had other workup for CVA. Monitor on telemetry. Resume anticoagulation.  2. History of A. fib. Continuing anticoagulation.  3. History of essential hypertension. Permissive hypertension at present. Avoiding blood pressure medications.  4. Hypothyroidism. Continuing levothyroxine.   Advance goals of care discussion:  DNR/DNI as per my discussion with patient's son who is the power of attorney for the patient Proxy based on available documentation.  DVT Prophylaxis: chronic anticoagulation  Nutrition: Nothing by mouth pending stroke evaluation  Family Communication: family was present at bedside, opportunity was given to ask question and all questions were answered satisfactorily at the time of interview. Disposition: Admitted as inpatient, telemetry unit.  Author: Berle Mull, MD [facility] Pager: [phone] 02/18/2015  If 7PM-7AM, please contact night-coverage [email or web address] Password [email or web address]

## 2015-02-18 NOTE — ED Notes (Signed)
Patient transported to MRI 

## 2015-02-18 NOTE — ED Notes (Signed)
MD at bedside. 

## 2015-02-18 NOTE — ED Notes (Signed)
Pt.s family reported pt. Complained of right hand numbness and bilateral leg numbness at 6 pm this evening , equal grips with no arm drift with no facial asymmetry. History of stroke with speech deficits.

## 2015-02-18 NOTE — ED Notes (Addendum)
Dr. Reather Converse advised he did not want to call code stroke at this time due to patients presentation. Pts family reports she is currently still taking blood thinners that she was placed on for prior stroke.

## 2015-02-18 NOTE — ED Notes (Signed)
Pt ambulated to bathroom with standby assistance. Tolerated well.

## 2015-02-18 NOTE — ED Provider Notes (Signed)
CSN: [id number]     Arrival date & time 02/18/15  1935 History   First MD Initiated Contact with Patient 02/18/15 2058     Chief Complaint  Patient presents with  . Numbness     (Consider location/radiation/quality/duration/timing/severity/associated sxs/prior Treatment) HPI Comments: 81 year old female with history of restless legs, left cerebral artery stroke in April, high blood pressure, it or fibrillation, GI bleeding, malnutrition, aphasia, takes liquids presents with worsening right hand numbness and weakness since 5:30 this evening. Difficult for patient to express details due to stroke and aphasia history. Patient has been complaining about numbness sensation since then. Patient has been using her left arm for activities due to worsening right arm weakness since discharge from stroke. Aphasia baseline. No headache. No fevers chills or other symptoms.  The history is provided by the patient.    Past Medical History  Diagnosis Date  . History of cardiac catheterization 2002    Negative  . Vertebral fracture   . Restless legs syndrome with nocturnal myoclonus 05/13/2013     requip controlled   . Abnormal uterine bleeding [id number]  . Hypothyroidism   . Arthritis   . Osteoporosis   . History of stomach ulcers   . History of jaundice as a child   . Anemia   . Head pain, chronic     "I have pain in the back of my head for months"  . Dizziness     TAKES MECLIZINE TO TREAT  . Hemiparesis and speech and language deficit as late effects of stroke 02/09/2015   Past Surgical History  Procedure Laterality Date  . Back surgery      x 3  . Breast surgery Bilateral     1 lump each breast removed and benign  . Nasal sinus surgery    . Dilation and curettage of uterus  1970  . Catheterization  2003    cardiac cath  . Kyphoplasty  2010  . Cataract extraction Bilateral 2011  . Cholecystectomy  1995    Lap  . Cardiac catheterization  2002    "it was normal" per pt - no record found   . Robotic assisted salpingo oopherectomy Bilateral 11/02/2014    Procedure: ROBOTIC ASSISTED bilateral SALPINGO OOPHORECTOMY;  Surgeon: Everitt Amber, MD;  Location: [facility];  Service: Gynecology;  Laterality: Bilateral;  . Laparoscopy N/A 11/02/2014    Procedure: LAPAROSCOPY DIAGNOSTIC;  Surgeon: Everitt Amber, MD;  Location: [facility];  Service: Gynecology;  Laterality: N/A;   Family History  Problem Relation Age of Onset  . Asthma Brother   . Osteoarthritis Son   . Hypertension Father   . Stroke Father   . Cancer Brother     stomach   History  Substance Use Topics  . Smoking status: Never Smoker   . Smokeless tobacco: Never Used  . Alcohol Use: No   OB History    Gravida Para Term Preterm AB TAB SAB Ectopic Multiple Living   2 2       1 3      Review of Systems  Constitutional: Negative for fever and chills.  HENT: Negative for congestion.   Eyes: Negative for visual disturbance.  Respiratory: Negative for shortness of breath.   Cardiovascular: Negative for chest pain.  Gastrointestinal: Negative for vomiting and abdominal pain.  Genitourinary: Negative for dysuria and flank pain.  Musculoskeletal: Negative for back pain, neck pain and neck stiffness.  Skin: Negative for rash.  Neurological: Positive for weakness and numbness. Negative for light-headedness  and headaches.      Allergies  Ibuprofen; Penicillins; Demerol; Dilaudid; Fentanyl; Lidoderm; Lyrica; Morphine and related; Other; Percocet; Tramadol; and Vicodin  Home Medications   Prior to Admission medications   Medication Sig Start Date End Date Taking? Authorizing Provider  acetaminophen (TYLENOL) 500 MG tablet Take 1,000 mg by mouth every 6 (six) hours as needed. Usually only takes 2 x a  day   Yes Historical Provider, MD  apixaban (ELIQUIS) 5 MG TABS tablet Take 1 tablet (5 mg total) by mouth 2 (two) times daily. Please discontinue Aspirin once started on Eliquis! 02/15/15  Yes Kathrynn Ducking, MD  aspirin 325 MG  tablet Take 325 mg by mouth daily.   Yes Historical Provider, MD  calcium gluconate 500 MG tablet Take 500 mg by mouth 2 (two) times daily. With 1000 units D3   Yes Historical Provider, MD  cholecalciferol (VITAMIN D) 1000 UNITS tablet Take 1,000 Units by mouth 2 (two) times daily. Take with calium   Yes Historical Provider, MD  Cranberry 200 MG CAPS Take 1 capsule by mouth daily with lunch.    Yes Historical Provider, MD  denosumab (PROLIA) 60 MG/ML SOLN injection Inject 60 mg into the skin every 6 (six) months. Administer in upper arm, thigh, or abdomen   Yes Historical Provider, MD  diphenhydrAMINE (SOMINEX) 25 MG tablet Take 25 mg by mouth at bedtime as needed for sleep.   Yes Historical Provider, MD  Flaxseed, Linseed, (FLAX SEED OIL) 1000 MG CAPS Take 1 capsule by mouth 2 (two) times daily.    Yes Historical Provider, MD  hyoscyamine (ANASPAZ) 0.125 MG TBDP disintergrating tablet Place 0.125 mg under the tongue 2 (two) times daily as needed for bladder spasms.   Yes Historical Provider, MD  levothyroxine (SYNTHROID, LEVOTHROID) 50 MCG tablet Take 50 mcg by mouth every morning.    Yes Historical Provider, MD  meclizine (ANTIVERT) 25 MG tablet Take 25 mg by mouth daily as needed for dizziness.  05/07/13  Yes Historical Provider, MD  metoprolol succinate (TOPROL-XL) 25 MG 24 hr tablet Take 25 mg by mouth daily.   Yes Historical Provider, MD  Multiple Vitamins-Minerals (CENTRUM SILVER PO) Take 1 tablet by mouth daily with lunch.    Yes Historical Provider, MD  ondansetron (ZOFRAN) 4 MG tablet Take 1 tablet (4 mg total) by mouth every 8 (eight) hours as needed for nausea or vomiting. 11/02/14  Yes Lahoma Crocker, MD  pantoprazole sodium (PROTONIX) 40 mg/20 mL PACK Take 20 mLs (40 mg total) by mouth daily. 02/11/15  Yes Gildardo Cranker, DO  Propylene Glycol (SYSTANE BALANCE OP) Apply 1 drop to eye 2 (two) times daily.   Yes Historical Provider, MD  rOPINIRole (REQUIP) 0.25 MG tablet TAKE 1 TABLET BY  MOUTH 3 TIMES A DAY 09/10/14  Yes Asencion Partridge Dohmeier, MD  sennosides-docusate sodium (SENOKOT-S) 8.6-50 MG tablet Take 2 tablets by mouth daily.   Yes Historical Provider, MD  metoprolol tartrate (LOPRESSOR) 25 MG tablet Take 1 tablet (25 mg total) by mouth 2 (two) times daily. Patient not taking: Reported on 02/18/2015 12/28/14   Jonetta Osgood, MD   BP 130/79 mmHg  Pulse 82  Temp(Src) 97.8 F (36.6 C) (Rectal)  Resp 18  SpO2 99%  LMP 09/03/1996 (Approximate) Physical Exam  Constitutional: She is oriented to person, place, and time. She appears well-developed and well-nourished.  HENT:  Head: Normocephalic and atraumatic.  Eyes: Conjunctivae are normal. Right eye exhibits no discharge. Left eye exhibits no discharge.  Neck: Normal range of motion. Neck supple. No tracheal deviation present.  Cardiovascular: Normal rate and regular rhythm.   Pulmonary/Chest: Effort normal and breath sounds normal.  Abdominal: Soft. She exhibits no distension. There is no tenderness. There is no guarding.  Musculoskeletal: She exhibits no edema.  Neurological: She is alert and oriented to person, place, and time. A cranial nerve deficit is present.     Skin: Skin is warm. No rash noted.  Psychiatric:  Mild anmxious  Nursing note and vitals reviewed.   ED Course  Procedures (including critical care time) Labs Review Labs Reviewed  CBC WITH DIFFERENTIAL/PLATELET - Abnormal; Notable for the following:    Hemoglobin 15.6 (*)    HCT 48.7 (*)    All other components within normal limits  COMPREHENSIVE METABOLIC PANEL - Abnormal; Notable for the following:    BUN 23 (*)    Creatinine, Ser 1.17 (*)    GFR calc non Af Amer 42 (*)    GFR calc Af Amer 48 (*)    All other components within normal limits    Imaging Review Ct Head Wo Contrast  02/18/2015   CLINICAL DATA:  Right hand numbness and tingling ; facial and bilateral lower extremity numbness  EXAM: CT HEAD WITHOUT CONTRAST  TECHNIQUE:  Contiguous axial images were obtained from the base of the skull through the vertex without intravenous contrast.  COMPARISON:  December 25, 2014 head CT and brain MRI  FINDINGS: There is moderate diffuse atrophy. There is no intracranial mass, hemorrhage, extra-axial fluid collection, or midline shift. There is evidence of an acute infarct in the inferior, medial left occipital lobe with cytotoxic edema in this area. There is evidence of a prior infarct in the left superior temporal lobe extending to the left temporal -occipital junction. There is an infarct in the posterior right frontal lobe which appears slightly larger than on recent prior studies. There is patchy small vessel disease in the centra semiovale bilaterally.  Bony calvarium appears intact. There is patchy mastoid disease bilaterally.  IMPRESSION: Findings consistent with an acute infarct in the left inferior, medial occipital lobe. Prior left temporal and posterior right frontal lobe infarcts. The posterior right frontal infarct may have extended since recent studies and may have an acute component superimposed on chronic change. Atrophy with mild small vessel disease. No hemorrhage or mass effect. Patchy ethmoid sinus disease bilaterally.  These results were called by telephone at the time of interpretation on 02/18/2015 at 10:07 pm to Dr. Elnora Morrison , who verbally acknowledged these results.   Electronically Signed   By: Lowella Grip III M.D.   On: 02/18/2015 22:08     EKG Interpretation None      MDM   Final diagnoses:  Numbness and tingling in right hand  Right arm weakness  CVA (cerebral vascular accident)   Patient presents with concern for new stroke, recent stroke and chronic aphasia. Patient feels worsening symptoms in the right arm on exam patient is expressive aphasia. CT scan results reviewed with radiologist showing new stroke, no active bleeding. Discussed with neurology, discussed with hospitalist and plan for  observation. Patient on eliquis.  The patients results and plan were reviewed and discussed.   Any x-rays performed were personally reviewed by myself.   Differential diagnosis were considered with the presenting HPI.  Medications - No data to display  Filed Vitals:   02/18/15 2130 02/18/15 2215 02/18/15 2218 02/18/15 2230  BP: 143/92 124/77  130/79  Pulse: 79  76  82  Temp:   97.8 F (36.6 C)   TempSrc:   Rectal   Resp:      SpO2: 99% 100%  99%    Final diagnoses:  Numbness and tingling in right hand  Right arm weakness  CVA (cerebral vascular accident)    Admission/ observation were discussed with the admitting physician, patient and/or family and they are comfortable with the plan.      Elnora Morrison, MD 02/18/15 [phone]

## 2015-02-18 NOTE — ED Notes (Signed)
Pt here with numbness and tingling in bilateral legs and face today, per family. Pt has speech deficits from previous stroke.

## 2015-02-18 NOTE — Consult Note (Addendum)
Stroke Consult    Chief Complaint: increased RUE weakness  HPI: Linda [name] is an 81 y.o. female hx of recent stroke secondary to A fib with resultant right sided weakness and expressive aphasia. Presents today with increased RUE weakness and sensory deficits involving the RUE and bilateral LE. LE deficits resolved but RUE persists.   Her family reports she was prescribed Eliquis for her A fib after the most recent stroke but family reports she has not been taking it due to an insurance issue.   CT head imaging reviewed, shows acute infarct in the let inferior medial occipital lobe.   Date last known well: 02/18/2015 Time last known well: 1800 tPA Given: no, outside tPA window at time of referral and history of recent stroke  Past Medical History  Diagnosis Date  . History of cardiac catheterization 2002    Negative  . Vertebral fracture   . Restless legs syndrome with nocturnal myoclonus 05/13/2013     requip controlled   . Abnormal uterine bleeding 6269,4854  . Hypothyroidism   . Arthritis   . Osteoporosis   . History of stomach ulcers   . History of jaundice as a child   . Anemia   . Head pain, chronic     "I have pain in the back of my head for months"  . Dizziness     TAKES MECLIZINE TO TREAT  . Hemiparesis and speech and language deficit as late effects of stroke 02/09/2015    Past Surgical History  Procedure Laterality Date  . Back surgery      x 3  . Breast surgery Bilateral     1 lump each breast removed and benign  . Nasal sinus surgery    . Dilation and curettage of uterus  1970  . Catheterization  2003    cardiac cath  . Kyphoplasty  2010  . Cataract extraction Bilateral 2011  . Cholecystectomy  1995    Lap  . Cardiac catheterization  2002    "it was normal" per pt - no record found  . Robotic assisted salpingo oopherectomy Bilateral 11/02/2014    Procedure: ROBOTIC ASSISTED bilateral SALPINGO OOPHORECTOMY;  Surgeon: Everitt Amber, MD;  Location: [facility];   Service: Gynecology;  Laterality: Bilateral;  . Laparoscopy N/A 11/02/2014    Procedure: LAPAROSCOPY DIAGNOSTIC;  Surgeon: Everitt Amber, MD;  Location: [facility];  Service: Gynecology;  Laterality: N/A;    Family History  Problem Relation Age of Onset  . Asthma Brother   . Osteoarthritis Son   . Hypertension Father   . Stroke Father   . Cancer Brother     stomach   Social History:  reports that she has never smoked. She has never used smokeless tobacco. She reports that she does not drink alcohol or use illicit drugs.  Allergies:  Allergies  Allergen Reactions  . Ibuprofen Anaphylaxis    Passed out and had to be hospitalized  . Penicillins     Skin on head was crawling and very very sleepy  . Demerol [Meperidine Hcl] Nausea And Vomiting  . Dilaudid [Hydromorphone Hcl] Nausea And Vomiting  . Fentanyl   . Lidoderm [Lidocaine]   . Lyrica [Pregabalin]     "made me a zombie'  . Morphine And Related Nausea And Vomiting  . Other     hamburger  . Percocet [Oxycodone-Acetaminophen] Nausea And Vomiting  . Tramadol   . Vicodin [Hydrocodone-Acetaminophen] Nausea And Vomiting     (Not in a hospital admission)  ROS: Out of a complete 14 system review, the patient complains of only the following symptoms, and all other reviewed systems are negative. +weakness, numbness  Physical Examination: Filed Vitals:   02/18/15 2218  BP:   Pulse:   Temp: 97.8 F (36.6 C)  Resp:    Physical Exam  Constitutional: He appears well-developed and well-nourished.  Psych: Affect appropriate to situation Eyes: No scleral injection HENT: No OP obstrucion Head: Normocephalic.  Cardiovascular: Normal rate and regular rhythm.  Respiratory: Effort normal and breath sounds normal.  GI: Soft. Bowel sounds are normal. No distension. There is no tenderness.  Skin: WDI   Neurologic Examination: Neurologic Exam  Mental status: The patient is alert and cooperative, moderate receptive and expressive  aphasia, able to follow some verbal commands.   Cranial nerves: Facial symmetry is present. Speech is aphasic, the patient is able to repeat some simple phrases, she makes paraphasic errors. Extraocular movements are full. Visual fields are full.  Motor: RUE proximal 4+/5 otherwise intact and symmetric strength  Sensory examination: decreased LT on RUE and RLE  Coordination: The patient has good finger-nose-finger and heel-to-shin bilaterally.  Gait and station: deferred  Reflexes: Deep tendon reflexes are symmetric  Laboratory Studies:   Basic Metabolic Panel:  Recent Labs Lab 02/18/15 2006  NA 140  K 4.2  CL 102  CO2 30  GLUCOSE 95  BUN 23*  CREATININE 1.17*  CALCIUM 9.8    Liver Function Tests:  Recent Labs Lab 02/18/15 2006  AST 31  ALT 31  ALKPHOS 109  BILITOT 0.5  PROT 6.7  ALBUMIN 3.6   No results for input(s): LIPASE, AMYLASE in the last 168 hours. No results for input(s): AMMONIA in the last 168 hours.  CBC:  Recent Labs Lab 02/18/15 2006  WBC 7.7  NEUTROABS 4.4  HGB 15.6*  HCT 48.7*  MCV 96.1  PLT 271    Cardiac Enzymes: No results for input(s): CKTOTAL, CKMB, CKMBINDEX, TROPONINI in the last 168 hours.  BNP: Invalid input(s): POCBNP  CBG: No results for input(s): GLUCAP in the last 168 hours.  Microbiology: Results for orders placed or performed in visit on 10/29/14  Urine culture     Status: None   Collection Time: 10/29/14  2:35 PM  Result Value Ref Range Status   Urine Culture, Routine Culture, Urine  Final    Comment: Final - ===== COLONY COUNT: ===== NO GROWTH NO GROWTH     Coagulation Studies: No results for input(s): LABPROT, INR in the last 72 hours.  Urinalysis: No results for input(s): COLORURINE, LABSPEC, PHURINE, GLUCOSEU, HGBUR, BILIRUBINUR, KETONESUR, PROTEINUR, UROBILINOGEN, NITRITE, LEUKOCYTESUR in the last 168 hours.  Invalid input(s): APPERANCEUR  Lipid Panel:     Component Value Date/Time   CHOL 113  12/26/2014 0732   TRIG 58 12/26/2014 0732   HDL 49 12/26/2014 0732   CHOLHDL 2.3 12/26/2014 0732   VLDL 12 12/26/2014 0732   LDLCALC 52 12/26/2014 0732    HgbA1C:  Lab Results  Component Value Date   HGBA1C 5.9* 12/26/2014    Urine Drug Screen:     Component Value Date/Time   LABOPIA NONE DETECTED 12/25/2014 0915   COCAINSCRNUR NONE DETECTED 12/25/2014 0915   LABBENZ NONE DETECTED 12/25/2014 0915   AMPHETMU NONE DETECTED 12/25/2014 0915   THCU NONE DETECTED 12/25/2014 0915   LABBARB NONE DETECTED 12/25/2014 0915    Alcohol Level: No results for input(s): ETH in the last 168 hours.  Other results:  Imaging: Ct Head  Wo Contrast  02/18/2015   CLINICAL DATA:  Right hand numbness and tingling ; facial and bilateral lower extremity numbness  EXAM: CT HEAD WITHOUT CONTRAST  TECHNIQUE: Contiguous axial images were obtained from the base of the skull through the vertex without intravenous contrast.  COMPARISON:  December 25, 2014 head CT and brain MRI  FINDINGS: There is moderate diffuse atrophy. There is no intracranial mass, hemorrhage, extra-axial fluid collection, or midline shift. There is evidence of an acute infarct in the inferior, medial left occipital lobe with cytotoxic edema in this area. There is evidence of a prior infarct in the left superior temporal lobe extending to the left temporal -occipital junction. There is an infarct in the posterior right frontal lobe which appears slightly larger than on recent prior studies. There is patchy small vessel disease in the centra semiovale bilaterally.  Bony calvarium appears intact. There is patchy mastoid disease bilaterally.  IMPRESSION: Findings consistent with an acute infarct in the left inferior, medial occipital lobe. Prior left temporal and posterior right frontal lobe infarcts. The posterior right frontal infarct may have extended since recent studies and may have an acute component superimposed on chronic change. Atrophy with mild  small vessel disease. No hemorrhage or mass effect. Patchy ethmoid sinus disease bilaterally.  These results were called by telephone at the time of interpretation on 02/18/2015 at 10:07 pm to Dr. Elnora Morrison , who verbally acknowledged these results.   Electronically Signed   By: Lowella Grip III M.D.   On: 02/18/2015 22:08    Assessment: 81 y.o. female hx of recent CVA likely secondary to A fib presenting with new onset RUE sensory deficits. CT head imaging shows new left occipital infarct. Of note, she has not been taking her Eliquis. Suspect new infarct related to A fib.    Plan: 1. Do not need to repeat HbA1c or Lipid panel 2. MRI, MRA  of the brain without contrast 3. PT consult, OT consult, Speech consult 4. No need to repeat Echo or carotid doppler 5. Prophylactic therapy-start Eliquis 6. Risk factor modification 7. Telemetry monitoring 8. Frequent neuro checks 9. NPO until RN stroke swallow screen    Jim Like, DO [facility] [phone]  If 7pm- 7am, please page neurology on call as listed in [place]. 02/18/2015, 10:31 PM

## 2015-02-18 NOTE — ED Notes (Signed)
Patient ambulated to restroom with familie's assistance, tolerated well.

## 2015-02-19 ENCOUNTER — Encounter (HOSPITAL_COMMUNITY): Payer: Self-pay | Admitting: *Deleted

## 2015-02-19 ENCOUNTER — Inpatient Hospital Stay (HOSPITAL_COMMUNITY): Payer: Medicare Other

## 2015-02-19 DIAGNOSIS — I10 Essential (primary) hypertension: Secondary | ICD-10-CM

## 2015-02-19 DIAGNOSIS — I480 Paroxysmal atrial fibrillation: Secondary | ICD-10-CM

## 2015-02-19 LAB — COMPREHENSIVE METABOLIC PANEL
ALK PHOS: 88 U/L (ref 38–126)
ALT: 25 U/L (ref 14–54)
AST: 26 U/L (ref 15–41)
Albumin: 3.3 g/dL — ABNORMAL LOW (ref 3.5–5.0)
Anion gap: 7 (ref 5–15)
BILIRUBIN TOTAL: 0.7 mg/dL (ref 0.3–1.2)
BUN: 19 mg/dL (ref 6–20)
CHLORIDE: 102 mmol/L (ref 101–111)
CO2: 30 mmol/L (ref 22–32)
CREATININE: 1.01 mg/dL — AB (ref 0.44–1.00)
Calcium: 9.6 mg/dL (ref 8.9–10.3)
GFR calc Af Amer: 58 mL/min — ABNORMAL LOW (ref >60)
GFR calc non Af Amer: 50 mL/min — ABNORMAL LOW (ref >60)
Glucose, Bld: 100 mg/dL — ABNORMAL HIGH (ref 65–99)
POTASSIUM: 3.7 mmol/L (ref 3.5–5.1)
Sodium: 139 mmol/L (ref 135–145)
Total Protein: 6.2 g/dL — ABNORMAL LOW (ref 6.5–8.1)

## 2015-02-19 LAB — CBC WITH DIFFERENTIAL/PLATELET
Basophils Absolute: 0 10*3/uL (ref 0.0–0.1)
Basophils Relative: 1 % (ref 0–1)
EOS PCT: 4 % (ref 0–5)
Eosinophils Absolute: 0.3 10*3/uL (ref 0.0–0.7)
HCT: 43.1 % (ref 36.0–46.0)
Hemoglobin: 14 g/dL (ref 12.0–15.0)
LYMPHS PCT: 24 % (ref 12–46)
Lymphs Abs: 1.7 10*3/uL (ref 0.7–4.0)
MCH: 30.6 pg (ref 26.0–34.0)
MCHC: 32.5 g/dL (ref 30.0–36.0)
MCV: 94.1 fL (ref 78.0–100.0)
MONO ABS: 0.7 10*3/uL (ref 0.1–1.0)
Monocytes Relative: 10 % (ref 3–12)
NEUTROS ABS: 4.4 10*3/uL (ref 1.7–7.7)
NEUTROS PCT: 61 % (ref 43–77)
Platelets: 240 10*3/uL (ref 150–400)
RBC: 4.58 MIL/uL (ref 3.87–5.11)
RDW: 13.3 % (ref 11.5–15.5)
WBC: 7.2 10*3/uL (ref 4.0–10.5)

## 2015-02-19 LAB — CLOSTRIDIUM DIFFICILE BY PCR: CDIFFPCR: NEGATIVE

## 2015-02-19 MED ORDER — PNEUMOCOCCAL VAC POLYVALENT 25 MCG/0.5ML IJ INJ: 0.5000 mL | INJECTION | INTRAMUSCULAR | Status: DC

## 2015-02-19 MED ORDER — APIXABAN 2.5 MG PO TABS
2.5000 mg | ORAL_TABLET | Freq: Two times a day (BID) | ORAL | Status: DC
  Administered 2015-02-19 – 2015-02-20 (×3): 2.5 mg via ORAL
  Filled 2015-02-19 (×3): qty 1

## 2015-02-19 NOTE — Evaluation (Signed)
Physical Therapy Evaluation Patient Details Name: Linda [name] MRN: [id number] DOB: Jan 06, 1929 Today's Date: 02/19/2015   History of Present Illness  Patient is an 81 yo female admitted 02/18/15 with increasing numbness of RUE/RLE.  Patient with recent CVA in April 2016 with Rt-sided weakness and decreased speech.  Was d/c'ed to [facility] for continued therapy, and recently discharged back to home.  PMH:  CVA with Rt hemiparesis and decreased speech, Afib, back surgery, osteoporosis  Clinical Impression  Patient presents with problems listed below.  Will benefit from acute PT to maximize independence prior to discharge. Patient will need 24 hour assist at d/c.  Question if this is available as family works.  Recommend SNF for continued therapy at d/c (unless family can provide 24 hour assist).    Follow Up Recommendations SNF;Supervision/Assistance - 24 hour    Equipment Recommendations  None recommended by PT    Recommendations for Other Services       Precautions / Restrictions Precautions Precautions: Fall Restrictions Weight Bearing Restrictions: No      Mobility  Bed Mobility Overal bed mobility: Needs Assistance Bed Mobility: Supine to Sit;Sit to Supine     Supine to sit: Min guard Sit to supine: Min guard   General bed mobility comments: Assist for safety only.  Transfers Overall transfer level: Needs assistance Equipment used: 1 person hand held assist Transfers: Sit to/from Stand Sit to Stand: Mod assist;Min assist         General transfer comment: Initially required mod assist to rise to standing and for balance due to significant posterior lean.  Verbal and tactile cues to shift weight forward over feet.  Patient then needed only min assist for balance.  Ambulation/Gait Ambulation/Gait assistance: Min assist Ambulation Distance (Feet): 82 Feet Assistive device: 1 person hand held assist Gait Pattern/deviations: Step-through pattern;Decreased stride  length;Shuffle;Trunk flexed Gait velocity: Decreased Gait velocity interpretation: Below normal speed for age/gender General Gait Details: Verbal cues to look up during gait.  Patient with flexed posture, and short shuffling steps.  Stairs            Wheelchair Mobility    Modified Rankin (Stroke Patients Only) Modified Rankin (Stroke Patients Only) Pre-Morbid Rankin Score: Moderate disability Modified Rankin: Moderately severe disability     Balance Overall balance assessment: Needs assistance Sitting-balance support: No upper extremity supported;Feet supported Sitting balance-Leahy Scale: Good     Standing balance support: Single extremity supported Standing balance-Leahy Scale: Poor Standing balance comment: Posterior lean initially.                             Pertinent Vitals/Pain Pain Assessment: No/denies pain    Home Living Family/patient expects to be discharged to:: Private residence Living Arrangements: Children Available Help at Discharge: Family;Friend(s);Available PRN/intermittently (All 3 sons work per family.) Type of Home: House Home Access: Stairs to enter Entrance Stairs-Rails: Left Entrance Stairs-Number of Steps: 4 Home Layout: Two level;Laundry or work area in basement;Able to live on main level with bedroom/bathroom Home Equipment: [name] - single point;Walker - 2 wheels;Shower seat;Tub bench;Transport chair;Cane - quad      Prior Function Level of Independence: Needs assistance   Gait / Transfers Assistance Needed: Using cane/RW - assist for ambulation  ADL's / Homemaking Assistance Needed: Assist for ADL's         Hand Dominance   Dominant Hand: Right    Extremity/Trunk Assessment   Upper Extremity Assessment: Defer to OT evaluation  Lower Extremity Assessment: Generalized weakness RLE Deficits / Details: Only slight weakness compared to left.       Communication   Communication: Expressive  difficulties;Receptive difficulties  Cognition Arousal/Alertness: Awake/alert Behavior During Therapy: WFL for tasks assessed/performed;Anxious Overall Cognitive Status: History of cognitive impairments - at baseline                 General Comments: Difficult to assess with communication issues.  Difficulty following verbal commands (without visual input).    General Comments      Exercises        Assessment/Plan    PT Assessment Patient needs continued PT services  PT Diagnosis Difficulty walking;Abnormality of gait;Generalized weakness;Altered mental status   PT Problem List Decreased strength;Decreased activity tolerance;Decreased balance;Decreased mobility;Decreased cognition;Decreased knowledge of use of DME  PT Treatment Interventions DME instruction;Gait training;Functional mobility training;Therapeutic activities;Balance training;Cognitive remediation;Patient/family education   PT Goals (Current goals can be found in the Care Plan section) Acute Rehab PT Goals Patient Stated Goal: None stated PT Goal Formulation: With patient/family Time For Goal Achievement: 02/26/15 Potential to Achieve Goals: Good    Frequency Min 4X/week   Barriers to discharge Decreased caregiver support Family works.  Patient does not have 24 hour assist currently    Co-evaluation               End of Session Equipment Utilized During Treatment: Gait belt Activity Tolerance: Patient tolerated treatment well;Patient limited by fatigue Patient left: in bed;with call bell/phone within reach;with bed alarm set;with family/visitor present Nurse Communication: Mobility status         Time: 5883-2549 PT Time Calculation (min) (ACUTE ONLY): 26 min   Charges:   PT Evaluation $Initial PT Evaluation Tier I: 1 Procedure PT Treatments $Gait Training: 8-22 mins   PT G CodesDespina Pole March 01, 2015, 1:56 PM Carita Pian. Sanjuana Kava, [place] Pager  [phone]

## 2015-02-19 NOTE — Evaluation (Signed)
Speech Language Pathology Evaluation Patient Details Name: Linda [name] MRN: [id number] DOB: 01/27/1929 Today's Date: 02/19/2015 Time: 1005-1040 SLP Time Calculation (min) (ACUTE ONLY): 35 min  Problem List:  Patient Active Problem List   Diagnosis Date Noted  . CVA (cerebral infarction) 02/18/2015  . Hemiparesis and speech and language deficit as late effects of stroke 02/09/2015  . History of upper gastrointestinal bleeding   . Long-term (current) use of anticoagulants   . Protein-calorie malnutrition, severe 12/27/2014  . Paroxysmal atrial fibrillation   . Left middle cerebral artery stroke 12/25/2014  . Essential hypertension 12/25/2014  . Dysphagia, pharyngoesophageal phase 12/25/2014  . Hypothyroidism 12/25/2014  . Restless legs syndrome with nocturnal myoclonus 05/13/2013   Past Medical History:  Past Medical History  Diagnosis Date  . History of cardiac catheterization 2002    Negative  . Vertebral fracture   . Restless legs syndrome with nocturnal myoclonus 05/13/2013     requip controlled   . Abnormal uterine bleeding 2229,7989  . Hypothyroidism   . Arthritis   . Osteoporosis   . History of stomach ulcers   . History of jaundice as a child   . Anemia   . Head pain, chronic     "I have pain in the back of my head for months"  . Dizziness     TAKES MECLIZINE TO TREAT  . Hemiparesis and speech and language deficit as late effects of stroke 02/09/2015   Past Surgical History:  Past Surgical History  Procedure Laterality Date  . Back surgery      x 3  . Breast surgery Bilateral     1 lump each breast removed and benign  . Nasal sinus surgery    . Dilation and curettage of uterus  1970  . Catheterization  2003    cardiac cath  . Kyphoplasty  2010  . Cataract extraction Bilateral 2011  . Cholecystectomy  1995    Lap  . Cardiac catheterization  2002    "it was normal" per pt - no record found  . Robotic assisted salpingo oopherectomy Bilateral 11/02/2014    Procedure: ROBOTIC ASSISTED bilateral SALPINGO OOPHORECTOMY;  Surgeon: Everitt Amber, MD;  Location: [facility];  Service: Gynecology;  Laterality: Bilateral;  . Laparoscopy N/A 11/02/2014    Procedure: LAPAROSCOPY DIAGNOSTIC;  Surgeon: Everitt Amber, MD;  Location: [facility];  Service: Gynecology;  Laterality: N/A;   HPI:  81 y.o. female with Past medical history of recent CVA of the left MCA territory, A. fib, hypothyroidism.   Assessment / Plan / Recommendation Clinical Impression   Pt with recent CVA in April 2015 and discharged to SNF with severe expressive/receptive aphasia and probable apraxia with continued expressive/receptive aphasia present with 2-step directives (50% accuracy) and word-phrase production d/t perseverations, phonemic and semantic paraphasias being present; automatic responses (i.e: counting, days of week, familiar context) with 75% accuracy during functional tasks, but word-phrase production with convergent/divergent tasks and confrontational naming tasks 0-24% accurate during SLE; brother stated she has improved overall with being able to "get her words out" than with previous CVA; MRI head on 02/19/15 indicated subacute ischemic left PCA territory infarct with left occipital lobe and interval evolution of late subacute to chronic bilateral MCA territory infarcts involving both left and right frontal lobes    SLP Assessment  Patient needs continued Speech Language Pathology Services    Follow Up Recommendations  Home health SLP;Outpatient SLP    Frequency and Duration min 2x/week  1 week   Pertinent  Vitals/Pain Pain Assessment: No/denies pain   SLP Goals  Potential to Achieve Goals (ACUTE ONLY): Fair Potential Considerations (ACUTE ONLY): Co-morbidities;Previous level of function  SLP Evaluation Prior Functioning  Cognitive/Linguistic Baseline: Baseline deficits Type of Home: House Available Help at Discharge: Family;Friend(s) 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  Overall Cognitive Status: History of cognitive impairments - at baseline Arousal/Alertness: Awake/alert Orientation Level: Oriented to person;Oriented to place;Oriented to situation;Other (comment) Attention: Focused Focused Attention: Appears intact Memory: Impaired Memory Impairment: Retrieval deficit Awareness: Appears intact Problem Solving: Impaired Problem Solving Impairment: Verbal basic;Functional basic Behaviors: Perseveration Safety/Judgment: Other (comment) Comments: difficult to assess    Comprehension  Auditory Comprehension Overall Auditory Comprehension: Impaired at baseline Yes/No Questions: Within Functional Limits Basic Biographical Questions: Other (comment) Commands: Impaired One Step Basic Commands: 50-74% accurate Two Step Basic Commands: 0-24% accurate Conversation: Simple Other Conversation Comments: aphasia affects overall Interfering Components: Motor planning;Processing speed EffectiveTechniques: Extra processing time;Visual/Gestural cues Visual Recognition/Discrimination Discrimination: Not tested Reading Comprehension Reading Status: Not tested    Expression Expression Primary Mode of Expression: Other (comment) Verbal Expression Overall Verbal Expression: Impaired at baseline Initiation: Impaired Automatic Speech: Social Response;Counting;Day of week Level of Generative/Spontaneous Verbalization: Word;Phrase Repetition: Impaired Level of Impairment: Word level Naming: Impairment Responsive: 26-50% accurate Confrontation: Impaired Convergent: 0-24% accurate Divergent: 0-24% accurate Verbal Errors: Semantic paraphasias;Phonemic paraphasias;Perseveration Pragmatics: Unable to assess Interfering Components: Premorbid deficit Effective Techniques: Semantic cues;Sentence completion;Phonemic cues Non-Verbal Means of Communication: Gestures Written Expression Written Expression: Not tested   Oral / Motor Oral  Motor/Sensory Function Overall Oral Motor/Sensory Function: Impaired Labial ROM: Reduced right Labial Symmetry: Abnormal symmetry right Labial Strength: Reduced Labial Sensation: Reduced Lingual ROM: Reduced right Lingual Symmetry: Abnormal symmetry right Lingual Strength: Reduced Lingual Sensation: Within Functional Limits Facial ROM: Reduced right Facial Symmetry: Right droop Facial Strength: Reduced Facial Sensation: Within Functional Limits Velum: Within Functional Limits Mandible: Impaired Motor Speech Overall Motor Speech: Impaired at baseline Respiration: Within functional limits Phonation: Normal Resonance: Within functional limits Articulation: Impaired Level of Impairment: Phrase Intelligibility: Intelligibility reduced Word: 25-49% accurate Phrase: 0-24% accurate Sentence: 0-24% accurate Conversation: 0-24% accurate Motor Planning: Impaired Level of Impairment: Word Motor Speech Errors: Aware;Groping for words;Inconsistent Interfering Components: Premorbid status Effective Techniques: Slow rate        Mary-Ann Pennella,PAT, M.S., CCC-SLP 02/19/2015, 12:01 PM

## 2015-02-19 NOTE — Progress Notes (Signed)
PHARMACIST - PHYSICIAN COMMUNICATION   DESCRIPTION: 81yo female on Eliquis 5mg  BID since 12/31/14 after CVA 2/2 Afib.  Pt's wt of 41kg PLUS age of 17 indicate lower dosing of 2.5mg  BID (pts require two of three dose adjustment categories among age, wt, and SCr).  Consider changing to Eliquis 2.5mg  BID.   Wynona Neat, PharmD, BCPS 02/19/2015 1:58 AM

## 2015-02-19 NOTE — Progress Notes (Signed)
Pt. Arrived from ED with tech and 3 sons. Reporting no pian and VSS stable. Will continue to monitor. Joaquin Bend E, RN 02/19/2015 0200

## 2015-02-19 NOTE — Progress Notes (Signed)
ANTICOAGULATION CONSULT NOTE - Initial Consult  Pharmacy Consult for Apixaban  Indication: atrial fibrillation  Allergies  Allergen Reactions  . Ibuprofen Anaphylaxis    Passed out and had to be hospitalized  . Penicillins     Skin on head was crawling and very very sleepy  . Demerol [Meperidine Hcl] Nausea And Vomiting  . Dilaudid [Hydromorphone Hcl] Nausea And Vomiting  . Fentanyl   . Lidoderm [Lidocaine]   . Lyrica [Pregabalin]     "made me a zombie'  . Morphine And Related Nausea And Vomiting  . Other     hamburger  . Percocet [Oxycodone-Acetaminophen] Nausea And Vomiting  . Tramadol   . Vicodin [Hydrocodone-Acetaminophen] Nausea And Vomiting    Patient Measurements: Height: 5\' 7"  (170.2 cm) Weight: 91 lb 0.8 oz (41.3 kg) IBW/kg (Calculated) : 61.6  Vital Signs: Temp: 97.9 F (36.6 C) (06/18 0145) Temp Source: Oral (06/18 0145) BP: 157/90 mmHg (06/18 0145) Pulse Rate: 80 (06/18 0145)  Labs:  Recent Labs  02/18/15 2006  HGB 15.6*  HCT 48.7*  PLT 271  CREATININE 1.17*    Estimated Creatinine Clearance: 23.3 mL/min (by C-G formula based on Cr of 1.17).   Medical History: Past Medical History  Diagnosis Date  . History of cardiac catheterization 2002    Negative  . Vertebral fracture   . Restless legs syndrome with nocturnal myoclonus 05/13/2013     [place] controlled   . Abnormal uterine bleeding 9562,1308  . Hypothyroidism   . Arthritis   . Osteoporosis   . History of stomach ulcers   . History of jaundice as a child   . Anemia   . Head pain, chronic     "I have pain in the back of my head for months"  . Dizziness     TAKES MECLIZINE TO TREAT  . Hemiparesis and speech and language deficit as late effects of stroke 02/09/2015    Medications:  Prescriptions prior to admission  Medication Sig Dispense Refill Last Dose  . acetaminophen (TYLENOL) 500 MG tablet Take 1,000 mg by mouth every 6 (six) hours as needed. Usually only takes 2 x a  day    Past Week at Unknown time  . apixaban (ELIQUIS) 5 MG TABS tablet Take 1 tablet (5 mg total) by mouth 2 (two) times daily. Please discontinue Aspirin once started on Eliquis! 60 tablet 3 02/09/2015 at Unknown time  . calcium gluconate 500 MG tablet Take 500 mg by mouth 2 (two) times daily. With 1000 units D3   02/18/2015 at Unknown time  . cholecalciferol (VITAMIN D) 1000 UNITS tablet Take 1,000 Units by mouth 2 (two) times daily. Take with calium   02/18/2015 at Unknown time  . Cranberry 200 MG CAPS Take 1 capsule by mouth daily with lunch.    02/18/2015 at Unknown time  . denosumab (PROLIA) 60 MG/ML SOLN injection Inject 60 mg into the skin every 6 (six) months. Administer in upper arm, thigh, or abdomen   Past Month at Unknown time  . diphenhydrAMINE (SOMINEX) 25 MG tablet Take 25 mg by mouth at bedtime as needed for sleep.   Past Week at Unknown time  . Flaxseed, Linseed, (FLAX SEED OIL) 1000 MG CAPS Take 1 capsule by mouth 2 (two) times daily.    02/18/2015 at Unknown time  . hyoscyamine (ANASPAZ) 0.125 MG TBDP disintergrating tablet Place 0.125 mg under the tongue 2 (two) times daily as needed for bladder spasms.   Past Week at Unknown time  .  levothyroxine (SYNTHROID, LEVOTHROID) 50 MCG tablet Take 50 mcg by mouth every morning.    02/18/2015 at Unknown time  . meclizine (ANTIVERT) 25 MG tablet Take 25 mg by mouth daily as needed for dizziness.    Past Week at Unknown time  . metoprolol succinate (TOPROL-XL) 25 MG 24 hr tablet Take 25 mg by mouth daily.   02/18/2015 at 0800  . Multiple Vitamins-Minerals (CENTRUM SILVER PO) Take 1 tablet by mouth daily with lunch.    02/18/2015 at Unknown time  . ondansetron (ZOFRAN) 4 MG tablet Take 1 tablet (4 mg total) by mouth every 8 (eight) hours as needed for nausea or vomiting. 20 tablet 0 Past Week at Unknown time  . pantoprazole sodium (PROTONIX) 40 mg/20 mL PACK Take 20 mLs (40 mg total) by mouth daily. 30 each 4 02/18/2015 at Unknown time  . Propylene Glycol  (SYSTANE BALANCE OP) Apply 1 drop to eye 2 (two) times daily.   02/18/2015 at Unknown time  . rOPINIRole (REQUIP) 0.25 MG tablet TAKE 1 TABLET BY MOUTH 3 TIMES A DAY 270 tablet 2 02/18/2015 at Unknown time  . sennosides-docusate sodium (SENOKOT-S) 8.6-50 MG tablet Take 2 tablets by mouth daily.   02/18/2015 at Unknown time  . metoprolol tartrate (LOPRESSOR) 25 MG tablet Take 1 tablet (25 mg total) by mouth 2 (two) times daily. (Patient not taking: Reported on 02/18/2015)   Not Taking at Unknown time    Assessment: 81 y.o. female presents with R-sided weakness and numbness. Noted recent CVA with afib. Pt on Eliquis PTA 5mg  po BID. Pt's wt of 41kg PLUS age of 83 indicate lower dosing of 2.5mg  BID (pts require two of three dose adjustment categories among age, wt, and SCr). CBC ok at baseline.  Goal of Therapy:  Prevention of CVA Monitor platelets by anticoagulation protocol: Yes   Plan:  Apixaban 2.5mg  po BID Will f/u s/s bleeding Pharmacy will sign off - please reconsult if needed  Sherlon Handing, PharmD, BCPS Clinical pharmacist, pager [phone] 02/19/2015,3:53 AM

## 2015-02-19 NOTE — Progress Notes (Signed)
STROKE TEAM PROGRESS NOTE   HISTORY Linda [name] is an 81 y.o. female hx of recent stroke secondary to A fib with resultant right sided weakness and expressive aphasia. Presents today with increased RUE weakness and sensory deficits involving the RUE and bilateral LE. LE deficits resolved but RUE persists.   Her family reports she was prescribed Eliquis for her A fib after the most recent stroke but family reports she has not been taking it due to an insurance issue.   CT head imaging reviewed, shows acute infarct in the let inferior medial occipital lobe.   Date last known well: 02/18/2015 Time last known well: 1800 tPA Given: no, outside tPA window at time of referral and history of recent stroke    [[[[[[[[[[[[Dr Willis office note 02-09-15 Linda [name] is a 81 year old right-handed white female with a history of a stroke event that occurred on 12/25/2014. 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 The patient has had a resultant mild right hemiparesis, but her main deficit is an aphasia syndrome that affects the expressive component more than the afferent component of language. The patient is unable to read or write. She has been in an extended care facility, she is going home today. She is accompanied by her son. He indicates that the patient is no longer able to manage her financial, legal, and medical affairs secondary to the language deficit. They require a letter indicating this that may allow him to access funds from a trust to help her get the therapy that she needs following the hospitalization. The patient was operating a motor vehicle prior to the stroke, she was using a cane for ambulation before. Now, she is unable to drive, and she uses a walker for mobility. She is followed by Dr. Wynonia Lawman from cardiology. The 2-D echocardiogram in the hospital was relatively unremarkable, as was a carotid  Doppler study. MRI confirmed the left middle cerebral artery distribution stroke.]]]]]]]]   SUBJECTIVE (INTERVAL HISTO Multiple family is at the bedside.  Dr Merlene Laughter discussd case w them. They report that paradoxically spech fluency is better even over last stroke 2 months ago.   OBJECTIVE Temp:  [97.7 F (36.5 C)-98.4 F (36.9 C)] 97.7 F (36.5 C) (06/18 1000) Pulse Rate:  [73-84] 84 (06/18 1000) Cardiac Rhythm:  [-] Atrial fibrillation (06/18 0200) Resp:  [12-18] 14 (06/18 1000) BP: (124-157)/(73-92) 146/91 mmHg (06/18 1000) SpO2:  [94 %-100 %] 100 % (06/18 1000) Weight:  [41.3 kg (91 lb 0.8 oz)] 41.3 kg (91 lb 0.8 oz) (06/18 0145)  No results for input(s): GLUCAP in the last 168 hours.  Recent Labs Lab 02/18/15 2006 02/19/15 0535  NA 140 139  K 4.2 3.7  CL 102 102  CO2 30 30  GLUCOSE 95 100*  BUN 23* 19  CREATININE 1.17* 1.01*  CALCIUM 9.8 9.6    Recent Labs Lab 02/18/15 2006 02/19/15 0535  AST 31 26  ALT 31 25  ALKPHOS 109 88  BILITOT 0.5 0.7  PROT 6.7 6.2*  ALBUMIN 3.6 3.3*    Recent Labs Lab 02/18/15 2006 02/19/15 0535  WBC 7.7 7.2  NEUTROABS 4.4 4.4  HGB 15.6* 14.0  HCT 48.7* 43.1  MCV 96.1 94.1  PLT 271 240   No results for input(s): CKTOTAL, CKMB, CKMBINDEX, TROPONINI in the last 168 hours. No results for input(s): LABPROT, INR in the last 72 hours. No results for input(s): COLORURINE,  LABSPEC, PHURINE, GLUCOSEU, HGBUR, BILIRUBINUR, KETONESUR, PROTEINUR, UROBILINOGEN, NITRITE, LEUKOCYTESUR in the last 72 hours.  Invalid input(s): APPERANCEUR     Component Value Date/Time   CHOL 113 12/26/2014 0732   TRIG 58 12/26/2014 0732   HDL 49 12/26/2014 0732   CHOLHDL 2.3 12/26/2014 0732   VLDL 12 12/26/2014 0732   LDLCALC 52 12/26/2014 0732   Lab Results  Component Value Date   HGBA1C 5.9* 12/26/2014      Component Value Date/Time   LABOPIA NONE DETECTED 12/25/2014 0915   COCAINSCRNUR NONE DETECTED 12/25/2014 0915   LABBENZ NONE DETECTED  12/25/2014 0915   AMPHETMU NONE DETECTED 12/25/2014 0915   THCU NONE DETECTED 12/25/2014 0915   LABBARB NONE DETECTED 12/25/2014 0915    No results for input(s): ETH in the last 168 hours.  Ct Head Wo Contrast 02/18/2015    Findings consistent with an acute infarct in the left inferior, medial occipital lobe.  Prior left temporal and posterior right frontal lobe infarcts.  The posterior right frontal infarct may have extended since recent studies and may have an acute component superimposed on chronic change.  Atrophy with mild small vessel disease. No hemorrhage or mass effect. Patchy ethmoid sinus disease bilaterally.      Mr Brain Wo Contrast 02/19/2015    1. Findings consistent with subacute ischemic left PCA territory infarct involving the left occipital lobe.  2. Interval evolution of late subacute to chronic bilateral MCA territory infarcts involving both the left and right frontal lobes as above. No acute component identified about these infarcts.  3. Moderate generalized cerebral atrophy.     Above imaging reviewed and also shows R temp encephalomalacia.    PHYSICAL EXAM  GENERAL: No distress  HEENT: normal  ABDOMEN: soft  EXTREMITIES: No edema   BACK: normal  SKIN: Normal by inspection.    MENTAL STATUS: Alert. Severe global aphasia. 1 or 2 words sometimes but mostly mute.   CRANIAL NERVES: Pupils are equal, round and reactive to light and accomodation; extra ocular movements are full, there is no significant nystagmus; visual fields are full; upper facial muscles are normal in strength and symmetric- R lower facial weakness, there is no flattening of the nasolabial folds; tongue is midline;   MOTOR: Normal tone, bulk and strength arms and legs; no pronator drift.  COORDINATION: No rest tremor; no intention tremor; no postural tremor; no bradykinesia.  SENSATION: unreliable      ASSESSMENT/PLAN Linda [name] is a 81 y.o. female with history of  previous strokes, atrial fibrillation treated with Eliquis for poor compliance secondary to cost presenting with increased right upper extremity weakness.  She did not receive IV t-PA due to late presentation.   Strokes: Bilateral infarcts felt to be embolic secondary to atrial fibrillation.  Resultant  aphasia  MRI  as above  MRA  not performed  Carotid Doppler (12/27/2014) - The vertebral arteries appear patent with antegrade flow. Findings consistent with 1-39 percent stenosis involving the right internal carotid artery and the left internal carotid artery.  2D Echo - (12/27/2014) - EF 55-60%. No cardiac source of emboli identified.  LDL (12/26/2014) - 52  HgbA1c (12/26/2014) 5.9  Eliquis for VTE prophylaxis DIET DYS 3 Room service appropriate?: Yes; Fluid consistency:: Thin  eliquis (apixaban) prior to admission, now on eliquis (apixaban)  Patient counseled to be compliant with her antithrombotic medications  Ongoing aggressive stroke risk factor management  Therapy recommendations: Pending  Disposition: Pending  Hypertension  Home meds: Metoprolol  Stable  Patient counseled to be compliant with her blood pressure medications     Other Active Problems    Other Pertinent History    Hospital day # 1  The patient was seen and examined by me; notes, chart and tests reviewed and discussed with midlevel provider, other providers, patient, and family.       To contact Stroke Continuity provider, please refer to [email or web address] After hours, contact General Neurology

## 2015-02-19 NOTE — Progress Notes (Addendum)
PROGRESS NOTE  LEVA BAINE LOV:[id number] DOB: 1928/10/28 DOA: 02/18/2015 PCP: [name], MD  Assessment/Plan: CVA (cerebral infarction) - right-sided numbness and weakness. OAC:[id number] ischemic left PCA territory infarct involving the left occipital lobe.  -PT/OT/SLP -recent echo, carotid, FLP and HgbA1C - telemetry. Resume anticoagulation- was having issues with insurance at home  History of A. fib. Continuing anticoagulation.  History of essential hypertension. Permissive hypertension at present. Avoiding blood pressure medications.  Hypothyroidism. Continuing levothyroxine.   Code Status: DNR Family Communication: patient/son at bedside Disposition Plan:    Consultants:  neuro  Procedures:      HPI/Subjective: Tired this AM  Objective: Filed Vitals:   02/19/15 0600  BP: 141/80  Pulse: 73  Temp: 97.9 F (36.6 C)  Resp: 18   No intake or output data in the 24 hours ending 02/19/15 0748 Filed Weights   02/19/15 0145  Weight: 41.3 kg (91 lb 0.8 oz)    Exam:   General:  A+Ox3, NAD  Cardiovascular: rrr  Respiratory: clear  Abdomen: +Bs, soft  Musculoskeletal: weakness R> L   Data Reviewed: Basic Metabolic Panel:  Recent Labs Lab 02/18/15 2006 02/19/15 0535  NA 140 139  K 4.2 3.7  CL 102 102  CO2 30 30  GLUCOSE 95 100*  BUN 23* 19  CREATININE 1.17* 1.01*  CALCIUM 9.8 9.6   Liver Function Tests:  Recent Labs Lab 02/18/15 2006 02/19/15 0535  AST 31 26  ALT 31 25  ALKPHOS 109 88  BILITOT 0.5 0.7  PROT 6.7 6.2*  ALBUMIN 3.6 3.3*   No results for input(s): LIPASE, AMYLASE in the last 168 hours. No results for input(s): AMMONIA in the last 168 hours. CBC:  Recent Labs Lab 02/18/15 2006 02/19/15 0535  WBC 7.7 7.2  NEUTROABS 4.4 4.4  HGB 15.6* 14.0  HCT 48.7* 43.1  MCV 96.1 94.1  PLT 271 240   Cardiac Enzymes: No results for input(s): CKTOTAL, CKMB, CKMBINDEX, TROPONINI in the last 168  hours. BNP (last 3 results) No results for input(s): BNP in the last 8760 hours.  ProBNP (last 3 results) No results for input(s): PROBNP in the last 8760 hours.  CBG: No results for input(s): GLUCAP in the last 168 hours.  No results found for this or any previous visit (from the past 240 hour(s)).   Studies: Ct Head Wo Contrast  02/18/2015   CLINICAL DATA:  Right hand numbness and tingling ; facial and bilateral lower extremity numbness  EXAM: CT HEAD WITHOUT CONTRAST  TECHNIQUE: Contiguous axial images were obtained from the base of the skull through the vertex without intravenous contrast.  COMPARISON:  December 25, 2014 head CT and brain MRI  FINDINGS: There is moderate diffuse atrophy. There is no intracranial mass, hemorrhage, extra-axial fluid collection, or midline shift. There is evidence of an acute infarct in the inferior, medial left occipital lobe with cytotoxic edema in this area. There is evidence of a prior infarct in the left superior temporal lobe extending to the left temporal -occipital junction. There is an infarct in the posterior right frontal lobe which appears slightly larger than on recent prior studies. There is patchy small vessel disease in the centra semiovale bilaterally.  Bony calvarium appears intact. There is patchy mastoid disease bilaterally.  IMPRESSION: Findings consistent with an acute infarct in the left inferior, medial occipital lobe. Prior left temporal and posterior right frontal lobe infarcts. The posterior right frontal infarct may have extended since recent studies and may have an acute  component superimposed on chronic change. Atrophy with mild small vessel disease. No hemorrhage or mass effect. Patchy ethmoid sinus disease bilaterally.  These results were called by telephone at the time of interpretation on 02/18/2015 at 10:07 pm to Dr. Elnora Morrison , who verbally acknowledged these results.   Electronically Signed   By: Lowella Grip III M.D.   On:  02/18/2015 22:08   Mr Brain Wo Contrast  02/19/2015   CLINICAL DATA:  81 year old female with history of recent stroke secondary to AFib with resultant right-sided weakness and expressive aphasia, now presenting with increased right upper extremity weakness and sensory deficits.  EXAM: MRI HEAD WITHOUT CONTRAST  TECHNIQUE: Multiplanar, multiecho pulse sequences of the brain and surrounding structures were obtained without intravenous contrast.  COMPARISON:  Prior CT from earlier the same day as well as previous MRI from 12/25/2014  FINDINGS: There has been interval evolution of recently identified left MCA territory infarct involving the left insular cortex and operculum. This area of infarction now demonstrates encephalomalacia with associated laminar necrosis. There is persistent DWI signal abnormality about the margins of this infarction. In addition, there has been interval evolution of a right MCA territory infarct involving the anterior right frontal lobe which now demonstrates increased encephalomalacia as compared to the previous study. There is persistent DWI signal abnormality around this area of infarction is well. Both of these infarcts were present on the previous MRI from 12/25/2014.  There is a new ischemic infarct involving the left occipital lobe and the left PCA territory (series 4, image 20). This appears to be subacute in nature with fairly heterogeneous DWI signal intensity and normalized ADC signal, with associated early laminar necrosis and encephalomalacia.  No other infarct identified. No acute or chronic intracranial hemorrhage. Normal intravascular flow voids are preserved.  No mass lesion, mass effect, or midline shift.  Generalized cerebral atrophy with mild chronic microvascular ischemic disease noted. Ventricular prominence is stable from previous study, likely related to global parenchymal volume loss. No hydrocephalus. No extra-axial fluid collection.  Craniocervical junction  within normal limits. Visualized upper cervical spine demonstrates no acute abnormality. Pituitary gland normal. No acute abnormality about the orbits.  Mild mucosal thickening present within the ethmoidal air cells. Minimal fluid opacity present within the left mastoid air cells posteriorly. Inner ear structures normal.  Bone marrow signal intensity within normal limits. Scalp soft tissues within normal limits.  IMPRESSION: 1. Findings consistent with subacute ischemic left PCA territory infarct involving the left occipital lobe. 2. Interval evolution of late subacute to chronic bilateral MCA territory infarcts involving both the left and right frontal lobes as above. No acute component identified about these infarcts. 3. Moderate generalized cerebral atrophy.   Electronically Signed   By: Jeannine Boga M.D.   On: 02/19/2015 01:21    Scheduled Meds: . apixaban  2.5 mg Oral BID  . levothyroxine  50 mcg Oral QAC breakfast  . [START ON 02/20/2015] pneumococcal 23 valent vaccine  0.5 mL Intramuscular Tomorrow-1000  . rOPINIRole  0.25 mg Oral TID   Continuous Infusions:  Antibiotics Given (last 72 hours)    None      Principal Problem:   CVA (cerebral infarction) Active Problems:   Essential hypertension   Hypothyroidism   Paroxysmal atrial fibrillation   Hemiparesis and speech and language deficit as late effects of stroke    Time spent: 25 min    VANN, JESSICA  [facility] Pager [phone]. If 7PM-7AM, please contact night-coverage at [email or web address], password [facility]  02/19/2015, 7:48 AM  LOS: 1 day

## 2015-02-19 NOTE — Evaluation (Signed)
Clinical/Bedside Swallow Evaluation Patient Details  Name: Linda [name] MRN: [id number] Date of Birth: [birth year]  Today's Date: 02/19/2015 Time: SLP Start Time (ACUTE ONLY): 22 SLP Stop Time (ACUTE ONLY): 1040 SLP Time Calculation (min) (ACUTE ONLY): 35 min  Past Medical History:  Past Medical History  Diagnosis Date  . History of cardiac catheterization 2002    Negative  . Vertebral fracture   . Restless legs syndrome with nocturnal myoclonus 05/13/2013     [place] controlled   . Abnormal uterine bleeding [id number]  . Hypothyroidism   . Arthritis   . Osteoporosis   . History of stomach ulcers   . History of jaundice as a child   . Anemia   . Head pain, chronic     "I have pain in the back of my head for months"  . Dizziness     TAKES MECLIZINE TO TREAT  . Hemiparesis and speech and language deficit as late effects of stroke 02/09/2015   Past Surgical History:  Past Surgical History  Procedure Laterality Date  . Back surgery      x 3  . Breast surgery Bilateral     1 lump each breast removed and benign  . Nasal sinus surgery    . Dilation and curettage of uterus  1970  . Catheterization  2003    cardiac cath  . Kyphoplasty  2010  . Cataract extraction Bilateral 2011  . Cholecystectomy  1995    Lap  . Cardiac catheterization  2002    "it was normal" per pt - no record found  . Robotic assisted salpingo oopherectomy Bilateral 11/02/2014    Procedure: ROBOTIC ASSISTED bilateral SALPINGO OOPHORECTOMY;  Surgeon: Everitt Amber, MD;  Location: [facility];  Service: Gynecology;  Laterality: Bilateral;  . Laparoscopy N/A 11/02/2014    Procedure: LAPAROSCOPY DIAGNOSTIC;  Surgeon: Everitt Amber, MD;  Location: [facility];  Service: Gynecology;  Laterality: N/A;   HPI:  81 y.o. female with Past medical history of recent CVA of the left MCA territory, A. fib, hypothyroidism.   Assessment / Plan / Recommendation Clinical Impression   Pt exhibited no s/s of overt aspiration with thin via  cup/straw, puree or solid consistency; brothers stated she had been upgraded to a regular diet with thin liquids recently; pt had minimal oral residue and apraxia/aphasia present throughout BSE; SLE completed as well with global aphasia vs expressive aphasia/severe apraxia questionable d/t responses with verbal cues (i.e.: carrier phrases, phonemic cues); recommend Dysphagia 3/thin and ST f/u 2x/wk for diet tolerance; meds as tolerated    Aspiration Risk  Mild    Diet Recommendation Dysphagia 3 (Mech soft);Thin   Medication Administration: Other (Comment) Compensations: Slow rate;Small sips/bites    Other  Recommendations Oral Care Recommendations: Oral care BID   Follow Up Recommendations    OP or HH ST   Frequency and Duration min 2x/week  1 week   Pertinent Vitals/Pain WDL    SLP Swallow Goals  See POC   Swallow Study Prior Functional Status   SNF; previous CVA    General Date of Onset: 02/18/15 Other Pertinent Information: 81 y.o. female with Past medical history of recent CVA of the left MCA territory, A. fib, hypothyroidism; presents to ED on 02/18/15 with numbness on right side; difficulty with speech Type of Study: Bedside swallow evaluation Previous Swallow Assessment:  (12/26/14; swallow tx puree/NTL) Diet Prior to this Study: NPO Temperature Spikes Noted: No Respiratory Status: Room air Behavior/Cognition: Alert;Cooperative;Requires cueing Oral Cavity - Dentition:  Adequate natural dentition/normal for age Self-Feeding Abilities: Able to feed self;Needs assist Patient Positioning: Upright in bed Baseline Vocal Quality: Normal Volitional Cough: Strong;Cognitively unable to elicit Volitional Swallow: Unable to elicit    Oral/Motor/Sensory Function Overall Oral Motor/Sensory Function: Impaired at baseline Labial ROM: Reduced right Labial Symmetry: Abnormal symmetry right Labial Strength: Reduced Labial Sensation: Reduced Lingual ROM: Reduced right Lingual  Symmetry: Abnormal symmetry right Lingual Strength: Reduced Lingual Sensation: Within Functional Limits Facial ROM: Reduced right Facial Symmetry: Right droop Facial Strength: Reduced Facial Sensation: Within Functional Limits Velum: Within Functional Limits Mandible: Impaired   Ice Chips Ice chips: Within functional limits   Thin Liquid Thin Liquid: Within functional limits Presentation: Cup;Straw    Nectar Thick Nectar Thick Liquid: Not tested   Honey Thick Honey Thick Liquid: Not tested   Puree Puree: Within functional limits   Solid       Solid: Impaired Presentation: Self Fed Oral Phase Functional Implications: Oral residue;Other (comment)       Habeeb Puertas,PAT, M.S., CCC-SLP 02/19/2015,11:10 AM

## 2015-02-20 DIAGNOSIS — E039 Hypothyroidism, unspecified: Secondary | ICD-10-CM

## 2015-02-20 DIAGNOSIS — I69359 Hemiplegia and hemiparesis following cerebral infarction affecting unspecified side: Secondary | ICD-10-CM

## 2015-02-20 DIAGNOSIS — I69328 Other speech and language deficits following cerebral infarction: Secondary | ICD-10-CM

## 2015-02-20 NOTE — Clinical Social Work Note (Signed)
Clinical Social Work Assessment  Patient Details  Name: Linda [name] MRN: [id number] Date of Birth: 11-01-1928  Date of referral:  02/19/15               Reason for consult:  Facility Placement                Permission sought to share information with:  Family Supports Permission granted to share information::  Yes, Verbal Permission Granted  Name::        Agency::     Relationship::     Contact Information:     Housing/Transportation Living arrangements for the past 2 months:  Single Family Home Source of Information:  Adult Children (Patient's son was present in the room) Patient Interpreter Needed:  None Criminal Activity/Legal Involvement Pertinent to Current Situation/Hospitalization:  No - Comment as needed Significant Relationships:  Adult Children Lives with:  Self Do you feel safe going back to the place where you live?  Yes Need for family participation in patient care:  Yes (Comment)  Care giving concerns: CSW referred by [facility] to discuss with family the possiblity of SNF placement. CSW reviewed patient chart and identified that patient could consider SNF placement but would have to pay for these services out of pocket.    Social Worker assessment / plan: CSW met with patient who was present with her son and a minor family member. CSW spoke extensively with son about patient placement options. CSW explaind that PT recommended 24 hour care or SNF but MD had discharged patient with Home Health recommendation. CSW shared with son further that if the family desired SNF placement, they would have to pay for these services out of pocket. Patient's son identified that this would cost 237 per day and that they as a family could not afford that. CSW also identified that if family was interested in hiring a sitter at home those sevices are typically not covered by insurance, but the [facility] would have more information and could provide greater clarity. At this time family is interested  in getting details about home placement options such as HH and sitters. Family also would like someone to contact their insurance company in order to get more information about their coverage for services.   Employment status:  Retired Forensic scientist:  Medicare PT Recommendations:  [place] / Referral to community resources:  Other (Comment Required) (Refer to [facility])  Patient/Family's Response to care: Family would prefer SNF placement or for their insurance to pay for a sitter at home and currently disappointed so they have questions that they would like support with.   Patient/Family's Understanding of and Emotional Response to Diagnosis, Current Treatment, and Prognosis: Family identified concern with patient not getting much support because she keeps making progressive improvements.   CSW contacted RNCM to make her aware. No further CSW needs CSW signing off.   Emotional Assessment Appearance:  Appears stated age Attitude/Demeanor/Rapport:   (Quiet; observant) Affect (typically observed):  Flat, Appropriate Orientation:  Oriented to Self, Oriented to Place, Oriented to Situation, Oriented to  Time (Patient Aphasic with difficulty communicating) Alcohol / Substance use:  Not Applicable Psych involvement (Current and /or in the community):  No (Comment)  Discharge Needs  Concerns to be addressed:  Care Coordination, Discharge Planning Concerns, Financial / Insurance Concerns Readmission within the last 30 days:  No Current discharge risk:  Lack of support system Barriers to Discharge:  No Barriers Identified   Raveena Hebdon J, LCSW  02/20/2015, 1:04 PM

## 2015-02-20 NOTE — Progress Notes (Signed)
DC instructions given to patient and her son. All questions answered. Patient ready to be assisted with dressing and escorted in [facility] to lobby by staff. Son to transport home in private vehicle.

## 2015-02-20 NOTE — Care Management Note (Addendum)
Case Management Note  Patient Details  Name: MAKENNA MACALUSO MRN: [id number] Date of Birth: September 16, 1928  Subjective/Objective:                   CVA (cerebral infarction) - right-sided numbness and weakness. Action/Plan:  Discharge  planning Expected Discharge Date:                  Expected Discharge Plan:  [place]  In-House Referral:     Discharge planning Services  CM Consult  Post Acute Care Choice:    Choice offered to:  Patient, Adult Children  DME Arranged:    DME Agency:     HH Arranged:  PT, OT, Speech Therapy [place] Agency:  [facility]  Status of Service:  In process, will continue to follow  Medicare Important Message Given:    Date Medicare IM Given:    Medicare IM give by:    Date Additional Medicare IM Given:    Additional Medicare Important Message give by:     If discussed at [place] of Stay Meetings, dates discussed:    Additional Comments: CM met with pt who is not able to articulate her wishes; however, through nodding I was given permission to call her son, Camber Ninh, [phone] to discuss post hospitalization.  John states pt will be going to his brother's home and will bring the address to the hospital.  CM asked for confirmation of home health vs. SNF which was recc. By PT.  John states he would like to discuss [facility] as an option when he comes to the hospital.  Cm placed a call to CSW to please have this discussion.  CM will monitor for decision of disposition.  CM received test from CSW, Twann who states family will be taking pt home. CM met with pt and   Jenny Reichmann and gave them a List of [employer] and Jenny Reichmann verbalized understanding this is an out-of-pocket expense.  Referral called  To [facility] rep, Tiffany for HHPT/OT/SLP/Aide with appropriate discharge address of [place] and Krislynn Gronau [phone] [address] [place], [place] [address].   No other CM needs were communicated. Dellie Catholic, RN 02/20/2015, 11:08 AM

## 2015-02-20 NOTE — Progress Notes (Signed)
Son asking what is left until DC. Educated that everything is complete except for escort to lobby. Requested that secretary find available staff to escort , as patient and family have been waiting.

## 2015-02-20 NOTE — Discharge Summary (Addendum)
Physician Discharge Summary  Linda [name] DGL:[id number] DOB: [birth year] DOA: 02/18/2015  PCP: [name], MD  Admit date: 02/18/2015 Discharge date: 02/20/2015  Time spent: 35 minutes  Recommendations for Outpatient Follow-up:  1. 24 hour supervision by family  Discharge Diagnoses:  Principal Problem:   CVA (cerebral infarction) Active Problems:   Essential hypertension   Hypothyroidism   Paroxysmal atrial fibrillation   Hemiparesis and speech and language deficit as late effects of stroke   Discharge Condition: improved  Diet recommendation: DYS 3  Filed Weights   02/19/15 0145  Weight: 41.3 kg (91 lb 0.8 oz)    History of present illness:  Linda [name] is a 81 y.o. female with Past medical history of recent CVA of the left MCA territory, A. fib, hypothyroidism. The patient was brought in by family today as she was found to have some increasing numbness of the right side involving both upper and lower leg. She also has numbness of both lower leg. She also had some speech difficulties as well. No fall no trauma no injury reported. The patient was discharged from nursing home recently. The patient was recently hospitalized for CVA and was discharged to nursing home. He reportedly by family the patient's eliquis was not available from the pharmacy due to insurance issues and she was also not taking any aspirin. No nausea no vomiting or loss of control of bowel or bladder reported. The patient denies any burning urination. No other changes in her medications reported as well.  The patient is coming from home. And at her baseline independent for most of her ADL.  Hospital Course:  CVA (cerebral infarction) - right-sided numbness and weakness. JJO:ACZYSAYT ischemic left PCA territory infarct involving the left occipital lobe.  -PT/OT/SLP- spoke with family- can arrange 24 hour care at home -recent echo, carotid, FLP: LDL < 70 in 12/2014  and HgbA1C Resume  anticoagulation- son was able to pick up a few days ago  History of A. fib. Continuing anticoagulation.  History of essential hypertension. Permissive hypertension at present. Avoiding blood pressure medications.  Hypothyroidism. Continuing levothyroxine.   Procedures:    Consultations:  neuro  Discharge Exam: Filed Vitals:   02/20/15 0520  BP: 109/74  Pulse: 64  Temp: 98.1 F (36.7 C)  Resp: 16    General: some speech difficulty Cardiovascular: rrr Respiratory: clear  Discharge Instructions   Discharge Instructions    Discharge instructions    Complete by:  As directed   24 hour care by family Home health DYS 3 diet     Increase activity slowly    Complete by:  As directed           Current Discharge Medication List    CONTINUE these medications which have NOT CHANGED   Details  acetaminophen (TYLENOL) 500 MG tablet Take 1,000 mg by mouth every 6 (six) hours as needed. Usually only takes 2 x a  day    apixaban (ELIQUIS) 5 MG TABS tablet Take 1 tablet (5 mg total) by mouth 2 (two) times daily. Please discontinue Aspirin once started on Eliquis! Qty: 60 tablet, Refills: 3    calcium gluconate 500 MG tablet Take 500 mg by mouth 2 (two) times daily. With 1000 units D3    cholecalciferol (VITAMIN D) 1000 UNITS tablet Take 1,000 Units by mouth 2 (two) times daily. Take with calium    Cranberry 200 MG CAPS Take 1 capsule by mouth daily with lunch.     denosumab (PROLIA) 60  MG/ML SOLN injection Inject 60 mg into the skin every 6 (six) months. Administer in upper arm, thigh, or abdomen    diphenhydrAMINE (SOMINEX) 25 MG tablet Take 25 mg by mouth at bedtime as needed for sleep.    Flaxseed, Linseed, (FLAX SEED OIL) 1000 MG CAPS Take 1 capsule by mouth 2 (two) times daily.     hyoscyamine (ANASPAZ) 0.125 MG TBDP disintergrating tablet Place 0.125 mg under the tongue 2 (two) times daily as needed for bladder spasms.    levothyroxine (SYNTHROID,  LEVOTHROID) 50 MCG tablet Take 50 mcg by mouth every morning.     meclizine (ANTIVERT) 25 MG tablet Take 25 mg by mouth daily as needed for dizziness.     metoprolol succinate (TOPROL-XL) 25 MG 24 hr tablet Take 25 mg by mouth daily.    Multiple Vitamins-Minerals (CENTRUM SILVER PO) Take 1 tablet by mouth daily with lunch.     ondansetron (ZOFRAN) 4 MG tablet Take 1 tablet (4 mg total) by mouth every 8 (eight) hours as needed for nausea or vomiting. Qty: 20 tablet, Refills: 0    pantoprazole sodium (PROTONIX) 40 mg/20 mL PACK Take 20 mLs (40 mg total) by mouth daily. Qty: 30 each, Refills: 4    Propylene Glycol (SYSTANE BALANCE OP) Apply 1 drop to eye 2 (two) times daily.    rOPINIRole (REQUIP) 0.25 MG tablet TAKE 1 TABLET BY MOUTH 3 TIMES A DAY Qty: 270 tablet, Refills: 2    sennosides-docusate sodium (SENOKOT-S) 8.6-50 MG tablet Take 2 tablets by mouth daily.      STOP taking these medications     metoprolol tartrate (LOPRESSOR) 25 MG tablet        Allergies  Allergen Reactions  . Ibuprofen Anaphylaxis    Passed out and had to be hospitalized  . Penicillins     Skin on head was crawling and very very sleepy  . Demerol [Meperidine Hcl] Nausea And Vomiting  . Dilaudid [Hydromorphone Hcl] Nausea And Vomiting  . Fentanyl   . Lidoderm [Lidocaine]   . Lyrica [Pregabalin]     "made me a zombie'  . Morphine And Related Nausea And Vomiting  . Other     hamburger  . Percocet [Oxycodone-Acetaminophen] Nausea And Vomiting  . Tramadol   . Vicodin [Hydrocodone-Acetaminophen] Nausea And Vomiting   Follow-up Information    Follow up with [name], MD In 1 week.   Specialty:  Internal Medicine   Contact information:   [address] [place] [address] [phone]        The results of significant diagnostics from this hospitalization (including imaging, microbiology, ancillary and laboratory) are listed below for reference.    Significant  Diagnostic Studies: Ct Head Wo Contrast  02/18/2015   CLINICAL DATA:  Right hand numbness and tingling ; facial and bilateral lower extremity numbness  EXAM: CT HEAD WITHOUT CONTRAST  TECHNIQUE: Contiguous axial images were obtained from the base of the skull through the vertex without intravenous contrast.  COMPARISON:  December 25, 2014 head CT and brain MRI  FINDINGS: There is moderate diffuse atrophy. There is no intracranial mass, hemorrhage, extra-axial fluid collection, or midline shift. There is evidence of an acute infarct in the inferior, medial left occipital lobe with cytotoxic edema in this area. There is evidence of a prior infarct in the left superior temporal lobe extending to the left temporal -occipital junction. There is an infarct in the posterior right frontal lobe which appears slightly larger than on  recent prior studies. There is patchy small vessel disease in the centra semiovale bilaterally.  Bony calvarium appears intact. There is patchy mastoid disease bilaterally.  IMPRESSION: Findings consistent with an acute infarct in the left inferior, medial occipital lobe. Prior left temporal and posterior right frontal lobe infarcts. The posterior right frontal infarct may have extended since recent studies and may have an acute component superimposed on chronic change. Atrophy with mild small vessel disease. No hemorrhage or mass effect. Patchy ethmoid sinus disease bilaterally.  These results were called by telephone at the time of interpretation on 02/18/2015 at 10:07 pm to Dr. Elnora Morrison , who verbally acknowledged these results.   Electronically Signed   By: Lowella Grip III M.D.   On: 02/18/2015 22:08   Mr Brain Wo Contrast  02/19/2015   CLINICAL DATA:  81 year old female with history of recent stroke secondary to AFib with resultant right-sided weakness and expressive aphasia, now presenting with increased right upper extremity weakness and sensory deficits.  EXAM: MRI HEAD WITHOUT  CONTRAST  TECHNIQUE: Multiplanar, multiecho pulse sequences of the brain and surrounding structures were obtained without intravenous contrast.  COMPARISON:  Prior CT from earlier the same day as well as previous MRI from 12/25/2014  FINDINGS: There has been interval evolution of recently identified left MCA territory infarct involving the left insular cortex and operculum. This area of infarction now demonstrates encephalomalacia with associated laminar necrosis. There is persistent DWI signal abnormality about the margins of this infarction. In addition, there has been interval evolution of a right MCA territory infarct involving the anterior right frontal lobe which now demonstrates increased encephalomalacia as compared to the previous study. There is persistent DWI signal abnormality around this area of infarction is well. Both of these infarcts were present on the previous MRI from 12/25/2014.  There is a new ischemic infarct involving the left occipital lobe and the left PCA territory (series 4, image 20). This appears to be subacute in nature with fairly heterogeneous DWI signal intensity and normalized ADC signal, with associated early laminar necrosis and encephalomalacia.  No other infarct identified. No acute or chronic intracranial hemorrhage. Normal intravascular flow voids are preserved.  No mass lesion, mass effect, or midline shift.  Generalized cerebral atrophy with mild chronic microvascular ischemic disease noted. Ventricular prominence is stable from previous study, likely related to global parenchymal volume loss. No hydrocephalus. No extra-axial fluid collection.  Craniocervical junction within normal limits. Visualized upper cervical spine demonstrates no acute abnormality. Pituitary gland normal. No acute abnormality about the orbits.  Mild mucosal thickening present within the ethmoidal air cells. Minimal fluid opacity present within the left mastoid air cells posteriorly. Inner ear  structures normal.  Bone marrow signal intensity within normal limits. Scalp soft tissues within normal limits.  IMPRESSION: 1. Findings consistent with subacute ischemic left PCA territory infarct involving the left occipital lobe. 2. Interval evolution of late subacute to chronic bilateral MCA territory infarcts involving both the left and right frontal lobes as above. No acute component identified about these infarcts. 3. Moderate generalized cerebral atrophy.   Electronically Signed   By: Jeannine Boga M.D.   On: 02/19/2015 01:21    Microbiology: Recent Results (from the past 240 hour(s))  Clostridium Difficile by PCR (not at [facility])     Status: None   Collection Time: 02/19/15  2:55 AM  Result Value Ref Range Status   C difficile by pcr NEGATIVE NEGATIVE Final     Labs: Basic Metabolic Panel:  Recent Labs Lab 02/18/15 2006 02/19/15 0535  NA 140 139  K 4.2 3.7  CL 102 102  CO2 30 30  GLUCOSE 95 100*  BUN 23* 19  CREATININE 1.17* 1.01*  CALCIUM 9.8 9.6   Liver Function Tests:  Recent Labs Lab 02/18/15 2006 02/19/15 0535  AST 31 26  ALT 31 25  ALKPHOS 109 88  BILITOT 0.5 0.7  PROT 6.7 6.2*  ALBUMIN 3.6 3.3*   No results for input(s): LIPASE, AMYLASE in the last 168 hours. No results for input(s): AMMONIA in the last 168 hours. CBC:  Recent Labs Lab 02/18/15 2006 02/19/15 0535  WBC 7.7 7.2  NEUTROABS 4.4 4.4  HGB 15.6* 14.0  HCT 48.7* 43.1  MCV 96.1 94.1  PLT 271 240   Cardiac Enzymes: No results for input(s): CKTOTAL, CKMB, CKMBINDEX, TROPONINI in the last 168 hours. BNP: BNP (last 3 results) No results for input(s): BNP in the last 8760 hours.  ProBNP (last 3 results) No results for input(s): PROBNP in the last 8760 hours.  CBG: No results for input(s): GLUCAP in the last 168 hours.     SignedEulogio Bear  [facility] 02/20/2015, 9:31 AM

## 2015-02-22 DIAGNOSIS — I69320 Aphasia following cerebral infarction: Secondary | ICD-10-CM | POA: Diagnosis not present

## 2015-02-22 DIAGNOSIS — I69351 Hemiplegia and hemiparesis following cerebral infarction affecting right dominant side: Secondary | ICD-10-CM | POA: Diagnosis not present

## 2015-02-22 DIAGNOSIS — M150 Primary generalized (osteo)arthritis: Secondary | ICD-10-CM | POA: Diagnosis not present

## 2015-02-22 DIAGNOSIS — I10 Essential (primary) hypertension: Secondary | ICD-10-CM | POA: Diagnosis not present

## 2015-02-22 DIAGNOSIS — M810 Age-related osteoporosis without current pathological fracture: Secondary | ICD-10-CM | POA: Diagnosis not present

## 2015-02-22 DIAGNOSIS — I4891 Unspecified atrial fibrillation: Secondary | ICD-10-CM | POA: Diagnosis not present

## 2015-02-22 DIAGNOSIS — D649 Anemia, unspecified: Secondary | ICD-10-CM | POA: Diagnosis not present

## 2015-02-24 DIAGNOSIS — I10 Essential (primary) hypertension: Secondary | ICD-10-CM | POA: Diagnosis not present

## 2015-02-24 DIAGNOSIS — I69351 Hemiplegia and hemiparesis following cerebral infarction affecting right dominant side: Secondary | ICD-10-CM | POA: Diagnosis not present

## 2015-02-24 DIAGNOSIS — I4891 Unspecified atrial fibrillation: Secondary | ICD-10-CM | POA: Diagnosis not present

## 2015-02-24 DIAGNOSIS — M150 Primary generalized (osteo)arthritis: Secondary | ICD-10-CM | POA: Diagnosis not present

## 2015-02-24 DIAGNOSIS — I69320 Aphasia following cerebral infarction: Secondary | ICD-10-CM | POA: Diagnosis not present

## 2015-02-24 DIAGNOSIS — M810 Age-related osteoporosis without current pathological fracture: Secondary | ICD-10-CM | POA: Diagnosis not present

## 2015-02-25 ENCOUNTER — Emergency Department (HOSPITAL_COMMUNITY): Payer: Medicare Other

## 2015-02-25 ENCOUNTER — Encounter (HOSPITAL_COMMUNITY): Payer: Self-pay | Admitting: Nurse Practitioner

## 2015-02-25 ENCOUNTER — Telehealth: Payer: Self-pay | Admitting: Cardiology

## 2015-02-25 ENCOUNTER — Emergency Department (HOSPITAL_COMMUNITY)
Admission: EM | Admit: 2015-02-25 | Discharge: 2015-02-26 | Disposition: A | Discharge status: Home / Self Care | Payer: Medicare Other | Attending: Emergency Medicine | Admitting: Emergency Medicine

## 2015-02-25 DIAGNOSIS — Z862 Personal history of diseases of the blood and blood-forming organs and certain disorders involving the immune mechanism: Secondary | ICD-10-CM | POA: Insufficient documentation

## 2015-02-25 DIAGNOSIS — R05 Cough: Secondary | ICD-10-CM | POA: Insufficient documentation

## 2015-02-25 DIAGNOSIS — J3489 Other specified disorders of nose and nasal sinuses: Secondary | ICD-10-CM | POA: Diagnosis not present

## 2015-02-25 DIAGNOSIS — R200 Anesthesia of skin: Secondary | ICD-10-CM | POA: Diagnosis not present

## 2015-02-25 DIAGNOSIS — M810 Age-related osteoporosis without current pathological fracture: Secondary | ICD-10-CM | POA: Diagnosis not present

## 2015-02-25 DIAGNOSIS — R109 Unspecified abdominal pain: Secondary | ICD-10-CM | POA: Insufficient documentation

## 2015-02-25 DIAGNOSIS — Z8781 Personal history of (healed) traumatic fracture: Secondary | ICD-10-CM | POA: Insufficient documentation

## 2015-02-25 DIAGNOSIS — Z8679 Personal history of other diseases of the circulatory system: Secondary | ICD-10-CM | POA: Insufficient documentation

## 2015-02-25 DIAGNOSIS — Z8673 Personal history of transient ischemic attack (TIA), and cerebral infarction without residual deficits: Secondary | ICD-10-CM | POA: Diagnosis not present

## 2015-02-25 DIAGNOSIS — K5900 Constipation, unspecified: Secondary | ICD-10-CM | POA: Diagnosis not present

## 2015-02-25 DIAGNOSIS — G2581 Restless legs syndrome: Secondary | ICD-10-CM | POA: Insufficient documentation

## 2015-02-25 DIAGNOSIS — I4891 Unspecified atrial fibrillation: Secondary | ICD-10-CM | POA: Diagnosis not present

## 2015-02-25 DIAGNOSIS — M1990 Unspecified osteoarthritis, unspecified site: Secondary | ICD-10-CM | POA: Insufficient documentation

## 2015-02-25 DIAGNOSIS — Z79899 Other long term (current) drug therapy: Secondary | ICD-10-CM | POA: Insufficient documentation

## 2015-02-25 DIAGNOSIS — R9431 Abnormal electrocardiogram [ECG] [EKG]: Secondary | ICD-10-CM | POA: Diagnosis not present

## 2015-02-25 DIAGNOSIS — I69351 Hemiplegia and hemiparesis following cerebral infarction affecting right dominant side: Secondary | ICD-10-CM | POA: Diagnosis not present

## 2015-02-25 DIAGNOSIS — J90 Pleural effusion, not elsewhere classified: Secondary | ICD-10-CM | POA: Diagnosis not present

## 2015-02-25 DIAGNOSIS — G253 Myoclonus: Secondary | ICD-10-CM | POA: Insufficient documentation

## 2015-02-25 DIAGNOSIS — I6789 Other cerebrovascular disease: Secondary | ICD-10-CM | POA: Diagnosis not present

## 2015-02-25 DIAGNOSIS — Z9889 Other specified postprocedural states: Secondary | ICD-10-CM | POA: Diagnosis not present

## 2015-02-25 DIAGNOSIS — Z8719 Personal history of other diseases of the digestive system: Secondary | ICD-10-CM | POA: Insufficient documentation

## 2015-02-25 DIAGNOSIS — Z8742 Personal history of other diseases of the female genital tract: Secondary | ICD-10-CM | POA: Diagnosis not present

## 2015-02-25 DIAGNOSIS — I10 Essential (primary) hypertension: Secondary | ICD-10-CM | POA: Diagnosis not present

## 2015-02-25 DIAGNOSIS — E039 Hypothyroidism, unspecified: Secondary | ICD-10-CM | POA: Insufficient documentation

## 2015-02-25 DIAGNOSIS — G8929 Other chronic pain: Secondary | ICD-10-CM | POA: Insufficient documentation

## 2015-02-25 DIAGNOSIS — I69320 Aphasia following cerebral infarction: Secondary | ICD-10-CM | POA: Diagnosis not present

## 2015-02-25 DIAGNOSIS — I63532 Cerebral infarction due to unspecified occlusion or stenosis of left posterior cerebral artery: Secondary | ICD-10-CM | POA: Diagnosis not present

## 2015-02-25 DIAGNOSIS — M150 Primary generalized (osteo)arthritis: Secondary | ICD-10-CM | POA: Diagnosis not present

## 2015-02-25 DIAGNOSIS — Z880 Allergy status to penicillin: Secondary | ICD-10-CM | POA: Insufficient documentation

## 2015-02-25 DIAGNOSIS — I63512 Cerebral infarction due to unspecified occlusion or stenosis of left middle cerebral artery: Secondary | ICD-10-CM | POA: Diagnosis not present

## 2015-02-25 LAB — I-STAT TROPONIN, ED: Troponin i, poc: 0 ng/mL (ref 0.00–0.08)

## 2015-02-25 LAB — COMPREHENSIVE METABOLIC PANEL
ALBUMIN: 3.5 g/dL (ref 3.5–5.0)
ALT: 42 U/L (ref 14–54)
AST: 33 U/L (ref 15–41)
Alkaline Phosphatase: 108 U/L (ref 38–126)
Anion gap: 6 (ref 5–15)
BUN: 28 mg/dL — ABNORMAL HIGH (ref 6–20)
CALCIUM: 9.2 mg/dL (ref 8.9–10.3)
CO2: 27 mmol/L (ref 22–32)
CREATININE: 1.26 mg/dL — AB (ref 0.44–1.00)
Chloride: 107 mmol/L (ref 101–111)
GFR calc Af Amer: 44 mL/min — ABNORMAL LOW (ref >60)
GFR, EST NON AFRICAN AMERICAN: 38 mL/min — AB (ref >60)
Glucose, Bld: 109 mg/dL — ABNORMAL HIGH (ref 65–99)
Potassium: 4.1 mmol/L (ref 3.5–5.1)
SODIUM: 140 mmol/L (ref 135–145)
Total Bilirubin: 0.4 mg/dL (ref 0.3–1.2)
Total Protein: 5.9 g/dL — ABNORMAL LOW (ref 6.5–8.1)

## 2015-02-25 LAB — CBC
HCT: 42.3 % (ref 36.0–46.0)
Hemoglobin: 13.8 g/dL (ref 12.0–15.0)
MCH: 30.7 pg (ref 26.0–34.0)
MCHC: 32.6 g/dL (ref 30.0–36.0)
MCV: 94.2 fL (ref 78.0–100.0)
PLATELETS: 222 10*3/uL (ref 150–400)
RBC: 4.49 MIL/uL (ref 3.87–5.11)
RDW: 13.3 % (ref 11.5–15.5)
WBC: 6.3 10*3/uL (ref 4.0–10.5)

## 2015-02-25 LAB — DIFFERENTIAL
BASOS ABS: 0.1 10*3/uL (ref 0.0–0.1)
BASOS PCT: 1 % (ref 0–1)
Eosinophils Absolute: 0.2 10*3/uL (ref 0.0–0.7)
Eosinophils Relative: 3 % (ref 0–5)
Lymphocytes Relative: 23 % (ref 12–46)
Lymphs Abs: 1.5 10*3/uL (ref 0.7–4.0)
Monocytes Absolute: 0.7 10*3/uL (ref 0.1–1.0)
Monocytes Relative: 11 % (ref 3–12)
NEUTROS ABS: 4 10*3/uL (ref 1.7–7.7)
Neutrophils Relative %: 62 % (ref 43–77)

## 2015-02-25 LAB — I-STAT CHEM 8, ED
BUN: 30 mg/dL — ABNORMAL HIGH (ref 6–20)
Calcium, Ion: 1.21 mmol/L (ref 1.13–1.30)
Chloride: 104 mmol/L (ref 101–111)
Creatinine, Ser: 1.3 mg/dL — ABNORMAL HIGH (ref 0.44–1.00)
GLUCOSE: 105 mg/dL — AB (ref 65–99)
HCT: 43 % (ref 36.0–46.0)
Hemoglobin: 14.6 g/dL (ref 12.0–15.0)
POTASSIUM: 4 mmol/L (ref 3.5–5.1)
Sodium: 141 mmol/L (ref 135–145)
TCO2: 25 mmol/L (ref 0–100)

## 2015-02-25 LAB — PROTIME-INR
INR: 1.4 (ref 0.00–1.49)
Prothrombin Time: 17.2 seconds — ABNORMAL HIGH (ref 11.6–15.2)

## 2015-02-25 LAB — APTT: aPTT: 33 seconds (ref 24–37)

## 2015-02-25 NOTE — ED Provider Notes (Signed)
CSN: [id number]     Arrival date & time 02/25/15  2222 History  This chart was scribed for Linda Balls, MD by Helane Gunther, ED Scribe. This patient was seen in room A13C/A13C and the patient's care was started at 11:24 PM.    Chief Complaint  Patient presents with  . Cerebrovascular Accident   The history is provided by the patient and a relative (sons). No language interpreter was used.   HPI Comments: Linda [name] is a 81 y.o. female with PMHx of CVA and TIA who presents to the Emergency Department complaining of RUE numbness onset at  8:50 tonight. She reports numbness from the ellbow down to her hand, which has improved since onset. Per sons, pt has been experiencing rhinorrhea, cough, abdominal pain, and mild constipation. She had a TIA 1 week ago, and prior to that she had a stroke. She has residual speech deficits and residual right-sided deficits. She denies current n/v/d, fever, dysuria, as well as any pain, or trouble with BMs. Family states the patient is able to ambulate on her own ay baseline.   Past Medical History  Diagnosis Date  . History of cardiac catheterization 2002    Negative  . Vertebral fracture   . Restless legs syndrome with nocturnal myoclonus 05/13/2013     [place]ip controlled   . Abnormal uterine bleeding [id number]  . Hypothyroidism   . Arthritis   . Osteoporosis   . History of stomach ulcers   . History of jaundice as a child   . Anemia   . Head pain, chronic     "I have pain in the back of my head for months"  . Dizziness     TAKES MECLIZINE TO TREAT  . Hemiparesis and speech and language deficit as late effects of stroke 02/09/2015   Past Surgical History  Procedure Laterality Date  . Back surgery      x 3  . Breast surgery Bilateral     1 lump each breast removed and benign  . Nasal sinus surgery    . Dilation and curettage of uterus  1970  . Catheterization  2003    cardiac cath  . Kyphoplasty  2010  . Cataract extraction Bilateral 2011   . Cholecystectomy  1995    Lap  . Cardiac catheterization  2002    "it was normal" per pt - no record found  . Robotic assisted salpingo oopherectomy Bilateral 11/02/2014    Procedure: ROBOTIC ASSISTED bilateral SALPINGO OOPHORECTOMY;  Surgeon: Everitt Amber, MD;  Location: [facility];  Service: Gynecology;  Laterality: Bilateral;  . Laparoscopy N/A 11/02/2014    Procedure: LAPAROSCOPY DIAGNOSTIC;  Surgeon: Everitt Amber, MD;  Location: [facility];  Service: Gynecology;  Laterality: N/A;   Family History  Problem Relation Age of Onset  . Asthma Brother   . Osteoarthritis Son   . Hypertension Father   . Stroke Father   . Cancer Brother     stomach   History  Substance Use Topics  . Smoking status: Never Smoker   . Smokeless tobacco: Never Used  . Alcohol Use: No   OB History    Gravida Para Term Preterm AB TAB SAB Ectopic Multiple Living   2 2       1 3      Review of Systems  A complete 10 system review of systems was obtained and all systems are negative except as noted in the HPI and PMH.   Allergies  Ibuprofen; Penicillins; Demerol;  Dilaudid; Fentanyl; Lidoderm; Lyrica; Morphine and related; Other; Percocet; Tramadol; and Vicodin  Home Medications   Prior to Admission medications   Medication Sig Start Date End Date Taking? Authorizing Provider  acetaminophen (TYLENOL) 500 MG tablet Take 1,000 mg by mouth every 6 (six) hours as needed. Usually only takes 2 x a  day    Historical Provider, MD  apixaban (ELIQUIS) 5 MG TABS tablet Take 1 tablet (5 mg total) by mouth 2 (two) times daily. Please discontinue Aspirin once started on Eliquis! 02/15/15   Kathrynn Ducking, MD  calcium gluconate 500 MG tablet Take 500 mg by mouth 2 (two) times daily. With 1000 units D3    Historical Provider, MD  cholecalciferol (VITAMIN D) 1000 UNITS tablet Take 1,000 Units by mouth 2 (two) times daily. Take with calium    Historical Provider, MD  Cranberry 200 MG CAPS Take 1 capsule by mouth daily with lunch.      Historical Provider, MD  denosumab (PROLIA) 60 MG/ML SOLN injection Inject 60 mg into the skin every 6 (six) months. Administer in upper arm, thigh, or abdomen    Historical Provider, MD  diphenhydrAMINE (SOMINEX) 25 MG tablet Take 25 mg by mouth at bedtime as needed for sleep.    Historical Provider, MD  Flaxseed, Linseed, (FLAX SEED OIL) 1000 MG CAPS Take 1 capsule by mouth 2 (two) times daily.     Historical Provider, MD  hyoscyamine (ANASPAZ) 0.125 MG TBDP disintergrating tablet Place 0.125 mg under the tongue 2 (two) times daily as needed for bladder spasms.    Historical Provider, MD  levothyroxine (SYNTHROID, LEVOTHROID) 50 MCG tablet Take 50 mcg by mouth every morning.     Historical Provider, MD  meclizine (ANTIVERT) 25 MG tablet Take 25 mg by mouth daily as needed for dizziness.  05/07/13   Historical Provider, MD  metoprolol succinate (TOPROL-XL) 25 MG 24 hr tablet Take 25 mg by mouth daily.    Historical Provider, MD  Multiple Vitamins-Minerals (CENTRUM SILVER PO) Take 1 tablet by mouth daily with lunch.     Historical Provider, MD  ondansetron (ZOFRAN) 4 MG tablet Take 1 tablet (4 mg total) by mouth every 8 (eight) hours as needed for nausea or vomiting. 11/02/14   Lahoma Crocker, MD  pantoprazole sodium (PROTONIX) 40 mg/20 mL PACK Take 20 mLs (40 mg total) by mouth daily. 02/11/15   Gildardo Cranker, DO  Propylene Glycol (SYSTANE BALANCE OP) Apply 1 drop to eye 2 (two) times daily.    Historical Provider, MD  rOPINIRole (REQUIP) 0.25 MG tablet TAKE 1 TABLET BY MOUTH 3 TIMES A DAY 09/10/14   Asencion Partridge Dohmeier, MD  sennosides-docusate sodium (SENOKOT-S) 8.6-50 MG tablet Take 2 tablets by mouth daily.    Historical Provider, MD   BP 131/70 mmHg  Pulse 70  Temp(Src) 98.1 F (36.7 C) (Oral)  Resp 19  Ht 5\' 3"  (1.6 m)  Wt 91 lb (41.277 kg)  BMI 16.12 kg/m2  SpO2 98%  LMP 09/03/1996 (Approximate) Physical Exam  Constitutional: She is oriented to person, place, and time. She appears  well-developed and well-nourished. No distress.  HENT:  Head: Normocephalic and atraumatic.  Nose: Nose normal.  Mouth/Throat: Oropharynx is clear and moist. No oropharyngeal exudate.  Eyes: Conjunctivae and EOM are normal. Pupils are equal, round, and reactive to light. No scleral icterus.  Neck: Normal range of motion. Neck supple. No JVD present. No tracheal deviation present. No thyromegaly present.  Cardiovascular: Normal rate, regular rhythm and normal  heart sounds.  Exam reveals no gallop and no friction rub.   No murmur heard. Pulmonary/Chest: Effort normal and breath sounds normal. No respiratory distress. She has no wheezes. She exhibits no tenderness.  Abdominal: Soft. Bowel sounds are normal. She exhibits no distension and no mass. There is no tenderness. There is no rebound and no guarding.  Musculoskeletal: Normal range of motion. She exhibits no edema or tenderness.  Lymphadenopathy:    She has no cervical adenopathy.  Neurological: She is alert and oriented to person, place, and time. No cranial nerve deficit. She exhibits normal muscle tone.  numbness RUE Strength 4/5 RUE and RLE Normal cerebellar testing  Skin: Skin is warm and dry. No rash noted. No erythema. No pallor.  Nursing note and vitals reviewed.   ED Course  Procedures  DIAGNOSTIC STUDIES: Oxygen Saturation is 98% on RA, normal by my interpretation.    COORDINATION OF CARE: 11:30 PM - Discussed plans to order an MRI and diagnostic studies, and consult with a neurologist about possible admission if symptoms persist. Pt advised of plan for treatment and pt agrees.  11:40 PM - Discussed case with neurologist.  Labs Review Labs Reviewed  PROTIME-INR - Abnormal; Notable for the following:    Prothrombin Time 17.2 (*)    All other components within normal limits  COMPREHENSIVE METABOLIC PANEL - Abnormal; Notable for the following:    Glucose, Bld 109 (*)    BUN 28 (*)    Creatinine, Ser 1.26 (*)    Total  Protein 5.9 (*)    GFR calc non Af Amer 38 (*)    GFR calc Af Amer 44 (*)    All other components within normal limits  I-STAT CHEM 8, ED - Abnormal; Notable for the following:    BUN 30 (*)    Creatinine, Ser 1.30 (*)    Glucose, Bld 105 (*)    All other components within normal limits  APTT  CBC  DIFFERENTIAL  URINALYSIS, ROUTINE W REFLEX MICROSCOPIC (NOT AT [facility])  Randolm Idol, ED    Imaging Review Dg Chest 2 View  02/26/2015   CLINICAL DATA:  Right hand numbness  EXAM: CHEST  2 VIEW  COMPARISON:  12/25/2014  FINDINGS: Chronic interstitial markings/ emphysematous changes. Small left pleural effusion, improved. No pneumothorax.  Cardiomegaly.  Degenerative changes of the visualized thoracolumbar spine. Mid/lower thoracic vertebral augmentation.  IMPRESSION: Small left pleural effusion, improved.   Electronically Signed   By: Julian Hy M.D.   On: 02/26/2015 00:57   Ct Head (brain) Wo Contrast  02/25/2015   CLINICAL DATA:  Acute onset of right forearm and hand numbness and tingling. Underlying symptoms from chronic infarct. Initial encounter.  EXAM: CT HEAD WITHOUT CONTRAST  TECHNIQUE: Contiguous axial images were obtained from the base of the skull through the vertex without intravenous contrast.  COMPARISON:  CT of the head performed 02/18/2015, and MRI of the brain performed 02/19/2015  FINDINGS: Evolving subacute to chronic infarcts are again noted at the MCA territories bilaterally, larger on the left, and also at the left PCA territory, along the left occipital lobe. These are similar in appearance to prior studies.  Underlying moderate cortical volume loss is noted. Mild cerebellar atrophy is seen. Scattered periventricular white matter change likely reflects small vessel ischemic microangiopathy.  The brainstem and fourth ventricle are within normal limits. No midline shift is seen.  There is no evidence of fracture; visualized osseous structures are unremarkable in  appearance. The orbits are  within normal limits. The paranasal sinuses and mastoid air cells are well-aerated. Trace air lateral to the right orbit and along the right cavernous sinus likely reflects air from an IV catheter.  IMPRESSION: 1. No acute intracranial pathology seen to explain new symptoms. 2. Evolving subacute to chronic infarcts at the MCA territories bilaterally, larger on the left, and also the left PCA territory, along the left occipital lobe. These are similar in appearance to prior studies. 3. Underlying moderate cortical volume loss noted. Scattered small vessel ischemic microangiopathy seen.   Electronically Signed   By: Garald Balding M.D.   On: 02/25/2015 23:27   Mr Brain Wo Contrast  02/26/2015   CLINICAL DATA:  RIGHT upper extremity numbness beginning at 8:15 tonight. History of TIA 1 week ago, prior stroke with residual RIGHT-sided deficits. History of headache, dizziness.  EXAM: MRI HEAD WITHOUT CONTRAST  TECHNIQUE: Multiplanar, multiecho pulse sequences of the brain and surrounding structures were obtained without intravenous contrast.  COMPARISON:  CT head February 25, 2015 at 2321 hours and MRI of the head February 19, 2015  FINDINGS: Faint reduced diffusion within the LEFT greater the RIGHT frontal lobes, LEFT occipital lobe evolving from prior examination with normalized ADC values. Associated FLAIR hyperintense signal, and minimal T1 shortening consistent with mineralization. No reduced diffusion to suggest acute ischemia. No susceptibility artifact to suggest hemorrhage. No midline shift, mass effect or mass lesions. Moderate to severe ventriculomegaly, on the basis of global parenchymal brain volume loss as there is overall commensurate enlargement of cerebral sulci and cerebellar folia. Patchy white matter changes exclusive of the aforementioned abnormality consistent with mild to moderate chronic small vessel ischemic disease.  No abnormal extra-axial fluid collections. Normal major  intracranial vascular flow voids seen at the skull base. Status post bilateral ocular lens implants. Trace ethmoid mucosal thickening, mastoid air cells are well aerated. No abnormal sellar expansion. No suspicious calvarial bone marrow signal. No cerebellar tonsillar ectopia.  IMPRESSION: No acute ischemia.  Early chronic bifrontal (MCA territory), LEFT occipital lobe (PCA territory) infarcts.  Involutional changes. Mild to moderate white matter changes compatible with chronic small vessel ischemic disease.   Electronically Signed   By: Elon Alas M.D.   On: 02/26/2015 01:20     EKG Interpretation   Date/Time:  Friday February 25 2015 22:31:55 EDT Ventricular Rate:  69 PR Interval:    QRS Duration: 110 QT Interval:  527 QTC Calculation: 565 R Axis:   53 Text Interpretation:  Atrial flutter with varied AV block, Low voltage,  extremity and precordial leads RSR' in V1 or V2, probably normal variant  Nonspecific T abnormalities, lateral leads Minimal ST elevation, inferior  leads Prolonged QT interval Baseline wander in lead(s) II Confirmed by  [place] ([id number]) on 02/25/2015 10:39:29 PM      MDM   Final diagnoses:  None  Patient presents to the ED for RUE numbness, concerning for repeat stroke.  Work up was started.  I spoke with neurology, Dr. Nicole Kindred, who recommends for MRI to evaluate for any acute changes.  She states her numbness has improved in the ED but is overall is present.  Will also obtain infectious work up as this can manifest as worsening of prior stroke symptoms.   MRI reveals no acute ischemia. Chronic infarcts were again visualized. At this time patient is not having any new acute findings of ischemia. Per neurology, this may be continuation of her prior infarcts causing residual numbness. Patient be advised to  follow neurology within 3 days. She otherwise appears well and in no acute distress. Her vital signs remain within her normal limits and she is safe for  discharge.   I personally performed the services described in this documentation, which was scribed in my presence. The recorded information has been reviewed and is accurate.   Linda Balls, MD 02/26/15 [phone]

## 2015-02-25 NOTE — Telephone Encounter (Signed)
Daughter in law called to report patient developed arm numbness similar to a "mini stroke" that occurred a week ago. I recommended that the patient come to the ER (via EMS) immediately for evaluation.

## 2015-02-25 NOTE — ED Notes (Signed)
Per EMS pt from home recently hospitalized and d/ced for CVA, was again d/ced yesterday for TIA. Pt has right sided facial droop, weakness, slurred speech and moderate aphasia as deficits from previous stroke. Today pt has sudden onset of right forearm and hand numbness and tingling that started at 0850pm this evening. Since then patient numbness has decreased but is still present. Patient alert able to state name and answer yes/no questions- EMS sts this is her baseline.

## 2015-02-25 NOTE — ED Notes (Signed)
Spoke to Dr. Tawnya Crook about patient will order stroke work up but agreed code stroke does not need to be called due to patients recent stroke and minimal new deficits.

## 2015-02-26 ENCOUNTER — Emergency Department (HOSPITAL_COMMUNITY): Payer: Medicare Other

## 2015-02-26 DIAGNOSIS — R200 Anesthesia of skin: Secondary | ICD-10-CM | POA: Diagnosis not present

## 2015-02-26 DIAGNOSIS — J90 Pleural effusion, not elsewhere classified: Secondary | ICD-10-CM | POA: Diagnosis not present

## 2015-02-26 NOTE — Discharge Instructions (Signed)
Paresthesia Ms. Linda [name], your MRI does not show any new stroke. This is likely a deficit from your prior stroke. See neurology within 3 days for close follow-up. If any symptoms worsen come back to emergency department immediately. Thank you. Paresthesia is a burning or prickling feeling. This feeling can happen in any part of the body. It often happens in the hands, arms, legs, or feet. HOME CARE  Avoid drinking alcohol.  Try massage or needle therapy (acupuncture) to help with your problems.  Keep all doctor visits as told. GET HELP RIGHT AWAY IF:   You feel weak.  You have trouble walking or moving.  You have problems speaking or seeing.  You feel confused.  You cannot control when you poop (bowel movement) or pee (urinate).  You lose feeling (numbness) after an injury.  You pass out (faint).  Your burning or prickling feeling gets worse when you walk.  You have pain, cramps, or feel dizzy.  You have a rash. MAKE SURE YOU:   Understand these instructions.  Will watch your condition.  Will get help right away if you are not doing well or get worse. Document Released: 08/02/2008 Document Revised: 11/12/2011 Document Reviewed: 05/11/2011 [facility] Patient Information 2015 [place], [place]. This information is not intended to replace advice given to you by your health care provider. Make sure you discuss any questions you have with your health care provider.

## 2015-02-28 ENCOUNTER — Other Ambulatory Visit: Payer: Self-pay

## 2015-02-28 ENCOUNTER — Telehealth: Payer: Self-pay

## 2015-02-28 DIAGNOSIS — M150 Primary generalized (osteo)arthritis: Secondary | ICD-10-CM | POA: Diagnosis not present

## 2015-02-28 DIAGNOSIS — I10 Essential (primary) hypertension: Secondary | ICD-10-CM | POA: Diagnosis not present

## 2015-02-28 DIAGNOSIS — I4891 Unspecified atrial fibrillation: Secondary | ICD-10-CM | POA: Diagnosis not present

## 2015-02-28 DIAGNOSIS — I69320 Aphasia following cerebral infarction: Secondary | ICD-10-CM | POA: Diagnosis not present

## 2015-02-28 DIAGNOSIS — I69351 Hemiplegia and hemiparesis following cerebral infarction affecting right dominant side: Secondary | ICD-10-CM | POA: Diagnosis not present

## 2015-02-28 DIAGNOSIS — M810 Age-related osteoporosis without current pathological fracture: Secondary | ICD-10-CM | POA: Diagnosis not present

## 2015-02-28 NOTE — Telephone Encounter (Signed)
Patient is seeing Hoyle Sauer.

## 2015-02-28 NOTE — Telephone Encounter (Signed)
Spoke to the son on the phone and was able to get the follow up appt for the mom scheduled for Thursday at 4 pm. He thanked me

## 2015-02-28 NOTE — Telephone Encounter (Signed)
I called the patient's son. Left voicemail.

## 2015-02-28 NOTE — Telephone Encounter (Signed)
Pt's son is calling stating that pt had a mini stroke last week and she was taken to the ER. The ER recommended a f/u appt. Pt's is requesting a late afternoon appt because he gets off of work at 3:30. Please call him if an appt can be made.

## 2015-03-01 ENCOUNTER — Telehealth: Payer: Self-pay | Admitting: *Deleted

## 2015-03-01 DIAGNOSIS — F329 Major depressive disorder, single episode, unspecified: Secondary | ICD-10-CM | POA: Diagnosis not present

## 2015-03-01 DIAGNOSIS — I4891 Unspecified atrial fibrillation: Secondary | ICD-10-CM | POA: Diagnosis not present

## 2015-03-01 DIAGNOSIS — I10 Essential (primary) hypertension: Secondary | ICD-10-CM | POA: Diagnosis not present

## 2015-03-01 DIAGNOSIS — M150 Primary generalized (osteo)arthritis: Secondary | ICD-10-CM | POA: Diagnosis not present

## 2015-03-01 DIAGNOSIS — I639 Cerebral infarction, unspecified: Secondary | ICD-10-CM | POA: Diagnosis not present

## 2015-03-01 DIAGNOSIS — M810 Age-related osteoporosis without current pathological fracture: Secondary | ICD-10-CM | POA: Diagnosis not present

## 2015-03-01 DIAGNOSIS — I69351 Hemiplegia and hemiparesis following cerebral infarction affecting right dominant side: Secondary | ICD-10-CM | POA: Diagnosis not present

## 2015-03-01 DIAGNOSIS — I69320 Aphasia following cerebral infarction: Secondary | ICD-10-CM | POA: Diagnosis not present

## 2015-03-01 NOTE — Telephone Encounter (Signed)
Spoke to the son on the phone who called in yesterday to schedule an appt, asked today to cancel the appt on Thursday with carolyn NP since she has an appt that day with another provider. He told me he would reschedule later

## 2015-03-02 DIAGNOSIS — I69320 Aphasia following cerebral infarction: Secondary | ICD-10-CM | POA: Diagnosis not present

## 2015-03-02 DIAGNOSIS — I69351 Hemiplegia and hemiparesis following cerebral infarction affecting right dominant side: Secondary | ICD-10-CM | POA: Diagnosis not present

## 2015-03-02 DIAGNOSIS — M150 Primary generalized (osteo)arthritis: Secondary | ICD-10-CM | POA: Diagnosis not present

## 2015-03-02 DIAGNOSIS — I10 Essential (primary) hypertension: Secondary | ICD-10-CM | POA: Diagnosis not present

## 2015-03-02 DIAGNOSIS — M810 Age-related osteoporosis without current pathological fracture: Secondary | ICD-10-CM | POA: Diagnosis not present

## 2015-03-02 DIAGNOSIS — I4891 Unspecified atrial fibrillation: Secondary | ICD-10-CM | POA: Diagnosis not present

## 2015-03-03 ENCOUNTER — Ambulatory Visit: Payer: Self-pay | Admitting: Nurse Practitioner

## 2015-03-03 DIAGNOSIS — I69351 Hemiplegia and hemiparesis following cerebral infarction affecting right dominant side: Secondary | ICD-10-CM | POA: Diagnosis not present

## 2015-03-03 DIAGNOSIS — I10 Essential (primary) hypertension: Secondary | ICD-10-CM | POA: Diagnosis not present

## 2015-03-03 DIAGNOSIS — I4891 Unspecified atrial fibrillation: Secondary | ICD-10-CM | POA: Diagnosis not present

## 2015-03-03 DIAGNOSIS — M150 Primary generalized (osteo)arthritis: Secondary | ICD-10-CM | POA: Diagnosis not present

## 2015-03-03 DIAGNOSIS — I69320 Aphasia following cerebral infarction: Secondary | ICD-10-CM | POA: Diagnosis not present

## 2015-03-03 DIAGNOSIS — M810 Age-related osteoporosis without current pathological fracture: Secondary | ICD-10-CM | POA: Diagnosis not present

## 2015-03-04 DIAGNOSIS — I69351 Hemiplegia and hemiparesis following cerebral infarction affecting right dominant side: Secondary | ICD-10-CM | POA: Diagnosis not present

## 2015-03-04 DIAGNOSIS — I69320 Aphasia following cerebral infarction: Secondary | ICD-10-CM | POA: Diagnosis not present

## 2015-03-04 DIAGNOSIS — M150 Primary generalized (osteo)arthritis: Secondary | ICD-10-CM | POA: Diagnosis not present

## 2015-03-04 DIAGNOSIS — I4891 Unspecified atrial fibrillation: Secondary | ICD-10-CM | POA: Diagnosis not present

## 2015-03-04 DIAGNOSIS — I10 Essential (primary) hypertension: Secondary | ICD-10-CM | POA: Diagnosis not present

## 2015-03-04 DIAGNOSIS — M810 Age-related osteoporosis without current pathological fracture: Secondary | ICD-10-CM | POA: Diagnosis not present

## 2015-03-07 DIAGNOSIS — I69351 Hemiplegia and hemiparesis following cerebral infarction affecting right dominant side: Secondary | ICD-10-CM | POA: Diagnosis not present

## 2015-03-07 DIAGNOSIS — M150 Primary generalized (osteo)arthritis: Secondary | ICD-10-CM | POA: Diagnosis not present

## 2015-03-07 DIAGNOSIS — I4891 Unspecified atrial fibrillation: Secondary | ICD-10-CM | POA: Diagnosis not present

## 2015-03-07 DIAGNOSIS — I10 Essential (primary) hypertension: Secondary | ICD-10-CM | POA: Diagnosis not present

## 2015-03-07 DIAGNOSIS — M810 Age-related osteoporosis without current pathological fracture: Secondary | ICD-10-CM | POA: Diagnosis not present

## 2015-03-07 DIAGNOSIS — I69320 Aphasia following cerebral infarction: Secondary | ICD-10-CM | POA: Diagnosis not present

## 2015-03-08 DIAGNOSIS — I4891 Unspecified atrial fibrillation: Secondary | ICD-10-CM | POA: Diagnosis not present

## 2015-03-08 DIAGNOSIS — I69320 Aphasia following cerebral infarction: Secondary | ICD-10-CM | POA: Diagnosis not present

## 2015-03-08 DIAGNOSIS — I69351 Hemiplegia and hemiparesis following cerebral infarction affecting right dominant side: Secondary | ICD-10-CM | POA: Diagnosis not present

## 2015-03-08 DIAGNOSIS — M810 Age-related osteoporosis without current pathological fracture: Secondary | ICD-10-CM | POA: Diagnosis not present

## 2015-03-08 DIAGNOSIS — M150 Primary generalized (osteo)arthritis: Secondary | ICD-10-CM | POA: Diagnosis not present

## 2015-03-08 DIAGNOSIS — I10 Essential (primary) hypertension: Secondary | ICD-10-CM | POA: Diagnosis not present

## 2015-03-09 DIAGNOSIS — M150 Primary generalized (osteo)arthritis: Secondary | ICD-10-CM | POA: Diagnosis not present

## 2015-03-09 DIAGNOSIS — I69351 Hemiplegia and hemiparesis following cerebral infarction affecting right dominant side: Secondary | ICD-10-CM | POA: Diagnosis not present

## 2015-03-09 DIAGNOSIS — M810 Age-related osteoporosis without current pathological fracture: Secondary | ICD-10-CM | POA: Diagnosis not present

## 2015-03-09 DIAGNOSIS — I10 Essential (primary) hypertension: Secondary | ICD-10-CM | POA: Diagnosis not present

## 2015-03-09 DIAGNOSIS — I4891 Unspecified atrial fibrillation: Secondary | ICD-10-CM | POA: Diagnosis not present

## 2015-03-09 DIAGNOSIS — I69320 Aphasia following cerebral infarction: Secondary | ICD-10-CM | POA: Diagnosis not present

## 2015-03-10 ENCOUNTER — Emergency Department (HOSPITAL_COMMUNITY)
Admission: EM | Admit: 2015-03-10 | Discharge: 2015-03-10 | Disposition: A | Discharge status: Home / Self Care | Payer: Medicare Other | Attending: Emergency Medicine | Admitting: Emergency Medicine

## 2015-03-10 ENCOUNTER — Encounter (HOSPITAL_COMMUNITY): Payer: Self-pay | Admitting: Emergency Medicine

## 2015-03-10 ENCOUNTER — Emergency Department (HOSPITAL_COMMUNITY): Payer: Medicare Other

## 2015-03-10 DIAGNOSIS — Z90710 Acquired absence of both cervix and uterus: Secondary | ICD-10-CM | POA: Diagnosis not present

## 2015-03-10 DIAGNOSIS — I69320 Aphasia following cerebral infarction: Secondary | ICD-10-CM | POA: Diagnosis not present

## 2015-03-10 DIAGNOSIS — G8929 Other chronic pain: Secondary | ICD-10-CM | POA: Insufficient documentation

## 2015-03-10 DIAGNOSIS — Z880 Allergy status to penicillin: Secondary | ICD-10-CM | POA: Diagnosis not present

## 2015-03-10 DIAGNOSIS — K5730 Diverticulosis of large intestine without perforation or abscess without bleeding: Secondary | ICD-10-CM | POA: Diagnosis not present

## 2015-03-10 DIAGNOSIS — M1990 Unspecified osteoarthritis, unspecified site: Secondary | ICD-10-CM | POA: Diagnosis not present

## 2015-03-10 DIAGNOSIS — K5900 Constipation, unspecified: Secondary | ICD-10-CM | POA: Diagnosis not present

## 2015-03-10 DIAGNOSIS — Z862 Personal history of diseases of the blood and blood-forming organs and certain disorders involving the immune mechanism: Secondary | ICD-10-CM | POA: Insufficient documentation

## 2015-03-10 DIAGNOSIS — Z9049 Acquired absence of other specified parts of digestive tract: Secondary | ICD-10-CM | POA: Diagnosis not present

## 2015-03-10 DIAGNOSIS — Z9889 Other specified postprocedural states: Secondary | ICD-10-CM | POA: Diagnosis not present

## 2015-03-10 DIAGNOSIS — M150 Primary generalized (osteo)arthritis: Secondary | ICD-10-CM | POA: Diagnosis not present

## 2015-03-10 DIAGNOSIS — R1084 Generalized abdominal pain: Secondary | ICD-10-CM | POA: Diagnosis not present

## 2015-03-10 DIAGNOSIS — G2581 Restless legs syndrome: Secondary | ICD-10-CM | POA: Diagnosis not present

## 2015-03-10 DIAGNOSIS — I69351 Hemiplegia and hemiparesis following cerebral infarction affecting right dominant side: Secondary | ICD-10-CM | POA: Diagnosis not present

## 2015-03-10 DIAGNOSIS — R109 Unspecified abdominal pain: Secondary | ICD-10-CM

## 2015-03-10 DIAGNOSIS — E039 Hypothyroidism, unspecified: Secondary | ICD-10-CM | POA: Insufficient documentation

## 2015-03-10 DIAGNOSIS — M810 Age-related osteoporosis without current pathological fracture: Secondary | ICD-10-CM | POA: Diagnosis not present

## 2015-03-10 DIAGNOSIS — Z79899 Other long term (current) drug therapy: Secondary | ICD-10-CM | POA: Insufficient documentation

## 2015-03-10 DIAGNOSIS — R1030 Lower abdominal pain, unspecified: Secondary | ICD-10-CM | POA: Diagnosis present

## 2015-03-10 DIAGNOSIS — N390 Urinary tract infection, site not specified: Secondary | ICD-10-CM | POA: Insufficient documentation

## 2015-03-10 DIAGNOSIS — I4891 Unspecified atrial fibrillation: Secondary | ICD-10-CM | POA: Diagnosis not present

## 2015-03-10 DIAGNOSIS — I10 Essential (primary) hypertension: Secondary | ICD-10-CM | POA: Diagnosis not present

## 2015-03-10 LAB — COMPREHENSIVE METABOLIC PANEL
ALBUMIN: 4 g/dL (ref 3.5–5.0)
ALK PHOS: 82 U/L (ref 38–126)
ALT: 21 U/L (ref 14–54)
ANION GAP: 7 (ref 5–15)
AST: 23 U/L (ref 15–41)
BUN: 23 mg/dL — ABNORMAL HIGH (ref 6–20)
CALCIUM: 9.9 mg/dL (ref 8.9–10.3)
CO2: 29 mmol/L (ref 22–32)
Chloride: 100 mmol/L — ABNORMAL LOW (ref 101–111)
Creatinine, Ser: 1.04 mg/dL — ABNORMAL HIGH (ref 0.44–1.00)
GFR, EST AFRICAN AMERICAN: 56 mL/min — AB (ref >60)
GFR, EST NON AFRICAN AMERICAN: 48 mL/min — AB (ref >60)
Glucose, Bld: 94 mg/dL (ref 65–99)
POTASSIUM: 4 mmol/L (ref 3.5–5.1)
SODIUM: 136 mmol/L (ref 135–145)
TOTAL PROTEIN: 6.9 g/dL (ref 6.5–8.1)
Total Bilirubin: 1.4 mg/dL — ABNORMAL HIGH (ref 0.3–1.2)

## 2015-03-10 LAB — CBC WITH DIFFERENTIAL/PLATELET
Basophils Absolute: 0 10*3/uL (ref 0.0–0.1)
Basophils Relative: 0 % (ref 0–1)
EOS ABS: 0.1 10*3/uL (ref 0.0–0.7)
EOS PCT: 1 % (ref 0–5)
HCT: 45 % (ref 36.0–46.0)
Hemoglobin: 14.7 g/dL (ref 12.0–15.0)
Lymphocytes Relative: 14 % (ref 12–46)
Lymphs Abs: 1.4 10*3/uL (ref 0.7–4.0)
MCH: 30.6 pg (ref 26.0–34.0)
MCHC: 32.7 g/dL (ref 30.0–36.0)
MCV: 93.6 fL (ref 78.0–100.0)
MONO ABS: 1.1 10*3/uL — AB (ref 0.1–1.0)
Monocytes Relative: 11 % (ref 3–12)
Neutro Abs: 7.1 10*3/uL (ref 1.7–7.7)
Neutrophils Relative %: 74 % (ref 43–77)
Platelets: 232 10*3/uL (ref 150–400)
RBC: 4.81 MIL/uL (ref 3.87–5.11)
RDW: 13.2 % (ref 11.5–15.5)
WBC: 9.7 10*3/uL (ref 4.0–10.5)

## 2015-03-10 LAB — URINALYSIS, ROUTINE W REFLEX MICROSCOPIC
Bilirubin Urine: NEGATIVE
Glucose, UA: NEGATIVE mg/dL
Ketones, ur: 15 mg/dL — AB
Nitrite: NEGATIVE
PH: 7.5 (ref 5.0–8.0)
PROTEIN: 100 mg/dL — AB
SPECIFIC GRAVITY, URINE: 1.021 (ref 1.005–1.030)
UROBILINOGEN UA: 1 mg/dL (ref 0.0–1.0)

## 2015-03-10 LAB — URINE MICROSCOPIC-ADD ON

## 2015-03-10 LAB — LIPASE, BLOOD: Lipase: 30 U/L (ref 22–51)

## 2015-03-10 LAB — I-STAT CG4 LACTIC ACID, ED: LACTIC ACID, VENOUS: 0.94 mmol/L (ref 0.5–2.0)

## 2015-03-10 MED ORDER — CEPHALEXIN 500 MG PO CAPS: 500.0000 mg | ORAL_CAPSULE | Freq: Three times a day (TID) | ORAL | Status: DC

## 2015-03-10 MED ORDER — IOHEXOL 300 MG/ML  SOLN
50.0000 mL | Freq: Once | INTRAMUSCULAR | Status: AC | PRN
  Administered 2015-03-10: 50 mL via ORAL

## 2015-03-10 MED ORDER — SODIUM CHLORIDE 0.9 % IV SOLN
INTRAVENOUS | Status: DC
  Administered 2015-03-10: 125 mL/h via INTRAVENOUS

## 2015-03-10 MED ORDER — IOHEXOL 300 MG/ML  SOLN
75.0000 mL | Freq: Once | INTRAMUSCULAR | Status: AC | PRN
  Administered 2015-03-10: 75 mL via INTRAVENOUS

## 2015-03-10 NOTE — ED Notes (Signed)
Bed: [facility] Expected date:  Expected time:  Means of arrival:  Comments: Ems- constipation

## 2015-03-10 NOTE — ED Notes (Signed)
Per EMS: Pt from home. Pt states the past 3 days has hd no BM, now c/o lower abd pain. Denies n/v. Had hysterectomy about 2 months ago

## 2015-03-10 NOTE — ED Provider Notes (Signed)
CSN: [id number]     Arrival date & time 03/10/15  1439 History   First MD Initiated Contact with Patient 03/10/15 1540     Chief Complaint  Patient presents with  . Constipation  . Abdominal Pain     (Consider location/radiation/quality/duration/timing/severity/associated sxs/prior Treatment) HPI Comments: Pt here with acute onset of mid lower abd pain just pta--h/o cva and speech is limited-- No reported emesis, fever, diarrhea-- No bowel movement x 3 days-- No reported uti sx-- Sx persistent and no treatment used pta  Patient is a 81 y.o. female presenting with constipation and abdominal pain. The history is provided by the patient and a caregiver. The history is limited by the condition of the patient.  Constipation Associated symptoms: abdominal pain   Abdominal Pain Associated symptoms: constipation     Past Medical History  Diagnosis Date  . History of cardiac catheterization 2002    Negative  . Vertebral fracture   . Restless legs syndrome with nocturnal myoclonus 05/13/2013     requip controlled   . Abnormal uterine bleeding 3976,7341  . Hypothyroidism   . Arthritis   . Osteoporosis   . History of stomach ulcers   . History of jaundice as a child   . Anemia   . Head pain, chronic     "I have pain in the back of my head for months"  . Dizziness     TAKES MECLIZINE TO TREAT  . Hemiparesis and speech and language deficit as late effects of stroke 02/09/2015   Past Surgical History  Procedure Laterality Date  . Back surgery      x 3  . Breast surgery Bilateral     1 lump each breast removed and benign  . Nasal sinus surgery    . Dilation and curettage of uterus  1970  . Catheterization  2003    cardiac cath  . Kyphoplasty  2010  . Cataract extraction Bilateral 2011  . Cholecystectomy  1995    Lap  . Cardiac catheterization  2002    "it was normal" per pt - no record found  . Robotic assisted salpingo oopherectomy Bilateral 11/02/2014    Procedure: ROBOTIC  ASSISTED bilateral SALPINGO OOPHORECTOMY;  Surgeon: Everitt Amber, MD;  Location: [facility];  Service: Gynecology;  Laterality: Bilateral;  . Laparoscopy N/A 11/02/2014    Procedure: LAPAROSCOPY DIAGNOSTIC;  Surgeon: Everitt Amber, MD;  Location: [facility];  Service: Gynecology;  Laterality: N/A;  . Abdominal hysterectomy     Family History  Problem Relation Age of Onset  . Asthma Brother   . Osteoarthritis Son   . Hypertension Father   . Stroke Father   . Cancer Brother     stomach   History  Substance Use Topics  . Smoking status: Never Smoker   . Smokeless tobacco: Never Used  . Alcohol Use: No   OB History    Gravida Para Term Preterm AB TAB SAB Ectopic Multiple Living   2 2       1 3      Review of Systems  Gastrointestinal: Positive for abdominal pain and constipation.  All other systems reviewed and are negative.     Allergies  Ibuprofen; Penicillins; Demerol; Dilaudid; Fentanyl; Lidoderm; Lyrica; Morphine and related; Other; Percocet; Tramadol; and Vicodin  Home Medications   Prior to Admission medications   Medication Sig Start Date End Date Taking? Authorizing Provider  acetaminophen (TYLENOL) 500 MG tablet Take 1,000 mg by mouth every 6 (six) hours as needed.  Usually only takes 2 x a  day   Yes Historical Provider, MD  apixaban (ELIQUIS) 5 MG TABS tablet Take 1 tablet (5 mg total) by mouth 2 (two) times daily. Please discontinue Aspirin once started on Eliquis! 02/15/15  Yes Kathrynn Ducking, MD  calcium gluconate 500 MG tablet Take 500 mg by mouth 2 (two) times daily. With 1000 units D3   Yes Historical Provider, MD  cholecalciferol (VITAMIN D) 1000 UNITS tablet Take 1,000 Units by mouth 2 (two) times daily. Take with calium   Yes Historical Provider, MD  Cranberry 200 MG CAPS Take 1 capsule by mouth daily with lunch.    Yes Historical Provider, MD  denosumab (PROLIA) 60 MG/ML SOLN injection Inject 60 mg into the skin every 6 (six) months. Administer in upper arm, thigh, or  abdomen   Yes Historical Provider, MD  docusate sodium (COLACE) 100 MG capsule Take 100 mg by mouth 2 (two) times daily as needed for mild constipation or moderate constipation.   Yes Historical Provider, MD  Flaxseed, Linseed, (FLAX SEED OIL) 1000 MG CAPS Take 1 capsule by mouth 2 (two) times daily.    Yes Historical Provider, MD  hyoscyamine (ANASPAZ) 0.125 MG TBDP disintergrating tablet Place 0.125 mg under the tongue 2 (two) times daily as needed for bladder spasms.   Yes Historical Provider, MD  levothyroxine (SYNTHROID, LEVOTHROID) 50 MCG tablet Take 50 mcg by mouth every morning.    Yes Historical Provider, MD  meclizine (ANTIVERT) 25 MG tablet Take 25 mg by mouth daily as needed for dizziness.  05/07/13  Yes Historical Provider, MD  metoprolol tartrate (LOPRESSOR) 25 MG tablet Take 25 mg by mouth 2 (two) times daily.   Yes Historical Provider, MD  Multiple Vitamins-Minerals (CENTRUM SILVER PO) Take 1 tablet by mouth daily with lunch.    Yes Historical Provider, MD  ondansetron (ZOFRAN) 4 MG tablet Take 1 tablet (4 mg total) by mouth every 8 (eight) hours as needed for nausea or vomiting. 11/02/14  Yes Lahoma Crocker, MD  pantoprazole sodium (PROTONIX) 40 mg/20 mL PACK Take 20 mLs (40 mg total) by mouth daily. Patient taking differently: Take 40 mg by mouth daily as needed (acid reflux, heartburn).  02/11/15  Yes Gildardo Cranker, DO  Propylene Glycol (SYSTANE BALANCE OP) Apply 1 drop to eye 2 (two) times daily.   Yes Historical Provider, MD  rOPINIRole (REQUIP) 0.25 MG tablet TAKE 1 TABLET BY MOUTH 3 TIMES A DAY 09/10/14  Yes Asencion Partridge Dohmeier, MD  sennosides-docusate sodium (SENOKOT-S) 8.6-50 MG tablet Take 2 tablets by mouth daily.   Yes Historical Provider, MD  sertraline (ZOLOFT) 50 MG tablet Take 50 mg by mouth daily.   Yes Historical Provider, MD  traMADol (ULTRAM) 50 MG tablet Take 50 mg by mouth every 6 (six) hours as needed for severe pain.   Yes Historical Provider, MD  metoprolol succinate  (TOPROL-XL) 25 MG 24 hr tablet Take 1 tablet by mouth daily. Pt has, but states she is still taking the metoprolol tartrate bid. 02/24/15   Historical Provider, MD   BP 119/70 mmHg  Pulse 82  Temp(Src) 97.7 F (36.5 C) (Oral)  Resp 16  SpO2 98%  LMP 09/03/1996 (Approximate) Physical Exam  Constitutional: She is oriented to person, place, and time. She appears well-developed and well-nourished.  Non-toxic appearance. No distress.  HENT:  Head: Normocephalic and atraumatic.  Eyes: Conjunctivae, EOM and lids are normal. Pupils are equal, round, and reactive to light.  Neck: Normal range  of motion. Neck supple. No tracheal deviation present. No thyroid mass present.  Cardiovascular: Normal rate, regular rhythm and normal heart sounds.  Exam reveals no gallop.   No murmur heard. Pulmonary/Chest: Effort normal and breath sounds normal. No stridor. No respiratory distress. She has no decreased breath sounds. She has no wheezes. She has no rhonchi. She has no rales.  Abdominal: Soft. Normal appearance and bowel sounds are normal. She exhibits no distension. There is no tenderness. There is no rebound and no CVA tenderness.    Musculoskeletal: Normal range of motion. She exhibits no edema or tenderness.  Neurological: She is alert and oriented to person, place, and time. No cranial nerve deficit. GCS eye subscore is 4. GCS verbal subscore is 5. GCS motor subscore is 6.  At baseline according to son  Skin: Skin is warm and dry. No abrasion and no rash noted.  Psychiatric: Her affect is blunt.  Nursing note and vitals reviewed.   ED Course  Procedures (including critical care time) Labs Review Labs Reviewed  CBC WITH DIFFERENTIAL/PLATELET  COMPREHENSIVE METABOLIC PANEL  LIPASE, BLOOD  URINALYSIS, ROUTINE W REFLEX MICROSCOPIC (NOT AT [facility])  I-STAT CG4 LACTIC ACID, ED    Imaging Review No results found.   EKG Interpretation None      MDM   Final diagnoses:  Abdominal pain     Patient to be treated for her UTI.    Lacretia Leigh, MD 03/10/15 [phone]

## 2015-03-10 NOTE — Discharge Instructions (Signed)

## 2015-03-13 ENCOUNTER — Inpatient Hospital Stay (HOSPITAL_COMMUNITY)
Admission: EM | Admit: 2015-03-13 | Discharge: 2015-03-17 | DRG: 689 | Disposition: A | Discharge status: Rehabilitation Facility | Payer: Medicare Other | Attending: Internal Medicine | Admitting: Internal Medicine

## 2015-03-13 ENCOUNTER — Emergency Department (HOSPITAL_COMMUNITY): Payer: Medicare Other

## 2015-03-13 ENCOUNTER — Encounter (HOSPITAL_COMMUNITY): Payer: Self-pay | Admitting: Emergency Medicine

## 2015-03-13 DIAGNOSIS — I69351 Hemiplegia and hemiparesis following cerebral infarction affecting right dominant side: Secondary | ICD-10-CM | POA: Diagnosis not present

## 2015-03-13 DIAGNOSIS — I480 Paroxysmal atrial fibrillation: Secondary | ICD-10-CM | POA: Diagnosis present

## 2015-03-13 DIAGNOSIS — I638 Other cerebral infarction: Secondary | ICD-10-CM | POA: Diagnosis not present

## 2015-03-13 DIAGNOSIS — M549 Dorsalgia, unspecified: Secondary | ICD-10-CM

## 2015-03-13 DIAGNOSIS — I10 Essential (primary) hypertension: Secondary | ICD-10-CM | POA: Diagnosis present

## 2015-03-13 DIAGNOSIS — E039 Hypothyroidism, unspecified: Secondary | ICD-10-CM | POA: Diagnosis present

## 2015-03-13 DIAGNOSIS — R079 Chest pain, unspecified: Secondary | ICD-10-CM

## 2015-03-13 DIAGNOSIS — G9340 Encephalopathy, unspecified: Secondary | ICD-10-CM | POA: Diagnosis present

## 2015-03-13 DIAGNOSIS — Z7401 Bed confinement status: Secondary | ICD-10-CM

## 2015-03-13 DIAGNOSIS — IMO0002 Reserved for concepts with insufficient information to code with codable children: Secondary | ICD-10-CM

## 2015-03-13 DIAGNOSIS — G2581 Restless legs syndrome: Secondary | ICD-10-CM | POA: Diagnosis present

## 2015-03-13 DIAGNOSIS — M419 Scoliosis, unspecified: Secondary | ICD-10-CM | POA: Diagnosis not present

## 2015-03-13 DIAGNOSIS — G9389 Other specified disorders of brain: Secondary | ICD-10-CM | POA: Diagnosis not present

## 2015-03-13 DIAGNOSIS — Z66 Do not resuscitate: Secondary | ICD-10-CM | POA: Diagnosis present

## 2015-03-13 DIAGNOSIS — R4182 Altered mental status, unspecified: Secondary | ICD-10-CM | POA: Diagnosis not present

## 2015-03-13 DIAGNOSIS — M810 Age-related osteoporosis without current pathological fracture: Secondary | ICD-10-CM | POA: Diagnosis present

## 2015-03-13 DIAGNOSIS — K5900 Constipation, unspecified: Secondary | ICD-10-CM

## 2015-03-13 DIAGNOSIS — N950 Postmenopausal bleeding: Secondary | ICD-10-CM

## 2015-03-13 DIAGNOSIS — I69320 Aphasia following cerebral infarction: Secondary | ICD-10-CM

## 2015-03-13 DIAGNOSIS — R4701 Aphasia: Secondary | ICD-10-CM | POA: Diagnosis present

## 2015-03-13 DIAGNOSIS — N390 Urinary tract infection, site not specified: Principal | ICD-10-CM | POA: Diagnosis present

## 2015-03-13 DIAGNOSIS — Z7901 Long term (current) use of anticoagulants: Secondary | ICD-10-CM

## 2015-03-13 DIAGNOSIS — M545 Low back pain: Secondary | ICD-10-CM | POA: Diagnosis present

## 2015-03-13 DIAGNOSIS — H109 Unspecified conjunctivitis: Secondary | ICD-10-CM | POA: Diagnosis present

## 2015-03-13 LAB — CBC
HCT: 44.3 % (ref 36.0–46.0)
HEMOGLOBIN: 14.5 g/dL (ref 12.0–15.0)
MCH: 30.9 pg (ref 26.0–34.0)
MCHC: 32.7 g/dL (ref 30.0–36.0)
MCV: 94.5 fL (ref 78.0–100.0)
PLATELETS: 291 10*3/uL (ref 150–400)
RBC: 4.69 MIL/uL (ref 3.87–5.11)
RDW: 13.2 % (ref 11.5–15.5)
WBC: 11.8 10*3/uL — AB (ref 4.0–10.5)

## 2015-03-13 LAB — BASIC METABOLIC PANEL
Anion gap: 8 (ref 5–15)
BUN: 21 mg/dL — ABNORMAL HIGH (ref 6–20)
CALCIUM: 9.2 mg/dL (ref 8.9–10.3)
CHLORIDE: 99 mmol/L — AB (ref 101–111)
CO2: 27 mmol/L (ref 22–32)
CREATININE: 1.16 mg/dL — AB (ref 0.44–1.00)
GFR calc Af Amer: 49 mL/min — ABNORMAL LOW (ref >60)
GFR, EST NON AFRICAN AMERICAN: 42 mL/min — AB (ref >60)
Glucose, Bld: 137 mg/dL — ABNORMAL HIGH (ref 65–99)
Potassium: 3.9 mmol/L (ref 3.5–5.1)
Sodium: 134 mmol/L — ABNORMAL LOW (ref 135–145)

## 2015-03-13 LAB — I-STAT TROPONIN, ED: Troponin i, poc: 0.01 ng/mL (ref 0.00–0.08)

## 2015-03-13 LAB — I-STAT CG4 LACTIC ACID, ED: LACTIC ACID, VENOUS: 1.9 mmol/L (ref 0.5–2.0)

## 2015-03-13 MED ORDER — SODIUM CHLORIDE 0.9 % IV BOLUS (SEPSIS)
1000.0000 mL | Freq: Once | INTRAVENOUS | Status: AC
Start: 2015-03-13 — End: 2015-03-14
  Administered 2015-03-14: 1000 mL via INTRAVENOUS

## 2015-03-13 NOTE — ED Notes (Signed)
Patient in xray at this time.

## 2015-03-13 NOTE — ED Provider Notes (Signed)
CSN: [id number]     Arrival date & time 03/13/15  2201 History   This chart was scribed for [name], MD by Forrestine Him, ED Scribe. This patient was seen in room B17C/B17C and the patient's care was started 11:17 PM.   Chief Complaint  Patient presents with  . Chest Pain   The history is provided by a relative. No language interpreter was used.     LEVEL 5 CAVEAT- PT IS NONVERBAL  HPI Comments: Linda [name], here with her son is a 81 y.o. female with a PMHx of CVA who presents to the Emergency Department complaining of constant, ongoing, unchanged L sided and central chest pain onset 9:00 PM this evening. No aggravating or alleviating factors at this time. Family also reports ongoing fatigue and constipation. No recent fever, chills, nausea, vomiting, diarrhea, or  abdominal pain. Linda [name] was evaluated at [facility] on 7/7 for constipation and abdominal pain. She was diagnosed with a bladder infection at the time and started on Keflex at discharge which she has not yet completed. No recent fever or chills. PSHx includes cardiac catheterization performed in 2002/2003, Cholecystectomy-1995, and abdominal hysterectory performed several years ago. Pt is followed by Dr. Tilley-Cardiology.  Past Medical History  Diagnosis Date  . History of cardiac catheterization 2002    Negative  . Vertebral fracture   . Restless legs syndrome with nocturnal myoclonus 05/13/2013     requip controlled   . Abnormal uterine bleeding [id number]  . Hypothyroidism   . Arthritis   . Osteoporosis   . History of stomach ulcers   . History of jaundice as a child   . Anemia   . Head pain, chronic     "I have pain in the back of my head for months"  . Dizziness     TAKES MECLIZINE TO TREAT  . Hemiparesis and speech and language deficit as late effects of stroke 02/09/2015   Past Surgical History  Procedure Laterality Date  . Back surgery      x 3  . Breast surgery Bilateral     1 lump each breast  removed and benign  . Nasal sinus surgery    . Dilation and curettage of uterus  1970  . Catheterization  2003    cardiac cath  . Kyphoplasty  2010  . Cataract extraction Bilateral 2011  . Cholecystectomy  1995    Lap  . Cardiac catheterization  2002    "it was normal" per pt - no record found  . Robotic assisted salpingo oopherectomy Bilateral 11/02/2014    Procedure: ROBOTIC ASSISTED bilateral SALPINGO OOPHORECTOMY;  Surgeon: Everitt Amber, MD;  Location: [facility];  Service: Gynecology;  Laterality: Bilateral;  . Laparoscopy N/A 11/02/2014    Procedure: LAPAROSCOPY DIAGNOSTIC;  Surgeon: Everitt Amber, MD;  Location: [facility];  Service: Gynecology;  Laterality: N/A;  . Abdominal hysterectomy     Family History  Problem Relation Age of Onset  . Asthma Brother   . Osteoarthritis Son   . Hypertension Father   . Stroke Father   . Cancer Brother     stomach   History  Substance Use Topics  . Smoking status: Never Smoker   . Smokeless tobacco: Never Used  . Alcohol Use: No   OB History    Gravida Para Term Preterm AB TAB SAB Ectopic Multiple Living   2 2       1 3      Review of Systems  Unable to  perform ROS: Patient nonverbal      Allergies  Ibuprofen; Penicillins; Demerol; Dilaudid; Fentanyl; Lidoderm; Lyrica; Morphine and related; Other; Percocet; Tramadol; and Vicodin  Home Medications   Prior to Admission medications   Medication Sig Start Date End Date Taking? Authorizing Provider  acetaminophen (TYLENOL) 500 MG tablet Take 1,000 mg by mouth every 6 (six) hours as needed. Usually only takes 2 x a  day    Historical Provider, MD  apixaban (ELIQUIS) 5 MG TABS tablet Take 1 tablet (5 mg total) by mouth 2 (two) times daily. Please discontinue Aspirin once started on Eliquis! 02/15/15   Kathrynn Ducking, MD  calcium gluconate 500 MG tablet Take 500 mg by mouth 2 (two) times daily. With 1000 units D3    Historical Provider, MD  cephALEXin (KEFLEX) 500 MG capsule Take 1 capsule (500  mg total) by mouth 3 (three) times daily. 03/10/15   Lacretia Leigh, MD  cholecalciferol (VITAMIN D) 1000 UNITS tablet Take 1,000 Units by mouth 2 (two) times daily. Take with calium    Historical Provider, MD  Cranberry 200 MG CAPS Take 1 capsule by mouth daily with lunch.     Historical Provider, MD  denosumab (PROLIA) 60 MG/ML SOLN injection Inject 60 mg into the skin every 6 (six) months. Administer in upper arm, thigh, or abdomen    Historical Provider, MD  docusate sodium (COLACE) 100 MG capsule Take 100 mg by mouth 2 (two) times daily as needed for mild constipation or moderate constipation.    Historical Provider, MD  Flaxseed, Linseed, (FLAX SEED OIL) 1000 MG CAPS Take 1 capsule by mouth 2 (two) times daily.     Historical Provider, MD  hyoscyamine (ANASPAZ) 0.125 MG TBDP disintergrating tablet Place 0.125 mg under the tongue 2 (two) times daily as needed for bladder spasms.    Historical Provider, MD  levothyroxine (SYNTHROID, LEVOTHROID) 50 MCG tablet Take 50 mcg by mouth every morning.     Historical Provider, MD  meclizine (ANTIVERT) 25 MG tablet Take 25 mg by mouth daily as needed for dizziness.  05/07/13   Historical Provider, MD  metoprolol succinate (TOPROL-XL) 25 MG 24 hr tablet Take 1 tablet by mouth daily. Pt has, but states she is still taking the metoprolol tartrate bid. 02/24/15   Historical Provider, MD  metoprolol tartrate (LOPRESSOR) 25 MG tablet Take 25 mg by mouth 2 (two) times daily.    Historical Provider, MD  Multiple Vitamins-Minerals (CENTRUM SILVER PO) Take 1 tablet by mouth daily with lunch.     Historical Provider, MD  ondansetron (ZOFRAN) 4 MG tablet Take 1 tablet (4 mg total) by mouth every 8 (eight) hours as needed for nausea or vomiting. 11/02/14   Lahoma Crocker, MD  pantoprazole sodium (PROTONIX) 40 mg/20 mL PACK Take 20 mLs (40 mg total) by mouth daily. Patient taking differently: Take 40 mg by mouth daily as needed (acid reflux, heartburn).  02/11/15   Gildardo Cranker, DO  Propylene Glycol (SYSTANE BALANCE OP) Apply 1 drop to eye 2 (two) times daily.    Historical Provider, MD  rOPINIRole (REQUIP) 0.25 MG tablet TAKE 1 TABLET BY MOUTH 3 TIMES A DAY 09/10/14   Asencion Partridge Dohmeier, MD  sennosides-docusate sodium (SENOKOT-S) 8.6-50 MG tablet Take 2 tablets by mouth daily.    Historical Provider, MD  sertraline (ZOLOFT) 50 MG tablet Take 50 mg by mouth daily.    Historical Provider, MD  traMADol (ULTRAM) 50 MG tablet Take 50 mg by mouth every 6 (  six) hours as needed for severe pain.    Historical Provider, MD   Triage Vitals: BP 100/66 mmHg  Pulse 75  Temp(Src) 97.6 F (36.4 C) (Oral)  Resp 14  SpO2 98%  LMP 09/03/1996 (Approximate)   Physical Exam  Constitutional: She is oriented to person, place, and time. She appears well-developed and well-nourished. No distress.  HENT:  Head: Normocephalic and atraumatic.  Nose: Nose normal.  Mouth/Throat: No oropharyngeal exudate.  Dry oropharynx  Eyes: Conjunctivae and EOM are normal. Pupils are equal, round, and reactive to light. Right eye exhibits discharge. Left eye exhibits discharge. No scleral icterus.  Mild discharge from both eyes  Neck: Normal range of motion. Neck supple. No JVD present. No tracheal deviation present. No thyromegaly present.  Cardiovascular: Normal rate, regular rhythm and normal heart sounds.  Exam reveals no gallop and no friction rub.   No murmur heard. Pulmonary/Chest: Effort normal and breath sounds normal. No respiratory distress. She has no wheezes. She exhibits no tenderness.  Abdominal: Soft. Bowel sounds are normal. She exhibits no distension and no mass. There is tenderness. There is no rebound and no guarding.  Mid abdominal tenderness  Musculoskeletal: Normal range of motion. She exhibits no edema or tenderness.  Lymphadenopathy:    She has no cervical adenopathy.  Neurological: She is alert and oriented to person, place, and time. No cranial nerve deficit. She exhibits  normal muscle tone.  Skin: Skin is warm and dry. No rash noted. No erythema. No pallor.  Nursing note and vitals reviewed.   ED Course  Procedures (including critical care time)  DIAGNOSTIC STUDIES: Oxygen Saturation is 98% on RA, Normal by my interpretation.    COORDINATION OF CARE: 11:20 PM- Will order EKG, CXR, BMP, i-stat troponin, CT head without contast, i-stat CG4 lactic acid, APPT, PT-INR, TSH, T4, urinalysis, and CBC. Will give fluids. Discussed treatment plan with pt at bedside and pt agreed to plan.     Labs Review Labs Reviewed  CBC - Abnormal; Notable for the following:    WBC 11.8 (*)    All other components within normal limits  BASIC METABOLIC PANEL - Abnormal; Notable for the following:    Sodium 134 (*)    Chloride 99 (*)    Glucose, Bld 137 (*)    BUN 21 (*)    Creatinine, Ser 1.16 (*)    GFR calc non Af Amer 42 (*)    GFR calc Af Amer 49 (*)    All other components within normal limits  URINALYSIS, ROUTINE W REFLEX MICROSCOPIC (NOT AT [facility]) - Abnormal; Notable for the following:    Color, Urine AMBER (*)    Ketones, ur 15 (*)    Protein, ur 100 (*)    All other components within normal limits  PROTIME-INR - Abnormal; Notable for the following:    Prothrombin Time 22.1 (*)    INR 1.95 (*)    All other components within normal limits  TSH - Abnormal; Notable for the following:    TSH 5.296 (*)    All other components within normal limits  T4, FREE - Abnormal; Notable for the following:    Free T4 1.55 (*)    All other components within normal limits  URINE MICROSCOPIC-ADD ON - Abnormal; Notable for the following:    Crystals CA OXALATE CRYSTALS (*)    All other components within normal limits  URINE CULTURE  APTT  I-STAT TROPOININ, ED  I-STAT CG4 LACTIC ACID, ED  Imaging Review Dg Chest 2 View  03/14/2015   CLINICAL DATA:  Mid chest pain and dizziness.  EXAM: CHEST  2 VIEW  COMPARISON:  02/25/2015  FINDINGS: Hyperinflation consistent with  emphysema. Normal heart size and pulmonary vascularity. Calcified granulomas in the right lung. Opacification of the left lung base posteriorly probably represents small pleural effusion. No focal consolidation in the lungs. No pneumothorax. Calcified and tortuous aorta. Scoliosis and degenerative changes in the thoracic spine with post kyphoplasty changes in the mid thoracic region.  IMPRESSION: Emphysematous changes.  Probable small left pleural effusion.   Electronically Signed   By: Lucienne Capers M.D.   On: 03/14/2015 00:13   Ct Head Wo Contrast  03/14/2015   CLINICAL DATA:  81 year old female with chest pain. Patient has history of prior CVA  EXAM: CT HEAD WITHOUT CONTRAST  TECHNIQUE: Contiguous axial images were obtained from the base of the skull through the vertex without intravenous contrast.  COMPARISON:  Head CT dated 02/18/2015 and MRI dated 02/26/2015  FINDINGS: The ventricles are dilated and the sulci are prominent compatible with age-related atrophy. Periventricular and deep white matter hypodensities represent chronic microvascular ischemic changes. Left frontoparietal as well as left occipital hypodensities correspond to the infarcts seen on the prior study. There is no intracranial hemorrhage. No mass effect or midline shift identified.  The visualized paranasal sinuses and mastoid air cells are well aerated. The calvarium is intact.  The rectum  IMPRESSION: No acute intracranial pathology.  Age-related atrophy and chronic microvascular ischemic disease. Progression of the left frontoparietal and left occipital infarcts with encephalomalacia changes in.  If symptoms persist and there are no contraindications, MRI may provide better evaluation if clinically indicated.   Electronically Signed   By: Anner Crete M.D.   On: 03/14/2015 00:29     EKG Interpretation   Date/Time:  Sunday March 13 2015 22:03:42 EDT Ventricular Rate:  92 PR Interval:    QRS Duration: 78 QT Interval:   366 QTC Calculation: 452 R Axis:   8 Text Interpretation:  Atrial fibrillation Low voltage QRS Cannot rule out  Anterior infarct , age undetermined Abnormal ECG No significant change  since last tracing Confirmed by Glynn Octave [phone]) on 03/13/2015  11:13:10 PM      MDM   Final diagnoses:  Chest pain   Patient presents emergency department for altered mental status and worsening fatigue. This going on for several days despite antibody coverage for urinary tract infection. Chest complains of abdominal pain and abnormal CT scan 4 days ago, do not believe repeat imaging is needed. Will obtain infectious workup, CT scan of head. Patient is afebrile but will obtain a rectal temperature. I anticipate admission for further diagnostics.  Rectal temperature is 98.6. CT scan of the head does not show any acute abnormalities. I'm unsure what the causes of the patient's altered mental status. Patient be admitted to [facility] for continued management.  I personally performed the services described in this documentation, which was scribed in my presence. The recorded information has been reviewed and is accurate.   [name], MD 03/14/15 [phone]

## 2015-03-13 NOTE — ED Notes (Addendum)
C/o pain across chest x 30 min per family.  Pt is non-verbal from previous CVA per family.  Seen at [facility] on 7/7 and diagnosed with UTI.  Family reports pt wants to lay in bed and doesn't want to get up since seen at [facility].

## 2015-03-14 ENCOUNTER — Inpatient Hospital Stay (HOSPITAL_COMMUNITY): Payer: Medicare Other

## 2015-03-14 ENCOUNTER — Encounter (HOSPITAL_COMMUNITY): Payer: Self-pay | Admitting: Internal Medicine

## 2015-03-14 ENCOUNTER — Emergency Department (HOSPITAL_COMMUNITY): Payer: Medicare Other

## 2015-03-14 DIAGNOSIS — M546 Pain in thoracic spine: Secondary | ICD-10-CM | POA: Diagnosis not present

## 2015-03-14 DIAGNOSIS — G9389 Other specified disorders of brain: Secondary | ICD-10-CM | POA: Diagnosis not present

## 2015-03-14 DIAGNOSIS — I480 Paroxysmal atrial fibrillation: Secondary | ICD-10-CM | POA: Diagnosis not present

## 2015-03-14 DIAGNOSIS — E860 Dehydration: Secondary | ICD-10-CM | POA: Diagnosis not present

## 2015-03-14 DIAGNOSIS — K5900 Constipation, unspecified: Secondary | ICD-10-CM

## 2015-03-14 DIAGNOSIS — R4701 Aphasia: Secondary | ICD-10-CM | POA: Diagnosis present

## 2015-03-14 DIAGNOSIS — M47816 Spondylosis without myelopathy or radiculopathy, lumbar region: Secondary | ICD-10-CM | POA: Diagnosis not present

## 2015-03-14 DIAGNOSIS — I63432 Cerebral infarction due to embolism of left posterior cerebral artery: Secondary | ICD-10-CM | POA: Diagnosis not present

## 2015-03-14 DIAGNOSIS — G9340 Encephalopathy, unspecified: Secondary | ICD-10-CM | POA: Diagnosis not present

## 2015-03-14 DIAGNOSIS — I638 Other cerebral infarction: Secondary | ICD-10-CM | POA: Diagnosis not present

## 2015-03-14 DIAGNOSIS — R482 Apraxia: Secondary | ICD-10-CM | POA: Diagnosis not present

## 2015-03-14 DIAGNOSIS — M810 Age-related osteoporosis without current pathological fracture: Secondary | ICD-10-CM | POA: Diagnosis present

## 2015-03-14 DIAGNOSIS — I69320 Aphasia following cerebral infarction: Secondary | ICD-10-CM | POA: Diagnosis not present

## 2015-03-14 DIAGNOSIS — I69920 Aphasia following unspecified cerebrovascular disease: Secondary | ICD-10-CM | POA: Diagnosis not present

## 2015-03-14 DIAGNOSIS — I6930 Unspecified sequelae of cerebral infarction: Secondary | ICD-10-CM | POA: Diagnosis not present

## 2015-03-14 DIAGNOSIS — I69351 Hemiplegia and hemiparesis following cerebral infarction affecting right dominant side: Secondary | ICD-10-CM | POA: Diagnosis not present

## 2015-03-14 DIAGNOSIS — E039 Hypothyroidism, unspecified: Secondary | ICD-10-CM | POA: Diagnosis present

## 2015-03-14 DIAGNOSIS — Z66 Do not resuscitate: Secondary | ICD-10-CM | POA: Diagnosis present

## 2015-03-14 DIAGNOSIS — I482 Chronic atrial fibrillation: Secondary | ICD-10-CM | POA: Diagnosis not present

## 2015-03-14 DIAGNOSIS — I4891 Unspecified atrial fibrillation: Secondary | ICD-10-CM | POA: Diagnosis not present

## 2015-03-14 DIAGNOSIS — H109 Unspecified conjunctivitis: Secondary | ICD-10-CM | POA: Diagnosis present

## 2015-03-14 DIAGNOSIS — I69359 Hemiplegia and hemiparesis following cerebral infarction affecting unspecified side: Secondary | ICD-10-CM | POA: Diagnosis not present

## 2015-03-14 DIAGNOSIS — N179 Acute kidney failure, unspecified: Secondary | ICD-10-CM | POA: Diagnosis not present

## 2015-03-14 DIAGNOSIS — G2581 Restless legs syndrome: Secondary | ICD-10-CM | POA: Diagnosis present

## 2015-03-14 DIAGNOSIS — Z7901 Long term (current) use of anticoagulants: Secondary | ICD-10-CM | POA: Diagnosis not present

## 2015-03-14 DIAGNOSIS — I63512 Cerebral infarction due to unspecified occlusion or stenosis of left middle cerebral artery: Secondary | ICD-10-CM | POA: Diagnosis not present

## 2015-03-14 DIAGNOSIS — R079 Chest pain, unspecified: Secondary | ICD-10-CM | POA: Diagnosis not present

## 2015-03-14 DIAGNOSIS — M545 Low back pain: Secondary | ICD-10-CM | POA: Diagnosis present

## 2015-03-14 DIAGNOSIS — E871 Hypo-osmolality and hyponatremia: Secondary | ICD-10-CM | POA: Diagnosis not present

## 2015-03-14 DIAGNOSIS — R269 Unspecified abnormalities of gait and mobility: Secondary | ICD-10-CM | POA: Diagnosis not present

## 2015-03-14 DIAGNOSIS — I10 Essential (primary) hypertension: Secondary | ICD-10-CM | POA: Diagnosis not present

## 2015-03-14 DIAGNOSIS — I69398 Other sequelae of cerebral infarction: Secondary | ICD-10-CM | POA: Diagnosis not present

## 2015-03-14 DIAGNOSIS — N390 Urinary tract infection, site not specified: Secondary | ICD-10-CM | POA: Diagnosis present

## 2015-03-14 DIAGNOSIS — M419 Scoliosis, unspecified: Secondary | ICD-10-CM | POA: Diagnosis not present

## 2015-03-14 DIAGNOSIS — M4184 Other forms of scoliosis, thoracic region: Secondary | ICD-10-CM | POA: Diagnosis not present

## 2015-03-14 DIAGNOSIS — Z7401 Bed confinement status: Secondary | ICD-10-CM | POA: Diagnosis not present

## 2015-03-14 LAB — CBC
HCT: 36.4 % (ref 36.0–46.0)
Hemoglobin: 12.3 g/dL (ref 12.0–15.0)
MCH: 31.1 pg (ref 26.0–34.0)
MCHC: 33.8 g/dL (ref 30.0–36.0)
MCV: 92.2 fL (ref 78.0–100.0)
Platelets: 225 10*3/uL (ref 150–400)
RBC: 3.95 MIL/uL (ref 3.87–5.11)
RDW: 13.1 % (ref 11.5–15.5)
WBC: 9.8 10*3/uL (ref 4.0–10.5)

## 2015-03-14 LAB — URINALYSIS, ROUTINE W REFLEX MICROSCOPIC
BILIRUBIN URINE: NEGATIVE
GLUCOSE, UA: NEGATIVE mg/dL
Hgb urine dipstick: NEGATIVE
Ketones, ur: 15 mg/dL — AB
LEUKOCYTES UA: NEGATIVE
NITRITE: NEGATIVE
Protein, ur: 100 mg/dL — AB
SPECIFIC GRAVITY, URINE: 1.02 (ref 1.005–1.030)
Urobilinogen, UA: 1 mg/dL (ref 0.0–1.0)
pH: 6 (ref 5.0–8.0)

## 2015-03-14 LAB — APTT: APTT: 32 s (ref 24–37)

## 2015-03-14 LAB — URINE MICROSCOPIC-ADD ON

## 2015-03-14 LAB — COMPREHENSIVE METABOLIC PANEL
ALBUMIN: 2.4 g/dL — AB (ref 3.5–5.0)
ALT: 17 U/L (ref 14–54)
ANION GAP: 6 (ref 5–15)
AST: 19 U/L (ref 15–41)
Alkaline Phosphatase: 76 U/L (ref 38–126)
BILIRUBIN TOTAL: 0.7 mg/dL (ref 0.3–1.2)
BUN: 21 mg/dL — ABNORMAL HIGH (ref 6–20)
CALCIUM: 8 mg/dL — AB (ref 8.9–10.3)
CO2: 25 mmol/L (ref 22–32)
Chloride: 104 mmol/L (ref 101–111)
Creatinine, Ser: 0.83 mg/dL (ref 0.44–1.00)
GFR calc Af Amer: >60 mL/min (ref >60)
GFR calc non Af Amer: >60 mL/min (ref >60)
GLUCOSE: 112 mg/dL — AB (ref 65–99)
POTASSIUM: 3.7 mmol/L (ref 3.5–5.1)
SODIUM: 135 mmol/L (ref 135–145)
Total Protein: 5.1 g/dL — ABNORMAL LOW (ref 6.5–8.1)

## 2015-03-14 LAB — TSH: TSH: 5.296 u[IU]/mL — ABNORMAL HIGH (ref 0.350–4.500)

## 2015-03-14 LAB — PHOSPHORUS: PHOSPHORUS: 2 mg/dL — AB (ref 2.5–4.6)

## 2015-03-14 LAB — T4, FREE: FREE T4: 1.55 ng/dL — AB (ref 0.61–1.12)

## 2015-03-14 LAB — TROPONIN I: Troponin I: <0.03 ng/mL (ref <0.031)

## 2015-03-14 LAB — MAGNESIUM: MAGNESIUM: 1.9 mg/dL (ref 1.7–2.4)

## 2015-03-14 LAB — PROTIME-INR
INR: 1.95 — ABNORMAL HIGH (ref 0.00–1.49)
Prothrombin Time: 22.1 seconds — ABNORMAL HIGH (ref 11.6–15.2)

## 2015-03-14 MED ORDER — SODIUM CHLORIDE 0.9 % IV SOLN
INTRAVENOUS | Status: AC
  Administered 2015-03-14: 07:00:00 via INTRAVENOUS

## 2015-03-14 MED ORDER — METHOCARBAMOL 1000 MG/10ML IJ SOLN
500.0000 mg | Freq: Three times a day (TID) | INTRAVENOUS | Status: DC | PRN
  Filled 2015-03-14: qty 5

## 2015-03-14 MED ORDER — ACETAMINOPHEN 650 MG RE SUPP: 650.0000 mg | Freq: Four times a day (QID) | RECTAL | Status: DC | PRN

## 2015-03-14 MED ORDER — LEVOTHYROXINE SODIUM 50 MCG PO TABS
50.0000 ug | ORAL_TABLET | Freq: Every day | ORAL | Status: DC
  Administered 2015-03-14 – 2015-03-17 (×4): 50 ug via ORAL
  Filled 2015-03-14 (×5): qty 1

## 2015-03-14 MED ORDER — TRAMADOL HCL 50 MG PO TABS
50.0000 mg | ORAL_TABLET | Freq: Four times a day (QID) | ORAL | Status: DC | PRN
  Administered 2015-03-16 – 2015-03-17 (×3): 50 mg via ORAL
  Filled 2015-03-14 (×3): qty 1

## 2015-03-14 MED ORDER — APIXABAN 5 MG PO TABS
5.0000 mg | ORAL_TABLET | Freq: Two times a day (BID) | ORAL | Status: DC
  Administered 2015-03-14: 5 mg via ORAL
  Filled 2015-03-14 (×2): qty 1

## 2015-03-14 MED ORDER — ACETAMINOPHEN 325 MG PO TABS
650.0000 mg | ORAL_TABLET | Freq: Four times a day (QID) | ORAL | Status: DC | PRN
  Administered 2015-03-14 – 2015-03-15 (×2): 650 mg via ORAL
  Filled 2015-03-14 (×3): qty 2

## 2015-03-14 MED ORDER — FENTANYL CITRATE (PF) 100 MCG/2ML IJ SOLN: 25.0000 ug | INTRAMUSCULAR | Status: DC | PRN

## 2015-03-14 MED ORDER — METOPROLOL TARTRATE 25 MG PO TABS
25.0000 mg | ORAL_TABLET | Freq: Two times a day (BID) | ORAL | Status: DC
  Administered 2015-03-14 – 2015-03-17 (×7): 25 mg via ORAL
  Filled 2015-03-14 (×7): qty 1

## 2015-03-14 MED ORDER — SENNOSIDES-DOCUSATE SODIUM 8.6-50 MG PO TABS
2.0000 | ORAL_TABLET | Freq: Every day | ORAL | Status: DC
  Administered 2015-03-14 – 2015-03-17 (×4): 2 via ORAL
  Filled 2015-03-14 (×4): qty 2

## 2015-03-14 MED ORDER — ONDANSETRON HCL 4 MG/2ML IJ SOLN: 4.0000 mg | Freq: Four times a day (QID) | INTRAMUSCULAR | Status: DC | PRN

## 2015-03-14 MED ORDER — SERTRALINE HCL 50 MG PO TABS
50.0000 mg | ORAL_TABLET | Freq: Every day | ORAL | Status: DC
  Administered 2015-03-14 – 2015-03-17 (×4): 50 mg via ORAL
  Filled 2015-03-14 (×4): qty 1

## 2015-03-14 MED ORDER — ONDANSETRON HCL 4 MG PO TABS: 4.0000 mg | ORAL_TABLET | Freq: Four times a day (QID) | ORAL | Status: DC | PRN

## 2015-03-14 MED ORDER — DOCUSATE SODIUM 100 MG PO CAPS
100.0000 mg | ORAL_CAPSULE | Freq: Two times a day (BID) | ORAL | Status: DC | PRN
Start: 2015-03-14 — End: 2015-03-17

## 2015-03-14 MED ORDER — APIXABAN 2.5 MG PO TABS
2.5000 mg | ORAL_TABLET | Freq: Two times a day (BID) | ORAL | Status: DC
  Administered 2015-03-14 – 2015-03-17 (×6): 2.5 mg via ORAL
  Filled 2015-03-14 (×7): qty 1

## 2015-03-14 MED ORDER — ROPINIROLE HCL 0.5 MG PO TABS
0.2500 mg | ORAL_TABLET | Freq: Three times a day (TID) | ORAL | Status: DC
  Administered 2015-03-14 – 2015-03-17 (×11): 0.25 mg via ORAL
  Filled 2015-03-14 (×13): qty 1

## 2015-03-14 MED ORDER — CEFTRIAXONE SODIUM 1 G IJ SOLR
1.0000 g | INTRAMUSCULAR | Status: DC
  Administered 2015-03-14 – 2015-03-17 (×4): 1 g via INTRAVENOUS
  Filled 2015-03-14 (×6): qty 10

## 2015-03-14 MED ORDER — PANTOPRAZOLE SODIUM 40 MG PO PACK
40.0000 mg | PACK | Freq: Every day | ORAL | Status: DC | PRN
  Filled 2015-03-14: qty 20

## 2015-03-14 MED ORDER — SODIUM CHLORIDE 0.9 % IJ SOLN
3.0000 mL | Freq: Two times a day (BID) | INTRAMUSCULAR | Status: DC
  Administered 2015-03-14 – 2015-03-17 (×7): 3 mL via INTRAVENOUS

## 2015-03-14 NOTE — Progress Notes (Signed)
Patient seen and examined this am   eliquis reduced per pharmacy  Continue rocpehin for UTI   PT/OT eval   Chest pain noncardiac , likely referred pain from back

## 2015-03-14 NOTE — Progress Notes (Signed)
Pt daughter in law called inquiring about pt care. She was very concerned about the pts pain. Will notify MD

## 2015-03-14 NOTE — Plan of Care (Signed)
Problem: Phase I Progression Outcomes Goal: OOB as tolerated unless otherwise ordered Outcome: Progressing Patient up to bedside commode with staff assistance.

## 2015-03-14 NOTE — Progress Notes (Signed)
Re admission determination found to be unrelated by medical director

## 2015-03-14 NOTE — ED Notes (Signed)
Spoke with bed control regarding bed.  States we are holding for cardiac Tele.

## 2015-03-14 NOTE — Care Management Note (Addendum)
Case Management Note  Patient Details  Name: SHAKORA NORDQUIST MRN: [id number] Date of Birth: 09/25/1928  Subjective/Objective:     81 yr old medicare female recently discharged home with family on 02/20/15.  During recent hospitalization 6/17-6/19/16 pt, family had been seen by CM and SW to offer services              PCP: [name], MD Discharge Diagnoses:  Principal Problem:  CVA (cerebral infarction) Active Problems:  Essential hypertension  Hypothyroidism  Paroxysmal atrial fibrillation  Hemiparesis and speech and language deficit as late effects of stroke Discharge Condition: improved  Action/Plan:  1047 Cm confirmed with Santiago Glad of [facility] that pt is active for PT/OT, ST and nursing  [facility] to follow pt while in [facility] for any d/c needs  Expected Discharge Date:   03/17/15               Expected Discharge Plan:    home with home health   In-House Referral:     Discharge planning Services   home with home health   Post Acute Care Choice:    Choice offered to:     DME Arranged:    DME Agency:     HH Arranged: PT, OT, Speech Therapy, RN [place] Agency: [place]  Status of Service:   in process      Additional Comments:  Robbie Lis, RN 03/14/2015, 10:49 AM

## 2015-03-14 NOTE — ED Notes (Signed)
Family left at this time

## 2015-03-14 NOTE — Progress Notes (Signed)
Pt bottom red on admission, sacral dressing placed. Will implement Q2H turning. No breakdown noted

## 2015-03-14 NOTE — H&P (Addendum)
PCP:  [name], MD  Neu[facility]   Referring provider Oni   Chief Complaint: confusion  HPI: Linda [name] is a 81 y.o. female   has a past medical history of History of cardiac catheterization (2002); Vertebral fracture; Restless legs syndrome with nocturnal myoclonus (05/13/2013); Abnormal uterine bleeding (8921,1941); Hypothyroidism; Arthritis; Osteoporosis; History of stomach ulcers; History of jaundice as a child; Anemia; Head pain, chronic; Dizziness; and Hemiparesis and speech and language deficit as late effects of stroke (02/09/2015).   Presented with patient was seen and evaluated on the seventh with abdominal pain at that point she reported no bowel movement for 3 days and lower abdominal pain. CT scan of the abdomen was not attached time the only finding was slightly swirled appearance mesentery in the right mid up abdomen thought to be secondary to prior surgery.  2 months ago patient has undergone hysterectomy for right ovarian mass which was found to be right ovarian fibroma. Patient was found to have urinary tract infection and was discharged home on Keflex. Unfortunately urine culture was not obtained at that time.  Patient has refused to get up for the past 3 days from her bed and family brought her in. Patient was also endorsing chest pain lasting 30 minutes. History was related through the family as patient is nonverbal secondary to prior stroke April 2016.  Patient has been nonambulatory  for the past few days.of note patient has had a negative cardiac catheterization 2002 Family reports no BM for the week or more. It is difficult to discern if patient is confused since patient is aphasic. She does endorse having thoracic as well as lower lumbar pain that was sudden in onset and reports that that is a cause for her not being able to get up.  Hospitalist was called for admission for UTI and confusion  Review of Systems:    Pertinent positives include:  Fevers, chills, questionable confusion, back pain constipation  Constitutional:  No weight loss, night sweats,  fatigue, weight loss  HEENT:  No headaches, Difficulty swallowing,Tooth/dental problems,Sore throat,  No sneezing, itching, ear ache, nasal congestion, post nasal drip,  Cardio-vascular:  No chest pain, Orthopnea, PND, anasarca, dizziness, palpitations.no Bilateral lower extremity swelling  GI:  No heartburn, indigestion, abdominal pain, nausea, vomiting, diarrhea, change in bowel habits, loss of appetite, melena, blood in stool, hematemesis Resp:  no shortness of breath at rest. No dyspnea on exertion, No excess mucus, no productive cough, No non-productive cough, No coughing up of blood.No change in color of mucus.No wheezing. Skin:  no rash or lesions. No jaundice GU:  no dysuria, change in color of urine, no urgency or frequency. No straining to urinate.  No flank pain.  Musculoskeletal:  No joint pain or no joint swelling. No decreased range of motion. No back pain.  Psych:  No change in mood or affect. No depression or anxiety. No memory loss.  Neuro: no localizing neurological complaints, no tingling, no weakness, no double vision, no gait abnormality, no slurred speech, no   Otherwise ROS are negative except for above, 10 systems were reviewed  Past Medical History: Past Medical History  Diagnosis Date  . History of cardiac catheterization 2002    Negative  . Vertebral fracture   . Restless legs syndrome with nocturnal myoclonus 05/13/2013     requip controlled   . Abnormal uterine bleeding [id number]  . Hypothyroidism   . Arthritis   . Osteoporosis   . History of stomach ulcers   .  History of jaundice as a child   . Anemia   . Head pain, chronic     "I have pain in the back of my head for months"  . Dizziness     TAKES MECLIZINE TO TREAT  . Hemiparesis and speech and language deficit as late effects of stroke 02/09/2015   Past Surgical History  Procedure  Laterality Date  . Back surgery      x 3  . Breast surgery Bilateral     1 lump each breast removed and benign  . Nasal sinus surgery    . Dilation and curettage of uterus  1970  . Catheterization  2003    cardiac cath  . Kyphoplasty  2010  . Cataract extraction Bilateral 2011  . Cholecystectomy  1995    Lap  . Cardiac catheterization  2002    "it was normal" per pt - no record found  . Robotic assisted salpingo oopherectomy Bilateral 11/02/2014    Procedure: ROBOTIC ASSISTED bilateral SALPINGO OOPHORECTOMY;  Surgeon: Everitt Amber, MD;  Location: [facility];  Service: Gynecology;  Laterality: Bilateral;  . Laparoscopy N/A 11/02/2014    Procedure: LAPAROSCOPY DIAGNOSTIC;  Surgeon: Everitt Amber, MD;  Location: [facility];  Service: Gynecology;  Laterality: N/A;  . Abdominal hysterectomy       Medications: Prior to Admission medications   Medication Sig Start Date End Date Taking? Authorizing Provider  acetaminophen (TYLENOL) 500 MG tablet Take 1,000 mg by mouth every 6 (six) hours as needed for mild pain. Usually only takes 2 x a  day   Yes Historical Provider, MD  apixaban (ELIQUIS) 5 MG TABS tablet Take 1 tablet (5 mg total) by mouth 2 (two) times daily. Please discontinue Aspirin once started on Eliquis! 02/15/15  Yes Kathrynn Ducking, MD  calcium gluconate 500 MG tablet Take 500 mg by mouth 2 (two) times daily. With 1000 units D3   Yes Historical Provider, MD  cephALEXin (KEFLEX) 500 MG capsule Take 1 capsule (500 mg total) by mouth 3 (three) times daily. 03/10/15  Yes Lacretia Leigh, MD  cholecalciferol (VITAMIN D) 1000 UNITS tablet Take 1,000 Units by mouth 2 (two) times daily. Take with calium   Yes Historical Provider, MD  Cranberry 200 MG CAPS Take 1 capsule by mouth daily with lunch.    Yes Historical Provider, MD  denosumab (PROLIA) 60 MG/ML SOLN injection Inject 60 mg into the skin every 6 (six) months. Administer in upper arm, thigh, or abdomen   Yes Historical Provider, MD  docusate sodium  (COLACE) 100 MG capsule Take 100 mg by mouth 2 (two) times daily as needed for mild constipation or moderate constipation.   Yes Historical Provider, MD  Flaxseed, Linseed, (FLAX SEED OIL) 1000 MG CAPS Take 1 capsule by mouth 2 (two) times daily.    Yes Historical Provider, MD  hyoscyamine (ANASPAZ) 0.125 MG TBDP disintergrating tablet Place 0.125 mg under the tongue 2 (two) times daily as needed for bladder spasms.   Yes Historical Provider, MD  levothyroxine (SYNTHROID, LEVOTHROID) 50 MCG tablet Take 50 mcg by mouth every morning.    Yes Historical Provider, MD  meclizine (ANTIVERT) 25 MG tablet Take 25 mg by mouth daily as needed for dizziness.  05/07/13  Yes Historical Provider, MD  metoprolol tartrate (LOPRESSOR) 25 MG tablet Take 25 mg by mouth 2 (two) times daily.   Yes Historical Provider, MD  Multiple Vitamins-Minerals (CENTRUM SILVER PO) Take 1 tablet by mouth daily with lunch.  Yes Historical Provider, MD  ondansetron (ZOFRAN) 4 MG tablet Take 1 tablet (4 mg total) by mouth every 8 (eight) hours as needed for nausea or vomiting. 11/02/14  Yes Lahoma Crocker, MD  pantoprazole sodium (PROTONIX) 40 mg/20 mL PACK Take 20 mLs (40 mg total) by mouth daily. Patient taking differently: Take 40 mg by mouth daily as needed (acid reflux, heartburn).  02/11/15  Yes Gildardo Cranker, DO  Propylene Glycol (SYSTANE BALANCE OP) Apply 1 drop to eye 2 (two) times daily.   Yes Historical Provider, MD  rOPINIRole (REQUIP) 0.25 MG tablet TAKE 1 TABLET BY MOUTH 3 TIMES A DAY 09/10/14  Yes Asencion Partridge Dohmeier, MD  sennosides-docusate sodium (SENOKOT-S) 8.6-50 MG tablet Take 2 tablets by mouth daily.   Yes Historical Provider, MD  sertraline (ZOLOFT) 50 MG tablet Take 50 mg by mouth daily.   Yes Historical Provider, MD  traMADol (ULTRAM) 50 MG tablet Take 50 mg by mouth every 6 (six) hours as needed for severe pain.   Yes Historical Provider, MD    Allergies:   Allergies  Allergen Reactions  . Ibuprofen Anaphylaxis      Passed out and had to be hospitalized  . Penicillins     Skin on head was crawling and very very sleepy  . Demerol [Meperidine Hcl] Nausea And Vomiting  . Dilaudid [Hydromorphone Hcl] Nausea And Vomiting  . Fentanyl Other (See Comments)    unknown  . Lidoderm [Lidocaine] Other (See Comments)    unknown  . Lyrica [Pregabalin]     "made me a zombie'  . Morphine And Related Nausea And Vomiting  . Other     hamburger  . Percocet [Oxycodone-Acetaminophen] Nausea And Vomiting  . Tramadol Other (See Comments)    unknown  . Vicodin [Hydrocodone-Acetaminophen] Nausea And Vomiting    Social History:  Ambulatory  Bed bound for the past 3 days was able to ambulate to bathroom prior Lives at home With family    reports that she has never smoked. She has never used smokeless tobacco. She reports that she does not drink alcohol or use illicit drugs.    Family History: family history includes Asthma in her brother; Cancer in her brother; Hypertension in her father; Osteoarthritis in her son; Stroke in her father.    Physical Exam: Patient Vitals for the past 24 hrs:  BP Temp Temp src Pulse Resp SpO2  03/14/15 0130 116/73 mmHg - - 91 18 98 %  03/14/15 0100 119/62 mmHg - - 83 (!) 32 97 %  03/14/15 0045 120/64 mmHg - - - 25 -  03/14/15 0033 - 98.6 F (37 C) Rectal - - -  03/14/15 0030 123/66 mmHg - - - (!) 36 99 %  03/13/15 2330 112/63 mmHg - - 90 (!) 37 96 %  03/13/15 2207 100/66 mmHg 97.6 F (36.4 C) Oral 75 14 98 %    1. General:  in No Acute distress 2. Psychological: Alert patient is aphasic but answers yes and no questions  3. Head/ENT:     Dry Mucous Membranes                          Head Non traumatic, neck supple                          Normal  Dentition 4. SKIN:  decreased Skin turgor,  Skin clean Dry and intact no rash 5. Heart: Regular  rate and rhythm no Murmur, Rub or gallop 6. Lungs: Clear to auscultation bilaterally, no wheezes or crackles   7. Abdomen: Soft,  non-tender, Non distended 8. Lower extremities: no clubbing, cyanosis, or edema 9. Neurologically strength appears to be slightly diminished on the right which is chronic given history of stroke 10. MSK: Normal range of motion Point tenderness over thoracic and lumbar spine Chest wall tenderness repair uses chest pain   body mass index is unknown because there is no weight on file.   Labs on Admission:   Results for orders placed or performed during the hospital encounter of 03/13/15 (from the past 24 hour(s))  CBC     Status: Abnormal   Collection Time: 03/13/15 10:22 PM  Result Value Ref Range   WBC 11.8 (H) 4.0 - 10.5 K/uL   RBC 4.69 3.87 - 5.11 MIL/uL   Hemoglobin 14.5 12.0 - 15.0 g/dL   HCT 44.3 36.0 - 46.0 %   MCV 94.5 78.0 - 100.0 fL   MCH 30.9 26.0 - 34.0 pg   MCHC 32.7 30.0 - 36.0 g/dL   RDW 13.2 11.5 - 15.5 %   Platelets 291 150 - 400 K/uL  Basic metabolic panel     Status: Abnormal   Collection Time: 03/13/15 10:22 PM  Result Value Ref Range   Sodium 134 (L) 135 - 145 mmol/L   Potassium 3.9 3.5 - 5.1 mmol/L   Chloride 99 (L) 101 - 111 mmol/L   CO2 27 22 - 32 mmol/L   Glucose, Bld 137 (H) 65 - 99 mg/dL   BUN 21 (H) 6 - 20 mg/dL   Creatinine, Ser 1.16 (H) 0.44 - 1.00 mg/dL   Calcium 9.2 8.9 - 10.3 mg/dL   GFR calc non Af Amer 42 (L) >60 mL/min   GFR calc Af Amer 49 (L) >60 mL/min   Anion gap 8 5 - 15  I-stat troponin, ED  (not at [facility])     Status: None   Collection Time: 03/13/15 10:26 PM  Result Value Ref Range   Troponin i, poc 0.01 0.00 - 0.08 ng/mL   Comment 3          I-Stat CG4 Lactic Acid, ED     Status: None   Collection Time: 03/13/15 11:32 PM  Result Value Ref Range   Lactic Acid, Venous 1.90 0.5 - 2.0 mmol/L  APTT     Status: None   Collection Time: 03/13/15 11:40 PM  Result Value Ref Range   aPTT 32 24 - 37 seconds  Protime-INR     Status: Abnormal   Collection Time: 03/13/15 11:40 PM  Result Value Ref Range   Prothrombin Time 22.1 (H)  11.6 - 15.2 seconds   INR 1.95 (H) 0.00 - 1.49  TSH     Status: Abnormal   Collection Time: 03/13/15 11:40 PM  Result Value Ref Range   TSH 5.296 (H) 0.350 - 4.500 uIU/mL  T4, free     Status: Abnormal   Collection Time: 03/13/15 11:40 PM  Result Value Ref Range   Free T4 1.55 (H) 0.61 - 1.12 ng/dL  Urinalysis, Routine w reflex microscopic (not at [facility])     Status: Abnormal   Collection Time: 03/14/15 12:35 AM  Result Value Ref Range   Color, Urine AMBER (A) YELLOW   APPearance CLEAR CLEAR   Specific Gravity, Urine 1.020 1.005 - 1.030   pH 6.0 5.0 - 8.0   Glucose, UA NEGATIVE NEGATIVE mg/dL  Hgb urine dipstick NEGATIVE NEGATIVE   Bilirubin Urine NEGATIVE NEGATIVE   Ketones, ur 15 (A) NEGATIVE mg/dL   Protein, ur 100 (A) NEGATIVE mg/dL   Urobilinogen, UA 1.0 0.0 - 1.0 mg/dL   Nitrite NEGATIVE NEGATIVE   Leukocytes, UA NEGATIVE NEGATIVE  Urine microscopic-add on     Status: Abnormal   Collection Time: 03/14/15 12:35 AM  Result Value Ref Range   Squamous Epithelial / LPF RARE RARE   WBC, UA 7-10 <3 WBC/hpf   RBC / HPF 0-2 <3 RBC/hpf   Bacteria, UA RARE RARE   Crystals CA OXALATE CRYSTALS (A) NEGATIVE    UA 7-10 WBC worrisome for UTI  Lab Results  Component Value Date   HGBA1C 5.9* 12/26/2014    CrCl cannot be calculated (Unknown ideal weight.).  BNP (last 3 results) No results for input(s): PROBNP in the last 8760 hours.  Other results:  I have pearsonaly reviewed this: ECG REPORT  Rate: 92  Rhythm: a.fib ST&T Change: no ischemic changes QTC 453  There were no vitals filed for this visit.   Cultures:    Component Value Date/Time   SDES STOOL 05/27/2009 0713   SDES STOOL 05/27/2009 0713   SPECREQUEST IMMUNE:NORM 05/27/2009 0713   SPECREQUEST IMMUNE:NORM 05/27/2009 0713   CULT  05/27/2009 0713    NO SALMONELLA, SHIGELLA, CAMPYLOBACTER, OR YERSINIA ISOLATED Note: REDUCED NORMAL FLORA PRESENT   REPTSTATUS 05/30/2009 FINAL 05/27/2009 0713   REPTSTATUS  05/27/2009 FINAL 05/27/2009 0713     Radiological Exams on Admission: Dg Chest 2 View  03/14/2015   CLINICAL DATA:  Mid chest pain and dizziness.  EXAM: CHEST  2 VIEW  COMPARISON:  02/25/2015  FINDINGS: Hyperinflation consistent with emphysema. Normal heart size and pulmonary vascularity. Calcified granulomas in the right lung. Opacification of the left lung base posteriorly probably represents small pleural effusion. No focal consolidation in the lungs. No pneumothorax. Calcified and tortuous aorta. Scoliosis and degenerative changes in the thoracic spine with post kyphoplasty changes in the mid thoracic region.  IMPRESSION: Emphysematous changes.  Probable small left pleural effusion.   Electronically Signed   By: Lucienne Capers M.D.   On: 03/14/2015 00:13   Ct Head Wo Contrast  03/14/2015   CLINICAL DATA:  81 year old female with chest pain. Patient has history of prior CVA  EXAM: CT HEAD WITHOUT CONTRAST  TECHNIQUE: Contiguous axial images were obtained from the base of the skull through the vertex without intravenous contrast.  COMPARISON:  Head CT dated 02/18/2015 and MRI dated 02/26/2015  FINDINGS: The ventricles are dilated and the sulci are prominent compatible with age-related atrophy. Periventricular and deep white matter hypodensities represent chronic microvascular ischemic changes. Left frontoparietal as well as left occipital hypodensities correspond to the infarcts seen on the prior study. There is no intracranial hemorrhage. No mass effect or midline shift identified.  The visualized paranasal sinuses and mastoid air cells are well aerated. The calvarium is intact.  The rectum  IMPRESSION: No acute intracranial pathology.  Age-related atrophy and chronic microvascular ischemic disease. Progression of the left frontoparietal and left occipital infarcts with encephalomalacia changes in.  If symptoms persist and there are no contraindications, MRI may provide better evaluation if clinically  indicated.   Electronically Signed   By: Anner Crete M.D.   On: 03/14/2015 00:29    Chart has been reviewed  Family   at  Bedside  plan of care was discussed with  Sons Linda [name] cell [phone], home [phone]  Assessment/Plan  81 year old female history of atrial fibrillation on chronic anticoagulation recently treated for urinary tract infection presents with inability to get up secondary to back pain. Evidence of mild persistent UTI   Present on Admission:  . Essential hypertension continue home medications  . Paroxysmal atrial fibrillation continue metoprolol and eliquis . Encephalopathy - patient has chronic aphasia status post her stroke appears to understand questions has been asked unclear how oriented she is  . UTI (lower urinary tract infection) residual UTI Will obtain urine culture and treat the Rocephin for now  . Chest pain we'll obtain rib imaging S patient having point tenderness over the rib cage. No evidence of trauma   given back pain we will obtain thoracic and lumbar plain films to start with. Will need PT OT evaluation families interested in home health  Constipation will  order bowel regimen Prophylaxis: on Eliquis  CODE STATUS:   DNR/DNI as per patient  And family  Disposition:   To home once workup is complete and patient is stable, family is interested in keeping her home for as long as able  Other plan as per orders.  I have spent a total of 55 min on this admission  Missie Gehrig 03/14/2015, 1:52 AM  [facility]  Pager [phone]   after 2 AM please page floor coverage PA If 7AM-7PM, please contact the day team taking care of the patient  [email or web address]  Password [email or web address]

## 2015-03-15 LAB — CBC
HCT: 39.4 % (ref 36.0–46.0)
Hemoglobin: 13.5 g/dL (ref 12.0–15.0)
MCH: 31.8 pg (ref 26.0–34.0)
MCHC: 34.3 g/dL (ref 30.0–36.0)
MCV: 92.7 fL (ref 78.0–100.0)
PLATELETS: 266 10*3/uL (ref 150–400)
RBC: 4.25 MIL/uL (ref 3.87–5.11)
RDW: 13.2 % (ref 11.5–15.5)
WBC: 8.9 10*3/uL (ref 4.0–10.5)

## 2015-03-15 LAB — URINE CULTURE: Culture: NO GROWTH

## 2015-03-15 MED ORDER — CIPROFLOXACIN HCL 0.3 % OP SOLN
1.0000 [drp] | Freq: Two times a day (BID) | OPHTHALMIC | Status: DC
  Administered 2015-03-15 – 2015-03-16 (×3): 1 [drp] via OPHTHALMIC
  Filled 2015-03-15: qty 2.5

## 2015-03-15 NOTE — Evaluation (Signed)
Physical Therapy Evaluation Patient Details Name: Linda [name] MRN: [id number] DOB: [birth year] Today's Date: 03/15/2015   History of Present Illness  81 yo female with onset of R UE and RLE weakness, with new on chronic strokes of  acute L inferior med occipital lobe, recent L MCA stroke   Clinical Impression  Pt was seen for evaluation and noted her dense weakness that merits consideration for CIR.  Her prior level was to be home with family assist but is much weaker since more recent neuro event.  Pt in agreement to try for CIR admission and shared with nursing as well.    Follow Up Recommendations CIR    Equipment Recommendations  None recommended by PT    Recommendations for Other Services Rehab consult     Precautions / Restrictions Precautions Precautions: Fall Precaution Comments: telemetry Restrictions Weight Bearing Restrictions: No      Mobility  Bed Mobility Overal bed mobility: Needs Assistance Bed Mobility: Supine to Sit;Sit to Supine     Supine to sit: Min assist;Mod assist Sit to supine: Mod assist   General bed mobility comments: pt relatively weak to lift LE's onto bed  Transfers Overall transfer level: Needs assistance Equipment used: Rolling walker (2 wheeled);1 person hand held assist   Sit to Stand: Min assist;Mod assist Stand pivot transfers: Mod assist       General transfer comment: mod to pwer up and min mod to control once up;  pivot mod assist  Ambulation/Gait     Assistive device: 1 person hand held assist;Rolling walker (2 wheeled)   Gait velocity: Decreased   General Gait Details: Pt sidestepped up bed due to frequent sitting once up  Stairs            Wheelchair Mobility    Modified Rankin (Stroke Patients Only) Modified Rankin (Stroke Patients Only) Pre-Morbid Rankin Score: Moderate disability Modified Rankin: Moderately severe disability     Balance     Sitting balance-Leahy Scale: Fair        Standing balance-Leahy Scale: Poor                               Pertinent Vitals/Pain      Home Living Family/patient expects to be discharged to:: Private residence Living Arrangements: Children Available Help at Discharge: Family;Friend(s);Available PRN/intermittently Type of Home: House Home Access: Stairs to enter Entrance Stairs-Rails: Left Entrance Stairs-Number of Steps: 4 Home Layout: Two level;Laundry or work area in basement;Able to live on main level with bedroom/bathroom Home Equipment: [name] - single point;Walker - 2 wheels;Shower seat;Tub bench;Transport chair;Cane - quad      Prior Function Level of Independence: Needs assistance   Gait / Transfers Assistance Needed: Using cane/RW - assist for ambulation  ADL's / Homemaking Assistance Needed: Assist for ADL's   Comments: Previously ambulates with cane; still driving short distances     Hand Dominance   Dominant Hand: Right    Extremity/Trunk Assessment                 RLE Deficits / Details: Similar strength but weak B    Cervical / Trunk Assessment: Kyphotic  Communication   Communication: Expressive difficulties;Receptive difficulties  Cognition Arousal/Alertness: Awake/alert Behavior During Therapy: WFL for tasks assessed/performed;Anxious Overall Cognitive Status: History of cognitive impairments - at baseline                 General Comments: Non verbal the majority  of session and then some broken phrases    General Comments      Exercises        Assessment/Plan    PT Assessment Patient needs continued PT services  PT Diagnosis Difficulty walking;Hemiplegia dominant side   PT Problem List Decreased strength;Decreased range of motion;Decreased activity tolerance;Decreased balance;Decreased mobility;Decreased coordination;Decreased cognition;Decreased knowledge of use of DME;Decreased safety awareness;Decreased knowledge of precautions;Cardiopulmonary status  limiting activity  PT Treatment Interventions DME instruction;Gait training;Stair training;Functional mobility training;Therapeutic activities;Therapeutic exercise;Balance training;Neuromuscular re-education;Cognitive remediation;Patient/family education   PT Goals (Current goals can be found in the Care Plan section) Acute Rehab PT Goals Patient Stated Goal: None stated PT Goal Formulation: Patient unable to participate in goal setting Time For Goal Achievement: 03/29/15 Potential to Achieve Goals: Good    Frequency Min 5X/week   Barriers to discharge Inaccessible home environment;Decreased caregiver support      Co-evaluation               End of Session Equipment Utilized During Treatment: Gait belt Activity Tolerance: Patient tolerated treatment well;Patient limited by fatigue Patient left: in bed;with call bell/phone within reach;with bed alarm set Nurse Communication: Mobility status         Time: 1435-1451 PT Time Calculation (min) (ACUTE ONLY): 16 min   Charges:   PT Evaluation $Initial PT Evaluation Tier I: 1 Procedure     PT G CodesRamond Dial 2015-03-21, 4:45 PM   Mee Hives, PT MS Acute Rehab Dept. Number: [facility] [id number] and [place] [phone]

## 2015-03-15 NOTE — Discharge Instructions (Signed)

## 2015-03-15 NOTE — Progress Notes (Signed)
[facility] PROGRESS NOTE  Linda [name]:[id number] DOB: March 15, 1929 DOA: 03/13/2015 PCP: [name], MD  Assessment/Plan: Active Problems:   Essential hypertension   Paroxysmal atrial fibrillation   Long-term (current) use of anticoagulants   Encephalopathy   UTI (lower urinary tract infection)   Altered mental state   Chest pain    Altered mental status/fatigue,. Encephalopathy - patient has chronic aphasia status post her stroke appears to understand questions , also has a mild urinary tract infection   Chest pain-ruled out for acute coronary syndrome  UTI , follow urine culture, continue Rocephin  Recurrent CVA, continue eloquent   Essential hypertension continue home medications   . Paroxysmal atrial fibrillation continue metoprolol and eliquis    . Chest pain , patient has been chest pain-free  for 2 days, negative rib imaging, x-rays of the thoracic spine show kyphoscoliosis, compression deformity at T8 and T9, status post kyphoplasty, no acute thoracic fractures, lumbar scoliosis with degenerative changes. Awaiting final recommendations from  PT OT evaluation   Constipation will order bowel regimen  Conjunctivitis, started on ciprofloxacin eyedrops  Code Status:      Code Status Orders        Start     Ordered   03/14/15 0626  Do not attempt resuscitation (DNR)   Continuous    Question Answer Comment  In the event of cardiac or respiratory ARREST Do not call a "code blue"   In the event of cardiac or respiratory ARREST Do not perform Intubation, CPR, defibrillation or ACLS   In the event of cardiac or respiratory ARREST Use medication by any route, position, wound care, and other measures to relive pain and suffering. May use oxygen, suction and manual treatment of airway obstruction as needed for comfort.      03/14/15 0626    Advance Directive Documentation        Most Recent Value   Type of Advance Directive  Healthcare  Power of Attorney   Pre-existing out of facility DNR order (yellow form or pink MOST form)     "MOST" Form in Place?       Family Communication: Tried to reach the patient's son home number, when into [place], did not leave a message  Disposition Plan:  Anticipate discharge in one to 2 days    Brief narrative: Linda [name] is a 81 y.o. female   has a past medical history of History of cardiac catheterization (2002); Vertebral fracture; Restless legs syndrome with nocturnal myoclonus (05/13/2013); Abnormal uterine bleeding ([id number]); Hypothyroidism; Arthritis; Osteoporosis; History of stomach ulcers; History of jaundice as a child; Anemia; Head pain, chronic; Dizziness; and Hemiparesis and speech and language deficit as late effects of stroke (02/09/2015).   Presented with patient was seen and evaluated on the seventh with abdominal pain at that point she reported no bowel movement for 3 days and lower abdominal pain. CT scan of the abdomen was not attached time the only finding was slightly swirled appearance mesentery in the right mid up abdomen thought to be secondary to prior surgery. 2 months ago patient has undergone hysterectomy for right ovarian mass which was found to be right ovarian fibroma. Patient was found to have urinary tract infection and was discharged home on Keflex. Unfortunately urine culture was not obtained at that time.  Patient has refused to get up for the past 3 days from her bed and family brought her in. Patient was also endorsing chest pain lasting  30 minutes. History was related through the family as patient is nonverbal secondary to prior stroke April 2016.  Patient has been nonambulatory for the past few days.of note patient has had a negative cardiac catheterization 2002 Family reports no BM for the week or more. It is difficult to discern if patient is confused since patient is aphasic. She does endorse having thoracic as well as lower lumbar pain that was  sudden in onset and reports that that is a cause for her not being able to get up.  Hospitalist was called for admission for UTI and confusion  Consultants:  None   Procedures:  None   Antibiotics: Anti-infectives    Start     Dose/Rate Route Frequency Ordered Stop   03/14/15 0630  cefTRIAXone (ROCEPHIN) 1 g in dextrose 5 % 50 mL IVPB     1 g 100 mL/hr over 30 Minutes Intravenous Every 24 hours 03/14/15 0627           HPI/Subjective: Very lethargic , not able to provide much hx   Objective: Filed Vitals:   03/14/15 2142 03/15/15 0032 03/15/15 0400 03/15/15 0830  BP: 102/54 107/64 112/75 96/56  Pulse:  86 82 85  Temp:  98.6 F (37 C) 97.4 F (36.3 C) 98.3 F (36.8 C)  TempSrc:    Oral  Resp:  12 12 12   Height:      Weight:   45.949 kg (101 lb 4.8 oz)   SpO2:  99% 100% 99%    Intake/Output Summary (Last 24 hours) at 03/15/15 1105 Last data filed at 03/15/15 1003  Gross per 24 hour  Intake    890 ml  Output    500 ml  Net    390 ml    Exam:  General: No acute respiratory distress Lungs: Clear to auscultation bilaterally without wheezes or crackles Cardiovascular: Regular rate and rhythm without murmur gallop or rub normal S1 and S2 Abdomen: Nontender, nondistended, soft, bowel sounds positive, no rebound, no ascites, no appreciable mass Extremities: No significant cyanosis, clubbing, or edema bilateral lower extremities     Data Review   Micro Results No results found for this or any previous visit (from the past 240 hour(s)).  Radiology Reports Dg Chest 2 View  03/14/2015   CLINICAL DATA:  Mid chest pain and dizziness.  EXAM: CHEST  2 VIEW  COMPARISON:  02/25/2015  FINDINGS: Hyperinflation consistent with emphysema. Normal heart size and pulmonary vascularity. Calcified granulomas in the right lung. Opacification of the left lung base posteriorly probably represents small pleural effusion. No focal consolidation in the lungs. No pneumothorax.  Calcified and tortuous aorta. Scoliosis and degenerative changes in the thoracic spine with post kyphoplasty changes in the mid thoracic region.  IMPRESSION: Emphysematous changes.  Probable small left pleural effusion.   Electronically Signed   By: Lucienne Capers M.D.   On: 03/14/2015 00:13   Dg Chest 2 View  02/26/2015   CLINICAL DATA:  Right hand numbness  EXAM: CHEST  2 VIEW  COMPARISON:  12/25/2014  FINDINGS: Chronic interstitial markings/ emphysematous changes. Small left pleural effusion, improved. No pneumothorax.  Cardiomegaly.  Degenerative changes of the visualized thoracolumbar spine. Mid/lower thoracic vertebral augmentation.  IMPRESSION: Small left pleural effusion, improved.   Electronically Signed   By: Julian Hy M.D.   On: 02/26/2015 00:57   Dg Ribs Unilateral Left  03/14/2015   CLINICAL DATA:  Pain across the chest for 30 minutes.  EXAM: LEFT RIBS - 2 VIEW  COMPARISON:  Chest 03/13/2015  FINDINGS: Left ribs appear intact. No displaced fractures or focal bone lesions are identified.  IMPRESSION: No acute bony abnormalities.   Electronically Signed   By: Lucienne Capers M.D.   On: 03/14/2015 04:15   Dg Thoracic Spine 2 View  03/14/2015   CLINICAL DATA:  Pain across the chest for 30 minutes. Patient is nonverbal from previous stroke.  EXAM: THORACIC SPINE - 2-3 VIEWS  COMPARISON:  Chest 03/13/2015  FINDINGS: Thoracic scoliosis convex towards the right. Diffuse degenerative changes. Mid thoracic vertebral compression deformities post kyphoplasty. No anterior subluxation of the thoracic spine. There is evidence of mild anterior subluxation of C4 on C5. This is of uncertain chronicity. No prior comparison studies at this air found.  IMPRESSION: Thoracic kyphoscoliosis. Compression deformities at T8 and T9 post kyphoplasty. No acute thoracic fractures identified. There is anterior subluxation noted at C4-5 of nonspecific etiology.   Electronically Signed   By: Lucienne Capers M.D.    On: 03/14/2015 04:13   Dg Lumbar Spine 2-3 Views  03/14/2015   CLINICAL DATA:  Pain across the chest for 30 minutes.  Back pain.  EXAM: LUMBAR SPINE - 2-3 VIEW  COMPARISON:  CT abdomen and pelvis 03/10/2015  FINDINGS: Lumbar scoliosis convex towards the left. Diffuse degenerative changes with endplate hypertrophic changes throughout and mild disc space narrowing. Normal alignment of the lumbar spine. No vertebral compression deformities. No focal bone lesions identified. Surgical clips in the abdomen.  IMPRESSION: Lumbar scoliosis with degenerative changes. No acute displaced fractures identified.   Electronically Signed   By: Lucienne Capers M.D.   On: 03/14/2015 04:14   Dg Abd 1 View  03/14/2015   CLINICAL DATA:  Pain across the chest for 30 minutes.  Constipation.  EXAM: ABDOMEN - 1 VIEW  COMPARISON:  CT abdomen and pelvis 03/10/2015  FINDINGS: Residual contrast material in the left colon. No small or large bowel distention. Surgical clips in the right abdomen. Degenerative changes in the spine and hips. No radiopaque stones. Vascular calcifications.  IMPRESSION: Residual contrast material in the colon. No evidence of bowel obstruction.   Electronically Signed   By: Lucienne Capers M.D.   On: 03/14/2015 04:16   Ct Head Wo Contrast  03/14/2015   CLINICAL DATA:  81 year old female with chest pain. Patient has history of prior CVA  EXAM: CT HEAD WITHOUT CONTRAST  TECHNIQUE: Contiguous axial images were obtained from the base of the skull through the vertex without intravenous contrast.  COMPARISON:  Head CT dated 02/18/2015 and MRI dated 02/26/2015  FINDINGS: The ventricles are dilated and the sulci are prominent compatible with age-related atrophy. Periventricular and deep white matter hypodensities represent chronic microvascular ischemic changes. Left frontoparietal as well as left occipital hypodensities correspond to the infarcts seen on the prior study. There is no intracranial hemorrhage. No mass  effect or midline shift identified.  The visualized paranasal sinuses and mastoid air cells are well aerated. The calvarium is intact.  The rectum  IMPRESSION: No acute intracranial pathology.  Age-related atrophy and chronic microvascular ischemic disease. Progression of the left frontoparietal and left occipital infarcts with encephalomalacia changes in.  If symptoms persist and there are no contraindications, MRI may provide better evaluation if clinically indicated.   Electronically Signed   By: Anner Crete M.D.   On: 03/14/2015 00:29   Ct Head (brain) Wo Contrast  02/25/2015   CLINICAL DATA:  Acute onset of right forearm and hand numbness and tingling. Underlying symptoms  from chronic infarct. Initial encounter.  EXAM: CT HEAD WITHOUT CONTRAST  TECHNIQUE: Contiguous axial images were obtained from the base of the skull through the vertex without intravenous contrast.  COMPARISON:  CT of the head performed 02/18/2015, and MRI of the brain performed 02/19/2015  FINDINGS: Evolving subacute to chronic infarcts are again noted at the MCA territories bilaterally, larger on the left, and also at the left PCA territory, along the left occipital lobe. These are similar in appearance to prior studies.  Underlying moderate cortical volume loss is noted. Mild cerebellar atrophy is seen. Scattered periventricular white matter change likely reflects small vessel ischemic microangiopathy.  The brainstem and fourth ventricle are within normal limits. No midline shift is seen.  There is no evidence of fracture; visualized osseous structures are unremarkable in appearance. The orbits are within normal limits. The paranasal sinuses and mastoid air cells are well-aerated. Trace air lateral to the right orbit and along the right cavernous sinus likely reflects air from an IV catheter.  IMPRESSION: 1. No acute intracranial pathology seen to explain new symptoms. 2. Evolving subacute to chronic infarcts at the MCA territories  bilaterally, larger on the left, and also the left PCA territory, along the left occipital lobe. These are similar in appearance to prior studies. 3. Underlying moderate cortical volume loss noted. Scattered small vessel ischemic microangiopathy seen.   Electronically Signed   By: Garald Balding M.D.   On: 02/25/2015 23:27   Ct Head Wo Contrast  02/18/2015   CLINICAL DATA:  Right hand numbness and tingling ; facial and bilateral lower extremity numbness  EXAM: CT HEAD WITHOUT CONTRAST  TECHNIQUE: Contiguous axial images were obtained from the base of the skull through the vertex without intravenous contrast.  COMPARISON:  December 25, 2014 head CT and brain MRI  FINDINGS: There is moderate diffuse atrophy. There is no intracranial mass, hemorrhage, extra-axial fluid collection, or midline shift. There is evidence of an acute infarct in the inferior, medial left occipital lobe with cytotoxic edema in this area. There is evidence of a prior infarct in the left superior temporal lobe extending to the left temporal -occipital junction. There is an infarct in the posterior right frontal lobe which appears slightly larger than on recent prior studies. There is patchy small vessel disease in the centra semiovale bilaterally.  Bony calvarium appears intact. There is patchy mastoid disease bilaterally.  IMPRESSION: Findings consistent with an acute infarct in the left inferior, medial occipital lobe. Prior left temporal and posterior right frontal lobe infarcts. The posterior right frontal infarct may have extended since recent studies and may have an acute component superimposed on chronic change. Atrophy with mild small vessel disease. No hemorrhage or mass effect. Patchy ethmoid sinus disease bilaterally.  These results were called by telephone at the time of interpretation on 02/18/2015 at 10:07 pm to Dr. Elnora Morrison , who verbally acknowledged these results.   Electronically Signed   By: Lowella Grip III M.D.    On: 02/18/2015 22:08   Mr Brain Wo Contrast  02/26/2015   CLINICAL DATA:  RIGHT upper extremity numbness beginning at 8:15 tonight. History of TIA 1 week ago, prior stroke with residual RIGHT-sided deficits. History of headache, dizziness.  EXAM: MRI HEAD WITHOUT CONTRAST  TECHNIQUE: Multiplanar, multiecho pulse sequences of the brain and surrounding structures were obtained without intravenous contrast.  COMPARISON:  CT head February 25, 2015 at 2321 hours and MRI of the head February 19, 2015  FINDINGS: Faint reduced diffusion  within the LEFT greater the RIGHT frontal lobes, LEFT occipital lobe evolving from prior examination with normalized ADC values. Associated FLAIR hyperintense signal, and minimal T1 shortening consistent with mineralization. No reduced diffusion to suggest acute ischemia. No susceptibility artifact to suggest hemorrhage. No midline shift, mass effect or mass lesions. Moderate to severe ventriculomegaly, on the basis of global parenchymal brain volume loss as there is overall commensurate enlargement of cerebral sulci and cerebellar folia. Patchy white matter changes exclusive of the aforementioned abnormality consistent with mild to moderate chronic small vessel ischemic disease.  No abnormal extra-axial fluid collections. Normal major intracranial vascular flow voids seen at the skull base. Status post bilateral ocular lens implants. Trace ethmoid mucosal thickening, mastoid air cells are well aerated. No abnormal sellar expansion. No suspicious calvarial bone marrow signal. No cerebellar tonsillar ectopia.  IMPRESSION: No acute ischemia.  Early chronic bifrontal (MCA territory), LEFT occipital lobe (PCA territory) infarcts.  Involutional changes. Mild to moderate white matter changes compatible with chronic small vessel ischemic disease.   Electronically Signed   By: Elon Alas M.D.   On: 02/26/2015 01:20   Mr Brain Wo Contrast  02/19/2015   CLINICAL DATA:  81 year old female with  history of recent stroke secondary to AFib with resultant right-sided weakness and expressive aphasia, now presenting with increased right upper extremity weakness and sensory deficits.  EXAM: MRI HEAD WITHOUT CONTRAST  TECHNIQUE: Multiplanar, multiecho pulse sequences of the brain and surrounding structures were obtained without intravenous contrast.  COMPARISON:  Prior CT from earlier the same day as well as previous MRI from 12/25/2014  FINDINGS: There has been interval evolution of recently identified left MCA territory infarct involving the left insular cortex and operculum. This area of infarction now demonstrates encephalomalacia with associated laminar necrosis. There is persistent DWI signal abnormality about the margins of this infarction. In addition, there has been interval evolution of a right MCA territory infarct involving the anterior right frontal lobe which now demonstrates increased encephalomalacia as compared to the previous study. There is persistent DWI signal abnormality around this area of infarction is well. Both of these infarcts were present on the previous MRI from 12/25/2014.  There is a new ischemic infarct involving the left occipital lobe and the left PCA territory (series 4, image 20). This appears to be subacute in nature with fairly heterogeneous DWI signal intensity and normalized ADC signal, with associated early laminar necrosis and encephalomalacia.  No other infarct identified. No acute or chronic intracranial hemorrhage. Normal intravascular flow voids are preserved.  No mass lesion, mass effect, or midline shift.  Generalized cerebral atrophy with mild chronic microvascular ischemic disease noted. Ventricular prominence is stable from previous study, likely related to global parenchymal volume loss. No hydrocephalus. No extra-axial fluid collection.  Craniocervical junction within normal limits. Visualized upper cervical spine demonstrates no acute abnormality. Pituitary  gland normal. No acute abnormality about the orbits.  Mild mucosal thickening present within the ethmoidal air cells. Minimal fluid opacity present within the left mastoid air cells posteriorly. Inner ear structures normal.  Bone marrow signal intensity within normal limits. Scalp soft tissues within normal limits.  IMPRESSION: 1. Findings consistent with subacute ischemic left PCA territory infarct involving the left occipital lobe. 2. Interval evolution of late subacute to chronic bilateral MCA territory infarcts involving both the left and right frontal lobes as above. No acute component identified about these infarcts. 3. Moderate generalized cerebral atrophy.   Electronically Signed   By: Pincus Badder.D.  On: 02/19/2015 01:21   Ct Abdomen Pelvis W Contrast  03/10/2015   CLINICAL DATA:  No bowel movement for 3 days, lower abdominal pain, hysterectomy 2 months ago, history of ulcer disease, stroke  EXAM: CT ABDOMEN AND PELVIS WITH CONTRAST  TECHNIQUE: Multidetector CT imaging of the abdomen and pelvis was performed using the standard protocol following bolus administration of intravenous contrast. Sagittal and coronal MPR images reconstructed from axial data set.  CONTRAST:  67mL OMNIPAQUE IOHEXOL 300 MG/ML SOLN IV. Dilute oral contrast.  COMPARISON:  10/19/2013  FINDINGS: Dependent atelectasis at both lung bases with small LEFT pleural effusion.  Scattered atherosclerotic calcifications without aneurysm.  Tiny BILATERAL renal cysts.  Single small calcification within pancreatic tail.  Liver, spleen, pancreas, kidneys, and adrenal glands otherwise normal.  Gallbladder surgically absent.  History indicates prior hysterectomy but soft tissue identified cranial to the vagina question retained cervix versus atrophic uterus.  Neither ovary visualized.  Appendix not definitely identified.  Slightly swirled appearance of mesentery on coronal images in the RIGHT mid abdomen/upper RIGHT pelvis, where a  surgical clip is identified, question related to prior surgery or internal hernia ; however, no evidence of closed-loop obstruction or bowel dilatation/obstruction identified.  Stomach collapsed with prominence of the antral wall, question wall thickening versus artifact from underdistention.  Sigmoid diverticulosis without definite evidence of diverticulitis.  Remaining bowel loops normal appearance.  No mass, adenopathy, free fluid or free air.  Bones diffusely demineralized.  IMPRESSION: No definite acute intra-abdominal or intrapelvic process as above.  Unable to exclude gastric antral wall thickening though this may be an artifact related to underdistention.  Slightly swirled appearance of the mesentery in RIGHT mid abdomen on coronal images could be related to prior surgery as discussed above, without evidence of bowel obstruction.  Sigmoid diverticulosis.   Electronically Signed   By: Lavonia Dana M.D.   On: 03/10/2015 17:27     CBC  Recent Labs Lab 03/10/15 1635 03/13/15 2222 03/14/15 0642 03/15/15 0420  WBC 9.7 11.8* 9.8 8.9  HGB 14.7 14.5 12.3 13.5  HCT 45.0 44.3 36.4 39.4  PLT 232 291 225 266  MCV 93.6 94.5 92.2 92.7  MCH 30.6 30.9 31.1 31.8  MCHC 32.7 32.7 33.8 34.3  RDW 13.2 13.2 13.1 13.2  LYMPHSABS 1.4  --   --   --   MONOABS 1.1*  --   --   --   EOSABS 0.1  --   --   --   BASOSABS 0.0  --   --   --     Chemistries   Recent Labs Lab 03/10/15 1635 03/13/15 2222 03/14/15 0642  NA 136 134* 135  K 4.0 3.9 3.7  CL 100* 99* 104  CO2 29 27 25   GLUCOSE 94 137* 112*  BUN 23* 21* 21*  CREATININE 1.04* 1.16* 0.83  CALCIUM 9.9 9.2 8.0*  MG  --   --  1.9  AST 23  --  19  ALT 21  --  17  ALKPHOS 82  --  76  BILITOT 1.4*  --  0.7   ------------------------------------------------------------------------------------------------------------------ estimated creatinine clearance is 36.6 mL/min (by C-G formula based on Cr of  0.83). ------------------------------------------------------------------------------------------------------------------ No results for input(s): HGBA1C in the last 72 hours. ------------------------------------------------------------------------------------------------------------------ No results for input(s): CHOL, HDL, LDLCALC, TRIG, CHOLHDL, LDLDIRECT in the last 72 hours. ------------------------------------------------------------------------------------------------------------------  Recent Labs  03/13/15 2340  TSH 5.296*   ------------------------------------------------------------------------------------------------------------------ No results for input(s): VITAMINB12, FOLATE, FERRITIN, TIBC, IRON, RETICCTPCT in the  last 72 hours.  Coagulation profile  Recent Labs Lab 03/13/15 2340  INR 1.95*    No results for input(s): DDIMER in the last 72 hours.  Cardiac Enzymes  Recent Labs Lab 03/14/15 0642 03/14/15 1103  TROPONINI <0.03 <0.03   ------------------------------------------------------------------------------------------------------------------ Invalid input(s): POCBNP   CBG: No results for input(s): GLUCAP in the last 168 hours.     Studies: Dg Chest 2 View  03/14/2015   CLINICAL DATA:  Mid chest pain and dizziness.  EXAM: CHEST  2 VIEW  COMPARISON:  02/25/2015  FINDINGS: Hyperinflation consistent with emphysema. Normal heart size and pulmonary vascularity. Calcified granulomas in the right lung. Opacification of the left lung base posteriorly probably represents small pleural effusion. No focal consolidation in the lungs. No pneumothorax. Calcified and tortuous aorta. Scoliosis and degenerative changes in the thoracic spine with post kyphoplasty changes in the mid thoracic region.  IMPRESSION: Emphysematous changes.  Probable small left pleural effusion.   Electronically Signed   By: Lucienne Capers M.D.   On: 03/14/2015 00:13   Dg Ribs Unilateral  Left  03/14/2015   CLINICAL DATA:  Pain across the chest for 30 minutes.  EXAM: LEFT RIBS - 2 VIEW  COMPARISON:  Chest 03/13/2015  FINDINGS: Left ribs appear intact. No displaced fractures or focal bone lesions are identified.  IMPRESSION: No acute bony abnormalities.   Electronically Signed   By: Lucienne Capers M.D.   On: 03/14/2015 04:15   Dg Thoracic Spine 2 View  03/14/2015   CLINICAL DATA:  Pain across the chest for 30 minutes. Patient is nonverbal from previous stroke.  EXAM: THORACIC SPINE - 2-3 VIEWS  COMPARISON:  Chest 03/13/2015  FINDINGS: Thoracic scoliosis convex towards the right. Diffuse degenerative changes. Mid thoracic vertebral compression deformities post kyphoplasty. No anterior subluxation of the thoracic spine. There is evidence of mild anterior subluxation of C4 on C5. This is of uncertain chronicity. No prior comparison studies at this air found.  IMPRESSION: Thoracic kyphoscoliosis. Compression deformities at T8 and T9 post kyphoplasty. No acute thoracic fractures identified. There is anterior subluxation noted at C4-5 of nonspecific etiology.   Electronically Signed   By: Lucienne Capers M.D.   On: 03/14/2015 04:13   Dg Lumbar Spine 2-3 Views  03/14/2015   CLINICAL DATA:  Pain across the chest for 30 minutes.  Back pain.  EXAM: LUMBAR SPINE - 2-3 VIEW  COMPARISON:  CT abdomen and pelvis 03/10/2015  FINDINGS: Lumbar scoliosis convex towards the left. Diffuse degenerative changes with endplate hypertrophic changes throughout and mild disc space narrowing. Normal alignment of the lumbar spine. No vertebral compression deformities. No focal bone lesions identified. Surgical clips in the abdomen.  IMPRESSION: Lumbar scoliosis with degenerative changes. No acute displaced fractures identified.   Electronically Signed   By: Lucienne Capers M.D.   On: 03/14/2015 04:14   Dg Abd 1 View  03/14/2015   CLINICAL DATA:  Pain across the chest for 30 minutes.  Constipation.  EXAM: ABDOMEN - 1  VIEW  COMPARISON:  CT abdomen and pelvis 03/10/2015  FINDINGS: Residual contrast material in the left colon. No small or large bowel distention. Surgical clips in the right abdomen. Degenerative changes in the spine and hips. No radiopaque stones. Vascular calcifications.  IMPRESSION: Residual contrast material in the colon. No evidence of bowel obstruction.   Electronically Signed   By: Lucienne Capers M.D.   On: 03/14/2015 04:16   Ct Head Wo Contrast  03/14/2015   CLINICAL DATA:  81 year old  female with chest pain. Patient has history of prior CVA  EXAM: CT HEAD WITHOUT CONTRAST  TECHNIQUE: Contiguous axial images were obtained from the base of the skull through the vertex without intravenous contrast.  COMPARISON:  Head CT dated 02/18/2015 and MRI dated 02/26/2015  FINDINGS: The ventricles are dilated and the sulci are prominent compatible with age-related atrophy. Periventricular and deep white matter hypodensities represent chronic microvascular ischemic changes. Left frontoparietal as well as left occipital hypodensities correspond to the infarcts seen on the prior study. There is no intracranial hemorrhage. No mass effect or midline shift identified.  The visualized paranasal sinuses and mastoid air cells are well aerated. The calvarium is intact.  The rectum  IMPRESSION: No acute intracranial pathology.  Age-related atrophy and chronic microvascular ischemic disease. Progression of the left frontoparietal and left occipital infarcts with encephalomalacia changes in.  If symptoms persist and there are no contraindications, MRI may provide better evaluation if clinically indicated.   Electronically Signed   By: Anner Crete M.D.   On: 03/14/2015 00:29      Lab Results  Component Value Date   HGBA1C 5.9* 12/26/2014   Lab Results  Component Value Date   LDLCALC 52 12/26/2014   CREATININE 0.83 03/14/2015       Scheduled Meds: . apixaban  2.5 mg Oral BID  . cefTRIAXone (ROCEPHIN) IVPB 1  gram/50 mL D5W  1 g Intravenous Q24H  . ciprofloxacin  1 drop Both Eyes BID  . levothyroxine  50 mcg Oral QAC breakfast  . metoprolol tartrate  25 mg Oral BID  . rOPINIRole  0.25 mg Oral TID  . senna-docusate  2 tablet Oral Daily  . sertraline  50 mg Oral Daily  . sodium chloride  3 mL Intravenous Q12H   Continuous Infusions:   Active Problems:   Essential hypertension   Paroxysmal atrial fibrillation   Long-term (current) use of anticoagulants   Encephalopathy   UTI (lower urinary tract infection)   Altered mental state   Chest pain    Time spent: 45 minutes   [facility] Pager [phone]. If 7PM-7AM, please contact night-coverage at [email or web address], password [facility] 03/15/2015, 11:05 AM  LOS: 1 day

## 2015-03-15 NOTE — Evaluation (Signed)
Clinical/Bedside Swallow Evaluation Patient Details  Name: Linda [name] MRN: [id number] Date of Birth: 1929/02/21  Today's Date: 03/15/2015 Time: SLP Start Time (ACUTE ONLY): 1057 SLP Stop Time (ACUTE ONLY): 1105 SLP Time Calculation (min) (ACUTE ONLY): 8 min  Past Medical History:  Past Medical History  Diagnosis Date  . History of cardiac catheterization 2002    Negative  . Vertebral fracture   . Restless legs syndrome with nocturnal myoclonus 05/13/2013     requip controlled   . Abnormal uterine bleeding 3299,2426  . Hypothyroidism   . Arthritis   . Osteoporosis   . History of stomach ulcers   . History of jaundice as a child   . Anemia   . Head pain, chronic     "I have pain in the back of my head for months"  . Dizziness     TAKES MECLIZINE TO TREAT  . Hemiparesis and speech and language deficit as late effects of stroke 02/09/2015   Past Surgical History:  Past Surgical History  Procedure Laterality Date  . Back surgery      x 3  . Breast surgery Bilateral     1 lump each breast removed and benign  . Nasal sinus surgery    . Dilation and curettage of uterus  1970  . Catheterization  2003    cardiac cath  . Kyphoplasty  2010  . Cataract extraction Bilateral 2011  . Cholecystectomy  1995    Lap  . Cardiac catheterization  2002    "it was normal" per pt - no record found  . Robotic assisted salpingo oopherectomy Bilateral 11/02/2014    Procedure: ROBOTIC ASSISTED bilateral SALPINGO OOPHORECTOMY;  Surgeon: Everitt Amber, MD;  Location: [facility];  Service: Gynecology;  Laterality: Bilateral;  . Laparoscopy N/A 11/02/2014    Procedure: LAPAROSCOPY DIAGNOSTIC;  Surgeon: Everitt Amber, MD;  Location: [facility];  Service: Gynecology;  Laterality: N/A;  . Abdominal hysterectomy     HPI:  81 year old female history of atrial fibrillation on chronic anticoagulation recently treated for urinary tract infection presents with inability to get up secondary to back pain. Evidence of mild  persistent UTI. Pt has a history of dyspahgia from left CVA, but has been upgraded to regular/thin from nectar, and was evaluate by acute SLP last admit in June 2016 with no findings of residual dysphagia.    Assessment / Plan / Recommendation Clinical Impression  Pt demonstrates mild persistent right facial and lingual weakness from left CVA, but no observable impact on swallowing. Pt may continue a regular diet and thin liquids. No SLP f/u needed in acute setting    Aspiration Risk  Mild    Diet Recommendation Age appropriate regular solids;Thin   Medication Administration: Whole meds with liquid    Other  Recommendations Oral Care Recommendations: Oral care BID   Follow Up Recommendations       Frequency and Duration        Pertinent Vitals/Pain NA    SLP Swallow Goals     Swallow Study Prior Functional Status       General Date of Onset: 03/14/15 Other Pertinent Information: 81 year old female history of atrial fibrillation on chronic anticoagulation recently treated for urinary tract infection presents with inability to get up secondary to back pain. Evidence of mild persistent UTI. Pt has a history of dyspahgia from left CVA, but has been upgraded to regular/thin from nectar, and was evaluate by acute SLP last admit in June 2016 with no findings of  residual dysphagia.  Type of Study: Bedside swallow evaluation Previous Swallow Assessment: see HPI Diet Prior to this Study: Regular;Thin liquids Temperature Spikes Noted: No Respiratory Status: Room air History of Recent Intubation: No Behavior/Cognition: Alert;Cooperative;Requires cueing Oral Cavity - Dentition: Adequate natural dentition/normal for age Self-Feeding Abilities: Able to feed self Patient Positioning: Upright in bed Baseline Vocal Quality: Normal Volitional Cough: Cognitively unable to elicit Volitional Swallow: Unable to elicit    Oral/Motor/Sensory Function Overall Oral Motor/Sensory Function:  Impaired Labial ROM: Reduced right Labial Symmetry: Abnormal symmetry right Labial Strength: Reduced Labial Sensation: Reduced Lingual ROM: Reduced right Lingual Symmetry: Abnormal symmetry right Lingual Strength: Reduced Lingual Sensation: Within Functional Limits Facial ROM: Reduced right Facial Symmetry: Right droop Facial Strength: Reduced Facial Sensation: Within Functional Limits Velum: Within Functional Limits Mandible: Within Functional Limits   Ice Chips Ice chips: Not tested   Thin Liquid Thin Liquid: Within functional limits Presentation: Cup;Straw;Self Fed    Nectar Thick Nectar Thick Liquid: Not tested   Honey Thick Honey Thick Liquid: Not tested   Puree Puree: Within functional limits   Solid   GO    Solid: Within functional limits Presentation: [place] Adriena Manfre, MA CCC-SLP [phone]  Lynann Beaver 03/15/2015,11:08 AM

## 2015-03-15 NOTE — Progress Notes (Signed)
Rehab Admissions Coordinator Note:  Patient was screened by Cleatrice Burke for appropriateness for an Inpatient Acute Rehab Consult per PT recommendation.   At this time, we are recommending Inpatient Rehab consult if pt and family would like to consider an inpt rehab admission prior to d/c home. Please place order.  Cleatrice Burke 03/15/2015, 7:55 PM  I can be reached at [phone].

## 2015-03-16 DIAGNOSIS — I69398 Other sequelae of cerebral infarction: Secondary | ICD-10-CM

## 2015-03-16 DIAGNOSIS — I480 Paroxysmal atrial fibrillation: Secondary | ICD-10-CM

## 2015-03-16 DIAGNOSIS — I69320 Aphasia following cerebral infarction: Secondary | ICD-10-CM

## 2015-03-16 DIAGNOSIS — R079 Chest pain, unspecified: Secondary | ICD-10-CM

## 2015-03-16 DIAGNOSIS — R269 Unspecified abnormalities of gait and mobility: Secondary | ICD-10-CM

## 2015-03-16 DIAGNOSIS — R482 Apraxia: Secondary | ICD-10-CM

## 2015-03-16 DIAGNOSIS — Z7901 Long term (current) use of anticoagulants: Secondary | ICD-10-CM

## 2015-03-16 LAB — BASIC METABOLIC PANEL
ANION GAP: 4 — AB (ref 5–15)
BUN: 20 mg/dL (ref 6–20)
CALCIUM: 8.2 mg/dL — AB (ref 8.9–10.3)
CO2: 28 mmol/L (ref 22–32)
CREATININE: 0.89 mg/dL (ref 0.44–1.00)
Chloride: 103 mmol/L (ref 101–111)
GFR calc Af Amer: >60 mL/min (ref >60)
GFR, EST NON AFRICAN AMERICAN: 58 mL/min — AB (ref >60)
Glucose, Bld: 115 mg/dL — ABNORMAL HIGH (ref 65–99)
POTASSIUM: 3.9 mmol/L (ref 3.5–5.1)
Sodium: 135 mmol/L (ref 135–145)

## 2015-03-16 LAB — CBC
HCT: 37.4 % (ref 36.0–46.0)
Hemoglobin: 12.5 g/dL (ref 12.0–15.0)
MCH: 30.9 pg (ref 26.0–34.0)
MCHC: 33.4 g/dL (ref 30.0–36.0)
MCV: 92.6 fL (ref 78.0–100.0)
PLATELETS: 255 10*3/uL (ref 150–400)
RBC: 4.04 MIL/uL (ref 3.87–5.11)
RDW: 13 % (ref 11.5–15.5)
WBC: 8.3 10*3/uL (ref 4.0–10.5)

## 2015-03-16 MED ORDER — TOBRAMYCIN 0.3 % OP OINT
TOPICAL_OINTMENT | Freq: Four times a day (QID) | OPHTHALMIC | Status: DC
  Administered 2015-03-16 (×2): 1 via OPHTHALMIC
  Administered 2015-03-16 – 2015-03-17 (×3): via OPHTHALMIC
  Filled 2015-03-16: qty 3.5

## 2015-03-16 MED ORDER — METHOCARBAMOL 500 MG PO TABS
500.0000 mg | ORAL_TABLET | Freq: Three times a day (TID) | ORAL | Status: DC
  Administered 2015-03-16 – 2015-03-17 (×5): 500 mg via ORAL
  Filled 2015-03-16 (×5): qty 1

## 2015-03-16 MED ORDER — HYPROMELLOSE (GONIOSCOPIC) 2.5 % OP SOLN: 1.0000 [drp] | Freq: Three times a day (TID) | OPHTHALMIC | Status: DC | PRN

## 2015-03-16 MED ORDER — KETOROLAC TROMETHAMINE 15 MG/ML IJ SOLN
15.0000 mg | Freq: Once | INTRAMUSCULAR | Status: AC
  Administered 2015-03-16: 15 mg via INTRAVENOUS
  Filled 2015-03-16: qty 1

## 2015-03-16 MED ORDER — POLYVINYL ALCOHOL 1.4 % OP SOLN
1.0000 [drp] | Freq: Three times a day (TID) | OPHTHALMIC | Status: DC | PRN
  Filled 2015-03-16: qty 15

## 2015-03-16 NOTE — Progress Notes (Signed)
[facility]                                                                              Patient Demographics  Linda [name], is a 81 y.o. female, DOB - 12/01/1928, NAT:[id number]  Admit date - 03/13/2015   Admitting Physician Toy Baker, MD  Outpatient Primary MD for the patient is [name], MD  LOS - 2   Chief Complaint  Patient presents with  . Chest Pain       Brief HPI   Linda [name] is a 81 y.o. female  has a past medical history of History of cardiac catheterization (2002); Vertebral fracture; Restless legs syndrome with nocturnal myoclonus (05/13/2013); Abnormal uterine bleeding (4270,6237); Hypothyroidism; Arthritis; Osteoporosis; History of stomach ulcers; History of jaundice as a child; Anemia; Head pain, chronic; Dizziness; and Hemiparesis and speech and language deficit as late effects of stroke (02/09/2015).  Presented with patient was seen and evaluated on the seventh with abdominal pain at that point she reported no bowel movement for 3 days and lower abdominal pain. CT scan of the abdomen was not attached time the only finding was slightly swirled appearance mesentery in the right mid up abdomen thought to be secondary to prior surgery. 2 months ago patient has undergone hysterectomy for right ovarian mass which was found to be right ovarian fibroma. Patient was found to have urinary tract infection and was discharged home on Keflex. Unfortunately urine culture was not obtained at that time.  Patient has refused to get up for the past 3 days from her bed and family brought her in. Patient was also endorsing chest pain lasting 30 minutes. History was related through the family as patient is nonverbal secondary to prior stroke April 2016.  Patient has been nonambulatory for the past few days.of note patient has had a negative cardiac catheterization 2002 Family reports no BM for the week or more. It is difficult to discern if  patient is confused since patient is aphasic. She does endorse having thoracic as well as lower lumbar pain that was sudden in onset and reports that that is a cause for her not being able to get up. Hospitalist was called for admission for UTI and confusion   Assessment & Plan   Principal problem Acute encephalopathy likely due to UTI, also has chronic aphasia - Unclear baseline, patient also has chronic aphasia status post her stroke appears to understand questions - Treat UTI   Atypical Chest pain - Troponins negative, ruled out for acute ACS - negative rib imaging, x-rays of the thoracic spine show kyphoscoliosis, compression deformity at T8 and T9, status post kyphoplasty, no acute thoracic fractures, lumbar scoliosis with degenerative changes.  - PT OT evaluation recommending CIR  UTI - Urine cultures so far negative, continue IV Rocephin  Recurrent CVA with residual right-sided weakness, chronic aphasia - cont eliquis    Essential hypertension  - continue home medications   Paroxysmal atrial fibrillation - Currently rate controlled, continue metoprolol and eliquis - CHADS vasc 4  Constipation - Continue bowel regimen   Conjunctivitis - Changed to tobramycin eyedrops  Code Status: dnr  Family  Communication: Discussed in detail with the patient, all imaging results, lab results explained to the patient    Disposition Plan: awaiting CIR  Time Spent in minutes 25 minutes  Procedures  None   Consults   Inpatient rehabilitation  DVT Prophylaxis eliquis  Medications  Scheduled Meds: . apixaban  2.5 mg Oral BID  . cefTRIAXone (ROCEPHIN) IVPB 1 gram/50 mL D5W  1 g Intravenous Q24H  . ciprofloxacin  1 drop Both Eyes BID  . levothyroxine  50 mcg Oral QAC breakfast  . methocarbamol  500 mg Oral TID  . metoprolol tartrate  25 mg Oral BID  . rOPINIRole  0.25 mg Oral TID  . senna-docusate  2 tablet Oral Daily  . sertraline  50 mg Oral Daily  . sodium  chloride  3 mL Intravenous Q12H   Continuous Infusions:  PRN Meds:.acetaminophen **OR** acetaminophen, docusate sodium, fentaNYL (SUBLIMAZE) injection, ondansetron **OR** ondansetron (ZOFRAN) IV, pantoprazole sodium, polyvinyl alcohol, traMADol   Antibiotics   Anti-infectives    Start     Dose/Rate Route Frequency Ordered Stop   03/14/15 0630  cefTRIAXone (ROCEPHIN) 1 g in dextrose 5 % 50 mL IVPB     1 g 100 mL/hr over 30 Minutes Intravenous Every 24 hours 03/14/15 3299          Subjective:   Linda [name] was seen and examined today.  Somewhat stiff, continued to complain of pain in the back left chest, flank, denied any shortness of breath, nausea, vomiting. Chronic aphasia with residual right-sided weakness.  Objective:   Blood pressure 108/54, pulse 78, temperature 98.3 F (36.8 C), temperature source Oral, resp. rate 16, height 5\' 3"  (1.6 m), weight 45.632 kg (100 lb 9.6 oz), last menstrual period 09/03/1996, SpO2 97 %.  Wt Readings from Last 3 Encounters:  03/16/15 45.632 kg (100 lb 9.6 oz)  02/25/15 41.277 kg (91 lb)  02/19/15 41.3 kg (91 lb 0.8 oz)     Intake/Output Summary (Last 24 hours) at 03/16/15 1252 Last data filed at 03/15/15 2200  Gross per 24 hour  Intake      3 ml  Output    350 ml  Net   -347 ml    Exam  General: Alert aphasia, NAD  HEENT:  PERRLA, EOMI, Anicteric Sclera, mucous membranes moist.   Neck: Supple, no JVD, no masses  CVS: S1 S2 auscultated, no rubs, murmurs or gallops. Regular rate and rhythm.  Respiratory: Clear to auscultation bilaterally, no wheezing, rales or rhonchi  Abdomen: Soft, nontender, nondistended, + bowel sounds  Ext: no cyanosis clubbing or edema  Neuro: chronic aphasia with residual right-sided weakness  Skin: No rashes  Psych: Aphasia   Data Review   Micro Results Recent Results (from the past 240 hour(s))  Urine culture     Status: None   Collection Time: 03/14/15 12:35 AM  Result Value Ref  Range Status   Specimen Description URINE, CATHETERIZED  Final   Special Requests NONE  Final   Culture NO GROWTH 1 DAY  Final   Report Status 03/15/2015 FINAL  Final    Radiology Reports Dg Chest 2 View  03/14/2015   CLINICAL DATA:  Mid chest pain and dizziness.  EXAM: CHEST  2 VIEW  COMPARISON:  02/25/2015  FINDINGS: Hyperinflation consistent with emphysema. Normal heart size and pulmonary vascularity. Calcified granulomas in the right lung. Opacification of the left lung base posteriorly probably represents small pleural effusion. No focal consolidation in the lungs. No pneumothorax. Calcified and tortuous aorta.  Scoliosis and degenerative changes in the thoracic spine with post kyphoplasty changes in the mid thoracic region.  IMPRESSION: Emphysematous changes.  Probable small left pleural effusion.   Electronically Signed   By: Lucienne Capers M.D.   On: 03/14/2015 00:13   Dg Chest 2 View  02/26/2015   CLINICAL DATA:  Right hand numbness  EXAM: CHEST  2 VIEW  COMPARISON:  12/25/2014  FINDINGS: Chronic interstitial markings/ emphysematous changes. Small left pleural effusion, improved. No pneumothorax.  Cardiomegaly.  Degenerative changes of the visualized thoracolumbar spine. Mid/lower thoracic vertebral augmentation.  IMPRESSION: Small left pleural effusion, improved.   Electronically Signed   By: Julian Hy M.D.   On: 02/26/2015 00:57   Dg Ribs Unilateral Left  03/14/2015   CLINICAL DATA:  Pain across the chest for 30 minutes.  EXAM: LEFT RIBS - 2 VIEW  COMPARISON:  Chest 03/13/2015  FINDINGS: Left ribs appear intact. No displaced fractures or focal bone lesions are identified.  IMPRESSION: No acute bony abnormalities.   Electronically Signed   By: Lucienne Capers M.D.   On: 03/14/2015 04:15   Dg Thoracic Spine 2 View  03/14/2015   CLINICAL DATA:  Pain across the chest for 30 minutes. Patient is nonverbal from previous stroke.  EXAM: THORACIC SPINE - 2-3 VIEWS  COMPARISON:  Chest  03/13/2015  FINDINGS: Thoracic scoliosis convex towards the right. Diffuse degenerative changes. Mid thoracic vertebral compression deformities post kyphoplasty. No anterior subluxation of the thoracic spine. There is evidence of mild anterior subluxation of C4 on C5. This is of uncertain chronicity. No prior comparison studies at this air found.  IMPRESSION: Thoracic kyphoscoliosis. Compression deformities at T8 and T9 post kyphoplasty. No acute thoracic fractures identified. There is anterior subluxation noted at C4-5 of nonspecific etiology.   Electronically Signed   By: Lucienne Capers M.D.   On: 03/14/2015 04:13   Dg Lumbar Spine 2-3 Views  03/14/2015   CLINICAL DATA:  Pain across the chest for 30 minutes.  Back pain.  EXAM: LUMBAR SPINE - 2-3 VIEW  COMPARISON:  CT abdomen and pelvis 03/10/2015  FINDINGS: Lumbar scoliosis convex towards the left. Diffuse degenerative changes with endplate hypertrophic changes throughout and mild disc space narrowing. Normal alignment of the lumbar spine. No vertebral compression deformities. No focal bone lesions identified. Surgical clips in the abdomen.  IMPRESSION: Lumbar scoliosis with degenerative changes. No acute displaced fractures identified.   Electronically Signed   By: Lucienne Capers M.D.   On: 03/14/2015 04:14   Dg Abd 1 View  03/14/2015   CLINICAL DATA:  Pain across the chest for 30 minutes.  Constipation.  EXAM: ABDOMEN - 1 VIEW  COMPARISON:  CT abdomen and pelvis 03/10/2015  FINDINGS: Residual contrast material in the left colon. No small or large bowel distention. Surgical clips in the right abdomen. Degenerative changes in the spine and hips. No radiopaque stones. Vascular calcifications.  IMPRESSION: Residual contrast material in the colon. No evidence of bowel obstruction.   Electronically Signed   By: Lucienne Capers M.D.   On: 03/14/2015 04:16   Ct Head Wo Contrast  03/14/2015   CLINICAL DATA:  81 year old female with chest pain. Patient has  history of prior CVA  EXAM: CT HEAD WITHOUT CONTRAST  TECHNIQUE: Contiguous axial images were obtained from the base of the skull through the vertex without intravenous contrast.  COMPARISON:  Head CT dated 02/18/2015 and MRI dated 02/26/2015  FINDINGS: The ventricles are dilated and the sulci are prominent compatible  with age-related atrophy. Periventricular and deep white matter hypodensities represent chronic microvascular ischemic changes. Left frontoparietal as well as left occipital hypodensities correspond to the infarcts seen on the prior study. There is no intracranial hemorrhage. No mass effect or midline shift identified.  The visualized paranasal sinuses and mastoid air cells are well aerated. The calvarium is intact.  The rectum  IMPRESSION: No acute intracranial pathology.  Age-related atrophy and chronic microvascular ischemic disease. Progression of the left frontoparietal and left occipital infarcts with encephalomalacia changes in.  If symptoms persist and there are no contraindications, MRI may provide better evaluation if clinically indicated.   Electronically Signed   By: Anner Crete M.D.   On: 03/14/2015 00:29   Ct Head (brain) Wo Contrast  02/25/2015   CLINICAL DATA:  Acute onset of right forearm and hand numbness and tingling. Underlying symptoms from chronic infarct. Initial encounter.  EXAM: CT HEAD WITHOUT CONTRAST  TECHNIQUE: Contiguous axial images were obtained from the base of the skull through the vertex without intravenous contrast.  COMPARISON:  CT of the head performed 02/18/2015, and MRI of the brain performed 02/19/2015  FINDINGS: Evolving subacute to chronic infarcts are again noted at the MCA territories bilaterally, larger on the left, and also at the left PCA territory, along the left occipital lobe. These are similar in appearance to prior studies.  Underlying moderate cortical volume loss is noted. Mild cerebellar atrophy is seen. Scattered periventricular white  matter change likely reflects small vessel ischemic microangiopathy.  The brainstem and fourth ventricle are within normal limits. No midline shift is seen.  There is no evidence of fracture; visualized osseous structures are unremarkable in appearance. The orbits are within normal limits. The paranasal sinuses and mastoid air cells are well-aerated. Trace air lateral to the right orbit and along the right cavernous sinus likely reflects air from an IV catheter.  IMPRESSION: 1. No acute intracranial pathology seen to explain new symptoms. 2. Evolving subacute to chronic infarcts at the MCA territories bilaterally, larger on the left, and also the left PCA territory, along the left occipital lobe. These are similar in appearance to prior studies. 3. Underlying moderate cortical volume loss noted. Scattered small vessel ischemic microangiopathy seen.   Electronically Signed   By: Garald Balding M.D.   On: 02/25/2015 23:27   Ct Head Wo Contrast  02/18/2015   CLINICAL DATA:  Right hand numbness and tingling ; facial and bilateral lower extremity numbness  EXAM: CT HEAD WITHOUT CONTRAST  TECHNIQUE: Contiguous axial images were obtained from the base of the skull through the vertex without intravenous contrast.  COMPARISON:  December 25, 2014 head CT and brain MRI  FINDINGS: There is moderate diffuse atrophy. There is no intracranial mass, hemorrhage, extra-axial fluid collection, or midline shift. There is evidence of an acute infarct in the inferior, medial left occipital lobe with cytotoxic edema in this area. There is evidence of a prior infarct in the left superior temporal lobe extending to the left temporal -occipital junction. There is an infarct in the posterior right frontal lobe which appears slightly larger than on recent prior studies. There is patchy small vessel disease in the centra semiovale bilaterally.  Bony calvarium appears intact. There is patchy mastoid disease bilaterally.  IMPRESSION: Findings  consistent with an acute infarct in the left inferior, medial occipital lobe. Prior left temporal and posterior right frontal lobe infarcts. The posterior right frontal infarct may have extended since recent studies and may have an acute component  superimposed on chronic change. Atrophy with mild small vessel disease. No hemorrhage or mass effect. Patchy ethmoid sinus disease bilaterally.  These results were called by telephone at the time of interpretation on 02/18/2015 at 10:07 pm to Dr. Elnora Morrison , who verbally acknowledged these results.   Electronically Signed   By: Lowella Grip III M.D.   On: 02/18/2015 22:08   Mr Brain Wo Contrast  02/26/2015   CLINICAL DATA:  RIGHT upper extremity numbness beginning at 8:15 tonight. History of TIA 1 week ago, prior stroke with residual RIGHT-sided deficits. History of headache, dizziness.  EXAM: MRI HEAD WITHOUT CONTRAST  TECHNIQUE: Multiplanar, multiecho pulse sequences of the brain and surrounding structures were obtained without intravenous contrast.  COMPARISON:  CT head February 25, 2015 at 2321 hours and MRI of the head February 19, 2015  FINDINGS: Faint reduced diffusion within the LEFT greater the RIGHT frontal lobes, LEFT occipital lobe evolving from prior examination with normalized ADC values. Associated FLAIR hyperintense signal, and minimal T1 shortening consistent with mineralization. No reduced diffusion to suggest acute ischemia. No susceptibility artifact to suggest hemorrhage. No midline shift, mass effect or mass lesions. Moderate to severe ventriculomegaly, on the basis of global parenchymal brain volume loss as there is overall commensurate enlargement of cerebral sulci and cerebellar folia. Patchy white matter changes exclusive of the aforementioned abnormality consistent with mild to moderate chronic small vessel ischemic disease.  No abnormal extra-axial fluid collections. Normal major intracranial vascular flow voids seen at the skull base. Status  post bilateral ocular lens implants. Trace ethmoid mucosal thickening, mastoid air cells are well aerated. No abnormal sellar expansion. No suspicious calvarial bone marrow signal. No cerebellar tonsillar ectopia.  IMPRESSION: No acute ischemia.  Early chronic bifrontal (MCA territory), LEFT occipital lobe (PCA territory) infarcts.  Involutional changes. Mild to moderate white matter changes compatible with chronic small vessel ischemic disease.   Electronically Signed   By: Elon Alas M.D.   On: 02/26/2015 01:20   Mr Brain Wo Contrast  02/19/2015   CLINICAL DATA:  81 year old female with history of recent stroke secondary to AFib with resultant right-sided weakness and expressive aphasia, now presenting with increased right upper extremity weakness and sensory deficits.  EXAM: MRI HEAD WITHOUT CONTRAST  TECHNIQUE: Multiplanar, multiecho pulse sequences of the brain and surrounding structures were obtained without intravenous contrast.  COMPARISON:  Prior CT from earlier the same day as well as previous MRI from 12/25/2014  FINDINGS: There has been interval evolution of recently identified left MCA territory infarct involving the left insular cortex and operculum. This area of infarction now demonstrates encephalomalacia with associated laminar necrosis. There is persistent DWI signal abnormality about the margins of this infarction. In addition, there has been interval evolution of a right MCA territory infarct involving the anterior right frontal lobe which now demonstrates increased encephalomalacia as compared to the previous study. There is persistent DWI signal abnormality around this area of infarction is well. Both of these infarcts were present on the previous MRI from 12/25/2014.  There is a new ischemic infarct involving the left occipital lobe and the left PCA territory (series 4, image 20). This appears to be subacute in nature with fairly heterogeneous DWI signal intensity and normalized ADC  signal, with associated early laminar necrosis and encephalomalacia.  No other infarct identified. No acute or chronic intracranial hemorrhage. Normal intravascular flow voids are preserved.  No mass lesion, mass effect, or midline shift.  Generalized cerebral atrophy with mild chronic microvascular  ischemic disease noted. Ventricular prominence is stable from previous study, likely related to global parenchymal volume loss. No hydrocephalus. No extra-axial fluid collection.  Craniocervical junction within normal limits. Visualized upper cervical spine demonstrates no acute abnormality. Pituitary gland normal. No acute abnormality about the orbits.  Mild mucosal thickening present within the ethmoidal air cells. Minimal fluid opacity present within the left mastoid air cells posteriorly. Inner ear structures normal.  Bone marrow signal intensity within normal limits. Scalp soft tissues within normal limits.  IMPRESSION: 1. Findings consistent with subacute ischemic left PCA territory infarct involving the left occipital lobe. 2. Interval evolution of late subacute to chronic bilateral MCA territory infarcts involving both the left and right frontal lobes as above. No acute component identified about these infarcts. 3. Moderate generalized cerebral atrophy.   Electronically Signed   By: Jeannine Boga M.D.   On: 02/19/2015 01:21   Ct Abdomen Pelvis W Contrast  03/10/2015   CLINICAL DATA:  No bowel movement for 3 days, lower abdominal pain, hysterectomy 2 months ago, history of ulcer disease, stroke  EXAM: CT ABDOMEN AND PELVIS WITH CONTRAST  TECHNIQUE: Multidetector CT imaging of the abdomen and pelvis was performed using the standard protocol following bolus administration of intravenous contrast. Sagittal and coronal MPR images reconstructed from axial data set.  CONTRAST:  20mL OMNIPAQUE IOHEXOL 300 MG/ML SOLN IV. Dilute oral contrast.  COMPARISON:  10/19/2013  FINDINGS: Dependent atelectasis at both lung  bases with small LEFT pleural effusion.  Scattered atherosclerotic calcifications without aneurysm.  Tiny BILATERAL renal cysts.  Single small calcification within pancreatic tail.  Liver, spleen, pancreas, kidneys, and adrenal glands otherwise normal.  Gallbladder surgically absent.  History indicates prior hysterectomy but soft tissue identified cranial to the vagina question retained cervix versus atrophic uterus.  Neither ovary visualized.  Appendix not definitely identified.  Slightly swirled appearance of mesentery on coronal images in the RIGHT mid abdomen/upper RIGHT pelvis, where a surgical clip is identified, question related to prior surgery or internal hernia ; however, no evidence of closed-loop obstruction or bowel dilatation/obstruction identified.  Stomach collapsed with prominence of the antral wall, question wall thickening versus artifact from underdistention.  Sigmoid diverticulosis without definite evidence of diverticulitis.  Remaining bowel loops normal appearance.  No mass, adenopathy, free fluid or free air.  Bones diffusely demineralized.  IMPRESSION: No definite acute intra-abdominal or intrapelvic process as above.  Unable to exclude gastric antral wall thickening though this may be an artifact related to underdistention.  Slightly swirled appearance of the mesentery in RIGHT mid abdomen on coronal images could be related to prior surgery as discussed above, without evidence of bowel obstruction.  Sigmoid diverticulosis.   Electronically Signed   By: Lavonia Dana M.D.   On: 03/10/2015 17:27    CBC  Recent Labs Lab 03/10/15 1635 03/13/15 2222 03/14/15 0642 03/15/15 0420 03/16/15 0654  WBC 9.7 11.8* 9.8 8.9 8.3  HGB 14.7 14.5 12.3 13.5 12.5  HCT 45.0 44.3 36.4 39.4 37.4  PLT 232 291 225 266 255  MCV 93.6 94.5 92.2 92.7 92.6  MCH 30.6 30.9 31.1 31.8 30.9  MCHC 32.7 32.7 33.8 34.3 33.4  RDW 13.2 13.2 13.1 13.2 13.0  LYMPHSABS 1.4  --   --   --   --   MONOABS 1.1*  --   --    --   --   EOSABS 0.1  --   --   --   --   BASOSABS 0.0  --   --   --   --  Chemistries   Recent Labs Lab 03/10/15 1635 03/13/15 2222 03/14/15 0642 03/16/15 0654  NA 136 134* 135 135  K 4.0 3.9 3.7 3.9  CL 100* 99* 104 103  CO2 29 27 25 28   GLUCOSE 94 137* 112* 115*  BUN 23* 21* 21* 20  CREATININE 1.04* 1.16* 0.83 0.89  CALCIUM 9.9 9.2 8.0* 8.2*  MG  --   --  1.9  --   AST 23  --  19  --   ALT 21  --  17  --   ALKPHOS 82  --  76  --   BILITOT 1.4*  --  0.7  --    ------------------------------------------------------------------------------------------------------------------ estimated creatinine clearance is 33.9 mL/min (by C-G formula based on Cr of 0.89). ------------------------------------------------------------------------------------------------------------------ No results for input(s): HGBA1C in the last 72 hours. ------------------------------------------------------------------------------------------------------------------ No results for input(s): CHOL, HDL, LDLCALC, TRIG, CHOLHDL, LDLDIRECT in the last 72 hours. ------------------------------------------------------------------------------------------------------------------  Recent Labs  03/13/15 2340  TSH 5.296*   ------------------------------------------------------------------------------------------------------------------ No results for input(s): VITAMINB12, FOLATE, FERRITIN, TIBC, IRON, RETICCTPCT in the last 72 hours.  Coagulation profile  Recent Labs Lab 03/13/15 2340  INR 1.95*    No results for input(s): DDIMER in the last 72 hours.  Cardiac Enzymes  Recent Labs Lab 03/14/15 0642 03/14/15 1103  TROPONINI <0.03 <0.03   ------------------------------------------------------------------------------------------------------------------ Invalid input(s): POCBNP  No results for input(s): GLUCAP in the last 72 hours.   Cherita Hebel M.D. [facility] 03/16/2015, 12:52  PM  Pager: [phone] Between 7am to 7pm - call Pager - [phone]  After 7pm go to [email or web address] - password [email or web address]  Call night coverage person covering after 7pm

## 2015-03-16 NOTE — Progress Notes (Signed)
Rehab admissions - Evaluated for possible admission.  I spoke briefly with patient, but she is aphasic and cannot converse well with me.  I have called her son and have left a message with him.  I will discuss rehab options with son and follow up in am.  Call me for questions.  #[phone]

## 2015-03-16 NOTE — Consult Note (Signed)
Physical Medicine and Rehabilitation Consult   Reason for Consult: Weakness  Referring Physician:  Dr. Tana Coast.    HPI: Linda [name] is a 81 y.o. 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   She was readmitted on 03/14/15 with malaise and confusion due to recurrent UTI. She was started on IV rocephin for treatment with improvement. ST evaluation without evidence of dysphagia. PT evaluation done yesterday and CIR recommended for follow up therapy.   Patient is aphasic and unable to voice a complaint.  Review of Systems  Unable to perform ROS: language  Eyes: Positive for pain, discharge and redness.  Respiratory: Negative for shortness of breath.   Cardiovascular: Negative for chest pain.  Musculoskeletal: Positive for back pain, joint pain and neck pain.  Psychiatric/Behavioral: The patient is nervous/anxious.       Past Medical History  Diagnosis Date  . History of cardiac catheterization 2002    Negative  . Vertebral fracture   . Restless legs syndrome with nocturnal myoclonus 05/13/2013     [place] controlled   . Abnormal uterine bleeding 5009,3818  . Hypothyroidism   . Arthritis   . Osteoporosis   . History of stomach ulcers   . History of jaundice as a child   . Anemia   . Head pain, chronic     "I have pain in the back of my head for months"  . Dizziness     TAKES MECLIZINE TO TREAT  . Hemiparesis and speech and language deficit as late effects of stroke 02/09/2015    Past Surgical History  Procedure Laterality Date  . Back surgery      x 3  . Breast surgery Bilateral     1 lump each breast removed and benign  . Nasal sinus surgery    . Dilation and curettage of uterus  1970  . Catheterization  2003    cardiac cath  . Kyphoplasty  2010  . Cataract extraction Bilateral 2011  .  Cholecystectomy  1995    Lap  . Cardiac catheterization  2002    "it was normal" per pt - no record found  . Robotic assisted salpingo oopherectomy Bilateral 11/02/2014    Procedure: ROBOTIC ASSISTED bilateral SALPINGO OOPHORECTOMY;  Surgeon: Everitt Amber, MD;  Location: [facility];  Service: Gynecology;  Laterality: Bilateral;  . Laparoscopy N/A 11/02/2014    Procedure: LAPAROSCOPY DIAGNOSTIC;  Surgeon: Everitt Amber, MD;  Location: [facility];  Service: Gynecology;  Laterality: N/A;  . Abdominal hysterectomy      Family History  Problem Relation Age of Onset  . Asthma Brother   . Osteoarthritis Son   . Hypertension Father   . Stroke Father   . Cancer Brother     stomach     Social History: Was independent prior to 12/2014. Lives with son who provides supervision. Able to ambulate with walker PTA? Per  reports that she has never smoked. She has never used smokeless tobacco. Per reports that she does not drink alcohol or use illicit drugs.   Allergies  Allergen Reactions  . Ibuprofen Anaphylaxis    Passed out and had to be hospitalized  . Penicillins     Skin on head was crawling and very very sleepy  . Demerol [Meperidine Hcl] Nausea And Vomiting  . Dilaudid [Hydromorphone Hcl]  Nausea And Vomiting  . Fentanyl Other (See Comments)    unknown  . Lidoderm [Lidocaine] Other (See Comments)    unknown  . Lyrica [Pregabalin]     "made me a zombie'  . Morphine And Related Nausea And Vomiting  . Other     hamburger  . Percocet [Oxycodone-Acetaminophen] Nausea And Vomiting  . Tramadol Other (See Comments)    unknown  . Vicodin [Hydrocodone-Acetaminophen] Nausea And Vomiting    Medications Prior to Admission  Medication Sig Dispense Refill  . acetaminophen (TYLENOL) 500 MG tablet Take 1,000 mg by mouth every 6 (six) hours as needed for mild pain. Usually only takes 2 x a  day    . apixaban (ELIQUIS) 5 MG TABS tablet Take 1 tablet (5 mg total) by mouth 2 (two) times daily. Please discontinue  Aspirin once started on Eliquis! 60 tablet 3  . calcium gluconate 500 MG tablet Take 500 mg by mouth 2 (two) times daily. With 1000 units D3    . cephALEXin (KEFLEX) 500 MG capsule Take 1 capsule (500 mg total) by mouth 3 (three) times daily. 21 capsule 0  . cholecalciferol (VITAMIN D) 1000 UNITS tablet Take 1,000 Units by mouth 2 (two) times daily. Take with calium    . Cranberry 200 MG CAPS Take 1 capsule by mouth daily with lunch.     . denosumab (PROLIA) 60 MG/ML SOLN injection Inject 60 mg into the skin every 6 (six) months. Administer in upper arm, thigh, or abdomen    . docusate sodium (COLACE) 100 MG capsule Take 100 mg by mouth 2 (two) times daily as needed for mild constipation or moderate constipation.    . Flaxseed, Linseed, (FLAX SEED OIL) 1000 MG CAPS Take 1 capsule by mouth 2 (two) times daily.     . hyoscyamine (ANASPAZ) 0.125 MG TBDP disintergrating tablet Place 0.125 mg under the tongue 2 (two) times daily as needed for bladder spasms.    Marland Kitchen levothyroxine (SYNTHROID, LEVOTHROID) 50 MCG tablet Take 50 mcg by mouth every morning.     . meclizine (ANTIVERT) 25 MG tablet Take 25 mg by mouth daily as needed for dizziness.     . metoprolol tartrate (LOPRESSOR) 25 MG tablet Take 25 mg by mouth 2 (two) times daily.    . Multiple Vitamins-Minerals (CENTRUM SILVER PO) Take 1 tablet by mouth daily with lunch.     . ondansetron (ZOFRAN) 4 MG tablet Take 1 tablet (4 mg total) by mouth every 8 (eight) hours as needed for nausea or vomiting. 20 tablet 0  . pantoprazole sodium (PROTONIX) 40 mg/20 mL PACK Take 20 mLs (40 mg total) by mouth daily. (Patient taking differently: Take 40 mg by mouth daily as needed (acid reflux, heartburn). ) 30 each 4  . Propylene Glycol (SYSTANE BALANCE OP) Apply 1 drop to eye 2 (two) times daily.    Marland Kitchen rOPINIRole (REQUIP) 0.25 MG tablet TAKE 1 TABLET BY MOUTH 3 TIMES A DAY 270 tablet 2  . sennosides-docusate sodium (SENOKOT-S) 8.6-50 MG tablet Take 2 tablets by mouth  daily.    . sertraline (ZOLOFT) 50 MG tablet Take 50 mg by mouth daily.    . traMADol (ULTRAM) 50 MG tablet Take 50 mg by mouth every 6 (six) hours as needed for severe pain.      Home: Home Living Family/patient expects to be discharged to:: Private residence Living Arrangements: Children Available Help at Discharge: Family, Friend(s), Available PRN/intermittently Type of Home: House Home Access: Stairs to enter  Entrance Stairs-Number of Steps: 4 Entrance Stairs-Rails: Left Home Layout: Two level, Laundry or work area in basement, Able to live on main level with bedroom/bathroom Home Equipment: [name] - single point, Environmental consultant - 2 wheels, Shower seat, Tub bench, Systems analyst, [employer] - quad  Functional History: Prior Function Level of Independence: Needs assistance Gait / Transfers Assistance Needed: Using cane/RW - assist for ambulation ADL's / Homemaking Assistance Needed: Assist for ADL's  Comments: Previously ambulates with cane; still driving short distances Functional Status:  Mobility: Bed Mobility Overal bed mobility: Needs Assistance Bed Mobility: Supine to Sit, Sit to Supine Supine to sit: Min assist, Mod assist Sit to supine: Mod assist General bed mobility comments: pt relatively weak to lift LE's onto bed Transfers Overall transfer level: Needs assistance Equipment used: Rolling walker (2 wheeled), 1 person hand held assist Transfers: Sit to/from Stand, Stand Pivot Transfers Sit to Stand: Min assist, Mod assist Stand pivot transfers: Mod assist General transfer comment: mod to pwer up and min mod to control once up;  pivot mod assist Ambulation/Gait Ambulation/Gait assistance: Min assist, Mod assist Ambulation Distance (Feet): 5 Feet Assistive device: 1 person hand held assist, Rolling walker (2 wheeled) Gait Pattern/deviations: Step-to pattern, Shuffle, Wide base of support, Trunk flexed General Gait Details: Pt sidestepped up bed due to frequent sitting once  up Gait velocity: Decreased Gait velocity interpretation: Below normal speed for age/gender    ADL:    Cognition: Cognition Overall Cognitive Status: History of cognitive impairments - at baseline Orientation Level: Oriented to person, Oriented to place, Other (comment) (Pt able to state name and answer yes and no questions ) Cognition Arousal/Alertness: Awake/alert Behavior During Therapy: WFL for tasks assessed/performed, Anxious Overall Cognitive Status: History of cognitive impairments - at baseline General Comments: Non verbal the majority of session and then some broken phrases    Blood pressure 108/54, pulse 78, temperature 98.3 F (36.8 C), temperature source Oral, resp. rate 16, height 5\' 3"  (1.6 m), weight 45.632 kg (100 lb 9.6 oz), last menstrual period 09/03/1996, SpO2 97 %. Physical Exam  Nursing note and vitals reviewed. Constitutional: She appears well-developed.  Frail appearing elderly female. Anxious and teary--indicating neck pain.   HENT:  Head: Normocephalic and atraumatic.  Eyes: EOM are normal. Pupils are equal, round, and reactive to light. Right eye exhibits discharge. Left eye exhibits discharge. Right conjunctiva is injected. Left conjunctiva is injected.  Right lid matted closed and left with crusty drainage  Cardiovascular: Normal rate.  An irregular rhythm present.  Respiratory: Effort normal and breath sounds normal. No respiratory distress. She has no wheezes.  GI: Soft. Bowel sounds are normal. She exhibits no distension. There is no tenderness.  Musculoskeletal: She exhibits no edema.  Neck pain and keeps it hyperextended. Left hip and knee pain due to tightness-- improved with ROM  Neurological: She is alert.  Right facial weakness with expressive deficits. Able to state her name and chose place as hospital with choice of two.  Able to follow simple motor commands without difficulty. Mild right sided weakness noted.   Skin: Skin is warm and dry.  No erythema.  Psychiatric: Her behavior is normal. Her mood appears anxious. Her affect is labile. Her speech is delayed.  Difficulty with repeating simple words such as her name. She was able to name watch but not ringing or pen. She is nonfluent. No clearcut evidence of groping for words. Able to follow simple commands with gestural cues. Able to hold onto objects such as  a cup with both hands and place them back on the lap tray Motor strength is 4/5 bilateral deltoid, biceps, triceps, grip, hip flexor, knee extensor, ankle dorsiflexor and plantar flexor Sensation unable to assess secondary to aphasia with probable speech apraxia  Results for orders placed or performed during the hospital encounter of 03/13/15 (from the past 24 hour(s))  CBC     Status: None   Collection Time: 03/16/15  6:54 AM  Result Value Ref Range   WBC 8.3 4.0 - 10.5 K/uL   RBC 4.04 3.87 - 5.11 MIL/uL   Hemoglobin 12.5 12.0 - 15.0 g/dL   HCT 37.4 36.0 - 46.0 %   MCV 92.6 78.0 - 100.0 fL   MCH 30.9 26.0 - 34.0 pg   MCHC 33.4 30.0 - 36.0 g/dL   RDW 13.0 11.5 - 15.5 %   Platelets 255 150 - 400 K/uL  Basic metabolic panel     Status: Abnormal   Collection Time: 03/16/15  6:54 AM  Result Value Ref Range   Sodium 135 135 - 145 mmol/L   Potassium 3.9 3.5 - 5.1 mmol/L   Chloride 103 101 - 111 mmol/L   CO2 28 22 - 32 mmol/L   Glucose, Bld 115 (H) 65 - 99 mg/dL   BUN 20 6 - 20 mg/dL   Creatinine, Ser 0.89 0.44 - 1.00 mg/dL   Calcium 8.2 (L) 8.9 - 10.3 mg/dL   GFR calc non Af Amer 58 (L) >60 mL/min   GFR calc Af Amer >60 >60 mL/min   Anion gap 4 (L) 5 - 15   No results found.  Assessment/Plan: Diagnosis: Subacute left PCA distribution infarct superimposed on previous left MCA infarct causing aphasia as well as balance deficits 1. Does the need for close, 24 hr/day medical supervision in concert with the patient's rehab needs make it unreasonable for this patient to be served in a less intensive setting?  Yes 2. Co-Morbidities requiring supervision/potential complications: Atrial fibrillation, osteoporosis 3. Due to bladder management, bowel management, safety, skin/wound care, disease management, medication administration and patient education, does the patient require 24 hr/day rehab nursing? Yes 4. Does the patient require coordinated care of a physician, rehab nurse, PT (11-2 hrs/day, 5 days/week), OT (11-2 hrs/day, 5 days/week) and SLP (0.5-1 hrs/day, 5 days/week) to address physical and functional deficits in the context of the above medical diagnosis(es)? Yes Addressing deficits in the following areas: balance, endurance, locomotion, strength, transferring, bowel/bladder control, bathing, dressing, feeding, grooming, toileting, cognition, speech, language and psychosocial support 5. Can the patient actively participate in an intensive therapy program of at least 3 hrs of therapy per day at least 5 days per week? Yes 6. The potential for patient to make measurable gains while on inpatient rehab is excellent 7. Anticipated functional outcomes upon discharge from inpatient rehab are supervision  with PT, supervision with OT, supervision with SLP. 8. Estimated rehab length of stay to reach the above functional goals is: 9-12 days 9. Does the patient have adequate social supports and living environment to accommodate these discharge functional goals? Yes 10. Anticipated D/C setting: Home 11. Anticipated post D/C treatments: [place] therapy 12. Overall Rehab/Functional Prognosis: excellent  RECOMMENDATIONS: This patient's condition is appropriate for continued rehabilitative care in the following setting: CIR Patient has agreed to participate in recommended program. NA, Aphasic Note that insurance prior authorization may be required for reimbursement for recommended care.  Comment: Speech swallow evaluation notes that patient does not need speech follow-up for swallowing however  patient would benefit  from speech therapy for language/cognitive issues    03/16/2015

## 2015-03-16 NOTE — Evaluation (Addendum)
Occupational Therapy Evaluation Patient Details Name: Linda [name] MRN: [id number] DOB: 01-12-1929 Today's Date: 03/16/2015    History of Present Illness 81 y.o. female has a past medical history of History of cardiac catheterization (2002); Vertebral fracture; Restless legs syndrome with nocturnal myoclonus (05/13/2013); Abnormal uterine bleeding (3235,5732); Hypothyroidism; Arthritis; Osteoporosis; History of stomach ulcers; History of jaundice as a child; Anemia; Head pain, chronic; Dizziness; and Hemiparesis and speech and language deficit as late effects of stroke (02/09/2015). Pt admitted with pain and confusion. CT revealed Progression of the left frontoparietal and left occipital infarcts.   Clinical Impression    Pt admitted with above. Pt requiring assist for ADLs, PTA. Feel pt will benefit from acute OT to increase independence and strength prior to d/c. Recommending CIR for rehab.   Follow Up Recommendations  CIR;Supervision/Assistance - 24 hour    Equipment Recommendations  Other (comment) (defer to next venue)    Recommendations for Other Services Rehab consult     Precautions / Restrictions Precautions Precautions: Fall Restrictions Weight Bearing Restrictions: No      Mobility Bed Mobility Overal bed mobility: Needs Assistance Bed Mobility: Supine to Sit     Supine to sit: Supervision        Transfers Overall transfer level: Needs assistance Equipment used: Rolling walker (2 wheeled);1 person hand held assist Transfers: Sit to/from [facility] Sit to Stand: Mod assist Stand pivot transfers: Mod assist       General transfer comment: cues for hand placement, but pt still pulled up on walker.     Balance  Pt using RW for stand pivot and for sit to stand as well as assist from therapist.                                           ADL Overall ADL's : Needs assistance/impaired Eating/Feeding: Minimal  assistance;Sitting Eating/Feeding Details (indicate cue type and reason): OT assisted in getting eggs on fork                 Lower Body Dressing: Moderate assistance;Sit to/from stand;Sitting/lateral leans   Toilet Transfer: Moderate assistance;Stand-pivot;RW (from bed to chair)           Functional mobility during ADLs: Moderate assistance;Rolling walker (stand pivot) General ADL Comments: Pt able to don/doff socks sitting EOB.      Vision Pt reports she wears glasses and reported change in vision when asked (reported double vision at times) Vision Assessment?:  (difficult to assess due to impaired communication)   Perception     Praxis      Pertinent Vitals/Pain Pain Assessment: Faces Faces Pain Scale: Hurts even more Pain Location: all over Pain Descriptors / Indicators: Sore Pain Intervention(s): Monitored during session;Other (comment) (notified nurse)     Hand Dominance Right   Extremity/Trunk Assessment Upper Extremity Assessment Upper Extremity Assessment: RUE deficits/detail;Generalized weakness RUE Deficits / Details: right grip strength weaker than left RUE Coordination: decreased fine motor   Lower Extremity Assessment Lower Extremity Assessment: Defer to PT evaluation       Communication Communication Communication: Expressive difficulties;Receptive difficulties   Cognition Arousal/Alertness: Awake/alert Behavior During Therapy: WFL for tasks assessed/performed Overall Cognitive Status: History of cognitive impairments - at baseline                 General Comments: difficult to assess due to impaired communication   General  Comments       Exercises       Shoulder Instructions      Home Living Family/patient expects to be discharged to:: Private residence Living Arrangements: Children Available Help at Discharge: Family;Friend(s);Available PRN/intermittently Type of Home: House Home Access: Stairs to enter [employer] of Steps: 4 Entrance Stairs-Rails: Left Home Layout: Two level;Laundry or work area in basement;Able to live on main level with bedroom/bathroom               Home Equipment: [name] - single point;Walker - 2 wheels;Shower seat;Tub bench;Transport chair;Cane - quad          Prior Functioning/Environment Level of Independence: Needs assistance  Gait / Transfers Assistance Needed: Using cane/RW - assist for ambulation ADL's / Homemaking Assistance Needed: Assist for ADLs   Comments: Previously ambulated with cane; still driving short distances    OT Diagnosis: Acute pain;Other (comment) (Hemiparesis dominant side)   OT Problem List: Decreased strength;Pain;Decreased knowledge of precautions;Decreased knowledge of use of DME or AE;Impaired UE functional use;Decreased coordination;Decreased cognition;Impaired balance (sitting and/or standing);Decreased activity tolerance;Decreased range of motion   OT Treatment/Interventions: Self-care/ADL training;Neuromuscular education;Therapeutic exercise;DME and/or AE instruction;Therapeutic activities;Cognitive remediation/compensation;Visual/perceptual remediation/compensation;Balance training;Patient/family education    OT Goals(Current goals can be found in the care plan section) Acute Rehab OT Goals Patient Stated Goal: not stated OT Goal Formulation: With patient Time For Goal Achievement: 03/23/15 Potential to Achieve Goals: Good ADL Goals Pt Will Perform Eating: with set-up;sitting;with adaptive utensils;with supervision Pt Will Perform Grooming: with set-up;with adaptive equipment;sitting;with supervision Pt Will Perform Lower Body Dressing: with min assist;sitting/lateral leans Pt Will Transfer to Toilet: with min assist;stand pivot transfer;bedside commode Pt Will Perform Toileting - Clothing Manipulation and hygiene: with min assist;sitting/lateral leans  OT Frequency: Min 2X/week   Barriers to D/C:             Co-evaluation              End of Session Equipment Utilized During Treatment: Gait belt;Rolling walker Nurse Communication: Mobility status;Other (comment) (needs assist feeding; in chair with alarm)  Activity Tolerance: Patient tolerated treatment well Patient left: in chair;with call bell/phone within reach;with chair alarm set   Time: 9935-7017 OT Time Calculation (min): 18 min Charges:  OT General Charges $OT Visit: 1 Procedure OT Evaluation $Initial OT Evaluation Tier I: 1 Procedure G-CodesBenito Mccreedy OTR/L [phone] 03/16/2015, 11:40 AM

## 2015-03-16 NOTE — Care Management Important Message (Signed)
Important Message  Patient Details  Name: Linda [name] MRN: [id number] Date of Birth: 23-Apr-1929   Medicare Important Message Given:  Yes-second notification given    Nathen May 03/16/2015, 2:38 [place] Message  Patient Details  Name: Linda [name] MRN: [id number] Date of Birth: Dec 22, 1928   Medicare Important Message Given:  Yes-second notification given    Nathen May 03/16/2015, 2:38 PM

## 2015-03-17 ENCOUNTER — Inpatient Hospital Stay (HOSPITAL_COMMUNITY)
Admission: RE | Admit: 2015-03-17 | Discharge: 2015-03-26 | DRG: 057 | Disposition: A | Discharge status: Home Health | Payer: Medicare Other | Source: Intra-hospital | Attending: Physical Medicine & Rehabilitation | Admitting: Physical Medicine & Rehabilitation

## 2015-03-17 DIAGNOSIS — E871 Hypo-osmolality and hyponatremia: Secondary | ICD-10-CM | POA: Diagnosis not present

## 2015-03-17 DIAGNOSIS — IMO0002 Reserved for concepts with insufficient information to code with codable children: Secondary | ICD-10-CM

## 2015-03-17 DIAGNOSIS — I69920 Aphasia following unspecified cerebrovascular disease: Secondary | ICD-10-CM | POA: Diagnosis not present

## 2015-03-17 DIAGNOSIS — I482 Chronic atrial fibrillation: Secondary | ICD-10-CM | POA: Diagnosis not present

## 2015-03-17 DIAGNOSIS — N179 Acute kidney failure, unspecified: Secondary | ICD-10-CM | POA: Diagnosis present

## 2015-03-17 DIAGNOSIS — M542 Cervicalgia: Secondary | ICD-10-CM | POA: Diagnosis present

## 2015-03-17 DIAGNOSIS — H109 Unspecified conjunctivitis: Secondary | ICD-10-CM | POA: Diagnosis present

## 2015-03-17 DIAGNOSIS — E860 Dehydration: Secondary | ICD-10-CM | POA: Diagnosis present

## 2015-03-17 DIAGNOSIS — G2581 Restless legs syndrome: Secondary | ICD-10-CM | POA: Diagnosis present

## 2015-03-17 DIAGNOSIS — Z823 Family history of stroke: Secondary | ICD-10-CM | POA: Diagnosis not present

## 2015-03-17 DIAGNOSIS — M810 Age-related osteoporosis without current pathological fracture: Secondary | ICD-10-CM | POA: Diagnosis present

## 2015-03-17 DIAGNOSIS — G8929 Other chronic pain: Secondary | ICD-10-CM | POA: Diagnosis present

## 2015-03-17 DIAGNOSIS — I69320 Aphasia following cerebral infarction: Secondary | ICD-10-CM | POA: Diagnosis present

## 2015-03-17 DIAGNOSIS — I69392 Facial weakness following cerebral infarction: Secondary | ICD-10-CM

## 2015-03-17 DIAGNOSIS — R4189 Other symptoms and signs involving cognitive functions and awareness: Secondary | ICD-10-CM | POA: Diagnosis present

## 2015-03-17 DIAGNOSIS — E039 Hypothyroidism, unspecified: Secondary | ICD-10-CM | POA: Diagnosis present

## 2015-03-17 DIAGNOSIS — I69359 Hemiplegia and hemiparesis following cerebral infarction affecting unspecified side: Secondary | ICD-10-CM

## 2015-03-17 DIAGNOSIS — I63432 Cerebral infarction due to embolism of left posterior cerebral artery: Secondary | ICD-10-CM

## 2015-03-17 DIAGNOSIS — I63512 Cerebral infarction due to unspecified occlusion or stenosis of left middle cerebral artery: Secondary | ICD-10-CM

## 2015-03-17 DIAGNOSIS — I6930 Unspecified sequelae of cerebral infarction: Secondary | ICD-10-CM | POA: Diagnosis not present

## 2015-03-17 DIAGNOSIS — R482 Apraxia: Secondary | ICD-10-CM | POA: Diagnosis not present

## 2015-03-17 DIAGNOSIS — I4891 Unspecified atrial fibrillation: Secondary | ICD-10-CM | POA: Diagnosis present

## 2015-03-17 DIAGNOSIS — G9340 Encephalopathy, unspecified: Secondary | ICD-10-CM

## 2015-03-17 DIAGNOSIS — I10 Essential (primary) hypertension: Secondary | ICD-10-CM

## 2015-03-17 DIAGNOSIS — N390 Urinary tract infection, site not specified: Principal | ICD-10-CM

## 2015-03-17 DIAGNOSIS — I69398 Other sequelae of cerebral infarction: Secondary | ICD-10-CM | POA: Diagnosis not present

## 2015-03-17 DIAGNOSIS — I69328 Other speech and language deficits following cerebral infarction: Secondary | ICD-10-CM

## 2015-03-17 MED ORDER — ONDANSETRON HCL 4 MG/2ML IJ SOLN: 4.0000 mg | Freq: Four times a day (QID) | INTRAMUSCULAR | Status: DC | PRN

## 2015-03-17 MED ORDER — METHOCARBAMOL 500 MG PO TABS
500.0000 mg | ORAL_TABLET | Freq: Three times a day (TID) | ORAL | Status: DC
  Administered 2015-03-17 – 2015-03-22 (×13): 500 mg via ORAL
  Filled 2015-03-17 (×21): qty 1

## 2015-03-17 MED ORDER — ONDANSETRON HCL 4 MG PO TABS: 4.0000 mg | ORAL_TABLET | Freq: Four times a day (QID) | ORAL | Status: DC | PRN

## 2015-03-17 MED ORDER — LEVOTHYROXINE SODIUM 50 MCG PO TABS
50.0000 ug | ORAL_TABLET | Freq: Every day | ORAL | Status: DC
  Administered 2015-03-18 – 2015-03-26 (×9): 50 ug via ORAL
  Filled 2015-03-17 (×11): qty 1

## 2015-03-17 MED ORDER — ROPINIROLE HCL 0.25 MG PO TABS
0.2500 mg | ORAL_TABLET | Freq: Three times a day (TID) | ORAL | Status: DC
  Administered 2015-03-17 – 2015-03-26 (×26): 0.25 mg via ORAL
  Filled 2015-03-17 (×29): qty 1

## 2015-03-17 MED ORDER — POLYVINYL ALCOHOL 1.4 % OP SOLN
1.0000 [drp] | Freq: Three times a day (TID) | OPHTHALMIC | Status: DC | PRN
  Filled 2015-03-17: qty 15

## 2015-03-17 MED ORDER — GUAIFENESIN-DM 100-10 MG/5ML PO SYRP: 5.0000 mL | ORAL_SOLUTION | Freq: Four times a day (QID) | ORAL | Status: DC | PRN

## 2015-03-17 MED ORDER — ALUM & MAG HYDROXIDE-SIMETH 200-200-20 MG/5ML PO SUSP: 30.0000 mL | ORAL | Status: DC | PRN

## 2015-03-17 MED ORDER — APIXABAN 2.5 MG PO TABS
2.5000 mg | ORAL_TABLET | Freq: Two times a day (BID) | ORAL | Status: DC
  Administered 2015-03-17 – 2015-03-26 (×18): 2.5 mg via ORAL
  Filled 2015-03-17 (×22): qty 1

## 2015-03-17 MED ORDER — TRAMADOL HCL 50 MG PO TABS
50.0000 mg | ORAL_TABLET | Freq: Four times a day (QID) | ORAL | Status: DC | PRN
  Administered 2015-03-18 – 2015-03-22 (×3): 50 mg via ORAL
  Filled 2015-03-17 (×3): qty 1

## 2015-03-17 MED ORDER — SERTRALINE HCL 50 MG PO TABS
50.0000 mg | ORAL_TABLET | Freq: Every day | ORAL | Status: DC
  Administered 2015-03-18 – 2015-03-26 (×9): 50 mg via ORAL
  Filled 2015-03-17 (×11): qty 1

## 2015-03-17 MED ORDER — APIXABAN 2.5 MG PO TABS: 2.5000 mg | ORAL_TABLET | Freq: Two times a day (BID) | ORAL | Status: DC

## 2015-03-17 MED ORDER — TRAZODONE HCL 50 MG PO TABS
25.0000 mg | ORAL_TABLET | Freq: Every evening | ORAL | Status: DC | PRN
Start: 2015-03-17 — End: 2015-03-26
  Administered 2015-03-25: 50 mg via ORAL
  Filled 2015-03-17: qty 1

## 2015-03-17 MED ORDER — TOBRAMYCIN 0.3 % OP OINT: TOPICAL_OINTMENT | Freq: Four times a day (QID) | OPHTHALMIC | Status: DC

## 2015-03-17 MED ORDER — DOCUSATE SODIUM 100 MG PO CAPS
100.0000 mg | ORAL_CAPSULE | Freq: Two times a day (BID) | ORAL | Status: DC | PRN
  Filled 2015-03-17: qty 1

## 2015-03-17 MED ORDER — BISACODYL 10 MG RE SUPP
10.0000 mg | Freq: Every day | RECTAL | Status: DC | PRN
  Administered 2015-03-18: 10 mg via RECTAL
  Filled 2015-03-17: qty 1

## 2015-03-17 MED ORDER — ACETAMINOPHEN 325 MG PO TABS
325.0000 mg | ORAL_TABLET | ORAL | Status: DC | PRN
  Administered 2015-03-19 – 2015-03-23 (×3): 650 mg via ORAL
  Filled 2015-03-17 (×4): qty 2

## 2015-03-17 MED ORDER — TRAMADOL HCL 50 MG PO TABS: 50.0000 mg | ORAL_TABLET | Freq: Four times a day (QID) | ORAL | Status: DC | PRN

## 2015-03-17 MED ORDER — FLEET ENEMA 7-19 GM/118ML RE ENEM: 1.0000 | ENEMA | Freq: Once | RECTAL | Status: AC | PRN

## 2015-03-17 MED ORDER — SENNOSIDES-DOCUSATE SODIUM 8.6-50 MG PO TABS
2.0000 | ORAL_TABLET | Freq: Every day | ORAL | Status: DC
  Administered 2015-03-18 – 2015-03-24 (×5): 2 via ORAL
  Filled 2015-03-17 (×8): qty 2

## 2015-03-17 MED ORDER — PANTOPRAZOLE SODIUM 40 MG PO PACK
40.0000 mg | PACK | Freq: Every day | ORAL | Status: DC | PRN
  Filled 2015-03-17: qty 20

## 2015-03-17 MED ORDER — METOPROLOL TARTRATE 25 MG PO TABS
25.0000 mg | ORAL_TABLET | Freq: Two times a day (BID) | ORAL | Status: DC
  Administered 2015-03-17 – 2015-03-19 (×4): 25 mg via ORAL
  Filled 2015-03-17 (×10): qty 1

## 2015-03-17 MED ORDER — METHOCARBAMOL 500 MG PO TABS: 500.0000 mg | ORAL_TABLET | Freq: Three times a day (TID) | ORAL | Status: DC

## 2015-03-17 MED ORDER — TOBRAMYCIN 0.3 % OP OINT
TOPICAL_OINTMENT | Freq: Four times a day (QID) | OPHTHALMIC | Status: AC
  Administered 2015-03-17 – 2015-03-19 (×7): via OPHTHALMIC
  Administered 2015-03-19 – 2015-03-20 (×2): 1 via OPHTHALMIC
  Administered 2015-03-20 – 2015-03-21 (×6): via OPHTHALMIC
  Filled 2015-03-17: qty 3.5

## 2015-03-17 NOTE — Progress Notes (Signed)
Report called to RN, will take patient as soon as dinner arrives, per Nurse request

## 2015-03-17 NOTE — Progress Notes (Addendum)
At approx 1440 pm, pt found on floor by CSW. CSW notified RN. Upon arrival, tech was in the room. Pt says she is not in any pain. Pt assessed thoroughly. VS obtained 134/84, 100% saturation. MD notified. Family notified. Yellow socks on, call bell within reach. RN called several numbers, Spoke to [employer] (pt son) on the phone regarding the situation.

## 2015-03-17 NOTE — Progress Notes (Signed)
[name], MD Physician Signed Physical Medicine and Rehabilitation Consult Note 03/16/2015 8:14 AM  Related encounter: ED to Hosp-Admission (Current) from 03/13/2015 in [place] Collapse All        Physical Medicine and Rehabilitation Consult   Reason for Consult: Weakness  Referring Physician: Dr. Tana Coast.    HPI: Linda [name] is a 81 y.o. 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 She was readmitted on 03/14/15 with malaise and confusion due to recurrent UTI. She was started on IV rocephin for treatment with improvement. ST evaluation without evidence of dysphagia. PT evaluation done yesterday and CIR recommended for follow up therapy.   Patient is aphasic and unable to voice a complaint.  Review of Systems  Unable to perform ROS: language  Eyes: Positive for pain, discharge and redness.  Respiratory: Negative for shortness of breath.  Cardiovascular: Negative for chest pain.  Musculoskeletal: Positive for back pain, joint pain and neck pain.  Psychiatric/Behavioral: The patient is nervous/anxious.      Past Medical History  Diagnosis Date  . History of cardiac catheterization 2002    Negative  . Vertebral fracture   . Restless legs syndrome with nocturnal myoclonus 05/13/2013    [place] controlled   . Abnormal uterine bleeding [id number]  . Hypothyroidism   . Arthritis   . Osteoporosis   . History of stomach ulcers   . History of jaundice as a child   . Anemia   . Head pain, chronic     "I have pain in the back of my head for months"  . Dizziness     TAKES MECLIZINE TO TREAT  . Hemiparesis and speech and language deficit as late effects of stroke 02/09/2015    Past Surgical History    Procedure Laterality Date  . Back surgery      x 3  . Breast surgery Bilateral     1 lump each breast removed and benign  . Nasal sinus surgery    . Dilation and curettage of uterus  1970  . Catheterization  2003    cardiac cath  . Kyphoplasty  2010  . Cataract extraction Bilateral 2011  . Cholecystectomy  1995    Lap  . Cardiac catheterization  2002    "it was normal" per pt - no record found  . Robotic assisted salpingo oopherectomy Bilateral 11/02/2014    Procedure: ROBOTIC ASSISTED bilateral SALPINGO OOPHORECTOMY; Surgeon: Everitt Amber, MD; Location: [facility]; Service: Gynecology; Laterality: Bilateral;  . Laparoscopy N/A 11/02/2014    Procedure: LAPAROSCOPY DIAGNOSTIC; Surgeon: Everitt Amber, MD; Location: [facility]; Service: Gynecology; Laterality: N/A;  . Abdominal hysterectomy      Family History  Problem Relation Age of Onset  . Asthma Brother   . Osteoarthritis Son   . Hypertension Father   . Stroke Father   . Cancer Brother     stomach     Social History: Was independent prior to 12/2014. Lives with son who provides supervision. Able to ambulate with walker PTA? Per reports that she has never smoked. She has never used smokeless tobacco. Per reports that she does not drink alcohol or use illicit drugs.   Allergies  Allergen Reactions  . Ibuprofen Anaphylaxis    Passed out and had to be hospitalized  .  Penicillins     Skin on head was crawling and very very sleepy  . Demerol [Meperidine Hcl] Nausea And Vomiting  . Dilaudid [Hydromorphone Hcl] Nausea And Vomiting  . Fentanyl Other (See Comments)    unknown  . Lidoderm [Lidocaine] Other (See Comments)    unknown  . Lyrica [Pregabalin]     "made me a zombie'  . Morphine And Related Nausea And Vomiting  . Other     hamburger  . Percocet [Oxycodone-Acetaminophen]  Nausea And Vomiting  . Tramadol Other (See Comments)    unknown  . Vicodin [Hydrocodone-Acetaminophen] Nausea And Vomiting    Medications Prior to Admission  Medication Sig Dispense Refill  . acetaminophen (TYLENOL) 500 MG tablet Take 1,000 mg by mouth every 6 (six) hours as needed for mild pain. Usually only takes 2 x a day    . apixaban (ELIQUIS) 5 MG TABS tablet Take 1 tablet (5 mg total) by mouth 2 (two) times daily. Please discontinue Aspirin once started on Eliquis! 60 tablet 3  . calcium gluconate 500 MG tablet Take 500 mg by mouth 2 (two) times daily. With 1000 units D3    . cephALEXin (KEFLEX) 500 MG capsule Take 1 capsule (500 mg total) by mouth 3 (three) times daily. 21 capsule 0  . cholecalciferol (VITAMIN D) 1000 UNITS tablet Take 1,000 Units by mouth 2 (two) times daily. Take with calium    . Cranberry 200 MG CAPS Take 1 capsule by mouth daily with lunch.     . denosumab (PROLIA) 60 MG/ML SOLN injection Inject 60 mg into the skin every 6 (six) months. Administer in upper arm, thigh, or abdomen    . docusate sodium (COLACE) 100 MG capsule Take 100 mg by mouth 2 (two) times daily as needed for mild constipation or moderate constipation.    . Flaxseed, Linseed, (FLAX SEED OIL) 1000 MG CAPS Take 1 capsule by mouth 2 (two) times daily.     . hyoscyamine (ANASPAZ) 0.125 MG TBDP disintergrating tablet Place 0.125 mg under the tongue 2 (two) times daily as needed for bladder spasms.    Marland Kitchen levothyroxine (SYNTHROID, LEVOTHROID) 50 MCG tablet Take 50 mcg by mouth every morning.     . meclizine (ANTIVERT) 25 MG tablet Take 25 mg by mouth daily as needed for dizziness.     . metoprolol tartrate (LOPRESSOR) 25 MG tablet Take 25 mg by mouth 2 (two) times daily.    . Multiple Vitamins-Minerals (CENTRUM SILVER PO) Take 1 tablet by mouth daily with lunch.     . ondansetron (ZOFRAN) 4 MG tablet Take 1 tablet (4  mg total) by mouth every 8 (eight) hours as needed for nausea or vomiting. 20 tablet 0  . pantoprazole sodium (PROTONIX) 40 mg/20 mL PACK Take 20 mLs (40 mg total) by mouth daily. (Patient taking differently: Take 40 mg by mouth daily as needed (acid reflux, heartburn). ) 30 each 4  . Propylene Glycol (SYSTANE BALANCE OP) Apply 1 drop to eye 2 (two) times daily.    Marland Kitchen rOPINIRole (REQUIP) 0.25 MG tablet TAKE 1 TABLET BY MOUTH 3 TIMES A DAY 270 tablet 2  . sennosides-docusate sodium (SENOKOT-S) 8.6-50 MG tablet Take 2 tablets by mouth daily.    . sertraline (ZOLOFT) 50 MG tablet Take 50 mg by mouth daily.    . traMADol (ULTRAM) 50 MG tablet Take 50 mg by mouth every 6 (six) hours as needed for severe pain.      Home: Home Living Family/patient expects  to be discharged to:: Private residence Living Arrangements: Children Available Help at Discharge: Family, Friend(s), Available PRN/intermittently Type of Home: House Home Access: Stairs to enter Technical brewer of Steps: 4 Entrance Stairs-Rails: Left Home Layout: Two level, Laundry or work area in basement, Able to live on main level with bedroom/bathroom Home Equipment: Radio producer - single point, Environmental consultant - 2 wheels, Shower seat, Tub bench, Systems analyst, [employer] - quad  Functional History: Prior Function Level of Independence: Needs assistance Gait / Transfers Assistance Needed: Using cane/RW - assist for ambulation ADL's / Homemaking Assistance Needed: Assist for ADL's  Comments: Previously ambulates with cane; still driving short distances Functional Status:  Mobility: Bed Mobility Overal bed mobility: Needs Assistance Bed Mobility: Supine to Sit, Sit to Supine Supine to sit: Min assist, Mod assist Sit to supine: Mod assist General bed mobility comments: pt relatively weak to lift LE's onto bed Transfers Overall transfer level: Needs assistance Equipment used: Rolling walker (2 wheeled), 1 person  hand held assist Transfers: Sit to/from Stand, Stand Pivot Transfers Sit to Stand: Min assist, Mod assist Stand pivot transfers: Mod assist General transfer comment: mod to pwer up and min mod to control once up; pivot mod assist Ambulation/Gait Ambulation/Gait assistance: Min assist, Mod assist Ambulation Distance (Feet): 5 Feet Assistive device: 1 person hand held assist, Rolling walker (2 wheeled) Gait Pattern/deviations: Step-to pattern, Shuffle, Wide base of support, Trunk flexed General Gait Details: Pt sidestepped up bed due to frequent sitting once up Gait velocity: Decreased Gait velocity interpretation: Below normal speed for age/gender    ADL:    Cognition: Cognition Overall Cognitive Status: History of cognitive impairments - at baseline Orientation Level: Oriented to person, Oriented to place, Other (comment) (Pt able to state name and answer yes and no questions ) Cognition Arousal/Alertness: Awake/alert Behavior During Therapy: WFL for tasks assessed/performed, Anxious Overall Cognitive Status: History of cognitive impairments - at baseline General Comments: Non verbal the majority of session and then some broken phrases    Blood pressure 108/54, pulse 78, temperature 98.3 F (36.8 C), temperature source Oral, resp. rate 16, height 5\' 3"  (1.6 m), weight 45.632 kg (100 lb 9.6 oz), last menstrual period 09/03/1996, SpO2 97 %. Physical Exam  Nursing note and vitals reviewed. Constitutional: She appears well-developed.  Frail appearing elderly female. Anxious and teary--indicating neck pain.  HENT:  Head: Normocephalic and atraumatic.  Eyes: EOM are normal. Pupils are equal, round, and reactive to light. Right eye exhibits discharge. Left eye exhibits discharge. Right conjunctiva is injected. Left conjunctiva is injected.  Right lid matted closed and left with crusty drainage  Cardiovascular: Normal rate. An irregular rhythm present.  Respiratory: Effort normal  and breath sounds normal. No respiratory distress. She has no wheezes.  GI: Soft. Bowel sounds are normal. She exhibits no distension. There is no tenderness.  Musculoskeletal: She exhibits no edema.  Neck pain and keeps it hyperextended. Left hip and knee pain due to tightness-- improved with ROM  Neurological: She is alert.  Right facial weakness with expressive deficits. Able to state her name and chose place as hospital with choice of two. Able to follow simple motor commands without difficulty. Mild right sided weakness noted.  Skin: Skin is warm and dry. No erythema.  Psychiatric: Her behavior is normal. Her mood appears anxious. Her affect is labile. Her speech is delayed.  Difficulty with repeating simple words such as her name. She was able to name watch but not ringing or pen. She is nonfluent. No  clearcut evidence of groping for words. Able to follow simple commands with gestural cues. Able to hold onto objects such as a cup with both hands and place them back on the lap tray Motor strength is 4/5 bilateral deltoid, biceps, triceps, grip, hip flexor, knee extensor, ankle dorsiflexor and plantar flexor Sensation unable to assess secondary to aphasia with probable speech apraxia   Lab Results Last 24 Hours    Results for orders placed or performed during the hospital encounter of 03/13/15 (from the past 24 hour(s))  CBC Status: None   Collection Time: 03/16/15 6:54 AM  Result Value Ref Range   WBC 8.3 4.0 - 10.5 K/uL   RBC 4.04 3.87 - 5.11 MIL/uL   Hemoglobin 12.5 12.0 - 15.0 g/dL   HCT 37.4 36.0 - 46.0 %   MCV 92.6 78.0 - 100.0 fL   MCH 30.9 26.0 - 34.0 pg   MCHC 33.4 30.0 - 36.0 g/dL   RDW 13.0 11.5 - 15.5 %   Platelets 255 150 - 400 K/uL  Basic metabolic panel Status: Abnormal   Collection Time: 03/16/15 6:54 AM  Result Value Ref Range   Sodium 135 135 - 145 mmol/L   Potassium 3.9 3.5 - 5.1 mmol/L    Chloride 103 101 - 111 mmol/L   CO2 28 22 - 32 mmol/L   Glucose, Bld 115 (H) 65 - 99 mg/dL   BUN 20 6 - 20 mg/dL   Creatinine, Ser 0.89 0.44 - 1.00 mg/dL   Calcium 8.2 (L) 8.9 - 10.3 mg/dL   GFR calc non Af Amer 58 (L) >60 mL/min   GFR calc Af Amer >60 >60 mL/min   Anion gap 4 (L) 5 - 15      Imaging Results (Last 48 hours)    No results found.    Assessment/Plan: Diagnosis: Subacute left PCA distribution infarct superimposed on previous left MCA infarct causing aphasia as well as balance deficits 1. Does the need for close, 24 hr/day medical supervision in concert with the patient's rehab needs make it unreasonable for this patient to be served in a less intensive setting? Yes 2. Co-Morbidities requiring supervision/potential complications: Atrial fibrillation, osteoporosis 3. Due to bladder management, bowel management, safety, skin/wound care, disease management, medication administration and patient education, does the patient require 24 hr/day rehab nursing? Yes 4. Does the patient require coordinated care of a physician, rehab nurse, PT (11-2 hrs/day, 5 days/week), OT (11-2 hrs/day, 5 days/week) and SLP (0.5-1 hrs/day, 5 days/week) to address physical and functional deficits in the context of the above medical diagnosis(es)? Yes Addressing deficits in the following areas: balance, endurance, locomotion, strength, transferring, bowel/bladder control, bathing, dressing, feeding, grooming, toileting, cognition, speech, language and psychosocial support 5. Can the patient actively participate in an intensive therapy program of at least 3 hrs of therapy per day at least 5 days per week? Yes 6. The potential for patient to make measurable gains while on inpatient rehab is excellent 7. Anticipated functional outcomes upon discharge from inpatient rehab are supervision with PT, supervision with OT, supervision with SLP. 8. Estimated rehab length of stay to  reach the above functional goals is: 9-12 days 9. Does the patient have adequate social supports and living environment to accommodate these discharge functional goals? Yes 10. Anticipated D/C setting: Home 11. Anticipated post D/C treatments: [place] therapy 12. Overall Rehab/Functional Prognosis: excellent  RECOMMENDATIONS: This patient's condition is appropriate for continued rehabilitative care in the following setting: CIR Patient has agreed to participate in  recommended program. NA, Aphasic Note that insurance prior authorization may be required for reimbursement for recommended care.  Comment: Speech swallow evaluation notes that patient does not need speech follow-up for swallowing however patient would benefit from speech therapy for language/cognitive issues    03/16/2015       Revision History     Date/Time User Provider Type Action   03/16/2015 11:31 AM [name], MD Physician Sign   03/16/2015 8:58 AM Bary Leriche, PA-C Physician Assistant Pend   View Details Report       Routing History     Date/Time From To Method   03/16/2015 11:31 AM [name], MD [name], MD In Basket   03/16/2015 11:31 AM [name], MD Deland Pretty, MD Fax

## 2015-03-17 NOTE — Discharge Summary (Signed)
Physician Discharge Summary   Patient ID: Linda [name] MRN: [id number] DOB/AGE: 1929/02/13 81 y.o.  Admit date: 03/13/2015 Discharge date: 03/17/2015  Primary Care Physician:  [name], MD  Discharge Diagnoses:    . UTI (lower urinary tract infection) . Altered mental state . atypical musculoskeletal Chest pain   Conjunctivitis  . Essential hypertension . Paroxysmal atrial fibrillation . Encephalopathy   Consults: Inpatient rehabilitation   Recommendations for Outpatient Follow-up:  Patient has received IV Rocephin through the hospitalization, does not need any further IV antibiotics  Please note change in eliquis dose 2.5 mg twice a day  TESTS THAT NEED FOLLOW-UP CBC, BMET   DIET: Heart healthy diet    Allergies:   Allergies  Allergen Reactions  . Ibuprofen Anaphylaxis    Passed out and had to be hospitalized  . Penicillins     Skin on head was crawling and very very sleepy  . Demerol [Meperidine Hcl] Nausea And Vomiting  . Dilaudid [Hydromorphone Hcl] Nausea And Vomiting  . Fentanyl Other (See Comments)    unknown  . Lidoderm [Lidocaine] Other (See Comments)    unknown  . Lyrica [Pregabalin]     "made me a zombie'  . Morphine And Related Nausea And Vomiting  . Other     hamburger  . Percocet [Oxycodone-Acetaminophen] Nausea And Vomiting  . Tramadol Other (See Comments)    unknown  . Vicodin [Hydrocodone-Acetaminophen] Nausea And Vomiting     Discharge Medications:   Medication List    STOP taking these medications        cephALEXin 500 MG capsule  Commonly known as:  KEFLEX      TAKE these medications        acetaminophen 500 MG tablet  Commonly known as:  TYLENOL  Take 1,000 mg by mouth every 6 (six) hours as needed for mild pain. Usually only takes 2 x a  day     apixaban 2.5 MG Tabs tablet  Commonly known as:  ELIQUIS  Take 1 tablet (2.5 mg total) by mouth 2 (two) times daily.     calcium gluconate 500 MG tablet   Take 500 mg by mouth 2 (two) times daily. With 1000 units D3     CENTRUM SILVER PO  Take 1 tablet by mouth daily with lunch.     cholecalciferol 1000 UNITS tablet  Commonly known as:  VITAMIN D  Take 1,000 Units by mouth 2 (two) times daily. Take with calium     Cranberry 200 MG Caps  Take 1 capsule by mouth daily with lunch.     denosumab 60 MG/ML Soln injection  Commonly known as:  PROLIA  Inject 60 mg into the skin every 6 (six) months. Administer in upper arm, thigh, or abdomen     docusate sodium 100 MG capsule  Commonly known as:  COLACE  Take 100 mg by mouth 2 (two) times daily as needed for mild constipation or moderate constipation.     Flax Seed Oil 1000 MG Caps  Take 1 capsule by mouth 2 (two) times daily.     hyoscyamine 0.125 MG Tbdp disintergrating tablet  Commonly known as:  ANASPAZ  Place 0.125 mg under the tongue 2 (two) times daily as needed for bladder spasms.     levothyroxine 50 MCG tablet  Commonly known as:  SYNTHROID, LEVOTHROID  Take 50 mcg by mouth every morning.     meclizine 25 MG tablet  Commonly known as:  ANTIVERT  Take 25 mg  by mouth daily as needed for dizziness.     methocarbamol 500 MG tablet  Commonly known as:  ROBAXIN  Take 1 tablet (500 mg total) by mouth 3 (three) times daily.     metoprolol tartrate 25 MG tablet  Commonly known as:  LOPRESSOR  Take 25 mg by mouth 2 (two) times daily.     ondansetron 4 MG tablet  Commonly known as:  ZOFRAN  Take 1 tablet (4 mg total) by mouth every 8 (eight) hours as needed for nausea or vomiting.     pantoprazole sodium 40 mg/20 mL Pack  Commonly known as:  PROTONIX  Take 20 mLs (40 mg total) by mouth daily.     rOPINIRole 0.25 MG tablet  Commonly known as:  REQUIP  TAKE 1 TABLET BY MOUTH 3 TIMES A DAY     sennosides-docusate sodium 8.6-50 MG tablet  Commonly known as:  SENOKOT-S  Take 2 tablets by mouth daily.     sertraline 50 MG tablet  Commonly known as:  ZOLOFT  Take 50 mg by  mouth daily.     SYSTANE BALANCE OP  Apply 1 drop to eye 2 (two) times daily.     tobramycin 0.3 % ophthalmic ointment  Commonly known as:  TOBREX  Place into both eyes 4 (four) times daily.      traMADol 50 MG tablet  Commonly known as:  ULTRAM  Take 1 tablet (50 mg total) by mouth every 6 (six) hours as needed for severe pain.         Brief H and P: For complete details please refer to admission H and P, but in brief Linda [name] is a 81 y.o. female  has a past medical history of History of cardiac catheterization (2002); Vertebral fracture; Restless legs syndrome with nocturnal myoclonus (05/13/2013); Abnormal uterine bleeding (6599,3570); Hypothyroidism; Arthritis; Osteoporosis; History of stomach ulcers; History of jaundice as a child; Anemia; Head pain, chronic; Dizziness; and Hemiparesis and speech and language deficit as late effects of stroke (02/09/2015).  Presented with patient was seen and evaluated on the seventh with abdominal pain at that point she reported no bowel movement for 3 days and lower abdominal pain. CT scan of the abdomen was not attached time the only finding was slightly swirled appearance mesentery in the right mid up abdomen thought to be secondary to prior surgery. 2 months ago patient has undergone hysterectomy for right ovarian mass which was found to be right ovarian fibroma. Patient was found to have urinary tract infection and was discharged home on Keflex. Unfortunately urine culture was not obtained at that time.  Patient has refused to get up for the past 3 days from her bed and family brought her in. Patient was also endorsing chest pain lasting 30 minutes. History was related through the family as patient is nonverbal secondary to prior stroke April 2016.  Patient has been nonambulatory for the past few days.of note patient has had a negative cardiac catheterization 2002 Family reports no BM for the week or more. It is difficult to discern if  patient is confused since patient is aphasic. She does endorse having thoracic as well as lower lumbar pain that was sudden in onset and reports that that is a cause for her not being able to get up. Hospitalist was called for admission for UTI and confusion.  Hospital Course:  Acute encephalopathy likely due to UTI, also has chronic aphasia due to prior stroke  Patient has received IV  Rocephin during hospitalization and does not need any further course of antibiotics. She is much more alert, eating better, more conversive although she does have chronic aphasia  Atypical Chest pain - Troponins negative, ruled out for acute ACS - negative rib imaging, x-rays of the thoracic spine show kyphoscoliosis, compression deformity at T8 and T9, status post kyphoplasty, no acute thoracic fractures, lumbar scoliosis with degenerative changes.  - PT OT evaluation recommending CIR  UTI - Urine cultures so far negative. The patient has completed the course of IV Rocephin.  Recurrent CVA with residual right-sided weakness, chronic aphasia - cont eliquis   Essential hypertension  - continue home medications   Paroxysmal atrial fibrillation - Currently rate controlled, continue metoprolol and eliquis - CHADS vasc 4  Constipation - Continue bowel regimen   Conjunctivitis - Changed to tobramycin eyedrops for 5-7 days   Day of Discharge BP 113/61 mmHg  Pulse 93  Temp(Src) 98.4 F (36.9 C) (Oral)  Resp 16  Ht 5\' 3"  (1.6 m)  Wt 46.675 kg (102 lb 14.4 oz)  BMI 18.23 kg/m2  SpO2 100%  LMP 09/03/1996 (Approximate)  Physical Exam: General: Alert and awake oriented x3 not in any acute distress. HEENT: anicteric sclera, pupils reactive to light and accommodation CVS: S1-S2 clear no murmur rubs or gallops Chest: clear to auscultation bilaterally, no wheezing rales or rhonchi Abdomen: soft nontender, nondistended, normal bowel sounds Extremities: no cyanosis, clubbing or edema noted  bilaterally Neuro: Cranial nerves II-XII intact, no focal neurological deficits   The results of significant diagnostics from this hospitalization (including imaging, microbiology, ancillary and laboratory) are listed below for reference.    LAB RESULTS: Basic Metabolic Panel:  Recent Labs Lab 03/14/15 0642 03/16/15 0654  NA 135 135  K 3.7 3.9  CL 104 103  CO2 25 28  GLUCOSE 112* 115*  BUN 21* 20  CREATININE 0.83 0.89  CALCIUM 8.0* 8.2*  MG 1.9  --   PHOS 2.0*  --    Liver Function Tests:  Recent Labs Lab 03/10/15 1635 03/14/15 0642  AST 23 19  ALT 21 17  ALKPHOS 82 76  BILITOT 1.4* 0.7  PROT 6.9 5.1*  ALBUMIN 4.0 2.4*    Recent Labs Lab 03/10/15 1635  LIPASE 30   No results for input(s): AMMONIA in the last 168 hours. CBC:  Recent Labs Lab 03/10/15 1635  03/15/15 0420 03/16/15 0654  WBC 9.7  < > 8.9 8.3  NEUTROABS 7.1  --   --   --   HGB 14.7  < > 13.5 12.5  HCT 45.0  < > 39.4 37.4  MCV 93.6  < > 92.7 92.6  PLT 232  < > 266 255  < > = values in this interval not displayed. Cardiac Enzymes:  Recent Labs Lab 03/14/15 0642 03/14/15 1103  TROPONINI <0.03 <0.03   BNP: Invalid input(s): POCBNP CBG: No results for input(s): GLUCAP in the last 168 hours.  Significant Diagnostic Studies:  Dg Chest 2 View  03/14/2015   CLINICAL DATA:  Mid chest pain and dizziness.  EXAM: CHEST  2 VIEW  COMPARISON:  02/25/2015  FINDINGS: Hyperinflation consistent with emphysema. Normal heart size and pulmonary vascularity. Calcified granulomas in the right lung. Opacification of the left lung base posteriorly probably represents small pleural effusion. No focal consolidation in the lungs. No pneumothorax. Calcified and tortuous aorta. Scoliosis and degenerative changes in the thoracic spine with post kyphoplasty changes in the mid thoracic region.  IMPRESSION: Emphysematous changes.  Probable small left pleural effusion.   Electronically Signed   By: Lucienne Capers  M.D.   On: 03/14/2015 00:13   Dg Ribs Unilateral Left  03/14/2015   CLINICAL DATA:  Pain across the chest for 30 minutes.  EXAM: LEFT RIBS - 2 VIEW  COMPARISON:  Chest 03/13/2015  FINDINGS: Left ribs appear intact. No displaced fractures or focal bone lesions are identified.  IMPRESSION: No acute bony abnormalities.   Electronically Signed   By: Lucienne Capers M.D.   On: 03/14/2015 04:15   Dg Thoracic Spine 2 View  03/14/2015   CLINICAL DATA:  Pain across the chest for 30 minutes. Patient is nonverbal from previous stroke.  EXAM: THORACIC SPINE - 2-3 VIEWS  COMPARISON:  Chest 03/13/2015  FINDINGS: Thoracic scoliosis convex towards the right. Diffuse degenerative changes. Mid thoracic vertebral compression deformities post kyphoplasty. No anterior subluxation of the thoracic spine. There is evidence of mild anterior subluxation of C4 on C5. This is of uncertain chronicity. No prior comparison studies at this air found.  IMPRESSION: Thoracic kyphoscoliosis. Compression deformities at T8 and T9 post kyphoplasty. No acute thoracic fractures identified. There is anterior subluxation noted at C4-5 of nonspecific etiology.   Electronically Signed   By: Lucienne Capers M.D.   On: 03/14/2015 04:13   Dg Lumbar Spine 2-3 Views  03/14/2015   CLINICAL DATA:  Pain across the chest for 30 minutes.  Back pain.  EXAM: LUMBAR SPINE - 2-3 VIEW  COMPARISON:  CT abdomen and pelvis 03/10/2015  FINDINGS: Lumbar scoliosis convex towards the left. Diffuse degenerative changes with endplate hypertrophic changes throughout and mild disc space narrowing. Normal alignment of the lumbar spine. No vertebral compression deformities. No focal bone lesions identified. Surgical clips in the abdomen.  IMPRESSION: Lumbar scoliosis with degenerative changes. No acute displaced fractures identified.   Electronically Signed   By: Lucienne Capers M.D.   On: 03/14/2015 04:14   Dg Abd 1 View  03/14/2015   CLINICAL DATA:  Pain across the chest  for 30 minutes.  Constipation.  EXAM: ABDOMEN - 1 VIEW  COMPARISON:  CT abdomen and pelvis 03/10/2015  FINDINGS: Residual contrast material in the left colon. No small or large bowel distention. Surgical clips in the right abdomen. Degenerative changes in the spine and hips. No radiopaque stones. Vascular calcifications.  IMPRESSION: Residual contrast material in the colon. No evidence of bowel obstruction.   Electronically Signed   By: Lucienne Capers M.D.   On: 03/14/2015 04:16   Ct Head Wo Contrast  03/14/2015   CLINICAL DATA:  81 year old female with chest pain. Patient has history of prior CVA  EXAM: CT HEAD WITHOUT CONTRAST  TECHNIQUE: Contiguous axial images were obtained from the base of the skull through the vertex without intravenous contrast.  COMPARISON:  Head CT dated 02/18/2015 and MRI dated 02/26/2015  FINDINGS: The ventricles are dilated and the sulci are prominent compatible with age-related atrophy. Periventricular and deep white matter hypodensities represent chronic microvascular ischemic changes. Left frontoparietal as well as left occipital hypodensities correspond to the infarcts seen on the prior study. There is no intracranial hemorrhage. No mass effect or midline shift identified.  The visualized paranasal sinuses and mastoid air cells are well aerated. The calvarium is intact.  The rectum  IMPRESSION: No acute intracranial pathology.  Age-related atrophy and chronic microvascular ischemic disease. Progression of the left frontoparietal and left occipital infarcts with encephalomalacia changes in.  If symptoms persist and there are no contraindications, MRI  may provide better evaluation if clinically indicated.   Electronically Signed   By: Anner Crete M.D.   On: 03/14/2015 00:29    2D ECHO:   Disposition and Follow-up:    DISPOSITION: Inpatient rehabilitation DISCHARGE FOLLOW-UP Follow-up Information    Follow up with [name], MD. Schedule an appointment  as soon as possible for a visit in 2 weeks.   Specialty:  Internal Medicine   Why:  for hospital follow-up   Contact information:   [address] [place] [address] [phone]        Time spent on Discharge: 35 mins   Signed:   RAI,RIPUDEEP M.D. [facility] 03/17/2015, 1:29 PM Pager: [phone]

## 2015-03-17 NOTE — Progress Notes (Signed)
Retta Diones, RN Rehab Admission Coordinator Signed Physical Medicine and Rehabilitation PMR Pre-admission 03/17/2015 11:13 AM  Related encounter: ED to Hosp-Admission (Current) from 03/13/2015 in [place] Collapse All   PMR Admission Coordinator Pre-Admission Assessment  Patient: Linda [name] is an 81 y.o., female MRN: [id number] DOB: 07/23/1929 Height: 5\' 3"  (160 cm) Weight: 46.675 kg (102 lb 14.4 oz)  Insurance Information HMO: No PPO: PCP: IPA: 80/20: OTHER:  PRIMARY: Medicare A/B Policy#: [id number] Subscriber: Aundria Rud CM Name: Phone#: Fax#:  Pre-Cert#: Employer: Retired Benefits: Phone #: Name: Checked in [place]. Date: 09/04/95 Deduct: $1288 Out of Pocket Max: none Life Max: unlimited CIR: 100% SNF: Has 0 full and 57 copay SNF days left with LBD=02/20/15 Outpatient: 80% Co-Pay: 20% Home Health: 100% Co-Pay: none DME: 80% Co-Pay: 20% Providers: patient's choice  SECONDARY: AARP Policy#: [id number] Subscriber: [place] Name: Phone#: Fax#:  Pre-Cert#: Employer: Retired Benefits: Phone #: [phone] Name:  Eff. Date: Deduct: Out of Pocket Max: Life Max:  CIR: SNF:  Outpatient: Co-Pay:  Home Health: Co-Pay:  DME: Co-Pay:   Emergency Contact Information Contact Information    Name Relation Home Work Mobile   [place] Son [phone]   Jameisha, Stofko   [phone]   Hettie, Roselli   [phone]     Current Medical History  Patient Admitting Diagnosis: Subacute left PCA distribution infarct superimposed  on previous left MCA infarct causing aphasia as well as balance deficits   History of Present Illness: An 80 y.o. 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 She was readmitted on 03/14/15 with malaise and refusal to get out of bed as well as confusion. She was started on IV rocephin for recurrent UTI and has had improvement in mentation. X rays of lumbar and thoracic spine with diffuse degenerative changes. robaxin was scheduled to help with neck/back spasms. ST evaluation without evidence of dysphagia. PT evaluation done yesterday and CIR recommended for follow up therapy   Past Medical History  Past Medical History  Diagnosis Date  . History of cardiac catheterization 2002    Negative  . Vertebral fracture   . Restless legs syndrome with nocturnal myoclonus 05/13/2013    [place] controlled   . Abnormal uterine bleeding 6301,6010  . Hypothyroidism   . Arthritis   . Osteoporosis   . History of stomach ulcers   . History of jaundice as a child   . Anemia   . Head pain, chronic     "I have pain in the back of my head for months"  . Dizziness     TAKES MECLIZINE TO TREAT  . Hemiparesis and speech and language deficit as late effects of stroke 02/09/2015    Family History  family history includes Asthma in her brother; Cancer in her brother; Hypertension in her father; Osteoarthritis in her son; Stroke in her father.  Prior Rehab/Hospitalizations: Was at [facility] for a couple months after CVA in 04/16. Then had HH with [facility].  Has the patient had major surgery during 100 days prior to admission? No. However, did have surgery 11/02/14.  Current Medications   Current facility-administered medications:  . acetaminophen (TYLENOL)  tablet 650 mg, 650 mg, Oral, Q6H PRN, 650 mg at 03/15/15 0657 **OR** acetaminophen (  TYLENOL) suppository 650 mg, 650 mg, Rectal, Q6H PRN, Toy Baker, MD . apixaban (ELIQUIS) tablet 2.5 mg, 2.5 mg, Oral, BID, Reyne Dumas, MD, 2.5 mg at 03/17/15 0800 . cefTRIAXone (ROCEPHIN) 1 g in dextrose 5 % 50 mL IVPB, 1 g, Intravenous, Q24H, Toy Baker, MD, Last Rate: 100 mL/hr at 03/17/15 0759, 1 g at 03/17/15 0759 . docusate sodium (COLACE) capsule 100 mg, 100 mg, Oral, BID PRN, Toy Baker, MD . fentaNYL (SUBLIMAZE) injection 25 mcg, 25 mcg, Intravenous, Q2H PRN, Toy Baker, MD . levothyroxine (SYNTHROID, LEVOTHROID) tablet 50 mcg, 50 mcg, Oral, QAC breakfast, Toy Baker, MD, 50 mcg at 03/17/15 0759 . methocarbamol (ROBAXIN) tablet 500 mg, 500 mg, Oral, TID, Ripudeep K Rai, MD, 500 mg at 03/17/15 0801 . metoprolol tartrate (LOPRESSOR) tablet 25 mg, 25 mg, Oral, BID, Toy Baker, MD, 25 mg at 03/17/15 0801 . ondansetron (ZOFRAN) tablet 4 mg, 4 mg, Oral, Q6H PRN **OR** ondansetron (ZOFRAN) injection 4 mg, 4 mg, Intravenous, Q6H PRN, Toy Baker, MD . pantoprazole sodium (PROTONIX) 40 mg/20 mL oral suspension 40 mg, 40 mg, Oral, Daily PRN, Toy Baker, MD . polyvinyl alcohol (LIQUIFILM TEARS) 1.4 % ophthalmic solution 1 drop, 1 drop, Both Eyes, TID PRN, Ripudeep K Rai, MD . rOPINIRole (REQUIP) tablet 0.25 mg, 0.25 mg, Oral, TID, Toy Baker, MD, 0.25 mg at 03/17/15 0759 . senna-docusate (Senokot-S) tablet 2 tablet, 2 tablet, Oral, Daily, Toy Baker, MD, 2 tablet at 03/17/15 0800 . sertraline (ZOLOFT) tablet 50 mg, 50 mg, Oral, Daily, Toy Baker, MD, 50 mg at 03/17/15 0800 . sodium chloride 0.9 % injection 3 mL, 3 mL, Intravenous, Q12H, Toy Baker, MD, 3 mL at 03/17/15 0802 . tobramycin (TOBREX) 0.3 % ophthalmic ointment, , Both Eyes, QID, Ripudeep K Rai, MD . traMADol (ULTRAM) tablet 50 mg, 50 mg, Oral, Q6H  PRN, Toy Baker, MD, 50 mg at 03/16/15 9937  Patients Current Diet: Diet Heart Room service appropriate?: Yes with Assist; Fluid consistency:: Thin  Precautions / Restrictions Precautions Precautions: Fall Precaution Comments: telemetry Restrictions Weight Bearing Restrictions: No   Has the patient had 2 or more falls or a fall with injury in the past year?No  Prior Activity Level Limited Community (1-2x/wk): Has been less active and going out less since CVA in April. Son now takes patient out when needed.  Home Assistive Devices / Equipment Home Assistive Devices/Equipment: [name] (specify quad or straight) (UTA, pt typically uses cane) Home Equipment: Cane - single point, Environmental consultant - 2 wheels, Shower seat, Tub bench, Transport chair, [employer] - quad  Prior Device Use: Indicate devices/aids used by the patient prior to current illness, exacerbation or injury? Walker. Has also used a cane in the recent past.  Prior Functional Level Prior Function Level of Independence: Needs assistance Gait / Transfers Assistance Needed: Using cane/RW - assist for ambulation ADL's / Homemaking Assistance Needed: Assist for ADLs Comments: Previously ambulated with cane; still driving short distances  Self Care: Did the patient need help bathing, dressing, using the toilet or eating? Needed some help. Has needed assistance since stroke 04/16  Indoor Mobility: Did the patient need assistance with walking from room to room (with or without device)? Needed some help. Could walk short distances with her walker.  Stairs: Did the patient need assistance with internal or external stairs (with or without device)? Needed some help  Functional Cognition: Did the patient need help planning regular tasks such as shopping or remembering to take medications? Needed some help  Current Functional Level Cognition  Overall Cognitive Status: History of cognitive impairments - at baseline Orientation Level:  Oriented to person, Oriented to place, Other (comment) (Pt able to state name and answer yes and no questions ) General Comments: difficult to assess due to impaired communication   Extremity Assessment (includes Sensation/Coordination)  Upper Extremity Assessment: RUE deficits/detail, Generalized weakness RUE Deficits / Details: right grip strength weaker than left RUE Coordination: decreased fine motor  Lower Extremity Assessment: Defer to PT evaluation RLE Deficits / Details: Similar strength but weak B    ADLs  Overall ADL's : Needs assistance/impaired Eating/Feeding: Minimal assistance, Sitting Eating/Feeding Details (indicate cue type and reason): OT assisted in getting eggs on fork Lower Body Dressing: Moderate assistance, Sit to/from stand, Sitting/lateral leans Toilet Transfer: Moderate assistance, Stand-pivot, RW (from bed to chair) Functional mobility during ADLs: Moderate assistance, Rolling walker (stand pivot) General ADL Comments: Pt able to don/doff socks sitting EOB.     Mobility  Overal bed mobility: Needs Assistance Bed Mobility: Supine to Sit Supine to sit: Supervision Sit to supine: Mod assist General bed mobility comments: pt relatively weak to lift LE's onto bed    Transfers  Overall transfer level: Needs assistance Equipment used: Rolling walker (2 wheeled), 1 person hand held assist Transfers: Sit to/from Stand, Stand Pivot Transfers Sit to Stand: Mod assist Stand pivot transfers: Mod assist General transfer comment: cues for hand placement, but pt still pulled up on walker.     Ambulation / Gait / Stairs / Wheelchair Mobility  Ambulation/Gait Ambulation/Gait assistance: Min assist, Mod assist Ambulation Distance (Feet): 5 Feet Assistive device: 1 person hand held assist, Rolling walker (2 wheeled) Gait Pattern/deviations: Step-to pattern, Shuffle, Wide base of support, Trunk flexed General Gait Details: Pt sidestepped up bed due to  frequent sitting once up Gait velocity: Decreased Gait velocity interpretation: Below normal speed for age/gender    Posture / Balance Balance Overall balance assessment: Needs assistance Sitting-balance support: Feet supported Sitting balance-Leahy Scale: Fair Standing balance support: Bilateral upper extremity supported Standing balance-Leahy Scale: Poor    Special needs/care consideration BiPAP/CPAP No CPM No Continuous Drip IV No Dialysis No  Life Vest No Oxygen No Special Bed No Trach Size No Wound Vac (area) No  Skin No  Bowel mgmt: Last BM 03/15/15 Bladder mgmt: Incontinent of urine at times Diabetic mgmt No    Previous Home Environment Living Arrangements: Children Available Help at Discharge: Family, Friend(s), Available PRN/intermittently Type of Home: House Home Layout: Two level, Laundry or work area in basement, Able to live on main level with bedroom/bathroom Home Access: Stairs to enter Entrance Stairs-Rails: Left Entrance Stairs-Number of Steps: 4 Home Care Services: Yes Type of Home Care Services: Biomedical scientist, [place] (if known): [facility]  Discharge Living Setting Plans for Discharge Living Setting: Patient's home, House, Lives with (comment) (Lives with son, Legrand Como.) Type of Home at Discharge: House Discharge Home Layout: Two level, Laundry or work area in basement, Able to live on main level with bedroom/bathroom Alternate Level Stairs-Number of Steps: Flight Discharge Home Access: Stairs to enter [employer] of Steps: 5 steps at front and 8 steps at side entry. Does the patient have any problems obtaining your medications?: No  Social/Family/Support Systems Patient Roles: Parent (Has 3 sons.) Contact Information: Keiera Strathman - (h) [phone] (c) [phone] Anticipated Caregiver: Sons and hired caregivers Ability/Limitations of Caregiver: Sons work,  has caregivers Caregiver Availability: Other (Comment) (Has caregiver while son works.) Discharge Plan Discussed with Primary Caregiver: Yes  Is Caregiver In Agreement with Plan?: Yes Does Caregiver/Family have Issues with Lodging/Transportation while Pt is in Rehab?: No  Goals/Additional Needs Patient/Family Goal for Rehab: PT/OT/ST supervision goals Expected length of stay: 9-12 days Cultural Considerations: [employer] Dietary Needs: Heart, soft, thin liquids diet Equipment Needs: TBD Pt/Family Agrees to Admission and willing to participate: Yes Program Orientation Provided & Reviewed with Pt/Caregiver Including Roles & Responsibilities: Yes  Decrease burden of Care through IP rehab admission: N/A  Possible need for SNF placement upon discharge: Not planned  Patient Condition: This patient's condition remains as documented in the consult dated 03/16/15, in which the Rehabilitation Physician determined and documented that the patient's condition is appropriate for intensive rehabilitative care in an inpatient rehabilitation facility. Will admit to inpatient rehab today.  Preadmission Screen Completed By: Retta Diones, 03/17/2015 11:35 AM ______________________________________________________________________  Discussed status with Dr. Letta Pate on 03/17/15 at 1134 and received telephone approval for admission today.  Admission Coordinator: Retta Diones, time1134/Date07/14/16          Cosigned by: [name], MD at 03/17/2015 11:57 AM  Revision History     Date/Time User Provider Type Action   03/17/2015 11:57 AM [name], MD Physician Cosign   03/17/2015 11:35 AM Retta Diones, RN Rehab Admission Coordinator Sign

## 2015-03-17 NOTE — PMR Pre-admission (Signed)
PMR Admission Coordinator Pre-Admission Assessment  Patient: Linda [name] is an 81 y.o., female MRN: [id number] DOB: 1929/06/06 Height: 5\' 3"  (160 cm) Weight: 46.675 kg (102 lb 14.4 oz)              Insurance Information HMO: No    PPO:       PCP:       IPA:       80/20:       OTHER:   PRIMARY: Medicare A/B      Policy#: [id number]      Subscriber: [place] Name:        Phone#:       Fax#:   Pre-Cert#:        Employer: Retired Benefits:  Phone #:       Name: Checked in [place]. Date: 09/04/95     Deduct: $1288      Out of Pocket Max: none      Life Max: unlimited CIR: 100%      SNF: Has 0 full and 57 copay SNF days left with LBD=02/20/15 Outpatient: 80%     Co-Pay: 20% Home Health: 100%      Co-Pay: none DME: 80%     Co-Pay: 20% Providers: patient's choice  SECONDARY: A[employer]      Policy#: [id number]      Subscriber: [place] Name:        Phone#:       Fax#:   Pre-Cert#:        Employer: Retired Benefits:  Phone #: [phone]     Name:   Eff. Date:       Deduct:        Out of Pocket Max:        Life Max:   CIR:        SNF:   Outpatient:       Co-Pay:   Home Health:        Co-Pay:   DME:       Co-Pay:    Emergency Contact Information Contact Information    Name Relation Home Work Mobile   [place] Son [phone]   Morayma, [place]   [phone]   Ardella, Chhim   [phone]     Current Medical History  Patient Admitting Diagnosis: Subacute left PCA distribution infarct superimposed on previous left MCA infarct causing aphasia as well as balance deficits    History of Present Illness: An 81 y.o. 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 She was readmitted on 03/14/15 with malaise and refusal to get out of bed as well as  confusion. She was started on IV rocephin for recurrent UTI and has had improvement in mentation. X rays of lumbar and thoracic spine with diffuse degenerative changes. robaxin was scheduled to help with neck/back spasms. ST evaluation without evidence of dysphagia. PT evaluation done yesterday and CIR recommended for follow up therapy     Past Medical History  Past Medical History  Diagnosis Date  . History of cardiac catheterization 2002    Negative  . Vertebral fracture   . Restless legs syndrome with nocturnal myoclonus 05/13/2013     [place] controlled   . Abnormal uterine bleeding 6629,4765  . Hypothyroidism   . Arthritis   . Osteoporosis   . History  of stomach ulcers   . History of jaundice as a child   . Anemia   . Head pain, chronic     "I have pain in the back of my head for months"  . Dizziness     TAKES MECLIZINE TO TREAT  . Hemiparesis and speech and language deficit as late effects of stroke 02/09/2015    Family History  family history includes Asthma in her brother; Cancer in her brother; Hypertension in her father; Osteoarthritis in her son; Stroke in her father.  Prior Rehab/Hospitalizations: Was at [facility] for a couple months after CVA in 04/16.  Then had HH with [facility].  Has the patient had major surgery during 100 days prior to admission? No.  However, did have surgery 11/02/14.  Current Medications   Current facility-administered medications:  .  acetaminophen (TYLENOL) tablet 650 mg, 650 mg, Oral, Q6H PRN, 650 mg at 03/15/15 0657 **OR** acetaminophen (TYLENOL) suppository 650 mg, 650 mg, Rectal, Q6H PRN, Toy Baker, MD .  apixaban (ELIQUIS) tablet 2.5 mg, 2.5 mg, Oral, BID, Reyne Dumas, MD, 2.5 mg at 03/17/15 0800 .  cefTRIAXone (ROCEPHIN) 1 g in dextrose 5 % 50 mL IVPB, 1 g, Intravenous, Q24H, Toy Baker, MD, Last Rate: 100 mL/hr at 03/17/15 0759, 1 g at 03/17/15 0759 .  docusate sodium (COLACE) capsule 100 mg, 100 mg, Oral, BID PRN,  Toy Baker, MD .  fentaNYL (SUBLIMAZE) injection 25 mcg, 25 mcg, Intravenous, Q2H PRN, Toy Baker, MD .  levothyroxine (SYNTHROID, LEVOTHROID) tablet 50 mcg, 50 mcg, Oral, QAC breakfast, Toy Baker, MD, 50 mcg at 03/17/15 0759 .  methocarbamol (ROBAXIN) tablet 500 mg, 500 mg, Oral, TID, Ripudeep K Rai, MD, 500 mg at 03/17/15 0801 .  metoprolol tartrate (LOPRESSOR) tablet 25 mg, 25 mg, Oral, BID, Toy Baker, MD, 25 mg at 03/17/15 0801 .  ondansetron (ZOFRAN) tablet 4 mg, 4 mg, Oral, Q6H PRN **OR** ondansetron (ZOFRAN) injection 4 mg, 4 mg, Intravenous, Q6H PRN, Toy Baker, MD .  pantoprazole sodium (PROTONIX) 40 mg/20 mL oral suspension 40 mg, 40 mg, Oral, Daily PRN, Toy Baker, MD .  polyvinyl alcohol (LIQUIFILM TEARS) 1.4 % ophthalmic solution 1 drop, 1 drop, Both Eyes, TID PRN, Ripudeep K Rai, MD .  rOPINIRole (REQUIP) tablet 0.25 mg, 0.25 mg, Oral, TID, Toy Baker, MD, 0.25 mg at 03/17/15 0759 .  senna-docusate (Senokot-S) tablet 2 tablet, 2 tablet, Oral, Daily, Toy Baker, MD, 2 tablet at 03/17/15 0800 .  sertraline (ZOLOFT) tablet 50 mg, 50 mg, Oral, Daily, Toy Baker, MD, 50 mg at 03/17/15 0800 .  sodium chloride 0.9 % injection 3 mL, 3 mL, Intravenous, Q12H, Toy Baker, MD, 3 mL at 03/17/15 0802 .  tobramycin (TOBREX) 0.3 % ophthalmic ointment, , Both Eyes, QID, Ripudeep K Rai, MD .  traMADol (ULTRAM) tablet 50 mg, 50 mg, Oral, Q6H PRN, Toy Baker, MD, 50 mg at 03/16/15 2542  Patients Current Diet: Diet Heart Room service appropriate?: Yes with Assist; Fluid consistency:: Thin  Precautions / Restrictions Precautions Precautions: Fall Precaution Comments: telemetry Restrictions Weight Bearing Restrictions: No   Has the patient had 2 or more falls or a fall with injury in the past year?No  Prior Activity Level Limited Community (1-2x/wk): Has been less active and going out less since CVA in April.   Son now takes patient out when needed.  Home Assistive Devices / Equipment Home Assistive Devices/Equipment: Cane (specify quad or straight) (UTA, pt typically uses cane) Home Equipment: Cane - single  point, Walker - 2 wheels, Shower seat, Tub bench, Transport chair, [employer] - quad  Prior Device Use: Indicate devices/aids used by the patient prior to current illness, exacerbation or injury? Walker.  Has also used a cane in the recent past.  Prior Functional Level Prior Function Level of Independence: Needs assistance Gait / Transfers Assistance Needed: Using cane/RW - assist for ambulation ADL's / Homemaking Assistance Needed: Assist for ADLs Comments: Previously ambulated with cane; still driving short distances  Self Care: Did the patient need help bathing, dressing, using the toilet or eating?  Needed some help.  Has needed assistance since stroke 04/16  Indoor Mobility: Did the patient need assistance with walking from room to room (with or without device)? Needed some help.  Could walk short distances with her walker.  Stairs: Did the patient need assistance with internal or external stairs (with or without device)? Needed some help  Functional Cognition: Did the patient need help planning regular tasks such as shopping or remembering to take medications? Needed some help  Current Functional Level Cognition  Overall Cognitive Status: History of cognitive impairments - at baseline Orientation Level: Oriented to person, Oriented to place, Other (comment) (Pt able to state name and answer yes and no questions ) General Comments: difficult to assess due to impaired communication    Extremity Assessment (includes Sensation/Coordination)  Upper Extremity Assessment: RUE deficits/detail, Generalized weakness RUE Deficits / Details: right grip strength weaker than left RUE Coordination: decreased fine motor  Lower Extremity Assessment: Defer to PT evaluation RLE Deficits / Details:  Similar strength but weak B    ADLs  Overall ADL's : Needs assistance/impaired Eating/Feeding: Minimal assistance, Sitting Eating/Feeding Details (indicate cue type and reason): OT assisted in getting eggs on fork Lower Body Dressing: Moderate assistance, Sit to/from stand, Sitting/lateral leans Toilet Transfer: Moderate assistance, Stand-pivot, RW (from bed to chair) Functional mobility during ADLs: Moderate assistance, Rolling walker (stand pivot) General ADL Comments: Pt able to don/doff socks sitting EOB.     Mobility  Overal bed mobility: Needs Assistance Bed Mobility: Supine to Sit Supine to sit: Supervision Sit to supine: Mod assist General bed mobility comments: pt relatively weak to lift LE's onto bed    Transfers  Overall transfer level: Needs assistance Equipment used: Rolling walker (2 wheeled), 1 person hand held assist Transfers: Sit to/from Stand, Stand Pivot Transfers Sit to Stand: Mod assist Stand pivot transfers: Mod assist General transfer comment: cues for hand placement, but pt still pulled up on walker.     Ambulation / Gait / Stairs / Wheelchair Mobility  Ambulation/Gait Ambulation/Gait assistance: Min assist, Mod assist Ambulation Distance (Feet): 5 Feet Assistive device: 1 person hand held assist, Rolling walker (2 wheeled) Gait Pattern/deviations: Step-to pattern, Shuffle, Wide base of support, Trunk flexed General Gait Details: Pt sidestepped up bed due to frequent sitting once up Gait velocity: Decreased Gait velocity interpretation: Below normal speed for age/gender    Posture / Balance Balance Overall balance assessment: Needs assistance Sitting-balance support: Feet supported Sitting balance-Leahy Scale: Fair Standing balance support: Bilateral upper extremity supported Standing balance-Leahy Scale: Poor    Special needs/care consideration BiPAP/CPAP No CPM No Continuous Drip IV No Dialysis No        Life Vest No Oxygen No Special Bed  No Trach Size No Wound Vac (area) No       Skin No  Bowel mgmt: Last BM 03/15/15 Bladder mgmt: Incontinent of urine at times Diabetic mgmt No    Previous Home Environment Living Arrangements: Children Available Help at Discharge: Family, Friend(s), Available PRN/intermittently Type of Home: House Home Layout: Two level, Laundry or work area in basement, Able to live on main level with bedroom/bathroom Home Access: Stairs to enter Entrance Stairs-Rails: Left Entrance Stairs-Number of Steps: [place]: Yes Type of Home Care Services: Biomedical scientist, [place] (if known): [facility]  Discharge Living Setting Plans for Discharge Living Setting: Patient's home, House, Lives with (comment) (Lives with son, Legrand Como.) Type of Home at Discharge: House Discharge Home Layout: Two level, Laundry or work area in basement, Able to live on main level with bedroom/bathroom Alternate Level Stairs-Number of Steps: Flight Discharge Home Access: Stairs to enter [employer] of Steps: 5 steps at front and 8 steps at side entry. Does the patient have any problems obtaining your medications?: No  Social/Family/Support Systems Patient Roles: Parent (Has 3 sons.) Contact Information: Khady Vandenberg - (h) [phone] (c) [phone] Anticipated Caregiver: Sons and hired caregivers Ability/Limitations of Caregiver: Sons work, has caregivers Caregiver Availability: Other (Comment) (Has caregiver while son works.) Discharge Plan Discussed with Primary Caregiver: Yes Is Caregiver In Agreement with Plan?: Yes Does Caregiver/Family have Issues with Lodging/Transportation while Pt is in Rehab?: No  Goals/Additional Needs Patient/Family Goal for Rehab: PT/OT/ST supervision goals Expected length of stay: 9-12 days Cultural Considerations: [employer] Dietary Needs: Heart, soft, thin liquids diet Equipment Needs: TBD Pt/Family Agrees to  Admission and willing to participate: Yes Program Orientation Provided & Reviewed with Pt/Caregiver Including Roles  & Responsibilities: Yes  Decrease burden of Care through IP rehab admission: N/A  Possible need for SNF placement upon discharge: Not planned  Patient Condition: This patient's condition remains as documented in the consult dated 03/16/15, in which the Rehabilitation Physician determined and documented that the patient's condition is appropriate for intensive rehabilitative care in an inpatient rehabilitation facility. Will admit to inpatient rehab today.  Preadmission Screen Completed By:  Retta Diones, 03/17/2015 11:35 AM ______________________________________________________________________   Discussed status with Dr. Letta Pate on 03/17/15 at 1134 and received telephone approval for admission today.  Admission Coordinator:  Retta Diones, time1134/Date07/14/16

## 2015-03-17 NOTE — H&P (Signed)
Physical Medicine and Rehabilitation Admission H&P   Chief Complaint  Patient presents with  . Encephalopathy.    HPI: Linda [name] is a 81 y.o. 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 She was readmitted on 03/14/15 with malaise and refusal to get out of bed as well as confusion. She was started on IV rocephin for recurrent UTI and has had improvement in mentation. X rays of lumbar and thoracic spine with diffuse degenerative changes. robaxin was scheduled to help with neck/back spasms. ST evaluation without evidence of dysphagia. PT evaluation done yesterday and CIR recommended for follow up therapy  Patient with limited verbal output, smiles and is pleasant, cooperative with examination  Review of Systems  Unable to perform ROS: language , Severe aphasia Musculoskeletal: Positive for myalgias and joint pain.  Psychiatric/Behavioral: The patient is nervous/anxious.      Past Medical History  Diagnosis Date  . History of cardiac catheterization 2002    Negative  . Vertebral fracture   . Restless legs syndrome with nocturnal myoclonus 05/13/2013    [place] controlled   . Abnormal uterine bleeding [id number]  . Hypothyroidism   . Arthritis   . Osteoporosis   . History of stomach ulcers   . History of jaundice as a child   . Anemia   . Head pain, chronic     "I have pain in the back of my head for months"  . Dizziness     TAKES MECLIZINE TO TREAT  . Hemiparesis and speech and language deficit as late effects of stroke 02/09/2015    Past Surgical History  Procedure Laterality Date  . Back surgery      x 3  . Breast surgery Bilateral     1 lump each breast removed and benign  . Nasal sinus  surgery    . Dilation and curettage of uterus  1970  . Catheterization  2003    cardiac cath  . Kyphoplasty  2010  . Cataract extraction Bilateral 2011  . Cholecystectomy  1995    Lap  . Cardiac catheterization  2002    "it was normal" per pt - no record found  . Robotic assisted salpingo oopherectomy Bilateral 11/02/2014    Procedure: ROBOTIC ASSISTED bilateral SALPINGO OOPHORECTOMY; Surgeon: Everitt Amber, MD; Location: [facility]; Service: Gynecology; Laterality: Bilateral;  . Laparoscopy N/A 11/02/2014    Procedure: LAPAROSCOPY DIAGNOSTIC; Surgeon: Everitt Amber, MD; Location: [facility]; Service: Gynecology; Laterality: N/A;  . Abdominal hysterectomy      Family History  Problem Relation Age of Onset  . Asthma Brother   . Osteoarthritis Son   . Hypertension Father   . Stroke Father   . Cancer Brother     stomach     Social History: Was independent prior to 12/2014. Lives with son who provides supervision. Able to ambulate with walker PTA? Per reports that she has never smoked. She has never used smokeless tobacco. Per reports that she does not drink alcohol or use illicit drugs.   Allergies  Allergen Reactions  . Ibuprofen Anaphylaxis    Passed out and had to be hospitalized  . Penicillins     Skin on head was crawling and very very sleepy  . Demerol [Meperidine Hcl] Nausea And Vomiting  . Dilaudid [Hydromorphone Hcl] Nausea And Vomiting  . Fentanyl Other (  See Comments)    unknown  . Lidoderm [Lidocaine] Other (See Comments)    unknown  . Lyrica [Pregabalin]     "made me a zombie'  . Morphine And Related Nausea And Vomiting  . Other     hamburger  . Percocet [Oxycodone-Acetaminophen] Nausea And Vomiting  . Tramadol Other (See Comments)    unknown  . Vicodin [Hydrocodone-Acetaminophen] Nausea And Vomiting    Medications  Prior to Admission  Medication Sig Dispense Refill  . acetaminophen (TYLENOL) 500 MG tablet Take 1,000 mg by mouth every 6 (six) hours as needed for mild pain. Usually only takes 2 x a day    . apixaban (ELIQUIS) 5 MG TABS tablet Take 1 tablet (5 mg total) by mouth 2 (two) times daily. Please discontinue Aspirin once started on Eliquis! 60 tablet 3  . calcium gluconate 500 MG tablet Take 500 mg by mouth 2 (two) times daily. With 1000 units D3    . cephALEXin (KEFLEX) 500 MG capsule Take 1 capsule (500 mg total) by mouth 3 (three) times daily. 21 capsule 0  . cholecalciferol (VITAMIN D) 1000 UNITS tablet Take 1,000 Units by mouth 2 (two) times daily. Take with calium    . Cranberry 200 MG CAPS Take 1 capsule by mouth daily with lunch.     . denosumab (PROLIA) 60 MG/ML SOLN injection Inject 60 mg into the skin every 6 (six) months. Administer in upper arm, thigh, or abdomen    . docusate sodium (COLACE) 100 MG capsule Take 100 mg by mouth 2 (two) times daily as needed for mild constipation or moderate constipation.    . Flaxseed, Linseed, (FLAX SEED OIL) 1000 MG CAPS Take 1 capsule by mouth 2 (two) times daily.     . hyoscyamine (ANASPAZ) 0.125 MG TBDP disintergrating tablet Place 0.125 mg under the tongue 2 (two) times daily as needed for bladder spasms.    Marland Kitchen levothyroxine (SYNTHROID, LEVOTHROID) 50 MCG tablet Take 50 mcg by mouth every morning.     . meclizine (ANTIVERT) 25 MG tablet Take 25 mg by mouth daily as needed for dizziness.     . metoprolol tartrate (LOPRESSOR) 25 MG tablet Take 25 mg by mouth 2 (two) times daily.    . Multiple Vitamins-Minerals (CENTRUM SILVER PO) Take 1 tablet by mouth daily with lunch.     . ondansetron (ZOFRAN) 4 MG tablet Take 1 tablet (4 mg total) by mouth every 8 (eight) hours as needed for nausea or vomiting. 20 tablet 0  . pantoprazole sodium (PROTONIX) 40 mg/20 mL PACK Take 20 mLs  (40 mg total) by mouth daily. (Patient taking differently: Take 40 mg by mouth daily as needed (acid reflux, heartburn). ) 30 each 4  . Propylene Glycol (SYSTANE BALANCE OP) Apply 1 drop to eye 2 (two) times daily.    Marland Kitchen rOPINIRole (REQUIP) 0.25 MG tablet TAKE 1 TABLET BY MOUTH 3 TIMES A DAY 270 tablet 2  . sennosides-docusate sodium (SENOKOT-S) 8.6-50 MG tablet Take 2 tablets by mouth daily.    . sertraline (ZOLOFT) 50 MG tablet Take 50 mg by mouth daily.    . traMADol (ULTRAM) 50 MG tablet Take 50 mg by mouth every 6 (six) hours as needed for severe pain.      Home: Home Living Family/patient expects to be discharged to:: Private residence Living Arrangements: Children Available Help at Discharge: Family, Friend(s), Available PRN/intermittently Type of Home: House Home Access: Stairs to enter [employer] of Steps: 4 Entrance Stairs-Rails: Left  Home Layout: Two level, Laundry or work area in basement, Able to live on main level with bedroom/bathroom Home Equipment: [name] - single point, Environmental consultant - 2 wheels, Shower seat, Tub bench, Systems analyst, [employer] - quad  Functional History: Prior Function Level of Independence: Needs assistance Gait / Transfers Assistance Needed: Using cane/RW - assist for ambulation ADL's / Homemaking Assistance Needed: Assist for ADLs Comments: Previously ambulated with cane; still driving short distances  Functional Status:  Mobility: Bed Mobility Overal bed mobility: Needs Assistance Bed Mobility: Supine to Sit Supine to sit: Supervision Sit to supine: Mod assist General bed mobility comments: pt relatively weak to lift LE's onto bed Transfers Overall transfer level: Needs assistance Equipment used: Rolling walker (2 wheeled), 1 person hand held assist Transfers: Sit to/from Stand, Stand Pivot Transfers Sit to Stand: Mod assist Stand pivot transfers: Mod assist General transfer comment: cues for hand placement,  but pt still pulled up on walker.  Ambulation/Gait Ambulation/Gait assistance: Min assist, Mod assist Ambulation Distance (Feet): 5 Feet Assistive device: 1 person hand held assist, Rolling walker (2 wheeled) Gait Pattern/deviations: Step-to pattern, Shuffle, Wide base of support, Trunk flexed General Gait Details: Pt sidestepped up bed due to frequent sitting once up Gait velocity: Decreased Gait velocity interpretation: Below normal speed for age/gender    ADL: ADL Overall ADL's : Needs assistance/impaired Eating/Feeding: Minimal assistance, Sitting Eating/Feeding Details (indicate cue type and reason): OT assisted in getting eggs on fork Lower Body Dressing: Moderate assistance, Sit to/from stand, Sitting/lateral leans Toilet Transfer: Moderate assistance, Stand-pivot, RW (from bed to chair) Functional mobility during ADLs: Moderate assistance, Rolling walker (stand pivot) General ADL Comments: Pt able to don/doff socks sitting EOB.   Cognition: Cognition Overall Cognitive Status: History of cognitive impairments - at baseline Orientation Level: Oriented to person, Oriented to place, Other (comment) (Pt able to state name and answer yes and no questions ) Cognition Arousal/Alertness: Awake/alert Behavior During Therapy: WFL for tasks assessed/performed, Anxious Overall Cognitive Status: History of cognitive impairments - at baseline General Comments: difficult to assess due to impaired communication    Blood pressure 113/61, pulse 93, temperature 98.4 F (36.9 C), temperature source Oral, resp. rate 16, height '5\' 3"'  (1.6 m), weight 46.675 kg (102 lb 14.4 oz), last menstrual period 09/03/1996, SpO2 100 %. Physical Exam  Nursing note and vitals reviewed. Constitutional: She appears well-developed.  Thin frail appearing elderly female  HENT:  Head: Normocephalic and atraumatic.  Eyes: Pupils are equal, round, and reactive to light. Right conjunctiva is injected. Left  conjunctiva is injected.  Crusty drainage on eyelids.  Neck: Normal range of motion. Neck supple. No tracheal deviation present. No thyromegaly present.  Cardiovascular: Normal rate. An irregular rhythm present.  Respiratory: Effort normal and breath sounds normal. No respiratory distress. She has no wheezes.  GI: Soft. Bowel sounds are normal. She exhibits no distension. There is no tenderness.  Musculoskeletal: She exhibits no edema.  Left flank and paraspinal hypersensitive to touch.  Neurological: She is alert. A cranial nerve deficit is present.  Left facial weakness. Able to follow simple motor commands. Verbal output limited to Yes or No. Motor strength is 4/5 bilateral deltoid, biceps, triceps, grip, hip flexor, knee extensor, ankle dorsiflexor and plantar flexor.  Sensation unable to assess secondary to aphasia with probable speech apraxia   Skin: Skin is warm and dry. No rash noted. No erythema.       Lab Results Last 48 Hours    Results for orders placed or performed  during the hospital encounter of 03/13/15 (from the past 48 hour(s))  CBC Status: None   Collection Time: 03/16/15 6:54 AM  Result Value Ref Range   WBC 8.3 4.0 - 10.5 K/uL   RBC 4.04 3.87 - 5.11 MIL/uL   Hemoglobin 12.5 12.0 - 15.0 g/dL   HCT 37.4 36.0 - 46.0 %   MCV 92.6 78.0 - 100.0 fL   MCH 30.9 26.0 - 34.0 pg   MCHC 33.4 30.0 - 36.0 g/dL   RDW 13.0 11.5 - 15.5 %   Platelets 255 150 - 400 K/uL  Basic metabolic panel Status: Abnormal   Collection Time: 03/16/15 6:54 AM  Result Value Ref Range   Sodium 135 135 - 145 mmol/L   Potassium 3.9 3.5 - 5.1 mmol/L   Chloride 103 101 - 111 mmol/L   CO2 28 22 - 32 mmol/L   Glucose, Bld 115 (H) 65 - 99 mg/dL   BUN 20 6 - 20 mg/dL   Creatinine, Ser 0.89 0.44 - 1.00 mg/dL   Calcium 8.2 (L) 8.9 - 10.3 mg/dL   GFR calc non Af Amer 58 (L) >60 mL/min   GFR calc Af  Amer >60 >60 mL/min    Comment: (NOTE) The eGFR has been calculated using the CKD EPI equation. This calculation has not been validated in all clinical situations. eGFR's persistently <60 mL/min signify possible Chronic Kidney Disease.    Anion gap 4 (L) 5 - 15      Imaging Results (Last 48 hours)    No results found.       Medical Problem List and Plan: 1. Functional deficits secondary to Subacute left PCA distribution infarct superimposed on previous left MCA infarct causing aphasia as well as balance deficits 2. A fib/DVT Prophylaxis/Anticoagulation: Pharmaceutical: Other (comment)--Eliquis 3. Pain Management: Continue scheduled robaxin for chronic neck pain. Will also schedule tylenol and add Sportscreme to help with pain control. 4. Mood: LCSW to follow and assist patient and family with issues.  5. Neuropsych: This patient is not capable of making decisions on her own behalf. 6. Skin/Wound Care: Routine pressure relief measures. Elevated feet when in bed.  7. Fluids/Electrolytes/Nutrition: Monitor I/O. Offer supplements between meals.  8. A fib: Monitor heart rate bid. Continue lopressor bid.  9. UTI: Urine culture negative--completed Day #4 ceftriaxone. Will d/c antibiotics and monitor. Push po fluids.  10. Conjunctivitis: Warm moist compresses with Tobradex eye drops qid.  11. RLS: continue Requip tid.      Post Admission Physician Evaluation: 1. Functional deficits secondary to Subacute left PCA distribution infarct superimposed on previous left MCA infarct causing aphasia as well as balance deficits 2. Patient is admitted to receive collaborative, interdisciplinary care between the physiatrist, rehab nursing staff, and therapy team. 3. Patient's level of medical complexity and substantial therapy needs in context of that medical necessity cannot be provided at a lesser intensity of care such as a SNF. 4. Patient has experienced substantial functional  loss from his/her baseline which was documented above under the "Functional History" and "Functional Status" headings. Judging by the patient's diagnosis, physical exam, and functional history, the patient has potential for functional progress which will result in measurable gains while on inpatient rehab. These gains will be of substantial and practical use upon discharge in facilitating mobility and self-care at the household level. 5. Physiatrist will provide 24 hour management of medical needs as well as oversight of the therapy plan/treatment and provide guidance as appropriate regarding the interaction of the two.  6. 24 hour rehab nursing will assist with bladder management, bowel management, safety, skin/wound care, disease management, medication administration and patient education and help integrate therapy concepts, techniques,education, etc. 7. PT will assess and treat for/with: pre gait, gait training, endurance , safety, equipment, neuromuscular re education. Goals are: sup . 8. OT will assess and treat for/with: ADLs, Cognitive perceptual skills, Neuromuscular re education, safety, endurance, equipment. Goals are: Mod I. Therapy may proceed with showering this patient. 9. SLP will assess and treat for/with: A aphasia, apraxia of speech. Goals are: Express basic needs using verbal and nonverbal techniques. 10. Case Management and Social Worker will assess and treat for psychological issues and discharge planning. 11. Team conference will be held weekly to assess progress toward goals and to determine barriers to discharge. 12. Patient will receive at least 3 hours of therapy per day at least 5 days per week. 13. ELOS: 9-12 days  14. Prognosis: good     [name] M.D. [facility] FAAPM&R (Sports Med, Neuromuscular Med) Diplomate Am Board of Electrodiagnostic Med  03/17/2015

## 2015-03-17 NOTE — Care Management Note (Signed)
Case Management Note  Patient Details  Name: JUNIPER COBEY MRN: [id number] Date of Birth: 06-28-1929  Subjective/Objective:  Pt admitted for Acute encephalopathy likely due to UTI, also has chronic aphasia.                  Action/Plan: Pt is planned for d/c today to CIR. No further needs from CM at this time.    Expected Discharge Date:                  Expected Discharge Plan:  [place]  In-House Referral:     Discharge planning Services  CM Consult  Post Acute Care Choice:    Choice offered to:     DME Arranged:    DME Agency:     HH Arranged:    [place] Agency:     Status of Service:  Completed, signed off  Medicare Important Message Given:  Yes-second notification given Date Medicare IM Given:    Medicare IM give by:    Date Additional Medicare IM Given:    Additional Medicare Important Message give by:     If discussed at Central of Stay Meetings, dates discussed:    Additional Comments:  Bethena Roys, RN 03/17/2015, 10:55 AM

## 2015-03-18 ENCOUNTER — Inpatient Hospital Stay (HOSPITAL_COMMUNITY): Payer: Medicare Other | Admitting: *Deleted

## 2015-03-18 ENCOUNTER — Inpatient Hospital Stay (HOSPITAL_COMMUNITY): Payer: Medicare Other | Admitting: Occupational Therapy

## 2015-03-18 ENCOUNTER — Inpatient Hospital Stay (HOSPITAL_COMMUNITY): Payer: Medicare Other | Admitting: Speech Pathology

## 2015-03-18 DIAGNOSIS — I6930 Unspecified sequelae of cerebral infarction: Secondary | ICD-10-CM

## 2015-03-18 DIAGNOSIS — I69320 Aphasia following cerebral infarction: Principal | ICD-10-CM

## 2015-03-18 DIAGNOSIS — R482 Apraxia: Secondary | ICD-10-CM

## 2015-03-18 DIAGNOSIS — I69398 Other sequelae of cerebral infarction: Secondary | ICD-10-CM

## 2015-03-18 DIAGNOSIS — R269 Unspecified abnormalities of gait and mobility: Secondary | ICD-10-CM

## 2015-03-18 LAB — COMPREHENSIVE METABOLIC PANEL
ALT: 28 U/L (ref 14–54)
AST: 28 U/L (ref 15–41)
Albumin: 2.6 g/dL — ABNORMAL LOW (ref 3.5–5.0)
Alkaline Phosphatase: 119 U/L (ref 38–126)
Anion gap: 9 (ref 5–15)
BILIRUBIN TOTAL: 0.6 mg/dL (ref 0.3–1.2)
BUN: 19 mg/dL (ref 6–20)
CHLORIDE: 101 mmol/L (ref 101–111)
CO2: 25 mmol/L (ref 22–32)
CREATININE: 0.72 mg/dL (ref 0.44–1.00)
Calcium: 8.6 mg/dL — ABNORMAL LOW (ref 8.9–10.3)
GFR calc Af Amer: >60 mL/min (ref >60)
GFR calc non Af Amer: >60 mL/min (ref >60)
Glucose, Bld: 155 mg/dL — ABNORMAL HIGH (ref 65–99)
Potassium: 4.2 mmol/L (ref 3.5–5.1)
Sodium: 135 mmol/L (ref 135–145)
Total Protein: 5.8 g/dL — ABNORMAL LOW (ref 6.5–8.1)

## 2015-03-18 LAB — CBC WITH DIFFERENTIAL/PLATELET
Basophils Absolute: 0.1 10*3/uL (ref 0.0–0.1)
Basophils Relative: 1 % (ref 0–1)
Eosinophils Absolute: 0.1 10*3/uL (ref 0.0–0.7)
Eosinophils Relative: 1 % (ref 0–5)
HEMATOCRIT: 41.6 % (ref 36.0–46.0)
HEMOGLOBIN: 13.8 g/dL (ref 12.0–15.0)
LYMPHS PCT: 10 % — AB (ref 12–46)
Lymphs Abs: 0.9 10*3/uL (ref 0.7–4.0)
MCH: 30.8 pg (ref 26.0–34.0)
MCHC: 33.2 g/dL (ref 30.0–36.0)
MCV: 92.9 fL (ref 78.0–100.0)
Monocytes Absolute: 1.1 10*3/uL — ABNORMAL HIGH (ref 0.1–1.0)
Monocytes Relative: 12 % (ref 3–12)
Neutro Abs: 7 10*3/uL (ref 1.7–7.7)
Neutrophils Relative %: 76 % (ref 43–77)
PLATELETS: 349 10*3/uL (ref 150–400)
RBC: 4.48 MIL/uL (ref 3.87–5.11)
RDW: 12.9 % (ref 11.5–15.5)
WBC: 9.2 10*3/uL (ref 4.0–10.5)

## 2015-03-18 NOTE — Progress Notes (Addendum)
Subjective/Complaints: Patient is aphasic and cannot give any complaints. She is smiling in bed speech therapy is starting to work with her. She is naming simple objects. Unable to perform review of systems secondary to a aphasia  Objective: Vital Signs: Blood pressure 120/72, pulse 85, temperature 97.1 F (36.2 C), temperature source Oral, resp. rate 28, weight 42.547 kg (93 lb 12.8 oz), last menstrual period 09/03/1996, SpO2 100 %. No results found. Results for orders placed or performed during the hospital encounter of 03/13/15 (from the past 72 hour(s))  CBC     Status: None   Collection Time: 03/16/15  6:54 AM  Result Value Ref Range   WBC 8.3 4.0 - 10.5 K/uL   RBC 4.04 3.87 - 5.11 MIL/uL   Hemoglobin 12.5 12.0 - 15.0 g/dL   HCT 37.4 36.0 - 46.0 %   MCV 92.6 78.0 - 100.0 fL   MCH 30.9 26.0 - 34.0 pg   MCHC 33.4 30.0 - 36.0 g/dL   RDW 13.0 11.5 - 15.5 %   Platelets 255 150 - 400 K/uL  Basic metabolic panel     Status: Abnormal   Collection Time: 03/16/15  6:54 AM  Result Value Ref Range   Sodium 135 135 - 145 mmol/L   Potassium 3.9 3.5 - 5.1 mmol/L   Chloride 103 101 - 111 mmol/L   CO2 28 22 - 32 mmol/L   Glucose, Bld 115 (H) 65 - 99 mg/dL   BUN 20 6 - 20 mg/dL   Creatinine, Ser 0.89 0.44 - 1.00 mg/dL   Calcium 8.2 (L) 8.9 - 10.3 mg/dL   GFR calc non Af Amer 58 (L) >60 mL/min   GFR calc Af Amer >60 >60 mL/min    Comment: (NOTE) The eGFR has been calculated using the CKD EPI equation. This calculation has not been validated in all clinical situations. eGFR's persistently <60 mL/min signify possible Chronic Kidney Disease.    Anion gap 4 (L) 5 - 15     HEENT: normal Cardio: irregular Resp: CTA B/L and unlabored GI: BS positive and NT, ND Extremity:  No Edema Skin:   Intact Neuro: Abnormal Sensory cannot assess secondary to aphasia, Abnormal Motor 4-/5 on R side , 5-/5 on left, Aphasic and Apraxic Musc/Skel:  Normal Gen NAD   Assessment/Plan: 1. Functional  deficits secondary to Left PCA infarct, subacute L MCA infarct aphasic and gait disorder which require 3+ hours per day of interdisciplinary therapy in a comprehensive inpatient rehab setting. Physiatrist is providing close team supervision and 24 hour management of active medical problems listed below. Physiatrist and rehab team continue to assess barriers to discharge/monitor patient progress toward functional and medical goals. FIM:       FIM - Toileting Toileting steps completed by patient: Adjust clothing prior to toileting, Performs perineal hygiene, Adjust clothing after toileting Toileting Assistive Devices: Grab bar or rail for support Toileting: 4: Steadying assist           Comprehension Comprehension Mode: Auditory Comprehension: 2-Understands basic 25 - 49% of the time/requires cueing 51 - 75% of the time  Expression Expression Mode: Verbal (few words only, expressive aphasia) Expression: 1-Expresses basis less than 25% of the time/requires cueing greater than 75% of the time.  Social Interaction Social Interaction: 4-Interacts appropriately 75 - 89% of the time - Needs redirection for appropriate language or to initiate interaction.  Problem Solving Problem Solving: 1-Solves basic less than 25% of the time - needs direction nearly all the time  or does not effectively solve problems and may need a restraint for safety  Memory Memory: 1-Recognizes or recalls less than 25% of the time/requires cueing greater than 75% of the time  Medical Problem List and Plan: 1. Functional deficits secondary to Subacute left PCA distribution infarct superimposed on previous left MCA infarct causing aphasia as well as balance deficits 2.  A fib/DVT Prophylaxis/Anticoagulation: Pharmaceutical: Other (comment)--Eliquis 3. Pain Management: Continue scheduled robaxin for chronic neck pain. Will also schedule tylenol and add Sportscreme to help with pain control. 4. Mood: LCSW to follow  and assist patient and family with issues.   5. Neuropsych: This patient is not capable of making decisions on her own behalf. 6. Skin/Wound Care: Routine pressure relief measures. Elevated feet when in bed.   7. Fluids/Electrolytes/Nutrition: Monitor I/O. Offer supplements between meals.   8. A fib: Monitor heart rate bid. Continue lopressor bid.   9. UTI: Urine culture negative--. Will d/c antibiotics and monitor. Push po fluids.   10. Conjunctivitis: Warm moist compresses with Tobradex eye drops qid.   11. RLS: continue Requip tid.    LOS (Days) 1 A FACE TO FACE EVALUATION WAS PERFORMED  Linda [name] E 03/18/2015, 8:57 AM

## 2015-03-18 NOTE — Interval H&P Note (Signed)
Linda [name] was admitted today to Inpatient Rehabilitation with the diagnosis of Left PCA and subacute L MCA infarct.  The patient's history has been reviewed, patient examined, and there is no change in status.  Patient continues to be appropriate for intensive inpatient rehabilitation.  I have reviewed the patient's chart and labs.  Questions were answered to the patient's satisfaction. The PAPE has been reviewed and assessment remains appropriate.  Linda [name] E 03/18/2015, 7:04 AM

## 2015-03-18 NOTE — H&P (View-Only) (Signed)
Physical Medicine and Rehabilitation Admission H&P   Chief Complaint  Patient presents with  . Encephalopathy.    HPI: Linda [name] is a 81 y.o. 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 She was readmitted on 03/14/15 with malaise and refusal to get out of bed as well as confusion. She was started on IV rocephin for recurrent UTI and has had improvement in mentation. X rays of lumbar and thoracic spine with diffuse degenerative changes. robaxin was scheduled to help with neck/back spasms. ST evaluation without evidence of dysphagia. PT evaluation done yesterday and CIR recommended for follow up therapy  Patient with limited verbal output, smiles and is pleasant, cooperative with examination  Review of Systems  Unable to perform ROS: language , Severe aphasia Musculoskeletal: Positive for myalgias and joint pain.  Psychiatric/Behavioral: The patient is nervous/anxious.      Past Medical History  Diagnosis Date  . History of cardiac catheterization 2002    Negative  . Vertebral fracture   . Restless legs syndrome with nocturnal myoclonus 05/13/2013    requip controlled   . Abnormal uterine bleeding [id number]  . Hypothyroidism   . Arthritis   . Osteoporosis   . History of stomach ulcers   . History of jaundice as a child   . Anemia   . Head pain, chronic     "I have pain in the back of my head for months"  . Dizziness     TAKES MECLIZINE TO TREAT  . Hemiparesis and speech and language deficit as late effects of stroke 02/09/2015    Past Surgical History  Procedure Laterality Date  . Back surgery      x 3  . Breast surgery Bilateral     1 lump each breast removed and benign  . Nasal sinus  surgery    . Dilation and curettage of uterus  1970  . Catheterization  2003    cardiac cath  . Kyphoplasty  2010  . Cataract extraction Bilateral 2011  . Cholecystectomy  1995    Lap  . Cardiac catheterization  2002    "it was normal" per pt - no record found  . Robotic assisted salpingo oopherectomy Bilateral 11/02/2014    Procedure: ROBOTIC ASSISTED bilateral SALPINGO OOPHORECTOMY; Surgeon: Everitt Amber, MD; Location: [facility]; Service: Gynecology; Laterality: Bilateral;  . Laparoscopy N/A 11/02/2014    Procedure: LAPAROSCOPY DIAGNOSTIC; Surgeon: Everitt Amber, MD; Location: [facility]; Service: Gynecology; Laterality: N/A;  . Abdominal hysterectomy      Family History  Problem Relation Age of Onset  . Asthma Brother   . Osteoarthritis Son   . Hypertension Father   . Stroke Father   . Cancer Brother     stomach     Social History: Was independent prior to 12/2014. Lives with son who provides supervision. Able to ambulate with walker PTA? Per reports that she has never smoked. She has never used smokeless tobacco. Per reports that she does not drink alcohol or use illicit drugs.   Allergies  Allergen Reactions  . Ibuprofen Anaphylaxis    Passed out and had to be hospitalized  . Penicillins     Skin on head was crawling and very very sleepy  . Demerol [Meperidine Hcl] Nausea And Vomiting  . Dilaudid [Hydromorphone Hcl] Nausea And Vomiting  . Fentanyl Other (  See Comments)    unknown  . Lidoderm [Lidocaine] Other (See Comments)    unknown  . Lyrica [Pregabalin]     "made me a zombie'  . Morphine And Related Nausea And Vomiting  . Other     hamburger  . Percocet [Oxycodone-Acetaminophen] Nausea And Vomiting  . Tramadol Other (See Comments)    unknown  . Vicodin [Hydrocodone-Acetaminophen] Nausea And Vomiting    Medications  Prior to Admission  Medication Sig Dispense Refill  . acetaminophen (TYLENOL) 500 MG tablet Take 1,000 mg by mouth every 6 (six) hours as needed for mild pain. Usually only takes 2 x a day    . apixaban (ELIQUIS) 5 MG TABS tablet Take 1 tablet (5 mg total) by mouth 2 (two) times daily. Please discontinue Aspirin once started on Eliquis! 60 tablet 3  . calcium gluconate 500 MG tablet Take 500 mg by mouth 2 (two) times daily. With 1000 units D3    . cephALEXin (KEFLEX) 500 MG capsule Take 1 capsule (500 mg total) by mouth 3 (three) times daily. 21 capsule 0  . cholecalciferol (VITAMIN D) 1000 UNITS tablet Take 1,000 Units by mouth 2 (two) times daily. Take with calium    . Cranberry 200 MG CAPS Take 1 capsule by mouth daily with lunch.     . denosumab (PROLIA) 60 MG/ML SOLN injection Inject 60 mg into the skin every 6 (six) months. Administer in upper arm, thigh, or abdomen    . docusate sodium (COLACE) 100 MG capsule Take 100 mg by mouth 2 (two) times daily as needed for mild constipation or moderate constipation.    . Flaxseed, Linseed, (FLAX SEED OIL) 1000 MG CAPS Take 1 capsule by mouth 2 (two) times daily.     . hyoscyamine (ANASPAZ) 0.125 MG TBDP disintergrating tablet Place 0.125 mg under the tongue 2 (two) times daily as needed for bladder spasms.    Marland Kitchen levothyroxine (SYNTHROID, LEVOTHROID) 50 MCG tablet Take 50 mcg by mouth every morning.     . meclizine (ANTIVERT) 25 MG tablet Take 25 mg by mouth daily as needed for dizziness.     . metoprolol tartrate (LOPRESSOR) 25 MG tablet Take 25 mg by mouth 2 (two) times daily.    . Multiple Vitamins-Minerals (CENTRUM SILVER PO) Take 1 tablet by mouth daily with lunch.     . ondansetron (ZOFRAN) 4 MG tablet Take 1 tablet (4 mg total) by mouth every 8 (eight) hours as needed for nausea or vomiting. 20 tablet 0  . pantoprazole sodium (PROTONIX) 40 mg/20 mL PACK Take 20 mLs  (40 mg total) by mouth daily. (Patient taking differently: Take 40 mg by mouth daily as needed (acid reflux, heartburn). ) 30 each 4  . Propylene Glycol (SYSTANE BALANCE OP) Apply 1 drop to eye 2 (two) times daily.    Marland Kitchen rOPINIRole (REQUIP) 0.25 MG tablet TAKE 1 TABLET BY MOUTH 3 TIMES A DAY 270 tablet 2  . sennosides-docusate sodium (SENOKOT-S) 8.6-50 MG tablet Take 2 tablets by mouth daily.    . sertraline (ZOLOFT) 50 MG tablet Take 50 mg by mouth daily.    . traMADol (ULTRAM) 50 MG tablet Take 50 mg by mouth every 6 (six) hours as needed for severe pain.      Home: Home Living Family/patient expects to be discharged to:: Private residence Living Arrangements: Children Available Help at Discharge: Family, Friend(s), Available PRN/intermittently Type of Home: House Home Access: Stairs to enter [employer] of Steps: 4 Entrance Stairs-Rails: Left  Home Layout: Two level, Laundry or work area in basement, Able to live on main level with bedroom/bathroom Home Equipment: [name] - single point, Environmental consultant - 2 wheels, Shower seat, Tub bench, Systems analyst, [employer] - quad  Functional History: Prior Function Level of Independence: Needs assistance Gait / Transfers Assistance Needed: Using cane/RW - assist for ambulation ADL's / Homemaking Assistance Needed: Assist for ADLs Comments: Previously ambulated with cane; still driving short distances  Functional Status:  Mobility: Bed Mobility Overal bed mobility: Needs Assistance Bed Mobility: Supine to Sit Supine to sit: Supervision Sit to supine: Mod assist General bed mobility comments: pt relatively weak to lift LE's onto bed Transfers Overall transfer level: Needs assistance Equipment used: Rolling walker (2 wheeled), 1 person hand held assist Transfers: Sit to/from Stand, Stand Pivot Transfers Sit to Stand: Mod assist Stand pivot transfers: Mod assist General transfer comment: cues for hand placement,  but pt still pulled up on walker.  Ambulation/Gait Ambulation/Gait assistance: Min assist, Mod assist Ambulation Distance (Feet): 5 Feet Assistive device: 1 person hand held assist, Rolling walker (2 wheeled) Gait Pattern/deviations: Step-to pattern, Shuffle, Wide base of support, Trunk flexed General Gait Details: Pt sidestepped up bed due to frequent sitting once up Gait velocity: Decreased Gait velocity interpretation: Below normal speed for age/gender    ADL: ADL Overall ADL's : Needs assistance/impaired Eating/Feeding: Minimal assistance, Sitting Eating/Feeding Details (indicate cue type and reason): OT assisted in getting eggs on fork Lower Body Dressing: Moderate assistance, Sit to/from stand, Sitting/lateral leans Toilet Transfer: Moderate assistance, Stand-pivot, RW (from bed to chair) Functional mobility during ADLs: Moderate assistance, Rolling walker (stand pivot) General ADL Comments: Pt able to don/doff socks sitting EOB.   Cognition: Cognition Overall Cognitive Status: History of cognitive impairments - at baseline Orientation Level: Oriented to person, Oriented to place, Other (comment) (Pt able to state name and answer yes and no questions ) Cognition Arousal/Alertness: Awake/alert Behavior During Therapy: WFL for tasks assessed/performed, Anxious Overall Cognitive Status: History of cognitive impairments - at baseline General Comments: difficult to assess due to impaired communication    Blood pressure 113/61, pulse 93, temperature 98.4 F (36.9 C), temperature source Oral, resp. rate 16, height '5\' 3"'  (1.6 m), weight 46.675 kg (102 lb 14.4 oz), last menstrual period 09/03/1996, SpO2 100 %. Physical Exam  Nursing note and vitals reviewed. Constitutional: She appears well-developed.  Thin frail appearing elderly female  HENT:  Head: Normocephalic and atraumatic.  Eyes: Pupils are equal, round, and reactive to light. Right conjunctiva is injected. Left  conjunctiva is injected.  Crusty drainage on eyelids.  Neck: Normal range of motion. Neck supple. No tracheal deviation present. No thyromegaly present.  Cardiovascular: Normal rate. An irregular rhythm present.  Respiratory: Effort normal and breath sounds normal. No respiratory distress. She has no wheezes.  GI: Soft. Bowel sounds are normal. She exhibits no distension. There is no tenderness.  Musculoskeletal: She exhibits no edema.  Left flank and paraspinal hypersensitive to touch.  Neurological: She is alert. A cranial nerve deficit is present.  Left facial weakness. Able to follow simple motor commands. Verbal output limited to Yes or No. Motor strength is 4/5 bilateral deltoid, biceps, triceps, grip, hip flexor, knee extensor, ankle dorsiflexor and plantar flexor.  Sensation unable to assess secondary to aphasia with probable speech apraxia   Skin: Skin is warm and dry. No rash noted. No erythema.       Lab Results Last 48 Hours    Results for orders placed or performed  during the hospital encounter of 03/13/15 (from the past 48 hour(s))  CBC Status: None   Collection Time: 03/16/15 6:54 AM  Result Value Ref Range   WBC 8.3 4.0 - 10.5 K/uL   RBC 4.04 3.87 - 5.11 MIL/uL   Hemoglobin 12.5 12.0 - 15.0 g/dL   HCT 37.4 36.0 - 46.0 %   MCV 92.6 78.0 - 100.0 fL   MCH 30.9 26.0 - 34.0 pg   MCHC 33.4 30.0 - 36.0 g/dL   RDW 13.0 11.5 - 15.5 %   Platelets 255 150 - 400 K/uL  Basic metabolic panel Status: Abnormal   Collection Time: 03/16/15 6:54 AM  Result Value Ref Range   Sodium 135 135 - 145 mmol/L   Potassium 3.9 3.5 - 5.1 mmol/L   Chloride 103 101 - 111 mmol/L   CO2 28 22 - 32 mmol/L   Glucose, Bld 115 (H) 65 - 99 mg/dL   BUN 20 6 - 20 mg/dL   Creatinine, Ser 0.89 0.44 - 1.00 mg/dL   Calcium 8.2 (L) 8.9 - 10.3 mg/dL   GFR calc non Af Amer 58 (L) >60 mL/min   GFR calc Af  Amer >60 >60 mL/min    Comment: (NOTE) The eGFR has been calculated using the CKD EPI equation. This calculation has not been validated in all clinical situations. eGFR's persistently <60 mL/min signify possible Chronic Kidney Disease.    Anion gap 4 (L) 5 - 15      Imaging Results (Last 48 hours)    No results found.       Medical Problem List and Plan: 1. Functional deficits secondary to Subacute left PCA distribution infarct superimposed on previous left MCA infarct causing aphasia as well as balance deficits 2. A fib/DVT Prophylaxis/Anticoagulation: Pharmaceutical: Other (comment)--Eliquis 3. Pain Management: Continue scheduled robaxin for chronic neck pain. Will also schedule tylenol and add Sportscreme to help with pain control. 4. Mood: LCSW to follow and assist patient and family with issues.  5. Neuropsych: This patient is not capable of making decisions on her own behalf. 6. Skin/Wound Care: Routine pressure relief measures. Elevated feet when in bed.  7. Fluids/Electrolytes/Nutrition: Monitor I/O. Offer supplements between meals.  8. A fib: Monitor heart rate bid. Continue lopressor bid.  9. UTI: Urine culture negative--completed Day #4 ceftriaxone. Will d/c antibiotics and monitor. Push po fluids.  10. Conjunctivitis: Warm moist compresses with Tobradex eye drops qid.  11. RLS: continue Requip tid.      Post Admission Physician Evaluation: 1. Functional deficits secondary to Subacute left PCA distribution infarct superimposed on previous left MCA infarct causing aphasia as well as balance deficits 2. Patient is admitted to receive collaborative, interdisciplinary care between the physiatrist, rehab nursing staff, and therapy team. 3. Patient's level of medical complexity and substantial therapy needs in context of that medical necessity cannot be provided at a lesser intensity of care such as a SNF. 4. Patient has experienced substantial functional  loss from his/her baseline which was documented above under the "Functional History" and "Functional Status" headings. Judging by the patient's diagnosis, physical exam, and functional history, the patient has potential for functional progress which will result in measurable gains while on inpatient rehab. These gains will be of substantial and practical use upon discharge in facilitating mobility and self-care at the household level. 5. Physiatrist will provide 24 hour management of medical needs as well as oversight of the therapy plan/treatment and provide guidance as appropriate regarding the interaction of the two.  6. 24 hour rehab nursing will assist with bladder management, bowel management, safety, skin/wound care, disease management, medication administration and patient education and help integrate therapy concepts, techniques,education, etc. 7. PT will assess and treat for/with: pre gait, gait training, endurance , safety, equipment, neuromuscular re education. Goals are: sup . 8. OT will assess and treat for/with: ADLs, Cognitive perceptual skills, Neuromuscular re education, safety, endurance, equipment. Goals are: Mod I. Therapy may proceed with showering this patient. 9. SLP will assess and treat for/with: A aphasia, apraxia of speech. Goals are: Express basic needs using verbal and nonverbal techniques. 10. Case Management and Social Worker will assess and treat for psychological issues and discharge planning. 11. Team conference will be held weekly to assess progress toward goals and to determine barriers to discharge. 12. Patient will receive at least 3 hours of therapy per day at least 5 days per week. 13. ELOS: 9-12 days  14. Prognosis: good     [name] M.D. [facility] FAAPM&R (Sports Med, Neuromuscular Med) Diplomate Am Board of Electrodiagnostic Med  03/17/2015

## 2015-03-18 NOTE — Evaluation (Signed)
Speech Language Pathology Assessment and Plan  Patient Details  Name: Linda [name] MRN: [id number] Date of Birth: 1928/10/05  SLP Diagnosis: Aphasia;Cognitive Impairments  Rehab Potential: Fair ELOS: 7-10 days     Today's Date: 03/18/2015 SLP Individual Time: 0803-0900 SLP Individual Time Calculation (min): 57 min   Problem List:  Patient Active Problem List   Diagnosis Date Noted  . Apraxia complicating stroke 40/98/1191  . 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 Embolic stroke involving left posterior cerebral artery 03/17/2015  . Sequela, post-stroke 03/17/2015  . Encephalopathy 03/14/2015  . UTI (lower urinary tract infection) 03/14/2015  . Altered mental state 03/14/2015  . Chest pain 03/14/2015  . Back pain   . Constipation   . CVA (cerebral infarction) 02/18/2015  . Hemiparesis and speech and language deficit as late effects of stroke 02/09/2015  . History of upper gastrointestinal bleeding   . Long-term (current) use of anticoagulants   . Protein-calorie malnutrition, severe 12/27/2014  . Paroxysmal atrial fibrillation   . Left middle cerebral artery stroke 12/25/2014  . Essential hypertension 12/25/2014  . Dysphagia, pharyngoesophageal phase 12/25/2014  . Hypothyroidism 12/25/2014  . Restless legs syndrome with nocturnal myoclonus 05/13/2013   Past Medical History:  Past Medical History  Diagnosis Date  . History of cardiac catheterization 2002    Negative  . Vertebral fracture   . Restless legs syndrome with nocturnal myoclonus 05/13/2013     requip controlled   . Abnormal uterine bleeding [id number]  . Hypothyroidism   . Arthritis   . Osteoporosis   . History of stomach ulcers   . History of jaundice as a child   . Anemia   . Head pain, chronic     "I have pain in the back of my head for months"  . Dizziness     TAKES MECLIZINE TO TREAT  . Hemiparesis and speech and language deficit as late effects  of stroke 02/09/2015   Past Surgical History:  Past Surgical History  Procedure Laterality Date  . Back surgery      x 3  . Breast surgery Bilateral     1 lump each breast removed and benign  . Nasal sinus surgery    . Dilation and curettage of uterus  1970  . Catheterization  2003    cardiac cath  . Kyphoplasty  2010  . Cataract extraction Bilateral 2011  . Cholecystectomy  1995    Lap  . Cardiac catheterization  2002    "it was normal" per pt - no record found  . Robotic assisted salpingo oopherectomy Bilateral 11/02/2014    Procedure: ROBOTIC ASSISTED bilateral SALPINGO OOPHORECTOMY;  Surgeon: Everitt Amber, MD;  Location: [facility];  Service: Gynecology;  Laterality: Bilateral;  . Laparoscopy N/A 11/02/2014    Procedure: LAPAROSCOPY DIAGNOSTIC;  Surgeon: Everitt Amber, MD;  Location: [facility];  Service: Gynecology;  Laterality: N/A;  . Abdominal hysterectomy      Assessment / Plan / Recommendation Clinical Impression   Linda [name] is a 81 y.o. female with history of L-MCA infarct 12/2014 with mild residual HP and expressive aphasia and recurrent occipital stroke 02/18/2015. She was readmitted on 03/14/15 with malaise and refusal to get out of bed as well as confusion. On dys 3 diet with thin liquids.  Pt admitted to CIR on 03/17/2015.  SLP evaluation completed on 03/18/2015 with the following results: Pt presents with a moderate residual expressive>receptive aphasia, likley occuring from previous L MCA infarct.  Pt's comprehension is relatively intact for answering basic yes/no questions and following 1 and 2 step commands; however, she exhibits breakdown with more complex questions or multi-step directives.   Pt's verbal expression is characterized by 1-2 word utterances that contain phonemic paraphasic errors, perseveration, and jargon.  She is intermittently able to name basic, familiar objects from her room with overall max assist multimodal cues but her naming accuracy is not consistent.  She  demonstrates little awareness of her verbal errors and does not make attempts to correct them.  Pt's cognition was difficult to assess given expressive language deficits; however, informally pt presents with decreased initiation of tasks and decreased functional problem solving.  Suspect pt to have memory deficits as well, although this is difficult to differentiate given her aphasia.   A bedside swallowing evaluation was administered given that pt's diet has been downgraded from previous SLP's recommendation of regular textures and thin liquids.  Pt consumed dys 3 textures and thin liquids with no overt s/s of aspiration and complete clearance of solids from the oral cavity post swallow.  Pt did require extra time for mastication of solid foods due to cognitive deficits and generalized fatigue; therefore, she may benefit from remaining on dys 3 textures for energy conservation although no further ST needs are indicated for swallowing and pt may be advanced as tolerated back to regular textures.   Pt would benefit from skilled ST while inpatient in order to improve cognitive-linguistic function in order to maximize functional independence and reduce burden of care prior to discharge.  Anticipate that pt will need 24/7 supervision, assistance for medication and financial management, and ST follow up at next level of care to address cognition and communication.     Skilled Therapeutic Interventions          Cognitive-linguistic and bedside swallowing evaluation completed with results and recommendations reviewed with  patient.     SLP Assessment  Patient will need skilled Speech Lanaguage Pathology Services during CIR admission    Recommendations  SLP Diet Recommendations: Dysphagia 3 (Mech soft);Thin Liquid Administration via: Cup;Straw Medication Administration: Whole meds with liquid Supervision: Patient able to self feed;Intermittent supervision to cue for compensatory strategies Compensations:  Small sips/bites;Slow rate Postural Changes and/or Swallow Maneuvers: Out of bed for meals;Seated upright 90 degrees Oral Care Recommendations: Oral care BID Patient destination: Home Follow up Recommendations: Home Health SLP;24 hour supervision/assistance Equipment Recommended: None recommended by SLP    SLP Frequency 3 to 5 out of 7 days   SLP Treatment/Interventions Cognitive remediation/compensation;Cueing hierarchy;Environmental controls;Internal/external aids;Speech/Language facilitation;Multimodal communication approach;Functional tasks;Patient/family education    Pain Pain Assessment Pain Assessment: Faces Faces Pain Scale: No hurt Prior Functioning Cognitive/Linguistic Baseline: Baseline deficits Baseline deficit details: previous history of 2 recent CVAs including L CVA with residual expressive aphasia,  also has a history of mild cognitive disturbance per report   Type of Home: House  Lives With: Son Available Help at Discharge: Family;Friend(s);Available PRN/intermittently  Short Term Goals: Week 1: SLP Short Term Goal 1 (Week 1): Pt will name basic, familiar objects with 75% accuracy during functional tasks with mod assist verbal and visual cues. SLP Short Term Goal 2 (Week 1): Pt will recognize and correct verbal errors during basic, structured tasks with mod-max assist verbal and visual cues over 75% of observable opportunities   SLP Short Term Goal 3 (Week 1): Pt will complete basic, familiar self care tasks with mod assist multimodal cues for functional problem solving over 75% of observable opportunities.  SLP Short Term Goal 4 (Week 1): Pt will initiate verbalizations and/or tasks with mod assist multimodal cues over 75% of observable opportunities.   See FIM for current functional status Refer to Care Plan for Long Term Goals  Recommendations for other services: None  Discharge Criteria: Patient will be discharged from SLP if patient refuses treatment 3  consecutive times without medical reason, if treatment goals not met, if there is a change in medical status, if patient makes no progress towards goals or if patient is discharged from hospital.  The above assessment, treatment plan, treatment alternatives and goals were discussed and mutually agreed upon: No family available/patient unable  Emilio Math 03/18/2015, 2:20 PM

## 2015-03-18 NOTE — Evaluation (Signed)
Physical Therapy Assessment and Plan  Patient Details  Name: Linda [name] MRN: [id number] Date of Birth: 10/05/1928  PT Diagnosis: Abnormal posture, Abnormality of gait, Hemiparesis dominant, Impaired cognition, Low back pain, Muscle weakness and Pain in cervical spine Rehab Potential: Fair ELOS: 10-12 days   Today's Date: 03/18/2015 PT Individual Time: 9:05-10:15 and 14:38-15:03 (66mn and 28m)   Problem List:  Patient Active Problem List   Diagnosis Date Noted  . Apraxia complicating stroke [id number]. 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 Embolic stroke involving left posterior cerebral artery 03/17/2015  . Sequela, post-stroke 03/17/2015  . Encephalopathy 03/14/2015  . UTI (lower urinary tract infection) 03/14/2015  . Altered mental state 03/14/2015  . Chest pain 03/14/2015  . Back pain   . Constipation   . CVA (cerebral infarction) 02/18/2015  . Hemiparesis and speech and language deficit as late effects of stroke 02/09/2015  . History of upper gastrointestinal bleeding   . Long-term (current) use of anticoagulants   . Protein-calorie malnutrition, severe 12/27/2014  . Paroxysmal atrial fibrillation   . Left middle cerebral artery stroke 12/25/2014  . Essential hypertension 12/25/2014  . Dysphagia, pharyngoesophageal phase 12/25/2014  . Hypothyroidism 12/25/2014  . Restless legs syndrome with nocturnal myoclonus 05/13/2013    Past Medical History:  Past Medical History  Diagnosis Date  . History of cardiac catheterization 2002    Negative  . Vertebral fracture   . Restless legs syndrome with nocturnal myoclonus 05/13/2013     [place] controlled   . Abnormal uterine bleeding [id number]. Hypothyroidism   . Arthritis   . Osteoporosis   . History of stomach ulcers   . History of jaundice as a child   . Anemia   . Head pain, chronic     "I have pain in the back of my head for months"  . Dizziness     TAKES  MECLIZINE TO TREAT  . Hemiparesis and speech and language deficit as late effects of stroke 02/09/2015   Past Surgical History:  Past Surgical History  Procedure Laterality Date  . Back surgery      x 3  . Breast surgery Bilateral     1 lump each breast removed and benign  . Nasal sinus surgery    . Dilation and curettage of uterus  1970  . Catheterization  2003    cardiac cath  . Kyphoplasty  2010  . Cataract extraction Bilateral 2011  . Cholecystectomy  1995    Lap  . Cardiac catheterization  2002    "it was normal" per pt - no record found  . Robotic assisted salpingo oopherectomy Bilateral 11/02/2014    Procedure: ROBOTIC ASSISTED bilateral SALPINGO OOPHORECTOMY;  Surgeon: EmEveritt AmberMD;  Location: [facility];  Service: Gynecology;  Laterality: Bilateral;  . Laparoscopy N/A 11/02/2014    Procedure: LAPAROSCOPY DIAGNOSTIC;  Surgeon: EmEveritt AmberMD;  Location: [facility];  Service: Gynecology;  Laterality: N/A;  . Abdominal hysterectomy      Assessment & Plan Clinical Impression:Linda [name] a [age].o. 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 She was readmitted on 03/14/15 with malaise and refusal to get out of bed as well as confusion. She was started on IV rocephin for recurrent UTI and has had improvement in  mentation. X rays of lumbar and thoracic spine with diffuse degenerative changes. robaxin was scheduled to help with neck/back spasms. ST evaluation without evidence of dysphagia. PT evaluation done yesterday and CIR recommended for follow up therapy Patient transferred to CIR on 03/17/2015 .   Patient currently requires mod with mobility secondary to muscle weakness and muscle joint tightness, decreased cardiorespiratoy endurance, impaired timing and sequencing, decreased coordination and  decreased motor planning, decreased attention, decreased awareness, decreased problem solving, decreased safety awareness and decreased memory and decreased sitting balance and decreased standing balance.  Prior to hospitalization, patient was supervision with mobility and lived with Son in a House home.  Home access is 4Stairs to enter.  Patient will benefit from skilled PT intervention to maximize safe functional mobility, minimize fall risk and decrease caregiver burden for planned discharge home with 24 hour supervision.  Anticipate patient will benefit from follow up [place] at discharge.  PT - End of Session Activity Tolerance: Tolerates < 10 min activity with changes in vital signs Endurance Deficit: Yes Endurance Deficit Description: required multiple rest breaks, orthostatic changes following stair training PT Assessment Rehab Potential (ACUTE/IP ONLY): Fair Barriers to Discharge: Inaccessible home environment;Decreased caregiver support Barriers to Discharge Comments: son works PT Patient demonstrates impairments in the following area(s): Balance;Endurance;Motor;Pain;Perception;Safety PT Transfers Functional Problem(s): Bed Mobility;Bed to Chair;Car;Furniture PT Locomotion Functional Problem(s): Ambulation;Wheelchair Mobility;Stairs PT Plan PT Intensity: Minimum of 1-2 x/day ,45 to 90 minutes PT Frequency: 5 out of 7 days PT Duration Estimated Length of Stay: 10-12 days PT Treatment/Interventions: Ambulation/gait training;Balance/vestibular training;Cognitive remediation/compensation;Discharge planning;Disease management/prevention;DME/adaptive equipment instruction;Functional mobility training;Neuromuscular re-education;Pain management;Patient/family education;Psychosocial support;Stair training;Therapeutic Activities;Therapeutic Exercise;UE/LE Strength taining/ROM;UE/LE Coordination activities;Visual/perceptual remediation/compensation;Wheelchair propulsion/positioning PT Transfers  Anticipated Outcome(s): Supervision PT Locomotion Anticipated Outcome(s): Supervision PT Recommendation Follow Up Recommendations: Home health PT;24 hour supervision/assistance Patient destination: Home Equipment Recommended: Rolling walker with 5" wheels;Wheelchair (measurements);Wheelchair cushion (measurements);To be determined Equipment Details: Needs information from family  Skilled Therapeutic Intervention PT tx initiated upon eval. Pt oriented to unit, call bell, PT POC, and rehab. Pt was educated on fall risk reduction and precautions. Pt instructed in transfer training for basic squat pivot transfer, sit<>stands, sitting balance, standing balance training, stair training, and gait with RW. Pt required up to Mod A and is somewhat limited by aphasia. Pt had difficulty with coordination during WC propulsion with limited R-sided attention. Pt performed seated rx bottle task, but was unable to motor plan this activity despite visual demonstration and cues.  Pt left in bed with alarm on. Pt encouraged to spend as much time OOB as possible.   Second tx focused on gait with RW and NMR via forced use, manual facilitation, and verbal cues. Pt performed bed mobility with S this afternoon. Requested K-pad for pain relief from RN. Pt performed gait in room with RW and in hall up to 76' and Min A >>Mod A as pt fatigued. Pt needs safety cues throughout to avoid obstacles.   NMR performed via standing balance at sink x2 min for functional tasks and during functional tasks to reduce posterior lean upon standing and limited UE support. Performed supine HS and heel cord stretches to improve balance reactions. Pt left in bed with alarm on. All needs in reach.     PT Evaluation Precautions/Restrictions Precautions Precautions: Fall;Other (comment) Precaution Comments: Aphasia Restrictions Weight Bearing Restrictions: No General   Vital SignsTherapy Vitals Pulse Rate: 90 Resp: 20 BP: 123/63  mmHg Patient Position (if appropriate): Lying Oxygen Therapy SpO2: 100 % Pain 4/10 back  pain per faces scale, requested K-pad Home Living/Prior Functioning Home Living Available Help at Discharge: Family;Friend(s);Available PRN/intermittently Type of Home: House Home Access: Stairs to enter [employer] of Steps: 4 Entrance Stairs-Rails: Left Home Layout: Two level;Laundry or work area in basement;Able to live on main level with bedroom/bathroom Bathroom Shower/Tub: Gaffer;Door Bathroom Accessibility: Yes  Lives With: Son Prior Function Level of Independence: Requires assistive device for independence;Needs assistance with ADLs  Able to Take Stairs?: Yes Driving: Yes (short distances) Comments: Previously ambulated with cane; still driving short distances Vision/Perception  Perception Perception: Impaired Inattention/Neglect: Does not attend to right side of body Praxis Praxis: Impaired Praxis Impairment Details: Ideomotor;Motor planning  Cognition Overall Cognitive Status: History of cognitive impairments - at baseline Arousal/Alertness: Awake/alert Orientation Level: Oriented to person;Disoriented to place;Disoriented to time Problem Solving: Impaired Problem Solving Impairment: Functional basic Comments: difficult to assess secondary to aphasia, appears pretty consistent with yes/no Sensation Sensation Light Touch: Appears Intact Proprioception: Appears Intact Coordination Gross Motor Movements are Fluid and Coordinated: No Fine Motor Movements are Fluid and Coordinated: No Coordination and Movement Description: Pt with decreased timing, excursion, and accuracy with toe taps,  Finger Nose Finger Test: decreased accuracy Motor  Motor Motor: Hemiplegia;Other (comment);Motor apraxia Motor - Skilled Clinical Observations: Pt Pt has movement in all extremities, but deomnstrates inconsistent levels of sustained contrations and attention to R-side   Mobility Bed Mobility Bed Mobility: Supine to Sit Supine to Sit: 3: Mod assist Supine to Sit Details (indicate cue type and reason): Pt unable to follow instructions for logrolling, wanting to pull up to sit Sit to Supine: 4: Min assist Transfers Transfers: Yes Squat Pivot Transfers: 2: Max Teacher, English as a foreign language Transfer Details (indicate cue type and reason): Cues for hand placement and technique. Lifting and lowering assist needed due to posterior lean and demonstration of back pain.  Locomotion  Ambulation Ambulation: Yes Ambulation/Gait Assistance: 3: Mod assist Ambulation Distance (Feet): 25 Feet Assistive device: Rolling walker Ambulation/Gait Assistance Details: Gait marked by flexed posture, shuffling steps, decreased DF in terminal stance, and fatigue.  Stairs / Additional Locomotion Stairs: Yes Stairs Assistance: 3: Mod assist Stairs Assistance Details: Tactile cues for weight shifting;Tactile cues for placement;Visual cues for safe use of DME/AE;Visual cues/gestures for precautions/safety;Visual cues/gestures for sequencing;Verbal cues for sequencing;Verbal cues for technique;Verbal cues for precautions/safety Stairs Assistance Details (indicate cue type and reason): Lowering assist needed Stair Management Technique: Two rails;Step to pattern Number of Stairs: 4 Height of Stairs: 6 Wheelchair Mobility Wheelchair Mobility: Yes Wheelchair Assistance: 3: Building surveyor Details: Tactile cues for placement;Tactile cues for sequencing;Visual cues for safe use of DME/AE;Visual cues/gestures for sequencing Wheelchair Propulsion: Both upper extremities Wheelchair Parts Management: Needs assistance Distance: 20  Trunk/Postural Assessment  Cervical Assessment Cervical Assessment: Exceptions to [facility] (Limited by chronic pain and ROM) Thoracic Assessment Thoracic Assessment: Exceptions to [facility] (kyphotic) Lumbar Assessment Lumbar Assessment: Exceptions to [facility] (Chronic  pain and degeneration) Postural Control Postural Control: Deficits on evaluation (Forward flexed and decreased righting reactions)  Balance Balance Balance Assessed: Yes Dynamic Sitting Balance Dynamic Sitting - Balance Support: Feet supported;During functional activity;Left upper extremity supported;Right upper extremity supported Dynamic Sitting - Level of Assistance: 5: Stand by assistance Static Standing Balance Static Standing - Balance Support: Right upper extremity supported;Left upper extremity supported;During functional activity Static Standing - Level of Assistance: 4: Min assist Dynamic Standing Balance Dynamic Standing - Balance Support: Right upper extremity supported;Left upper extremity supported;During functional activity Dynamic Standing - Level of Assistance: 3: Mod  assist Dynamic Standing - Balance Activities: Lateral lean/weight shifting;Forward lean/weight shifting Extremity Assessment  RUE Assessment RUE Assessment: Exceptions to WFL (ROM WFL, strength grossly 4/5) LUE Assessment LUE Assessment: Exceptions to WFL (ROM WFL, strength grossly 4/5) RLE Assessment RLE Assessment: Exceptions to [facility] (Difficult to assess due to aphasia, grossly 3+/5 throuhgout) RLE PROM (degrees) RLE Overall PROM Comments: Decreased ankle DF -5 deg, HS limited as well  LLE Assessment LLE Assessment: Exceptions to WFL LLE PROM (degrees) LLE Overall PROM Comments: Decreased ankle DF -3 deg, HS limited as well  LLE Strength LLE Overall Strength Comments: Grossly 4/5 throughout, limited assessment due to aphasia  FIM:  FIM - Control and instrumentation engineer Devices: Arm rests;Bed rails Bed/Chair Transfer: 4: Supine > Sit: Min A (steadying Pt. > 75%/lift 1 leg);4: Sit > Supine: Min A (steadying pt. > 75%/lift 1 leg);3: Bed > Chair or W/C: Mod A (lift or lower assist);3: Chair or W/C > Bed: Mod A (lift or lower assist) FIM - Locomotion: Wheelchair Distance: 20 Locomotion:  Wheelchair: 1: Travels less than 50 ft with moderate assistance (Pt: 50 - 74%) FIM - Locomotion: Ambulation Locomotion: Ambulation Assistive Devices: Administrator Ambulation/Gait Assistance: 3: Mod assist Locomotion: Ambulation: 1: Travels less than 50 ft with moderate assistance (Pt: 50 - 74%) FIM - Locomotion: Stairs Locomotion: Scientist, physiological: Hand rail - 2 Locomotion: Stairs: 2: Up and Down 4 - 11 stairs with moderate assistance (Pt: 50 - 74%)   Refer to Care Plan for Long Term Goals  Recommendations for other services: None  Discharge Criteria: Patient will be discharged from PT if patient refuses treatment 3 consecutive times without medical reason, if treatment goals not met, if there is a change in medical status, if patient makes no progress towards goals or if patient is discharged from hospital.  The above assessment, treatment plan, treatment alternatives and goals were discussed and mutually agreed upon: by patient  Everhett Bozard, Corinna Lines, PT, DPT  03/18/2015, 1:00 PM

## 2015-03-18 NOTE — Evaluation (Signed)
Occupational Therapy Assessment and Plan  Patient Details  Name: Linda [name] MRN: [id number] Date of Birth: 1929/03/01  OT Diagnosis: abnormal posture, acute pain, cognitive deficits, hemiplegia affecting dominant side and muscle weakness (generalized) Rehab Potential: Rehab Potential (ACUTE ONLY): Good ELOS: 10-12 days   Today's Date: 03/18/2015 OT Individual Time: 1100-1200 OT Individual Time Calculation (min): 60 min     Problem List:  Patient Active Problem List   Diagnosis Date Noted  . Apraxia complicating stroke 02/24/7627  . 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 Embolic stroke involving left posterior cerebral artery 03/17/2015  . Sequela, post-stroke 03/17/2015  . Encephalopathy 03/14/2015  . UTI (lower urinary tract infection) 03/14/2015  . Altered mental state 03/14/2015  . Chest pain 03/14/2015  . Back pain   . Constipation   . CVA (cerebral infarction) 02/18/2015  . Hemiparesis and speech and language deficit as late effects of stroke 02/09/2015  . History of upper gastrointestinal bleeding   . Long-term (current) use of anticoagulants   . Protein-calorie malnutrition, severe 12/27/2014  . Paroxysmal atrial fibrillation   . Left middle cerebral artery stroke 12/25/2014  . Essential hypertension 12/25/2014  . Dysphagia, pharyngoesophageal phase 12/25/2014  . Hypothyroidism 12/25/2014  . Restless legs syndrome with nocturnal myoclonus 05/13/2013    Past Medical History:  Past Medical History  Diagnosis Date  . History of cardiac catheterization 2002    Negative  . Vertebral fracture   . Restless legs syndrome with nocturnal myoclonus 05/13/2013     [place] controlled   . Abnormal uterine bleeding 3151,7616  . Hypothyroidism   . Arthritis   . Osteoporosis   . History of stomach ulcers   . History of jaundice as a child   . Anemia   . Head pain, chronic     "I have pain in the back of my head for  months"  . Dizziness     TAKES MECLIZINE TO TREAT  . Hemiparesis and speech and language deficit as late effects of stroke 02/09/2015   Past Surgical History:  Past Surgical History  Procedure Laterality Date  . Back surgery      x 3  . Breast surgery Bilateral     1 lump each breast removed and benign  . Nasal sinus surgery    . Dilation and curettage of uterus  1970  . Catheterization  2003    cardiac cath  . Kyphoplasty  2010  . Cataract extraction Bilateral 2011  . Cholecystectomy  1995    Lap  . Cardiac catheterization  2002    "it was normal" per pt - no record found  . Robotic assisted salpingo oopherectomy Bilateral 11/02/2014    Procedure: ROBOTIC ASSISTED bilateral SALPINGO OOPHORECTOMY;  Surgeon: Everitt Amber, MD;  Location: [facility];  Service: Gynecology;  Laterality: Bilateral;  . Laparoscopy N/A 11/02/2014    Procedure: LAPAROSCOPY DIAGNOSTIC;  Surgeon: Everitt Amber, MD;  Location: [facility];  Service: Gynecology;  Laterality: N/A;  . Abdominal hysterectomy      Assessment & Plan Clinical Impression: Patient is a 81 y.o. right handed female with history of RLS, chronic neck pain, A fib, mild cognitive disorder, L-MCA infarct 12/2014 with mild residual HP and expressive aphasia and recurrent occipital stroke 02/18/2015 in setting of A fib and lack of anticoagulation. She was readmitted on 03/14/15 with malaise and refusal to get out of bed as well as confusion. She was started on IV rocephin for recurrent UTI and  has had improvement in mentation. X rays of lumbar and thoracic spine with diffuse degenerative changes. robaxin was scheduled to help with neck/back spasms. ST evaluation without evidence of dysphagia.     Patient transferred to CIR on 03/17/2015 .    Patient currently requires mod with basic self-care skills secondary to muscle weakness, impaired timing and sequencing, unbalanced muscle activation and decreased coordination, decreased memory and decreased standing balance  and decreased postural control.  Prior to hospitalization, patient could complete ADLs with min.  Patient will benefit from skilled intervention to decrease level of assist with basic self-care skills prior to discharge home with care partner.  Anticipate patient will require 24 hour supervision and follow up home health.  OT - End of Session Activity Tolerance: Tolerates 30+ min activity with multiple rests Endurance Deficit: Yes Endurance Deficit Description: required multiple rest breaks, orthostatic OT Assessment Rehab Potential (ACUTE ONLY): Good OT Patient demonstrates impairments in the following area(s): Balance;Cognition;Endurance;Motor;Pain;Safety OT Basic ADL's Functional Problem(s): Grooming;Bathing;Dressing;Toileting OT Transfers Functional Problem(s): Toilet;Tub/Shower OT Additional Impairment(s): Fuctional Use of Upper Extremity OT Plan OT Intensity: Minimum of 1-2 x/day, 45 to 90 minutes OT Frequency: 5 out of 7 days OT Duration/Estimated Length of Stay: 10-12 days OT Treatment/Interventions: Balance/vestibular training;Cognitive remediation/compensation;Discharge planning;Disease mangement/prevention;DME/adaptive equipment instruction;Functional mobility training;Neuromuscular re-education;Pain management;Patient/family education;Psychosocial support;Self Care/advanced ADL retraining;Therapeutic Activities;Therapeutic Exercise;UE/LE Strength taining/ROM;UE/LE Coordination activities OT Self Feeding Anticipated Outcome(s): no goal OT Basic Self-Care Anticipated Outcome(s): Supervision OT Toileting Anticipated Outcome(s): Supervision OT Bathroom Transfers Anticipated Outcome(s): Supervision OT Recommendation Patient destination: Home Follow Up Recommendations: Home health OT Equipment Recommended: To be determined   Skilled Therapeutic Intervention OT eval completed with discussion of OT purpose, POC, goals, and ELOS.  ADL assessment completed at sit > stand level with  bathing in room shower and dressing at sink.  Pt pleasant and responds well to yes/no questions but due to aphasia difficulty in completing assessment of PLOF and home setup.  Stand pivot mod assist bed > w/c with pt with posterior lean in standing requiring tactile cues for forward weight shift.  Bathing completed at sit > stand level in room shower with min/steady assist while standing with UE support on grab bar.  Educated pt on sitting to complete majority of bathing to increase safety.  Pt with one instance of reporting light headedness in shower.  Stand pivot tub bench > w/c > toilet with min/steady assist with UE support.  Dressing completed with setup assist and cues to attend to RUE when donning shirt.  Pt with c/o pain in Lt ribcage with sit > stand to pull up pants, requiring assist to pull pants over hips secondary to pain.  Ambulated short distance from sink to bed with min-mod assist.  Pt left in semi-reclined in bed at end of session with all needs in reach.  OT Evaluation Precautions/Restrictions  Precautions Precautions: Fall;Other (comment) Precaution Comments: Aphasia Restrictions Weight Bearing Restrictions: No General   Vital Signs Therapy Vitals Pulse Rate: 90 Resp: 20 BP: 123/63 mmHg Patient Position (if appropriate): Lying Oxygen Therapy SpO2: 100 % Pain Pain Assessment Pain Assessment: 0-10 Pain Score: 8  Pain Type: Chronic pain Pain Location: Back Pain Orientation: Lower;Mid Pain Onset: On-going Patients Stated Pain Goal: 6 Pain Intervention(s): Medication (See eMAR) Home Living/Prior Functioning Home Living Available Help at Discharge: Family, Friend(s), Available PRN/intermittently Type of Home: House Home Access: Stairs to enter Technical brewer of Steps: 4 Entrance Stairs-Rails: Left Home Layout: Two level, Laundry or work area in basement, Able to  live on main level with bedroom/bathroom Bathroom Shower/Tub: Gaffer, Door Bathroom  Accessibility: Yes  Lives With: Son Prior Function Level of Independence: Requires assistive device for independence, Needs assistance with ADLs  Able to Take Stairs?: Yes Driving: Yes (short distances) Comments: Previously ambulated with cane; still driving short distances ADL  See FIM Vision/Perception  Vision- History Baseline Vision/History: Wears glasses Patient Visual Report: Blurring of vision Vision- Assessment Vision Assessment?:  (difficult to assess due to impaired communication) Perception Perception: Impaired Inattention/Neglect: Does not attend to right side of body Praxis Praxis: Impaired Praxis Impairment Details: Ideomotor;Motor planning  Cognition Overall Cognitive Status: History of cognitive impairments - at baseline Arousal/Alertness: Lethargic Orientation Level: Person Year: Other (Comment) (unable to state any response, just shook head no) Month: July (stated month "7") Day of Week: Other (Comment) (unable to state, just shook head no) Comments: difficult to assess secondary to aphasia, appears pretty consistent with yes/no Sensation Sensation Light Touch: Appears Intact Proprioception: Appears Intact Coordination Fine Motor Movements are Fluid and Coordinated: No Finger Nose Finger Test: slow, undershooting with LUE Extremity/Trunk Assessment RUE Assessment RUE Assessment: Exceptions to WFL (ROM WFL, strength grossly 4/5) LUE Assessment LUE Assessment: Exceptions to WFL (ROM WFL, strength grossly 4/5)  FIM:  FIM - Grooming Grooming Steps: Wash, rinse, dry face;Wash, rinse, dry hands Grooming: 3: Patient completes 2 of 4 or 3 of 5 steps FIM - Bathing Bathing Steps Patient Completed: Chest;Right Arm;Left Arm;Abdomen;Right upper leg;Left upper leg Bathing: 3: Mod-Patient completes 5-7 [id number] parts or 50-74% FIM - Upper Body Dressing/Undressing Upper body dressing/undressing steps patient completed: Thread/unthread right bra strap;Thread/unthread  left bra strap;Thread/unthread left sleeve of pullover shirt/dress;Put head through opening of pull over shirt/dress;Pull shirt over trunk Upper body dressing/undressing: 3: Mod-Patient completed 50-74% of tasks FIM - Lower Body Dressing/Undressing Lower body dressing/undressing steps patient completed: Thread/unthread right underwear leg;Thread/unthread left underwear leg;Pull underwear up/down;Thread/unthread right pants leg;Thread/unthread left pants leg Lower body dressing/undressing: 3: Mod-Patient completed 50-74% of tasks FIM - Toileting Toileting steps completed by patient: Adjust clothing prior to toileting;Performs perineal hygiene;Adjust clothing after toileting Toileting Assistive Devices: Grab bar or rail for support Toileting: 4: Steadying assist FIM - BControl and instrumentation engineerDevices: Arm rests;Bed rails Bed/Chair Transfer: 4: Supine > Sit: Min A (steadying Pt. > 75%/lift 1 leg);4: Sit > Supine: Min A (steadying pt. > 75%/lift 1 leg);3: Bed > Chair or W/C: Mod A (lift or lower assist);3: Chair or W/C > Bed: Mod A (lift or lower assist) FIM - TAir cabin crewTransfers Assistive Devices: Grab bars;Elevated toilet seat Toilet Transfers: 3-To toilet/BSC: Mod A (lift or lower assist);4-From toilet/BSC: Min A (steadying Pt. > 75%) FIM - Tub/Shower Transfers Tub/Shower Assistive Devices: Tub transfer bench;Walk in shower;Grab bars Tub/shower Transfers: 3-Into Tub/Shower: Mod A (lift or lower/lift 2 legs);3-Out of Tub/Shower: Mod A (lift or lower/lift 2 legs)   Refer to Care Plan for Long Term Goals  Recommendations for other services: None  Discharge Criteria: Patient will be discharged from OT if patient refuses treatment 3 consecutive times without medical reason, if treatment goals not met, if there is a change in medical status, if patient makes no progress towards goals or if patient is discharged from hospital.  The above assessment, treatment  plan, treatment alternatives and goals were discussed and mutually agreed upon: by patient  HSimonne Come7/15/2016, 12:20 PM

## 2015-03-18 NOTE — IPOC Note (Addendum)
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   Embolic stroke involving left posterior cerebral artery   Sequela, post-stroke     Functional Problem List: Nursing Behavior, Endurance, Medication Management, Motor, Pain, Safety, Sensory, Skin Integrity  PT Balance, Endurance, Motor, Pain, Perception, Safety  OT Balance, Cognition, Endurance, Motor, Pain, Safety  SLP Cognition, Linguistic  TR         Basic ADL's: OT Grooming, Bathing, Dressing, Toileting     Advanced  ADL's: OT       Transfers: PT Bed Mobility, Bed to Chair, Car, Manufacturing systems engineer, Metallurgist: PT Ambulation, Emergency planning/management officer, Stairs     Additional Impairments: OT Fuctional Use of Upper Extremity  SLP Communication, Social Cognition expression Problem Solving, Attention  TR      Anticipated Outcomes Item Anticipated Outcome  Self Feeding no goal  Swallowing      Basic self-care  Media planner Transfers Supervision  Bowel/Bladder  min assist  Transfers  Supervision  Locomotion  Supervision  Communication  min assist   Cognition  min assist   Pain  <3 on a 0-10 scale  Safety/Judgment  mod assist   Therapy Plan: PT Intensity: Minimum of 1-2 x/day ,45 to 90 minutes PT Frequency: 5 out of 7 days PT Duration Estimated Length of Stay: 10-12 days OT Intensity: Minimum of 1-2 x/day, 45 to 90 minutes OT Frequency: 5 out of 7 days OT Duration/Estimated Length of Stay: 10-12 days SLP Intensity: Minumum of 1-2 x/day, 30 to 90 minutes SLP Frequency: 3 to 5 out of 7 days SLP Duration/Estimated Length of  Stay: 7-10 days        Team Interventions: Nursing Interventions Patient/Family Education, Disease Management/Prevention, Pain Management, Medication Management, Skin Care/Wound Management, Dysphagia/Aspiration Precaution Training, Discharge Planning, Psychosocial Support  PT interventions Ambulation/gait training, Training and development officer, Cognitive remediation/compensation, Discharge planning, Disease management/prevention, DME/adaptive equipment instruction, Functional mobility training, Neuromuscular re-education, Pain management, Patient/family education, Psychosocial support, Stair training, Therapeutic Activities, Therapeutic Exercise, UE/LE Strength taining/ROM, UE/LE Coordination activities, Visual/perceptual remediation/compensation, Wheelchair propulsion/positioning  OT Interventions Training and development officer, Cognitive remediation/compensation, Discharge planning, Disease mangement/prevention, DME/adaptive equipment instruction, Functional mobility training, Neuromuscular re-education, Pain management, Patient/family education, Psychosocial support, Self Care/advanced ADL retraining, Therapeutic Activities, Therapeutic Exercise, UE/LE Strength taining/ROM, UE/LE Coordination activities  SLP Interventions Cognitive remediation/compensation, Cueing hierarchy, Environmental controls, Internal/external aids, Speech/Language facilitation, Multimodal communication approach, Functional tasks, Patient/family education  TR Interventions    SW/CM Interventions      Team Discharge Planning: Destination: PT-Home ,OT- Home , SLP-Home Projected Follow-up: PT-Home health PT, 24 hour supervision/assistance, OT-  Home health OT, SLP-Home Health SLP, 24 hour supervision/assistance Projected Equipment Needs: PT-Rolling walker with 5" wheels, Wheelchair (measurements), Wheelchair cushion (measurements), To be determined, OT- To be determined, SLP-None recommended by SLP Equipment Details: PT-Needs  information from family, OT-  Patient/family involved in discharge planning: PT- Patient,  OT-Patient, SLP-Patient  MD ELOS: 10-14d Medical Rehab Prognosis:  Good Assessment: 80 y.o. female with history of RLS, chronic neck pain, A fib, mild cognitive disorder, L-MCA infarct 12/2014 with mild residual HP and expressive aphasia and recurrent occipital stroke 02/18/2015 in setting of  A fib and lack of anticoagulation. She was readmitted on 03/14/15 with malaise and refusal to get out of bed as well as confusion. She was started on IV rocephin for recurrent UTI and has had improvement in mentation. X rays of lumbar and thoracic spine with diffuse degenerative changes. robaxin was scheduled to help with neck/back spasms    Now requiring 24/7 Rehab RN,MD, as well as CIR level PT, OT and SLP.  Treatment team will focus on ADLs and mobility with goals set at Sup/Min A See Team Conference Notes for weekly updates to the plan of care

## 2015-03-19 ENCOUNTER — Inpatient Hospital Stay (HOSPITAL_COMMUNITY): Payer: Medicare Other

## 2015-03-19 ENCOUNTER — Inpatient Hospital Stay (HOSPITAL_COMMUNITY): Payer: Medicare Other | Admitting: Occupational Therapy

## 2015-03-19 ENCOUNTER — Inpatient Hospital Stay (HOSPITAL_COMMUNITY): Payer: Medicare Other | Admitting: Speech Pathology

## 2015-03-19 ENCOUNTER — Inpatient Hospital Stay (HOSPITAL_COMMUNITY): Payer: Medicare Other | Admitting: Physical Therapy

## 2015-03-19 DIAGNOSIS — I482 Chronic atrial fibrillation: Secondary | ICD-10-CM

## 2015-03-19 DIAGNOSIS — I63432 Cerebral infarction due to embolism of left posterior cerebral artery: Secondary | ICD-10-CM

## 2015-03-19 NOTE — Plan of Care (Signed)
Problem: RH BLADDER ELIMINATION Goal: RH STG MANAGE BLADDER WITH ASSISTANCE STG Manage Bladder With min Assistance  Outcome: Not Progressing In and out cath q 8

## 2015-03-19 NOTE — Progress Notes (Signed)
Occupational Therapy Session Note  Patient Details  Name: Linda [name] MRN: [id number] Date of Birth: [birth year]  Today's Date: 03/19/2015 OT Individual Time: 9476-5465 OT Individual Time Calculation (min): 44 min    Short Term Goals: Week 1:  OT Short Term Goal 1 (Week 1): Pt will complete toilet transfer with supervision with LRAD OT Short Term Goal 2 (Week 1): Pt will complete shower transfers with min assist with LRAD OT Short Term Goal 3 (Week 1): Pt will complete LB dressing with supervision OT Short Term Goal 4 (Week 1): Pt will complete 2 grooming tasks in standing with steady assist  Skilled Therapeutic Interventions/Progress Updates:  Upon entering the room, pt supine in bed with breakfast tray in front of her. Pt verbalizing "yes" she wanted to finish eating. Pt seated EOB with supervision to finish meal with SBA for seated balance. Pt performing stand pivot transfer with mod A for lifting to sit into wheelchair. Pt seated in wheelchair at sink side for dressing and bathing tasks. Pt required min- mod verbal cues for sequencing and initiation along with increased time for tasks. Sit <>stand from wheelchair with min A. Pt standing with min guard- min A for balance while performing LB washing and clothing management. Pt unable to look at yesterdays clothes and tell therapist if she had worn them or if they were dirty. Pt stating, "I don't know." Pt did begin to point to stomach and reporting pain there. Toilet transfer with mod A onto elevated toilet with use of grab bar. Pt unable to void this session. Pt performing clothing management and hygiene with steady assist before returning to wheelchair. QRB donned and pt propelled to RN station for supervision and safety.   Therapy Documentation Precautions:  Precautions Precautions: Fall, Other (comment) Precaution Comments: Aphasia Restrictions Weight Bearing Restrictions: No Vital Signs: Therapy Vitals Temp: 97.4 F (36.3  C) Temp Source: Oral Pulse Rate: 81 Resp: (!) 30 (pt in no distress, return from bathroom) BP: 125/83 mmHg Patient Position (if appropriate): Lying Oxygen Therapy SpO2: 100 % O2 Device: Not Delivered  See FIM for current functional status  Therapy/Group: Individual Therapy  Phineas Semen 03/19/2015, 8:45 AM

## 2015-03-19 NOTE — Progress Notes (Signed)
Physical Therapy Session Note  Patient Details  Name: Linda [name] MRN: [id number] Date of Birth: Jun 10, 1929  Today's Date: 03/19/2015 PT Individual Time: 1405-1450 PT Individual Time Calculation (min): 45 min   Short Term Goals: Week 1:  PT Short Term Goal 1 (Week 1): Pt will perform bed mobility with Min A supine>sit logrolling PT Short Term Goal 2 (Week 1): Pt will perform basic squat-pivot transfers with Min A consistently PT Short Term Goal 3 (Week 1): Pt will perform gait x50' with RW and Min A PT Short Term Goal 4 (Week 1): Pt will perform 4 stairs with 1 rail and Min A PT Short Term Goal 5 (Week 1): Pt will tolerate static standing during functional task x3 min for increased activity tolerance.   Skilled Therapeutic Interventions/Progress Updates:    Pt received semi reclined in bed; awake and agreeable to therapy. Session focused on gait training, activity tolerance. Pt performed supine > sit with HOB flat using rail with supervision. Pt required cueing for hand placement during sit > stand transfers from bed and w/c with RW with 25% within-session carryover. Pt consistently required cueing or controlled descent during stand > sit.  Pt performed w/c mobility x25' in controlled environment with B LE's, demonstration cueing for technique; w/c propulsion trial ended due to pt fatigue. Transported pt remaining distance to gym with total A for energy conservation. Completed Timed Up and Go Test (TUG) using RW with time of 119 seconds. During TUG, pt required max multimodal cues to increase B step length (limitation on R > LLE); festinating gait pattern more pronounced during turning.   Remainder of session focused on gait training. Placed blue Tband around bottom of RW as visual cue to increase B step length to improve foot clearance, increase gait stability. Pt then performed gait x68' in controlled environment with RW and min A for majority of trial, mod A to recover from significant  LOB x1 to R side. Visual cue effective only when verbal reminders provided every 5-10 seconds during gait trial. See below for gait impairments/assist required. Session ended in pt room, where pt elected to return to bed despite coaxing/education on importance of increasing upright tolerance. Departed with pt semi reclined in bed with 3 rails up, bed alarm on, and all needs within reach.  Therapy Documentation Precautions:  Precautions Precautions: Fall, Other (comment) Precaution Comments: Aphasia Restrictions Weight Bearing Restrictions: No Vital Signs: Therapy Vitals Temp: 97.4 F (36.3 C) Temp Source: Oral Pulse Rate: 89 Resp: 20 BP: (!) 96/59 mmHg (RN notified) Patient Position (if appropriate): Sitting Oxygen Therapy SpO2: 100 % O2 Device: Not Delivered Pain: Pain Assessment Pain Assessment: 0-10 Pain Score: 0-No pain Pain Type: Acute pain Pain Location: Neck Pain Orientation: Posterior Pain Descriptors / Indicators: Grimacing;Guarding Pain Onset: Unable to tell Pain Intervention(s): RN made aware Locomotion : Ambulation Ambulation: Yes Ambulation/Gait Assistance: 4: Min assist;3: Mod assist Ambulation Distance (Feet): 68 Feet Assistive device: Rolling walker Ambulation/Gait Assistance Details: Visual cues for safe use of DME/AE;Verbal cues for gait pattern;Tactile cues for weight shifting (Visual cueing for gait pattern) Gait Gait: Yes Gait Pattern: Impaired Gait Pattern: Step-through pattern;Step-to pattern;Poor foot clearance - right;Decreased stance time - right;Decreased dorsiflexion - right;Decreased weight shift to right;Trunk flexed;Festinating;Shuffle;Decreased hip/knee flexion - right (Step-through during linear gait; step-to during turns) Wheelchair Mobility Distance: 25  Balance: Standardized Balance Assessment Standardized Balance Assessment: Timed Up and Go Test Timed Up and Go Test TUG: Normal TUG Normal TUG (seconds): 119  See FIM for  current  functional status  Therapy/Group: Individual Therapy  Stefano Gaul 03/19/2015, 4:42 PM

## 2015-03-19 NOTE — Progress Notes (Signed)
Speech Language Pathology Daily Session Note  Patient Details  Name: Linda [name] MRN: [id number] Date of Birth: [birth year]  Today's Date: 03/19/2015 SLP Individual Time: 0930-1030 SLP Individual Time Calculation (min): 60 min  Short Term Goals: Week 1: SLP Short Term Goal 1 (Week 1): Pt will name basic, familiar objects with 75% accuracy during functional tasks with mod assist verbal and visual cues. SLP Short Term Goal 2 (Week 1): Pt will recognize and correct verbal errors during basic, structured tasks with mod-max assist verbal and visual cues over 75% of observable opportunities   SLP Short Term Goal 3 (Week 1): Pt will complete basic, familiar self care tasks with mod assist multimodal cues for functional problem solving over 75% of observable opportunities.  SLP Short Term Goal 4 (Week 1): Pt will initiate verbalizations and/or tasks with mod assist multimodal cues over 75% of observable opportunities.   Skilled Therapeutic Interventions:   Skilled treatment session focused on addressing cognitive-linguistic goals. Patient participated in a confrontational naming task with familiar objects and was 40% accurate.  When SLP facilitated session by providing sentence completion and phonemic cues patient's accuracy increased to 70%. Patient required Max multimodal cues to augment verbal expression with visual/gestural cues to express basic need for pain medication.  RN administered pain medication and patient was observed to have difficulty with observed throat clearing and multiple swallows; as a result, pill whole in puree was attempted followed by thin liquids which resulted in reduced throat clears.  Recommend medication be administered whole in puree with thin liquid washes.  SLP also facilitated session with Max multimodal cues for initiation of and basic problem solving related to what to do as a result of her runny nose; however, once patient was set up and provided with a demonstration  cues she was able to complete task.  Continue with current plan of care.     FIM:  Comprehension Comprehension Mode: Auditory Comprehension: 3-Understands basic 50 - 74% of the time/requires cueing 25 - 50%  of the time Expression Expression Mode: Verbal Expression: 2-Expresses basic 25 - 49% of the time/requires cueing 50 - 75% of the time. Uses single words/gestures. Social Interaction Social Interaction: 4-Interacts appropriately 75 - 89% of the time - Needs redirection for appropriate language or to initiate interaction. Problem Solving Problem Solving: 3-Solves basic 50 - 74% of the time/requires cueing 25 - 49% of the time Memory Memory: 2-Recognizes or recalls 25 - 49% of the time/requires cueing 51 - 75% of the time FIM - Eating Eating Activity: 5: Supervision/cues  Pain Pain Assessment Pain Assessment: 0-10 Pain Score: 7  Pain Type: Acute pain Pain Location: Abdomen Pain Orientation: Left;Lower Pain Descriptors / Indicators: Grimacing Pain Onset: Gradual Pain Intervention(s): RN made aware Multiple Pain Sites: No  Therapy/Group: Individual Therapy  Carmelia Roller., CCC-SLP [phone]  [place] 03/19/2015, 9:58 AM

## 2015-03-19 NOTE — Progress Notes (Signed)
Linda [name] is a 81 y.o. female May 13, 1929 [id number]  Subjective: No new complaints. No new problems. Slept well. Feeling OK.  Objective: Vital signs in last 24 hours: Temp:  [97.4 F (36.3 C)-97.8 F (36.6 C)] 97.4 F (36.3 C) (07/16 0541) Pulse Rate:  [60-99] 81 (07/16 0541) Resp:  [16-30] 30 (07/16 0541) BP: (98-125)/(55-83) 125/83 mmHg (07/16 0541) SpO2:  [99 %-100 %] 100 % (07/16 0541) Weight change:  Last BM Date: 03/18/15  Intake/Output from previous day: 07/15 0701 - 07/16 0700 In: 54 [P.O.:460] Out: 250 [Urine:250]  Physical Exam General: No apparent distress    Lungs: Normal effort. Lungs clear to auscultation, no crackles or wheezes. Cardiovascular: irregular rate and rhythm, no edema Neurological: No new neurological deficits Wounds: N/A      Lab Results: BMET    Component Value Date/Time   NA 135 03/18/2015 0848   NA 139 02/02/2015 1248   K 4.2 03/18/2015 0848   CL 101 03/18/2015 0848   CO2 25 03/18/2015 0848   GLUCOSE 155* 03/18/2015 0848   BUN 19 03/18/2015 0848   BUN 19 02/02/2015 1248   CREATININE 0.72 03/18/2015 0848   CREATININE 1.0 02/02/2015 1248   CALCIUM 8.6* 03/18/2015 0848   GFRNONAA >60 03/18/2015 0848   GFRAA >60 03/18/2015 0848   CBC    Component Value Date/Time   WBC 9.2 03/18/2015 0848   WBC 5.6 02/02/2015 1248   RBC 4.48 03/18/2015 0848   RBC 3.71* 05/29/2009 1520   HGB 13.8 03/18/2015 0848   HCT 41.6 03/18/2015 0848   PLT 349 03/18/2015 0848   MCV 92.9 03/18/2015 0848   MCH 30.8 03/18/2015 0848   MCHC 33.2 03/18/2015 0848   RDW 12.9 03/18/2015 0848   LYMPHSABS 0.9 03/18/2015 0848   MONOABS 1.1* 03/18/2015 0848   EOSABS 0.1 03/18/2015 0848   BASOSABS 0.1 03/18/2015 0848   CBG's (last 3):  No results for input(s): GLUCAP in the last 72 hours. LFT's Lab Results  Component Value Date   ALT 28 03/18/2015   AST 28 03/18/2015   ALKPHOS 119 03/18/2015   BILITOT 0.6 03/18/2015    Studies/Results: No results  found.  Medications:  I have reviewed the patient's current medications. Scheduled Medications: . apixaban  2.5 mg Oral BID  . levothyroxine  50 mcg Oral QAC breakfast  . methocarbamol  500 mg Oral TID  . metoprolol tartrate  25 mg Oral BID  . rOPINIRole  0.25 mg Oral TID  . senna-docusate  2 tablet Oral Daily  . sertraline  50 mg Oral Daily  . 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 Functional deficits secondary to Subacute left PCA distribution infarct superimposed on previous left MCA infarct causing aphasia as well as balance deficits - continue ongoing IP rehab 2. A fib/DVT Prophylaxis/Anticoagulation: Pharmaceutical: Eliquis 3. Pain Management: Continue scheduled robaxin for chronic neck pain. Will also schedule tylenol and add Sportscreme to help with pain control. 4. Mood: LCSW to follow and assist patient and family with issues.  5. Neuropsych: This  patient is not capable of making decisions on her own behalf. 6. Skin/Wound Care: Routine pressure relief measures. Elevated feet when in bed.  7. Fluids/Electrolytes/Nutrition: Monitor I/O. Offer supplements between meals.  8. A fib: Monitor heart rate bid. Continue lopressor bid.  9.?  UTI: Urine culture negative--. Off antibiotics; monitor. Push po fluids.  10. Conjunctivitis: Warm moist compresses with Tobradex eye drops qid.  11. RLS: continue Requip tid.   Length of stay,  days: 2  Nikolaos Maddocks A. Asa Lente, MD 03/19/2015, 10:50 AM

## 2015-03-19 NOTE — Progress Notes (Signed)
Occupational Therapy Session Note  Patient Details  Name: Linda [name] MRN: [id number] Date of Birth: 1929-05-03  Today's Date: 03/19/2015 OT Individual Time: 1300-1345 OT Individual Time Calculation (min): 45 min    Short Term Goals: Week 1:  OT Short Term Goal 1 (Week 1): Pt will complete toilet transfer with supervision with LRAD OT Short Term Goal 2 (Week 1): Pt will complete shower transfers with min assist with LRAD OT Short Term Goal 3 (Week 1): Pt will complete LB dressing with supervision OT Short Term Goal 4 (Week 1): Pt will complete 2 grooming tasks in standing with steady assist  Skilled Therapeutic Interventions/Progress Updates:    Pt seen for 1:1 OT session with a focus on dynamic standing balance, RUE NMR, functional transfers, activity tolerance, and safety awareness. Pt received supine in bed asleep agreeable to participate in therapy session. Pt completed bed mobility with min A and transferred EOB>w/c via stand pivot with min A. Pt requesting to toilet and completed stand pivot w/c<>toilet with min A. Pt unable to void this session. Pt required steadying A while completing clothing management in standing. Pt transferred to small gym space to complete RUE peg activity in standing.Pt completed sit<>stand with min A and required steadying A once in standing.  Pt completed activity for 6 min before fatigue and demonstrated good use of RUE with mod cues for in-hand manipulation and completion of copy-design task. Pt then engaged in standing balance activity of clothespins on vertical surface to facilitate upright posture. Pt completed task 2x for 5 min each and required encouragement to utilize RUE on more resistive clips due to mild frustration. Overall, pt demonstrated mod anxiety and flat affect during session. Pt returned to room and left seated in w/c with RN present. QRB donned and pt left with all needs in reach.   Therapy Documentation Precautions:   Precautions Precautions: Fall, Other (comment) Precaution Comments: Aphasia Restrictions Weight Bearing Restrictions: No General:   Vital Signs:  Pain: Pain Assessment Pain Score: 2  ADL:   Exercises:   Other Treatments:    See FIM for current functional status  Therapy/Group: Individual Therapy  Dorann Ou 03/19/2015, 2:19 PM

## 2015-03-20 ENCOUNTER — Inpatient Hospital Stay (HOSPITAL_COMMUNITY): Payer: Medicare Other | Admitting: Physical Therapy

## 2015-03-20 MED ORDER — METOPROLOL TARTRATE 25 MG PO TABS
25.0000 mg | ORAL_TABLET | Freq: Two times a day (BID) | ORAL | Status: DC
  Administered 2015-03-20 – 2015-03-25 (×7): 25 mg via ORAL
  Filled 2015-03-20 (×12): qty 1

## 2015-03-20 NOTE — Progress Notes (Signed)
Linda [name] is a 81 y.o. female April 22, 1929 [id number]  Subjective: No new complaints. Indicates neck discomfort but no other new problems.  RN notes orthostatic vitals with low BP this AM  Objective: Vital signs in last 24 hours: Temp:  [97.4 F (36.3 C)-97.9 F (36.6 C)] 97.9 F (36.6 C) (07/17 0528) Pulse Rate:  [78-89] 78 (07/17 0528) Resp:  [18-20] 18 (07/17 0528) BP: (96-109)/(57-67) 109/67 mmHg (07/17 0528) SpO2:  [99 %-100 %] 99 % (07/17 0528) Weight change:  Last BM Date: 03/18/15 (after suppository)  Intake/Output from previous day: 07/16 0701 - 07/17 0700 In: 240 [P.O.:240] Out: 325 [Urine:325]  Physical Exam General: Uncomfortable holding posterior neck (chronic); No other apparent distress    Lungs: Normal effort. Lungs clear to auscultation, no crackles or wheezes. Cardiovascular: irregular rate and rhythm, no edema Neurological: No new neurological deficits   Lab Results: BMET    Component Value Date/Time   NA 135 03/18/2015 0848   NA 139 02/02/2015 1248   K 4.2 03/18/2015 0848   CL 101 03/18/2015 0848   CO2 25 03/18/2015 0848   GLUCOSE 155* 03/18/2015 0848   BUN 19 03/18/2015 0848   BUN 19 02/02/2015 1248   CREATININE 0.72 03/18/2015 0848   CREATININE 1.0 02/02/2015 1248   CALCIUM 8.6* 03/18/2015 0848   GFRNONAA >60 03/18/2015 0848   GFRAA >60 03/18/2015 0848   CBC    Component Value Date/Time   WBC 9.2 03/18/2015 0848   WBC 5.6 02/02/2015 1248   RBC 4.48 03/18/2015 0848   RBC 3.71* 05/29/2009 1520   HGB 13.8 03/18/2015 0848   HCT 41.6 03/18/2015 0848   PLT 349 03/18/2015 0848   MCV 92.9 03/18/2015 0848   MCH 30.8 03/18/2015 0848   MCHC 33.2 03/18/2015 0848   RDW 12.9 03/18/2015 0848   LYMPHSABS 0.9 03/18/2015 0848   MONOABS 1.1* 03/18/2015 0848   EOSABS 0.1 03/18/2015 0848   BASOSABS 0.1 03/18/2015 0848   CBG's (last 3):  No results for input(s): GLUCAP in the last 72 hours. LFT's Lab Results  Component Value Date   ALT 28  03/18/2015   AST 28 03/18/2015   ALKPHOS 119 03/18/2015   BILITOT 0.6 03/18/2015    Studies/Results: No results found.  Medications:  I have reviewed the patient's current medications. Scheduled Medications: . apixaban  2.5 mg Oral BID  . levothyroxine  50 mcg Oral QAC breakfast  . methocarbamol  500 mg Oral TID  . metoprolol tartrate  25 mg Oral BID  . rOPINIRole  0.25 mg Oral TID  . senna-docusate  2 tablet Oral Daily  . sertraline  50 mg Oral Daily  . 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 Functional deficits secondary to Subacute left PCA distribution infarct superimposed on previous left MCA infarct causing aphasia as well as balance deficits - continue ongoing IP rehab 2. A fib/DVT Prophylaxis/Anticoagulation: Pharmaceutical: Eliquis 3. Pain Management: Continue scheduled robaxin for chronic neck pain. And scheduled tylenol plus Sportscreme to help with pain control. 4. Mood: LCSW  to follow and assist patient and family with issues.  5. Neuropsych: This patient is not capable of making decisions on her own behalf. 6. Skin/Wound Care: Routine pressure relief measures. Elevated feet when in bed.  7. Fluids/Electrolytes/Nutrition: Monitor I/O. Offer supplements between meals.  8. A fib: Monitor heart rate bid. Continue lopressor bid BUT ok to hold for SBP<100 9.? UTI: Urine culture negative--. Off antibiotics; monitor. Push po  fluids.  10. Conjunctivitis: Warm moist compresses with Tobradex eye drops qid.  11. RLS: continue Requip tid.   Length of stay, days: 3  Valerie A. Asa Lente, MD 03/20/2015, 8:46 AM

## 2015-03-20 NOTE — Progress Notes (Signed)
Physical Therapy Session Note  Patient Details  Name: Linda [name] MRN: [id number] Date of Birth: 07-05-1929  Today's Date: 03/20/2015 PT Individual Time: 1015-1045 PT Individual Time Calculation (min): 30 min   Short Term Goals: Week 1:  PT Short Term Goal 1 (Week 1): Pt will perform bed mobility with Min A supine>sit logrolling PT Short Term Goal 2 (Week 1): Pt will perform basic squat-pivot transfers with Min A consistently PT Short Term Goal 3 (Week 1): Pt will perform gait x50' with RW and Min A PT Short Term Goal 4 (Week 1): Pt will perform 4 stairs with 1 rail and Min A PT Short Term Goal 5 (Week 1): Pt will tolerate static standing during functional task x3 min for increased activity tolerance.   Skilled Therapeutic Interventions/Progress Updates:  Pt was seen bedside in the pm. Pt transferred supine to edge of bed with head of bed elevated and S. Pt transferred sit to stand multiple times with min A and verbal cues. Pt ambulated with rolling walker, 100 and 110 feet with min A and verbal cues. Pt ambulated without assistive device about 140 feet with min to mod A and verbal cues. Pt ambulates with shuffling gait and wide BOS. Pt returned to room and left sitting up in w/c with quick release belt in place and call bell within reach.   Therapy Documentation Precautions:  Precautions Precautions: Fall, Other (comment) Precaution Comments: Aphasia Restrictions Weight Bearing Restrictions: No General:  Pain: No c/o pain.    Locomotion : Ambulation Ambulation/Gait Assistance: 4: Min assist   See FIM for current functional status  Therapy/Group: Individual Therapy  Dub Amis 03/20/2015, 12:56 PM

## 2015-03-21 ENCOUNTER — Inpatient Hospital Stay (HOSPITAL_COMMUNITY): Payer: Medicare Other

## 2015-03-21 ENCOUNTER — Inpatient Hospital Stay (HOSPITAL_COMMUNITY): Payer: Medicare Other | Admitting: Speech Pathology

## 2015-03-21 ENCOUNTER — Inpatient Hospital Stay (HOSPITAL_COMMUNITY): Payer: Medicare Other | Admitting: Occupational Therapy

## 2015-03-21 NOTE — Progress Notes (Signed)
Speech Language Pathology Daily Session Note  Patient Details  Name: Linda [name] MRN: [id number] Date of Birth: [birth year]  Today's Date: 03/21/2015 SLP Individual Time: 0800-0900 SLP Individual Time Calculation (min): 60 min  Short Term Goals: Week 1: SLP Short Term Goal 1 (Week 1): Pt will name basic, familiar objects with 75% accuracy during functional tasks with mod assist verbal and visual cues. SLP Short Term Goal 2 (Week 1): Pt will recognize and correct verbal errors during basic, structured tasks with mod-max assist verbal and visual cues over 75% of observable opportunities   SLP Short Term Goal 3 (Week 1): Pt will complete basic, familiar self care tasks with mod assist multimodal cues for functional problem solving over 75% of observable opportunities.  SLP Short Term Goal 4 (Week 1): Pt will initiate verbalizations and/or tasks with mod assist multimodal cues over 75% of observable opportunities.   Skilled Therapeutic Interventions:  Pt was seen for skilled ST targeting cognitive-linguistic goals.  Upon arrival, pt was seated upright in bed consuming breakfast, awake, alert, and agreeable to participate in ST.  Pt indicated that she had finished eating breakfast and wanted to save her coffee via yes/no responses to SLP questions and with min assist verbal and visual cues.  Pt was transferred to wheelchair to maximize attention and alertness during structured therapeutic tasks.  SLP facilitated the session with structured naming drills targeting word finding for basic, familiar objects.  Pt was able to receptively identify an object when named from a field of three with 100% accuracy and supervision cues; however, she had greater difficulty when matching written word to object from a field of three and required max assist multimodal cues for <75% accuracy.  Pt's performance during the abovementioned task indicates stronger auditory comprehension in comparison to reading comprehension  at the word level.  SLP also facilitated the session with a confrontational naming task for objects in pictures.  Pt identified basic objects in photographs with mod-max assist verbal and visual cues for phonemic placement and to increase awareness of verbal errors (primarily substitutions, cluster reductions, transpositions, and perseveration). Pt was able to repeat 2-3 word phrases with overall min-mod assist verbal cues for phonemic placement; however, with longer sentences she required up to max assist multimodal cues.  Pt was returned to room and transferred back to bed.  All needs were left within reach and bed alarm was activated.  Continue per current plan of care.       FIM:  Comprehension Comprehension Mode: Auditory Comprehension: 3-Understands basic 50 - 74% of the time/requires cueing 25 - 50%  of the time Expression Expression Mode: Verbal Expression: 2-Expresses basic 25 - 49% of the time/requires cueing 50 - 75% of the time. Uses single words/gestures. Social Interaction Social Interaction: 4-Interacts appropriately 75 - 89% of the time - Needs redirection for appropriate language or to initiate interaction. Problem Solving Problem Solving: 3-Solves basic 50 - 74% of the time/requires cueing 25 - 49% of the time Memory Memory: 2-Recognizes or recalls 25 - 49% of the time/requires cueing 51 - 75% of the time  Pain Pain Assessment Pain Assessment: Faces Faces Pain Scale: No hurt  Therapy/Group: Individual Therapy  Li Bobo, Selinda Orion 03/21/2015, 11:08 AM

## 2015-03-21 NOTE — Progress Notes (Signed)
Occupational Therapy Session Note  Patient Details  Name: Linda [name] MRN: [id number] Date of Birth: Mar 08, 1929  Today's Date: 03/21/2015 OT Individual Time: 2549-8264 OT Individual Time Calculation (min): 60 min    Short Term Goals: Week 1:  OT Short Term Goal 1 (Week 1): Pt will complete toilet transfer with supervision with LRAD OT Short Term Goal 2 (Week 1): Pt will complete shower transfers with min assist with LRAD OT Short Term Goal 3 (Week 1): Pt will complete LB dressing with supervision OT Short Term Goal 4 (Week 1): Pt will complete 2 grooming tasks in standing with steady assist  Skilled Therapeutic Interventions/Progress Updates:    Pt seen for BADL retraining with a focus on use of RUE, dynamic balance and activity tolerance.  Pt received in bed and was able to sit to EOB with no A. Pt able to answer all questions with a head nod and used a clear verbal "yes" 4x.  She said "that is fine 1x". Overall, pt needed steadying A for all transfers and standing  Balance. Pt able to stand in shower with support and stood at sink for 5 minutes to groom. She is able to cross legs to wash feet and don socks and shoes.  Good use of RUE with all tasks. Pt in room with quick release belt and and call light in reach.  Therapy Documentation Precautions:  Precautions Precautions: Fall, Other (comment) Precaution Comments: Aphasia Restrictions Weight Bearing Restrictions: No    Pain: Pain Assessment Pain Assessment: Faces Faces Pain Scale: No hurt ADL:  See FIM for current functional status  Therapy/Group: Individual Therapy  [place] 03/21/2015, 12:38 PM

## 2015-03-21 NOTE — Progress Notes (Signed)
Social Work Assessment and Plan Social Work Assessment and Plan  Patient Details  Name: Linda [name] MRN: [id number] Date of Birth: [birth year]  Today's Date: 03/21/2015  Problem List:  Patient Active Problem List   Diagnosis Date Noted  . Apraxia complicating stroke 76/54/6503  . 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 Embolic stroke involving left posterior cerebral artery 03/17/2015  . Sequela, post-stroke 03/17/2015  . Encephalopathy 03/14/2015  . UTI (lower urinary tract infection) 03/14/2015  . Altered mental state 03/14/2015  . Chest pain 03/14/2015  . Back pain   . Constipation   . CVA (cerebral infarction) 02/18/2015  . Hemiparesis and speech and language deficit as late effects of stroke 02/09/2015  . History of upper gastrointestinal bleeding   . Long-term (current) use of anticoagulants   . Protein-calorie malnutrition, severe 12/27/2014  . Paroxysmal atrial fibrillation   . Left middle cerebral artery stroke 12/25/2014  . Essential hypertension 12/25/2014  . Dysphagia, pharyngoesophageal phase 12/25/2014  . Hypothyroidism 12/25/2014  . Restless legs syndrome with nocturnal myoclonus 05/13/2013   Past Medical History:  Past Medical History  Diagnosis Date  . History of cardiac catheterization 2002    Negative  . Vertebral fracture   . Restless legs syndrome with nocturnal myoclonus 05/13/2013     requip controlled   . Abnormal uterine bleeding 5465,6812  . Hypothyroidism   . Arthritis   . Osteoporosis   . History of stomach ulcers   . History of jaundice as a child   . Anemia   . Head pain, chronic     "I have pain in the back of my head for months"  . Dizziness     TAKES MECLIZINE TO TREAT  . Hemiparesis and speech and language deficit as late effects of stroke 02/09/2015   Past Surgical History:  Past Surgical History  Procedure Laterality Date  . Back surgery      x 3  . Breast surgery Bilateral      1 lump each breast removed and benign  . Nasal sinus surgery    . Dilation and curettage of uterus  1970  . Catheterization  2003    cardiac cath  . Kyphoplasty  2010  . Cataract extraction Bilateral 2011  . Cholecystectomy  1995    Lap  . Cardiac catheterization  2002    "it was normal" per pt - no record found  . Robotic assisted salpingo oopherectomy Bilateral 11/02/2014    Procedure: ROBOTIC ASSISTED bilateral SALPINGO OOPHORECTOMY;  Surgeon: Everitt Amber, MD;  Location: [facility];  Service: Gynecology;  Laterality: Bilateral;  . Laparoscopy N/A 11/02/2014    Procedure: LAPAROSCOPY DIAGNOSTIC;  Surgeon: Everitt Amber, MD;  Location: [facility];  Service: Gynecology;  Laterality: N/A;  . Abdominal hysterectomy     Social History:  reports that she has never smoked. She has never used smokeless tobacco. She reports that she does not drink alcohol or use illicit drugs.  Family / Support Systems Marital Status: Widow/Widower Patient Roles: Parent Children: [phone]-home  [phone] work  [phone] Other Supports: Mark-son [phone]   Tim-son [phone] Anticipated Caregiver: sons and looking to hire caregivers Ability/Limitations of Caregiver: Sons work but will hire caregiver to assist her Caregiver Availability: Other (Comment) (Working on discharge plan) Family Dynamics: Close knit family, pt has three son';s all are involved and supportive and willing to assist her. Micheal with which she lives provides the most care and is there the most. All want  her to remain where she wants to be.  Social History Preferred language: English Religion: Baptist Cultural Background: No issues Education: [employer] Read: Yes Write: Yes Employment Status: Retired Freight forwarder Issues: No issues Guardian/Conservator: None-according to MD pt is not capable of making her own decisions while here.  Will look toward her son's not formal POA in place   Abuse/Neglect Physical  Abuse: Denies Verbal Abuse: Denies Sexual Abuse: Denies Exploitation of patient/patient's resources: Denies Self-Neglect: Denies  Emotional Status Pt's affect, behavior adn adjustment status: Pt is limted in her communication due to her stroke. She does nod and has accurate yes/no. She has always taken care of herself and wants to be able to do this again. She had a stroke in April and recoverd well enough she could do most things for herself. She will work hard and see how much progress she can make while here. Recent Psychosocial Issues: Recent CVA in April was recovering from this. Pyschiatric History: No history deferred depression screen due to inability to communicate, but will follow along and ask input from team regarding her coping. Substance Abuse History: No issues  Patient / Family Perceptions, Expectations & Goals Pt/Family understanding of illness & functional limitations: Son can explain her stroke and deficits, he is hopeful she will do well here.  Pt is able to nod about having another stroke.  Son's have spoken to the MD and feel they have a good understnaidng o her condition. Aware she will need someone with her due to communication issues and deficits. Premorbid pt/family roles/activities: Mother, retiree, grandmother, home owner, church member, etc Anticipated changes in roles/activities/participation: resume Pt/family expectations/goals: Pt nods when discussing stroke and wanting to go home at discharge. Son states: " I will do what I can but I work and need some help with what to do."  [employer]: Other (Comment) (been to NH before-[facility]) Premorbid Home Care/DME Agencies: Other (Comment) ([facility]) Transportation available at discharge: Sons have been providing transportation since last stroke Resource referrals recommended: Support group (specify)  Discharge Planning Living Arrangements: Children Support Systems: Children,  Water engineer, Social worker community Type of Residence: Private residence Insurance Resources: [employer], Multimedia programmer (specify) Web designer) Financial Resources: [place] Referred: No Living Expenses: Own Money Management: Family Does the patient have any problems obtaining your medications?: No Home Management: Son does home management now Patient/Family Preliminary Plans: Son is trying to hire someone to stay with pt while he works, will give son list fo private duty agencies. He was not aware it was private pay for this. He is aware pt will need atleast supervision level at discharge. May need short term NHP again before going home, will await progress. Social Work Anticipated Follow Up Needs: HH/OP, SNF, Support Group  Clinical Impression Pleasant female who is willing to work hard in therapies. Supportive son's but all work during the day.  Aware will need 24 hr supervision at discharge due to communication and safety issues. Will assist son in discharge planning and work on the most realistic discharge goal for pt.    Elease Hashimoto 03/21/2015, 10:51 AM

## 2015-03-21 NOTE — Progress Notes (Signed)
Patient information reviewed and entered into eRehab system by Yaneli Keithley, RN, CRRN, PPS Coordinator.  Information including medical coding and functional independence measure will be reviewed and updated through discharge.     Per nursing patient was given "Data Collection Information Summary for Patients in Inpatient Rehabilitation Facilities with attached "Privacy Act Statement-Health Care Records" upon admission.  

## 2015-03-21 NOTE — Progress Notes (Signed)
Speech Language Pathology Daily Session Note  Patient Details  Name: Linda [name] MRN: [id number] Date of Birth: 22-Mar-1929  Today's Date: 03/21/2015 SLP Individual Time: 6754-4920 SLP Individual Time Calculation (min): 30 min  Short Term Goals: Week 1: SLP Short Term Goal 1 (Week 1): Pt will name basic, familiar objects with 75% accuracy during functional tasks with mod assist verbal and visual cues. SLP Short Term Goal 2 (Week 1): Pt will recognize and correct verbal errors during basic, structured tasks with mod-max assist verbal and visual cues over 75% of observable opportunities   SLP Short Term Goal 3 (Week 1): Pt will complete basic, familiar self care tasks with mod assist multimodal cues for functional problem solving over 75% of observable opportunities.  SLP Short Term Goal 4 (Week 1): Pt will initiate verbalizations and/or tasks with mod assist multimodal cues over 75% of observable opportunities.   Skilled Therapeutic Interventions: Skilled treatment session focused on cognitive-linguistic goals. Upon arrival, patient was awake while sitting upright in bed but did not verbally respond to clinician's greetings or basic yes/no questions. SLP facilitated session with naming task of functional items. Patient independently named items with 66% accuracy and 100% accuracy with Mod phonemic and sentence completion cues.  Patient also matched the written word to the functional object with 85% from a field of two.  Patient left semi-reclined in bed with alarm on and all needs within reach. Continue with current plan of care.    FIM:  Comprehension Comprehension Mode: Auditory Comprehension: 3-Understands basic 50 - 74% of the time/requires cueing 25 - 50%  of the time Expression Expression Mode: Verbal Expression: 2-Expresses basic 25 - 49% of the time/requires cueing 50 - 75% of the time. Uses single words/gestures. Social Interaction Social Interaction: 4-Interacts appropriately 75  - 89% of the time - Needs redirection for appropriate language or to initiate interaction. Problem Solving Problem Solving: 3-Solves basic 50 - 74% of the time/requires cueing 25 - 49% of the time Memory Memory: 2-Recognizes or recalls 25 - 49% of the time/requires cueing 51 - 75% of the time  Pain Pain Assessment Pain Assessment: No/denies pain  Therapy/Group: Individual Therapy  Nil Bolser 03/21/2015, 3:51 PM

## 2015-03-21 NOTE — Care Management Note (Signed)
[facility] Individual Statement of Services  Patient Name:  Linda [name]  Date:  03/21/2015  Welcome to the [facility].  Our goal is to provide you with an individualized program based on your diagnosis and situation, designed to meet your specific needs.  With this comprehensive rehabilitation program, you will be expected to participate in at least 3 hours of rehabilitation therapies Monday-Friday, with modified therapy programming on the weekends.  Your rehabilitation program will include the following services:  Physical Therapy (PT), Occupational Therapy (OT), Speech Therapy (ST), 24 hour per day rehabilitation nursing, Therapeutic Recreaction (TR), Case Management (Social Worker), Rehabilitation Medicine, Nutrition Services and Pharmacy Services  Weekly team conferences will be held on Wednesday to discuss your progress.  Your Social Worker will talk with you frequently to get your input and to update you on team discussions.  Team conferences with you and your family in attendance may also be held.  Expected length of stay: 10-12 days Overall anticipated outcome: supervision level  Depending on your progress and recovery, your program may change. Your Social Worker will coordinate services and will keep you informed of any changes. Your Social Worker's name and contact numbers are listed  below.  The following services may also be recommended but are not provided by the [facility]:    [facility] will be made to provide these services after discharge if needed.  Arrangements include referral to agencies that provide these services.  Your insurance has been verified to be:  Medicare & [place] Your primary doctor is:  Deland Pretty  Pertinent information will be shared with your doctor and your insurance company.  Social Worker:  Ovidio Kin, [place] or ([phone]  Information discussed with and copy given to patient by: Elease Hashimoto, 03/21/2015, 10:29 AM

## 2015-03-21 NOTE — Progress Notes (Signed)
Physical Therapy Session Note  Patient Details  Name: Linda [name] MRN: [id number] Date of Birth: Aug 02, 1929  Today's Date: 03/21/2015 PT Individual Time: 1100-1205 PT Individual Time Calculation (min): 65 min   Short Term Goals: Week 1:  PT Short Term Goal 1 (Week 1): Pt will perform bed mobility with Min A supine>sit logrolling PT Short Term Goal 2 (Week 1): Pt will perform basic squat-pivot transfers with Min A consistently PT Short Term Goal 3 (Week 1): Pt will perform gait x50' with RW and Min A PT Short Term Goal 4 (Week 1): Pt will perform 4 stairs with 1 rail and Min A PT Short Term Goal 5 (Week 1): Pt will tolerate static standing during functional task x3 min for increased activity tolerance.   Skilled Therapeutic Interventions/Progress Updates:   Pt conveyed that she was in pain, L low back; consulted RN who stated pt had had scheduled meds; RN dispensed further pain meds during session. O2 sats 96%, HR 82 bbm.  Therapeutic exercise performed with LE to increase strength for functional mobility: seated; 20 x 1 marching, calf raises, 10 x 1 L/R long arc quad ext with ankle pumps at end range.    neuromuscular re-education via manual cues, VCs, forced use for gait without AD, x 50' x 30'.  Gait is shuffling, with limited wt shifting > R, RLE externally rotated at hip. Sit>< stand with multi modal cues to promote forward wt shifting, = wt bearing, avoiding reaching to pull on RW or helper. Gait up/down 12 steps 2 rails, close superviison or min guard assist, step through method on 12 3" steps, and majority of 4 7" steps, and tactile cues to attend to R hand and progress it.  Therapeutic activity in standing: fine motor bil hands to unscrew lids from RX bottles and dump out beads; pt stood x 3 minutes without UE support. With R or L UE support in standing, pt reached out of BOS with each hand to grasp and release cones, to facilitate back extension, x 8 each.  PT returned pt to  room; applied quick release belt and left all needs within reach.  Instructed pt to ask for help back to bed after lunch to rest before 3PM therapy; also conveyed to RN.    Therapy Documentation Precautions:  Precautions Precautions: Fall, Other (comment) Precaution Comments: Aphasia Restrictions Weight Bearing Restrictions: No   Pain: Pain Assessment Pain Assessment: Faces Faces Pain Scale: 6/10, L low back    Locomotion : Ambulation Ambulation/Gait Assistance: 4: Min assist;3: Mod assist     See FIM for current functional status  Therapy/Group: Individual Therapy  Artavius Stearns 03/21/2015, 12:19 PM

## 2015-03-21 NOTE — Plan of Care (Signed)
Problem: RH BLADDER ELIMINATION Goal: RH STG MANAGE BLADDER WITH ASSISTANCE STG Manage Bladder With min Assistance  Outcome: Not Progressing Requiring I&O cathing

## 2015-03-22 ENCOUNTER — Inpatient Hospital Stay (HOSPITAL_COMMUNITY): Payer: Medicare Other | Admitting: Speech Pathology

## 2015-03-22 ENCOUNTER — Inpatient Hospital Stay (HOSPITAL_COMMUNITY): Payer: Medicare Other | Admitting: *Deleted

## 2015-03-22 ENCOUNTER — Inpatient Hospital Stay (HOSPITAL_COMMUNITY): Payer: Medicare Other | Admitting: Occupational Therapy

## 2015-03-22 DIAGNOSIS — I69359 Hemiplegia and hemiparesis following cerebral infarction affecting unspecified side: Secondary | ICD-10-CM

## 2015-03-22 DIAGNOSIS — I69328 Other speech and language deficits following cerebral infarction: Secondary | ICD-10-CM

## 2015-03-22 DIAGNOSIS — I69920 Aphasia following unspecified cerebrovascular disease: Secondary | ICD-10-CM

## 2015-03-22 DIAGNOSIS — I63512 Cerebral infarction due to unspecified occlusion or stenosis of left middle cerebral artery: Secondary | ICD-10-CM

## 2015-03-22 MED ORDER — TRAMADOL HCL 50 MG PO TABS
25.0000 mg | ORAL_TABLET | Freq: Two times a day (BID) | ORAL | Status: DC
  Administered 2015-03-22 – 2015-03-23 (×2): 25 mg via ORAL
  Filled 2015-03-22 (×2): qty 1

## 2015-03-22 MED ORDER — POLYVINYL ALCOHOL 1.4 % OP SOLN
1.0000 [drp] | Freq: Three times a day (TID) | OPHTHALMIC | Status: DC
  Administered 2015-03-22 – 2015-03-26 (×12): 1 [drp] via OPHTHALMIC
  Filled 2015-03-22: qty 15

## 2015-03-22 MED ORDER — METHOCARBAMOL 500 MG PO TABS
250.0000 mg | ORAL_TABLET | Freq: Three times a day (TID) | ORAL | Status: DC
  Administered 2015-03-22 – 2015-03-24 (×6): 250 mg via ORAL
  Filled 2015-03-22 (×6): qty 0.5
  Filled 2015-03-22: qty 1

## 2015-03-22 NOTE — Progress Notes (Signed)
Physical Therapy Session Note  Patient Details  Name: Linda [name] MRN: [id number] Date of Birth: [birth year]  Today's Date: 03/22/2015 PT Individual Time:   8:00-9:15 (11min)     Short Term Goals: Week 1:  PT Short Term Goal 1 (Week 1): Pt will perform bed mobility with Min A supine>sit logrolling PT Short Term Goal 2 (Week 1): Pt will perform basic squat-pivot transfers with Min A consistently PT Short Term Goal 3 (Week 1): Pt will perform gait x50' with RW and Min A PT Short Term Goal 4 (Week 1): Pt will perform 4 stairs with 1 rail and Min A PT Short Term Goal 5 (Week 1): Pt will tolerate static standing during functional task x3 min for increased activity tolerance.   Skilled Therapeutic Interventions/Progress Updates:  Tx focused on functional mobility training, gait with HHA and RW, stairs, and NMR via forced use, manual facilitation, and multi-modal cues. Pt up eating minimally, encouraged intake. Pt still limited by chronic back pain, 4/10 on faces scale. Requested again for K-pad and applied hot pack during therapy, monitoring skin for safety. Pt able to report comfort level with + pain reduction.   Pt performed mobility in room with HHA for gait 2x10 <>bathroom. Toileting and sit<>stands with Min/mod A depending on surface height. Pt able to stand at sink x5 min with close SBA for handwashing and teeth brushing with 1 UE support and postural cues as able.   Pt performed Nustep x10 min with bil UE/LE level 3>>5 with intermittent cues for full stride. Pt with less ext on R side, as well as less weight shifting to same side during gait.   Gait training with HHA up to Mod A and RW Min/S  x50' each with manual facilitation for weight shift, likely limited by scoliosis and back pain. Pt continues to demonstrate decreased foot clearance once cues removed.   Stair training x12 with bil rails and min steadying assist.   Static standing balance on wedge for prolonged stretch to limit  posterior lean while folding wash cloths for increased cognitive and balance challenge  TUG 32 sec with RW, indicating increased risk of falls.   Pt left up in [facility] with lap belt, all needs in reach.       Therapy Documentation Precautions:  Precautions Precautions: Fall, Other (comment) Precaution Comments: Aphasia Restrictions Weight Bearing Restrictions: No General:   Vital Signs: Therapy Vitals Temp: 98 F (36.7 C) Temp Source: Oral Pulse Rate: 85 Resp: (!) 24 (pt not in distress) BP: 111/70 mmHg Pain: 4/10 pain faces scale - applied hot pack to back and modified tx    See FIM for current functional status  Therapy/Group: Individual Therapy  Kennieth Rad, PT, DPT  03/22/2015, 8:31 AM

## 2015-03-22 NOTE — Progress Notes (Signed)
Occupational Therapy Session Note  Patient Details  Name: Linda [name] MRN: [id number] Date of Birth: 28-Apr-1929  Today's Date: 03/22/2015 OT Individual Time: 1030-1130 OT Individual Time Calculation (min): 60 min    Short Term Goals: Week 1:  OT Short Term Goal 1 (Week 1): Pt will complete toilet transfer with supervision with LRAD OT Short Term Goal 2 (Week 1): Pt will complete shower transfers with min assist with LRAD OT Short Term Goal 3 (Week 1): Pt will complete LB dressing with supervision OT Short Term Goal 4 (Week 1): Pt will complete 2 grooming tasks in standing with steady assist  Skilled Therapeutic Interventions/Progress Updates:    Pt seen for BADL retraining with a focus on activity tolerance, balance and cognitive processing. Pt received in bed. Pt said "no" when asked if she had pain. Pt seemed to be more anxious this session. She declined bathing today. Pt did need to toilet and wanted to use w/c for transfer. She transfers with min A and mod cues for safe foot and hand placement. Pt did urinate small amount.  Pt asked to pick out her clothing from drawers, she needed max cues to find selections as she kept picking up 3 shirts.  Mod cues at times to orient clothing to don. She does have [name], but does actively use R hand during self care. Once self care was completed pt expressed urgency to use bathroom. She stood suddenly and wanted to walk in. Pt sat for several minutes on toilet but was not able to go. Pt stood at sink to wash hands, but was wimpering. Tried several times to have pt express what was wrong.finally said "my back".  When asked if she has back pain, nodded yes and said she has had back pain for many years.  Nodded no when asked if she would like me to request pain med for her and nodded no when asked if she takes pain meds at home. Walked back to w/c with mod A and no AD. Pt adjusted in w/c, stating she no longer had pain. Pt with quick release belt on.   Call light in reach.   Therapy Documentation Precautions:  Precautions Precautions: Fall, Other (comment) Precaution Comments: Aphasia Restrictions Weight Bearing Restrictions: No     Pain: Pain Assessment Pain Assessment: No/denies pain ADL:  See FIM for current functional status  Therapy/Group: Individual Therapy  [place] 03/22/2015, 12:27 PM

## 2015-03-22 NOTE — Plan of Care (Signed)
Problem: RH BLADDER ELIMINATION Goal: RH STG MANAGE BLADDER WITH ASSISTANCE STG Manage Bladder With min Assistance  Outcome: Not Progressing Pt has been unable to void on her own since 7/15 except small volume with bowel movements.

## 2015-03-22 NOTE — Progress Notes (Signed)
Subjective/Complaints: Patient is aphasic and cannot give any complaints. She is smiling in Physical therapy Unable to perform review of systems secondary to a aphasia  Objective: Vital Signs: Blood pressure 111/70, pulse 85, temperature 98 F (36.7 C), temperature source Oral, resp. rate 24, weight 42.547 kg (93 lb 12.8 oz), last menstrual period 09/03/1996, SpO2 98 %. No results found. No results found for this or any previous visit (from the past 72 hour(s)).   HEENT: normal   Extremity:  No Edema Skin:   Intact Neuro: Abnormal Sensory cannot assess secondary to aphasia, Abnormal Motor 4-/5 on R side , 5-/5 on left, Aphasic and Apraxic Musc/Skel:  Normal Gen NAD   Assessment/Plan: 1. 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 Physiatrist is providing close team supervision and 24 hour management of active medical problems listed below. Physiatrist and rehab team continue to assess barriers to discharge/monitor patient progress toward functional and medical goals. FIM: FIM - Bathing Bathing Steps Patient Completed: Chest, Right Arm, Left Arm, Abdomen, Right upper leg, Left upper leg, Front perineal area, Buttocks, Right lower leg (including foot), Left lower leg (including foot) Bathing: 4: Steadying assist  FIM - Upper Body Dressing/Undressing Upper body dressing/undressing steps patient completed: Thread/unthread right bra strap, Thread/unthread left bra strap, Thread/unthread left sleeve of pullover shirt/dress, Put head through opening of pull over shirt/dress, Pull shirt over trunk Upper body dressing/undressing: 3: Mod-Patient completed 50-74% of tasks FIM - Lower Body Dressing/Undressing Lower body dressing/undressing steps patient completed: Thread/unthread right underwear leg, Thread/unthread left underwear  leg, Pull underwear up/down, Thread/unthread right pants leg, Thread/unthread left pants leg, Pull pants up/down, Don/Doff right sock, Don/Doff left sock, Don/Doff right shoe, Don/Doff left shoe Lower body dressing/undressing: 4: Min-Patient completed 75 plus % of tasks  FIM - Toileting Toileting steps completed by patient: Adjust clothing prior to toileting, Performs perineal hygiene, Adjust clothing after toileting Toileting Assistive Devices: Grab bar or rail for support Toileting: 4: Steadying assist  FIM - Radio producer Devices: Grab bars, Elevated toilet seat Toilet Transfers: 4-To toilet/BSC: Min A (steadying Pt. > 75%), 4-From toilet/BSC: Min A (steadying Pt. > 75%)  FIM - Bed/Chair Transfer Bed/Chair Transfer Assistive Devices: Copy: 5: Supine > Sit: Supervision (verbal cues/safety issues), 4: Bed > Chair or W/C: Min A (steadying Pt. > 75%)  FIM - Locomotion: Wheelchair Distance: 25 Locomotion: Wheelchair: 1: Total Assistance/staff pushes wheelchair (Pt<25%) FIM - Locomotion: Ambulation Locomotion: Ambulation Assistive Devices: Other (comment) (none) Ambulation/Gait Assistance: 4: Min assist, 3: Mod assist Locomotion: Ambulation: 2: Travels 50 - 149 ft with moderate assistance (Pt: 50 - 74%)  Comprehension Comprehension Mode: Auditory Comprehension: 3-Understands basic 50 - 74% of the time/requires cueing 25 - 50%  of the time  Expression Expression Mode: Verbal Expression: 2-Expresses basic 25 - 49% of the time/requires cueing 50 - 75% of the time. Uses single words/gestures.  Social Interaction Social Interaction: 3-Interacts appropriately 50 - 74% of the time - May be physically or verbally inappropriate.  Problem Solving Problem Solving: 2-Solves basic 25 - 49% of the time - needs direction more than half the time to initiate, plan or complete simple activities  Memory Memory: 2-Recognizes or recalls 25 - 49% of  the time/requires cueing 51 - 75% of the time  Medical Problem List and Plan: 1. Functional deficits secondary to Subacute left  PCA distribution infarct superimposed on previous left MCA infarct causing aphasia as well as balance deficits, reviewed therapy notes, currently at [place] level 2.  A fib/DVT Prophylaxis/Anticoagulation: Pharmaceutical: Other (comment)--Eliquis 3. Pain Management: Continue scheduled robaxin for chronic neck pain. Will also schedule tylenol and add Sportscreme to help with pain control. 4. Mood: LCSW to follow and assist patient and family with issues.   5. Neuropsych: This patient is not capable of making decisions on her own behalf. 6. Skin/Wound Care: Routine pressure relief measures. Elevated feet when in bed.   7. Fluids/Electrolytes/Nutrition: Monitor I/O. Offer supplements between meals.   8. A fib: Monitor heart rate bid. Continue lopressor bid.   9. UTI: Urine culture negative--. Will d/c antibiotics and monitor. Push po fluids.   10. Conjunctivitis: Warm moist compresses with Tobradex eye drops qid.   11. RLS: continue Requip tid.    LOS (Days) 5 A FACE TO FACE EVALUATION WAS PERFORMED  KIRSTEINS,ANDREW E 03/22/2015, 8:42 AM

## 2015-03-22 NOTE — Progress Notes (Signed)
Speech Language Pathology Daily Session Note  Patient Details  Name: Linda [name] MRN: [id number] Date of Birth: [birth year]  Today's Date: 03/22/2015 SLP Individual Time: 0930-1000; 1301-1400 SLP Individual Time Calculation (min): 30 min; 59 min   Short Term Goals: Week 1: SLP Short Term Goal 1 (Week 1): Pt will name basic, familiar objects with 75% accuracy during functional tasks with mod assist verbal and visual cues. SLP Short Term Goal 2 (Week 1): Pt will recognize and correct verbal errors during basic, structured tasks with mod-max assist verbal and visual cues over 75% of observable opportunities   SLP Short Term Goal 3 (Week 1): Pt will complete basic, familiar self care tasks with mod assist multimodal cues for functional problem solving over 75% of observable opportunities.  SLP Short Term Goal 4 (Week 1): Pt will initiate verbalizations and/or tasks with mod assist multimodal cues over 75% of observable opportunities.   Skilled Therapeutic Interventions:  Session 1: Pt was seen for skilled ST targeting cognitive-linguistic goals.  Upon arrival, pt was seated upright in wheelchair, awake, alert, and agreeable to participate in [place].  SLP facilitated the session with a structured sequencing task targeting sustained attention and thought organization.  Pt required max to total assist verbal and visual cues to complete the abovementioned task which SLP suspects to be related to pt's difficulty comprehending complex, multi-unit commands.  Therefore, task was terminated and SLP further facilitated the session with structured naming practice.  Pt was able to identify objects in pictures for ~75% accuracy with overall mod assist verbal and visual cues to monitor and correct verbal errors.  At the end of today's session, pt indicated that she wanted to return to bed from choice of two.  Pt was left in bed with call bell within reach and bed alarm activated.  Continue per current plan of care.     Session 2: Pt was seen for skilled ST targeting cognitive goals.  Upon arrival, pt was seated upright in wheelchair consuming lunch.  Upon completion of meal, SLP facilitated the session with a basic money management task targeting functional problem solving for home management.  Pt required overall max assist verbal and visual cues to sort coins into groups by value.  She was able to count coins with overall min-mod assist verbal cues for awareness and correction of errors/perseveration.  SLP also facilitated the session with a basic card game targeting initiation and further practice of functional problem solving.  Pt required overall mod assist verbal cues to initiate turn taking during the abovementioned task.  She was able to name cards with max assist verbal and visual cues.  Pt was left in wheelchair and handed off to RN in pt's room.  Continue per current plan of care.    FIM:  Comprehension Comprehension Mode: Auditory Comprehension: 3-Understands basic 50 - 74% of the time/requires cueing 25 - 50%  of the time Expression Expression Mode: Verbal Expression: 2-Expresses basic 25 - 49% of the time/requires cueing 50 - 75% of the time. Uses single words/gestures. Social Interaction Social Interaction: 3-Interacts appropriately 50 - 74% of the time - May be physically or verbally inappropriate. Problem Solving Problem Solving: 2-Solves basic 25 - 49% of the time - needs direction more than half the time to initiate, plan or complete simple activities Memory Memory: 2-Recognizes or recalls 25 - 49% of the time/requires cueing 51 - 75% of the time  Pain Pain Assessment (Session 1) Pain Assessment: No/denies pain Pain Assessment (Session 2)  Pain Assessment: No/denies pain  Therapy/Group: Individual Therapy  Faigy Stretch, Selinda Orion 03/22/2015, 12:29 PM

## 2015-03-23 ENCOUNTER — Inpatient Hospital Stay (HOSPITAL_COMMUNITY): Payer: Medicare Other | Admitting: Speech Pathology

## 2015-03-23 ENCOUNTER — Inpatient Hospital Stay (HOSPITAL_COMMUNITY): Payer: Medicare Other

## 2015-03-23 ENCOUNTER — Inpatient Hospital Stay (HOSPITAL_COMMUNITY): Payer: Medicare Other | Admitting: Occupational Therapy

## 2015-03-23 ENCOUNTER — Inpatient Hospital Stay (HOSPITAL_COMMUNITY): Payer: Medicare Other | Admitting: *Deleted

## 2015-03-23 LAB — BASIC METABOLIC PANEL
Anion gap: 7 (ref 5–15)
BUN: 24 mg/dL — ABNORMAL HIGH (ref 6–20)
CALCIUM: 8.7 mg/dL — AB (ref 8.9–10.3)
CO2: 24 mmol/L (ref 22–32)
CREATININE: 0.99 mg/dL (ref 0.44–1.00)
Chloride: 103 mmol/L (ref 101–111)
GFR calc Af Amer: 59 mL/min — ABNORMAL LOW (ref >60)
GFR calc non Af Amer: 51 mL/min — ABNORMAL LOW (ref >60)
Glucose, Bld: 133 mg/dL — ABNORMAL HIGH (ref 65–99)
Potassium: 3.9 mmol/L (ref 3.5–5.1)
SODIUM: 134 mmol/L — AB (ref 135–145)

## 2015-03-23 MED ORDER — TRAMADOL HCL 50 MG PO TABS
50.0000 mg | ORAL_TABLET | Freq: Two times a day (BID) | ORAL | Status: DC
  Administered 2015-03-23 – 2015-03-26 (×6): 50 mg via ORAL
  Filled 2015-03-23 (×6): qty 1

## 2015-03-23 MED ORDER — ENSURE ENLIVE PO LIQD
237.0000 mL | Freq: Two times a day (BID) | ORAL | Status: DC
  Administered 2015-03-23 – 2015-03-25 (×5): 237 mL via ORAL

## 2015-03-23 NOTE — Progress Notes (Signed)
Speech Language Pathology Daily Session Notes  Patient Details  Name: Linda [name] MRN: [id number] Date of Birth: 1929-08-16  Today's Date: 03/23/2015  Session 1: SLP Individual Time: 1030-1100 SLP Individual Time Calculation (min): 30 min   Session 2: SLP Individual Time: 7654-6503 SLP Individual Time Calculation (min): 60 min  Short Term Goals: Week 1: SLP Short Term Goal 1 (Week 1): Pt will name basic, familiar objects with 75% accuracy during functional tasks with mod assist verbal and visual cues. SLP Short Term Goal 2 (Week 1): Pt will recognize and correct verbal errors during basic, structured tasks with mod-max assist verbal and visual cues over 75% of observable opportunities   SLP Short Term Goal 3 (Week 1): Pt will complete basic, familiar self care tasks with mod assist multimodal cues for functional problem solving over 75% of observable opportunities.  SLP Short Term Goal 4 (Week 1): Pt will initiate verbalizations and/or tasks with mod assist multimodal cues over 75% of observable opportunities.   Skilled Therapeutic Interventions:  Session 1: Skilled treatment session focused on cognitive-linguistic goals. Upon arrival, patient was sitting upright in chair and appeared to be in discomfort. Patient utilized gestures to answer yes/no questions in regards to pain and location of pain. RN made aware and medications were administered.  SLP facilitated session by providing Max A phonemic and sentence completion cues for naming functional items and their functions at the word level.  Patient also required total A for reading comprehension at the phrase level. Patient left upright in wheelchair at RN station with quick release belt in place. Continue with current plan of care.    Session 2: Skilled treatment session focused on cognitive-linguistic goals. SLP facilitated an informal session in regards to the patient's interests. Patient was able to participate in conversation with  Max A multimodal cues and verbalized at the word level. Patient also named colors with 75% accuracy and Mod A visual and phonemic cues to self-correct phonemic paraphasias and identified colors from a field of 3 with 75% accuracy. Patient's daughter-in-law present and educated on patient's current cognitive-linguistic function. She verbalized understanding and asked appropriate questions. Patient left upright in bed with family present. Continue with current plan of care.    FIM:  Comprehension Comprehension Mode: Auditory Comprehension: 3-Understands basic 50 - 74% of the time/requires cueing 25 - 50%  of the time Expression Expression Mode: Verbal Expression: 2-Expresses basic 25 - 49% of the time/requires cueing 50 - 75% of the time. Uses single words/gestures. Social Interaction Social Interaction: 3-Interacts appropriately 50 - 74% of the time - May be physically or verbally inappropriate. Problem Solving Problem Solving: 2-Solves basic 25 - 49% of the time - needs direction more than half the time to initiate, plan or complete simple activities Memory Memory: 2-Recognizes or recalls 25 - 49% of the time/requires cueing 51 - 75% of the time  Pain Pain Assessment Pain Assessment: Faces Faces Pain Scale: Hurts whole lot Pain Type: Chronic pain Pain Location: Thoracic Pain Orientation: Right;Left;Mid Pain Descriptors / Indicators: Aching;Guarding Pain Onset: With Activity Pain Intervention(s): Medication (See eMAR)  Therapy/Group: Individual Therapy  Lorenso Quirino 03/23/2015, 12:06 PM

## 2015-03-23 NOTE — Progress Notes (Signed)
Social Work Patient ID: Linda [name], female   DOB: [birth year], 81 y.o.   MRN: [id number] Met with daughter in-law to discuss goals and discharge date along with options. She is convinced her Medicare will cover 24 hr care or respite care. Discussed Medicare will cover home care, but not custodial care, which is what pt would need for her safety at home. Family aware Medicare will cover NH care for up to 100 days,which pt has used 57 of them. Gave list of private duty agencies to daughter in-law and suggested she contact them to ask the questions she had and coverage Also.  It does not appear pt has a long term care policy, just her medicare A& B and supplemental AARP. They do not want NHP so will need to come up with a plan for discharge by Sat.

## 2015-03-23 NOTE — Progress Notes (Signed)
Social Work Patient ID: Linda [name], female   DOB: Jan 23, 1929, 80 y.o.   MRN: [id number] Spoke with Mark-pt';s son to discuss team conference goals-supervision/min level and discharge 7/23. All are aware she will need 24 hr care at discharge and want to pursue her long term care policy To pay for this. He is not aware if the policy has a waiting period, but many of them do. Will give list of private duty agencies and discuss contacting them to discuss cost and pt's policy. Pt was home alone Prior to admission which this worker finds hard to believe with her many deficits from her past strokes. Will work with on care plan and resume [place] following. Daughter in-law coming in early afternoon will meet with her And discuss plans.

## 2015-03-23 NOTE — Progress Notes (Signed)
Physical Therapy Session Note  Patient Details  Name: Linda [name] MRN: [id number] Date of Birth: [birth year]  Today's Date: 03/23/2015 PT Individual Time: 0810-0900 50 min  Short Term Goals: Week 1:  PT Short Term Goal 1 (Week 1): Pt will perform bed mobility with Min A supine>sit logrolling PT Short Term Goal 2 (Week 1): Pt will perform basic squat-pivot transfers with Min A consistently PT Short Term Goal 3 (Week 1): Pt will perform gait x50' with RW and Min A PT Short Term Goal 4 (Week 1): Pt will perform 4 stairs with 1 rail and Min A PT Short Term Goal 5 (Week 1): Pt will tolerate static standing during functional task x3 min for increased activity tolerance.   Skilled Therapeutic Interventions/Progress Updates:  Session focused on gait training, activity tolerance, and mobility. Pt received in bed eating breakfast. Pt reports increased back and neck pain. Pt performed bed mobility supine to sit with HOB raised requiring supervision. Pt required min assist for donning upper body dressing threading UE's through shirt and pulling over head while in sitting. Pt required min assist for lower body dressing in standing for balance. Pt requires min assist sit to stand with RW and min assist for turning and stand to sit with posterior LOB noted and no corrective response displayed. Pt ambulated with RW 90 feet X 2 with supervision demonstrating shuffling gait. Pt given max multimodal cuing to "take longer steps" with slight carryover. Pt performed Kinetron resistance 30 cm/sec 2 X 25 with counting out loud for endurance. Pt performed standing activity of folding towels X 3 min to challenge balance, activity tolerance, use of bilat UE's and LE strengthening. Pt returned to room sitting in wheelchair with quick release belt and all needs in reach.  Therapy Documentation Precautions:  Precautions Precautions: Fall, Other (comment) Precaution Comments: Aphasia Restrictions Weight Bearing  Restrictions: No  Pain: Pain Assessment Pain Assessment: Faces Faces Pain Scale: Hurts whole lot Pain Type: Chronic pain Pain Location: Thoracic Pain Orientation: Right;Left;Mid Pain Descriptors / Indicators: Aching;Guarding Pain Onset: With Activity  Pain intervention: Patient premedicated per RN  See FIM for current functional status  Therapy/Group: Individual Therapy  Elsie Ra 03/23/2015, 8:51 AM

## 2015-03-23 NOTE — Patient Care Conference (Signed)
Inpatient RehabilitationTeam Conference and Plan of Care Update Date: 03/23/2015   Time: 10;30 AM    Patient Name: Linda [name]      Medical Record Number: [id number]  Date of Birth: 10-17-1928 Sex: Female         Room/Bed: 4W04C/4W04C-01 Payor Info: Payor: MEDICARE / Plan: MEDICARE PART A AND B / Product Type: *No Product type* /    Admitting Diagnosis: l cva  Admit Date/Time:  03/17/2015  5:26 PM Admission Comments: No comment available   Primary Diagnosis:  <principal problem not specified> Principal Problem: <principal problem not specified>  Patient Active Problem List   Diagnosis Date Noted  . Apraxia complicating stroke 63/33/5456  . 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 Embolic stroke involving left posterior cerebral artery 03/17/2015  . Sequela, post-stroke 03/17/2015  . Encephalopathy 03/14/2015  . UTI (lower urinary tract infection) 03/14/2015  . Altered mental state 03/14/2015  . Chest pain 03/14/2015  . Back pain   . Constipation   . CVA (cerebral infarction) 02/18/2015  . Hemiparesis and speech and language deficit as late effects of stroke 02/09/2015  . History of upper gastrointestinal bleeding   . Long-term (current) use of anticoagulants   . Protein-calorie malnutrition, severe 12/27/2014  . Paroxysmal atrial fibrillation   . Left middle cerebral artery stroke 12/25/2014  . Essential hypertension 12/25/2014  . Dysphagia, pharyngoesophageal phase 12/25/2014  . Hypothyroidism 12/25/2014  . Restless legs syndrome with nocturnal myoclonus 05/13/2013    Expected Discharge Date: Expected Discharge Date: 03/26/15  Team Members Present: Physician leading conference: Dr. Alysia Penna Social Worker Present: Ovidio Kin, LCSW Nurse Present: Heather Roberts, RN PT Present: Jorge Mandril, PT;Caroline Lacinda Axon, PT;Other (comment) Rudene Christians Tygielski-PT) OT Present: Meriel Pica, Jules Schick, OT SLP Present:  Windell Moulding, SLP PPS Coordinator present : Daiva Nakayama, RN, CRRN     Current Status/Progress Goal Weekly Team Focus  Medical   poor initiation, anxious in OT not SLP, poor intake  reduce pain  Pain   Bowel/Bladder   pt continent of bowel and bladder. Very low output. measuring urine.   manage bowel and bladder min assist  contiune POC with b/b. assess urine output   Swallow/Nutrition/ Hydration     dys 3 thin liquids        ADL's   min A with balance during self care  supervision  balance, attention to R side, processing/problem solving, pt/family education   Mobility   S bed mobility, MIn A/Mod A transfers, Min/Mod A gait x50' Min A 12 stairs  S for all mobility includign gait x150'   balance, NMR, activity tolerance, safety, gait with LRAD   Communication   moderately severe global aphasia   min assist, basic   naming of objects, initiation of functional communication, yes/no response accuracy    Safety/Cognition/ Behavioral Observations  moderately severe cognitive deficits, decreased orientation, decreased sustained attention to tasks, decreased functional problem solving   min assist for basic   orientation, sustained attention, safety awareness, basic problem solving for familiar tasks    Pain   chronic pain to back. ultram 50mg  and robaxin 250mg  scheduled throughout day. PRN tylenol 650mg   4 or less  assess pain and medicate as needed. contiune use of scheduled medication   Skin   stage II x2 to mid back. foam inplace. sacral dressing to scarum for preventative measures            *See Care Plan and progress notes for  long and short-term goals.  Barriers to Discharge: severe cognitive deficits    Possible Resolutions to Barriers:  Will need 24/7 sup post D/C    Discharge Planning/Teaching Needs:  Family trying to come up wiht 24 hr care plan-was unaware care is private pay. Will work with son's on 24 hr care plan      Team Discussion:  Inconsistent with yes/no, basic  needs can express. Pain with back -scheduled ultram. Processing issues from past CVA's. Moving well requires supervision/steady assist. Cont B & B. Recommendation of 24 hr care for safety and communication issues. Family will need to arrange  Revisions to Treatment Plan:  Downgrade goals to supervision/min   Continued Need for Acute Rehabilitation Level of Care: The patient requires daily medical management by a physician with specialized training in physical medicine and rehabilitation for the following conditions: Daily direction of a multidisciplinary physical rehabilitation program to ensure safe treatment while eliciting the highest outcome that is of practical value to the patient.: Yes Daily medical management of patient stability for increased activity during participation in an intensive rehabilitation regime.: Yes Daily analysis of laboratory values and/or radiology reports with any subsequent need for medication adjustment of medical intervention for : Neurological problems;Other  Nehemyah Foushee, Gardiner Rhyme 03/23/2015, 12:51 PM

## 2015-03-23 NOTE — Progress Notes (Signed)
Occupational Therapy Session Note  Patient Details  Name: Linda [name] MRN: [id number] Date of Birth: 11/02/1928  Today's Date: 03/23/2015 OT Individual Time: 1531-1601 OT Individual Time Calculation (min): 30 min    Short Term Goals: Week 1:  OT Short Term Goal 1 (Week 1): Pt will complete toilet transfer with supervision with LRAD OT Short Term Goal 2 (Week 1): Pt will complete shower transfers with min assist with LRAD OT Short Term Goal 3 (Week 1): Pt will complete LB dressing with supervision OT Short Term Goal 4 (Week 1): Pt will complete 2 grooming tasks in standing with steady assist  Skilled Therapeutic Interventions/Progress Updates:  Upon entering the room, pt supine in bed with head elevated and daughter in law present in the room. Pt with no c/o, signs, or symptoms of pain this session. Caregiver requesting education regarding upcoming discharge recommendations and expectations. Pt verbalizing "yes" that she would like to know expectations. Pt shaking head "yes" as well when asked if she were nervous or anxious about discharge. OT educating pt on [place] setting, evaluation, appointment set up, and home safety with pt and caregiver verbalizing understanding. Energy conservation education also done regarding self care tasks, general principles, and community activities such as doctor's appointments. Caregiver making notes of conversation and asking questions as appropriate.  Education to continue. Pt remained in bed with bed alarm activated, call bell within reach, and family member remaining present as therapist exited the room.   Therapy Documentation Precautions:  Precautions Precautions: Fall, Other (comment) Precaution Comments: Aphasia Restrictions Weight Bearing Restrictions: No General:   Vital Signs: Therapy Vitals Temp: 97.9 F (36.6 C) Temp Source: Oral Pulse Rate: 89 Resp: 18 BP: 102/73 mmHg Patient Position (if appropriate): Sitting Oxygen Therapy SpO2:  100 % O2 Device: Not Delivered  See FIM for current functional status  Therapy/Group: Individual Therapy  Phineas Semen 03/23/2015, 4:23 PM

## 2015-03-23 NOTE — Progress Notes (Signed)
Social Work Linda [name], [place] Social Worker Signed  Patient Care Conference 03/23/2015 12:51 PM    Expand All Collapse All   Inpatient RehabilitationTeam Conference and Plan of Care Update Date: 03/23/2015   Time: 10;30 AM     Patient Name: Linda [name]       Medical Record Number: [id number]  Date of Birth: 1928-10-13 Sex: Female         Room/Bed: 4W04C/4W04C-01 Payor Info: Payor: MEDICARE / Plan: MEDICARE PART A AND B / Product Type: *No Product type* /    Admitting Diagnosis: l cva   Admit Date/Time:  03/17/2015  5:26 PM Admission Comments: No comment available   Primary Diagnosis:  <principal problem not specified> Principal Problem: <principal problem not specified>    Patient Active Problem List     Diagnosis  Date Noted   .  Apraxia complicating stroke  70/26/3785   .  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  Embolic stroke involving left posterior cerebral artery  03/17/2015   .  Sequela, post-stroke  03/17/2015   .  Encephalopathy  03/14/2015   .  UTI (lower urinary tract infection)  03/14/2015   .  Altered mental state  03/14/2015   .  Chest pain  03/14/2015   .  Back pain     .  Constipation     .  CVA (cerebral infarction)  02/18/2015   .  Hemiparesis and speech and language deficit as late effects of stroke  02/09/2015   .  History of upper gastrointestinal bleeding     .  Long-term (current) use of anticoagulants     .  Protein-calorie malnutrition, severe  12/27/2014   .  Paroxysmal atrial fibrillation     .  Left middle cerebral artery stroke  12/25/2014   .  Essential hypertension  12/25/2014   .  Dysphagia, pharyngoesophageal phase  12/25/2014   .  Hypothyroidism  12/25/2014   .  Restless legs syndrome with nocturnal myoclonus  05/13/2013     Expected Discharge Date: Expected Discharge Date: 03/26/15  Team Members Present: Physician leading conference: Dr. Alysia Penna Social Worker Present:  Ovidio Kin, LCSW Nurse Present: Heather Roberts, RN PT Present: Jorge Mandril, PT;Caroline Lacinda Axon, PT;Other (comment) Rudene Christians Tygielski-PT) OT Present: Meriel Pica, Jules Schick, OT SLP Present: Windell Moulding, SLP PPS Coordinator present : Daiva Nakayama, RN, CRRN        Current Status/Progress  Goal  Weekly Team Focus   Medical     poor initiation, anxious in OT not SLP, poor intake   reduce pain  Pain   Bowel/Bladder     pt continent of bowel and bladder. Very low output. measuring urine.   manage bowel and bladder min assist   contiune POC with b/b. assess urine output    Swallow/Nutrition/ Hydration       dys 3 thin liquids         ADL's     min A with balance during self care   supervision  balance, attention to R side, processing/problem solving, pt/family education   Mobility     S bed mobility, MIn A/Mod A transfers, Min/Mod A gait x50' Min A 12 stairs  S for all mobility includign gait x150'   balance, NMR, activity tolerance, safety, gait with LRAD   Communication     moderately severe global aphasia   min assist, basic   naming of objects, initiation of functional  communication, yes/no response accuracy    Safety/Cognition/ Behavioral Observations    moderately severe cognitive deficits, decreased orientation, decreased sustained attention to tasks, decreased functional problem solving   min assist for basic   orientation, sustained attention, safety awareness, basic problem solving for familiar tasks    Pain     chronic pain to back. ultram 50mg  and robaxin 250mg  scheduled throughout day. PRN tylenol 650mg   4 or less  assess pain and medicate as needed. contiune use of scheduled medication   Skin     stage II x2 to mid back. foam inplace. sacral dressing to scarum for preventative measures            *See Care Plan and progress notes for long and short-term goals.    Barriers to Discharge:  severe cognitive deficits     Possible Resolutions to Barriers:   Will need  24/7 sup post D/C     Discharge Planning/Teaching Needs:   Family trying to come up wiht 24 hr care plan-was unaware care is private pay. Will work with son's on 24 hr care plan       Team Discussion:    Inconsistent with yes/no, basic needs can express. Pain with back -scheduled ultram. Processing issues from past CVA's. Moving well requires supervision/steady assist. Cont B & B. Recommendation of 24 hr care for safety and communication issues. Family will need to arrange   Revisions to Treatment Plan:    Downgrade goals to supervision/min    Continued Need for Acute Rehabilitation Level of Care: The patient requires daily medical management by a physician with specialized training in physical medicine and rehabilitation for the following conditions: Daily direction of a multidisciplinary physical rehabilitation program to ensure safe treatment while eliciting the highest outcome that is of practical value to the patient.: Yes Daily medical management of patient stability for increased activity during participation in an intensive rehabilitation regime.: Yes Daily analysis of laboratory values and/or radiology reports with any subsequent need for medication adjustment of medical intervention for : Neurological problems;Other  Linda [name] 03/23/2015, 12:51 PM                  Patient ID: Linda [name], female   DOB: [birth year], 81 y.o.   MRN: [id number]

## 2015-03-23 NOTE — Progress Notes (Signed)
Recreational Therapy Session Note  Patient Details  Name: Linda [name] MRN: [id number] Date of Birth: 05/29/1929 Today's Date: 03/23/2015  Pain: no c/o pain and responded no when asked by shaking her head no Skilled Therapeutic Interventions/Progress Updates: Reviewed leisure screen information that family completed and compared answers to pt responses with 100% accuracy with Y/N questions.  Discharge set for 7/23, therefore no further TR implemented.  Leisure interests filed in chart for team review. [place] 03/23/2015, 3:24 PM

## 2015-03-23 NOTE — Progress Notes (Signed)
Subjective/Complaints: Discussed poor intake with nursing. Also poor output. Patient will drink and sure.  Unable to perform review of systems secondary to a aphasia  Objective: Vital Signs: Blood pressure 131/78, pulse 86, temperature 97.9 F (36.6 C), temperature source Oral, resp. rate 27, weight 42.547 kg (93 lb 12.8 oz), last menstrual period 09/03/1996, SpO2 100 %. No results found. No results found for this or any previous visit (from the past 72 hour(s)).   HEENT: normal   Extremity:  No Edema Skin:   Intact Neuro: Abnormal Sensory cannot assess secondary to aphasia, Abnormal Motor 4-/5 on R side , 5-/5 on left, Aphasic and Apraxic Musc/Skel:  Normal Gen NAD Lungs clear to auscultation Heart irregularly irregular, no murmurs  Assessment/Plan: 1. 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 Physiatrist is providing close team supervision and 24 hour management of active medical problems listed below. Physiatrist and rehab team continue to assess barriers to discharge/monitor patient progress toward functional and medical goals. Team conference today please see physician documentation under team conference tab, met with team face-to-face to discuss problems,progress, and goals. Formulized individual treatment plan based on medical history, underlying problem and comorbidities. FIM: FIM - Bathing Bathing Steps Patient Completed: Chest, Right Arm, Left Arm, Abdomen, Right upper leg, Left upper leg, Front perineal area, Buttocks, Right lower leg (including foot), Left lower leg (including foot) Bathing: 4: Steadying assist  FIM - Upper Body Dressing/Undressing Upper body dressing/undressing steps patient completed: Thread/unthread right sleeve of pullover shirt/dresss, Thread/unthread left sleeve of pullover shirt/dress,  Put head through opening of pull over shirt/dress, Pull shirt over trunk Upper body dressing/undressing: 5: Supervision: Safety issues/verbal cues FIM - Lower Body Dressing/Undressing Lower body dressing/undressing steps patient completed: Thread/unthread right underwear leg, Thread/unthread left underwear leg, Pull underwear up/down, Thread/unthread right pants leg, Thread/unthread left pants leg, Pull pants up/down, Don/Doff right sock, Don/Doff left sock, Don/Doff right shoe, Don/Doff left shoe Lower body dressing/undressing: 4: Min-Patient completed 75 plus % of tasks  FIM - Toileting Toileting steps completed by patient: Adjust clothing prior to toileting, Performs perineal hygiene, Adjust clothing after toileting Toileting Assistive Devices: Grab bar or rail for support Toileting: 4: Steadying assist  FIM - Radio producer Devices: Grab bars, Elevated toilet seat Toilet Transfers: 4-To toilet/BSC: Min A (steadying Pt. > 75%), 4-From toilet/BSC: Min A (steadying Pt. > 75%)  FIM - Bed/Chair Transfer Bed/Chair Transfer Assistive Devices: Walker, Arm rests Bed/Chair Transfer: 4: Sit > Supine: Min A (steadying pt. > 75%/lift 1 leg), 4: Bed > Chair or W/C: Min A (steadying Pt. > 75%)  FIM - Locomotion: Wheelchair Distance: 25 Locomotion: Wheelchair: 1: Total Assistance/staff pushes wheelchair (Pt<25%) FIM - Locomotion: Ambulation Locomotion: Ambulation Assistive Devices: Administrator, Other (comment) (HHA) Ambulation/Gait Assistance: 4: Min assist, 3: Mod assist Locomotion: Ambulation: 2: Travels 50 - 149 ft with moderate assistance (Pt: 50 - 74%)  Comprehension Comprehension Mode: Auditory Comprehension: 3-Understands basic 50 - 74% of the time/requires cueing 25 - 50%  of the time  Expression Expression Mode: Verbal Expression: 2-Expresses basic 25 - 49% of the time/requires cueing 50 - 75% of the time. Uses single words/gestures.  Social  Interaction Social Interaction: 3-Interacts appropriately 50 - 74% of the time - May be physically or verbally inappropriate.  Problem Solving Problem Solving: 2-Solves basic 25 - 49% of the time -  needs direction more than half the time to initiate, plan or complete simple activities  Memory Memory: 2-Recognizes or recalls 25 - 49% of the time/requires cueing 51 - 75% of the time  Medical Problem List and Plan: 1. Functional deficits secondary to Subacute left PCA distribution infarct superimposed on previous left MCA infarct causing aphasia as well as balance deficits, reviewed therapy notes,meeting with the therapist today 2.  A fib/DVT Prophylaxis/Anticoagulation: Pharmaceutical: Other (comment)--Eliquis, no sign of rapid ventricular response 3. Pain Management: Continue scheduled robaxin for chronic neck pain. Will also schedule tylenol and add Sportscreme to help with pain control. 4. Mood: LCSW to follow and assist patient and family with issues.   5. Neuropsych: This patient is not capable of making decisions on her own behalf. 6. Skin/Wound Care: Routine pressure relief measures. Elevated feet when in bed.   7. Fluids/Electrolytes/Nutrition: Monitor I/O. Offer supplements between meals.  oral intake around 500 mL yesterday, meals were 0-25% 8. A fib: Monitor heart rate bid. Continue lopressor bid.   9. UTI: Urine culture negative--. Will d/c antibiotics and monitor. Push po fluids.   10. Conjunctivitis: Warm moist compresses with Tobradex eye drops qid.   11. RLS: continue Requip tid.    LOS (Days) 6 A FACE TO FACE EVALUATION WAS PERFORMED  KIRSTEINS,ANDREW E 03/23/2015, 9:33 AM

## 2015-03-23 NOTE — Progress Notes (Signed)
Occupational Therapy Session Note  Patient Details  Name: Linda [name] MRN: [id number] Date of Birth: Jun 14, 1929  Today's Date: 03/23/2015 OT Individual Time: 9675-9163 OT Individual Time Calculation: 35 min    Short Term Goals: Week 1:  OT Short Term Goal 1 (Week 1): Pt will complete toilet transfer with supervision with LRAD OT Short Term Goal 2 (Week 1): Pt will complete shower transfers with min assist with LRAD OT Short Term Goal 3 (Week 1): Pt will complete LB dressing with supervision OT Short Term Goal 4 (Week 1): Pt will complete 2 grooming tasks in standing with steady assist  Skilled Therapeutic Interventions/Progress Updates:    Pt seen for 1:1 OT session with a focus on ADL retraining, functional mobility, dynamic balance, cognitive remediation, and activity tolerance. Pt received seated in w/c expressing anxiety with vocal noises. When therapist asked what was wrong, pt pointing to "Ensure" drink, and therapist removed. Pt agreeable to complete shower this session and completed initial sit>stand with min A and mod cues for hand placement. Pt ambulated via HHA to pick out clothing and required max cues for full outfit selection. Pt requesting to toilet and completed ambulation to bathroom via HHA with min A. Pt completed toileting and was able to have BM. During mobility pt expressing back pain. Pt transferred to shower with min A and completed showering with steadying A during transitional movements for peri/buttocks hygiene. Pt required mod sequencing cues during bathing. Pt then completed dressing sit<>stand from w/c level with steadying A. Pt required mod cues with dressing, with pt attempting to don underwear on UB. Pt completed grooming in standing at sink with steadying A. Pt required mod cues for appropriate usage of tooth brush, with pt attempting to use opposite end.  Pt left seated in w/c with SLP present.   Therapy Documentation Precautions:  Precautions Precautions:  Fall, Other (comment) Precaution Comments: Aphasia Restrictions Weight Bearing Restrictions: No General:   Vital Signs: Therapy Vitals Pulse Rate: 86 BP: 131/78 mmHg Pain: Pain Assessment Pain Assessment: Faces Faces Pain Scale: Hurts whole lot Pain Type: Chronic pain Pain Location: Thoracic Pain Orientation: Right;Left;Mid Pain Descriptors / Indicators: Aching;Guarding Pain Onset: With Activity ADL:   Exercises:   Other Treatments:    See FIM for current functional status  Therapy/Group: Individual Therapy  Dorann Ou 03/23/2015, 10:47 AM

## 2015-03-24 ENCOUNTER — Inpatient Hospital Stay (HOSPITAL_COMMUNITY): Payer: Medicare Other | Admitting: Occupational Therapy

## 2015-03-24 ENCOUNTER — Inpatient Hospital Stay (HOSPITAL_COMMUNITY): Payer: Medicare Other | Admitting: Speech Pathology

## 2015-03-24 ENCOUNTER — Inpatient Hospital Stay (HOSPITAL_COMMUNITY): Payer: Medicare Other

## 2015-03-24 MED ORDER — METHOCARBAMOL 500 MG PO TABS
500.0000 mg | ORAL_TABLET | Freq: Three times a day (TID) | ORAL | Status: DC
  Administered 2015-03-24 – 2015-03-26 (×6): 500 mg via ORAL
  Filled 2015-03-24 (×9): qty 1

## 2015-03-24 MED ORDER — SODIUM CHLORIDE 0.45 % IV SOLN
INTRAVENOUS | Status: DC
  Administered 2015-03-24 – 2015-03-25 (×2): via INTRAVENOUS

## 2015-03-24 NOTE — Progress Notes (Signed)
Social Work Patient ID: Linda [name], female   DOB: 06-13-1929, 81 y.o.   MRN: [id number] Spoke with Lauro Regulus to discuss plan for discharge, he feels it is home, with them rotating to assist. Asked him to come in and attend therapies with her prior to discharge. He will let me know his schedule tomorrow, but he has a training he is not sure yet of when. He will contact me tomorrow am to let me know the plan. Informed it would be Helpful to have come in and attend PT session.  He reports he was holding into her when she walked prior to admission-shoe would lean backwards and sideways, prior to admission. They are aware she will need 24 hr care. He states: " I guess Legrand Como will be ok with this." This worker will contact Legrand Como and encouraged mark to talk with him also. Will plan for discharge home on Sat.

## 2015-03-24 NOTE — Progress Notes (Signed)
Speech Language Pathology Daily Session Note  Patient Details  Name: Linda [name] MRN: [id number] Date of Birth: Feb 25, 1929  Today's Date: 03/24/2015 SLP Individual Time: 1400-1500 SLP Individual Time Calculation (min): 60 min  Short Term Goals: Week 1: SLP Short Term Goal 1 (Week 1): Pt will name basic, familiar objects with 75% accuracy during functional tasks with mod assist verbal and visual cues. SLP Short Term Goal 2 (Week 1): Pt will recognize and correct verbal errors during basic, structured tasks with mod-max assist verbal and visual cues over 75% of observable opportunities   SLP Short Term Goal 3 (Week 1): Pt will complete basic, familiar self care tasks with mod assist multimodal cues for functional problem solving over 75% of observable opportunities.  SLP Short Term Goal 4 (Week 1): Pt will initiate verbalizations and/or tasks with mod assist multimodal cues over 75% of observable opportunities.   Skilled Therapeutic Interventions:  Pt was seen for skilled ST targeting cognitive goals. Upon arrival, pt was seated upright in wheelchair, restlessly calling out "say, so" and pulling at her pants.  Pt indicated that she needed to use the bathroom via yes/no responses to SLP's questions.  SLP assisted pt to bathroom.  Pt followed 1 and 2 step commands within a functional context while transferring and toileting with min assist verbal cues.  SLP then facilitated the session with a basic sorting task targeting sustained attention and functional problem solving.  Pt required overall mod-max assist verbal, visual, and tactile cues to sort blocks into three groups based on color.  Pt also attended to a functional home management task (watering plants) in a quiet environment with min verbal cues for initiation.  Pt was returned to room where she indicated that she wanted to return to bed via max assist verbal cues and choice of two.  Pt left in bed with all needs within reach and bed alarm  activated.  Continue per current plan of care.     FIM:  Comprehension Comprehension Mode: Auditory Comprehension: 4-Understands basic 75 - 89% of the time/requires cueing 10 - 24% of the time Expression Expression Mode: Verbal Expression: 2-Expresses basic 25 - 49% of the time/requires cueing 50 - 75% of the time. Uses single words/gestures. Social Interaction Social Interaction: 2-Interacts appropriately 25 - 49% of time - Needs frequent redirection. Problem Solving Problem Solving: 2-Solves basic 25 - 49% of the time - needs direction more than half the time to initiate, plan or complete simple activities Memory Memory: 2-Recognizes or recalls 25 - 49% of the time/requires cueing 51 - 75% of the time  Pain Pain Assessment Pain Assessment: No/denies pain  Therapy/Group: Individual Therapy  Torien Ramroop, Selinda Orion 03/24/2015, 4:07 PM

## 2015-03-24 NOTE — Consult Note (Signed)
   [facility] CM Inpatient Consult   03/24/2015  KEALEY KEMMER 10-27-1928 [id number] Patient evaluated for community based chronic disease management services with [facility] as a benefit of patient's Medicare Insurance and primary care provider. Spoke with inpatient Rehabilitation social worker regarding potential post hospital follow up needs.  Patient's current discharge is planned for this Saturday back with her family.  Jacqlyn Larsen states it is best to speak with her son, Elta Guadeloupe @ [phone].  Will follow up on referral for post rehab needs.  Of note,  [facility] Care Management does not replace or interfere with any services arranged for discharge of this patient.  For questions, please contact: Natividad Brood, RN BSN [facility] Liaison  [phone] business mobile phone

## 2015-03-24 NOTE — Progress Notes (Signed)
Physical Therapy Session Note  Patient Details  Name: YOUSRA IVENS MRN: [id number] Date of Birth: 01/03/1929  Today's Date: 03/24/2015 PT Individual Time: 0900-1000 PT Individual Time Calculation (min): 60 min   Short Term Goals: Week 1:  PT Short Term Goal 1 (Week 1): Pt will perform bed mobility with Min A supine>sit logrolling PT Short Term Goal 2 (Week 1): Pt will perform basic squat-pivot transfers with Min A consistently PT Short Term Goal 3 (Week 1): Pt will perform gait x50' with RW and Min A PT Short Term Goal 4 (Week 1): Pt will perform 4 stairs with 1 rail and Min A PT Short Term Goal 5 (Week 1): Pt will tolerate static standing during functional task x3 min for increased activity tolerance.   Skilled Therapeutic Interventions/Progress Updates:     Pt communicated she needed to use toilet. Bed mobility with extra time, HOB raised. Bed> stand with RW with min guard assist, multi modal cues not to pull up on RW. Gait in room to toilet and sink with RW, min guard assist, cues for longer step lengths. Toilet transfer with min assist as pt had LOB backwards when backing up to sit. Pt incontinent of loose stool in diaper, continued in toilet. See FIM. Stand> w/c with LOB backwards, requiring mod assist.  In gym, gait x 150' x 15' x 15' with RW, min guard assist, including turns to R and L. Up/down 12 steps (4) 7" and (8) 3" high steps, with bil rails, step through to ascend, step to to descend, leading with R without cues, with close supervision. Pt had delayed progression of R hand when descending, requiring 2 tactile cues to progress. During stand> sit in w/c, pt had LOB backwards despite cues for technique, requiring mod assist for safety.  PT returned pt to room; all needs left in place.  PT notified NT that pt may have been incontinent of stool again. Therapy Documentation Precautions:  Precautions Precautions: Fall, Other (comment) Precaution Comments:  Aphasia Restrictions Weight Bearing Restrictions: No Pain: Pain Assessment Pain Assessment: No/denies pain Pain Score:"hurts a lot" ; premedicated     See FIM for current functional status  Therapy/Group: Individual Therapy  Rocky Gladden 03/24/2015, 4:20 PM

## 2015-03-24 NOTE — Progress Notes (Signed)
Subjective/Complaints: Discussed poor intake with nursing. Will need IVF ROS- cannot obtain due to aphasia Objective: Vital Signs: Blood pressure 138/74, pulse 72, temperature 98.5 F (36.9 C), temperature source Oral, resp. rate 18, weight 44.135 kg (97 lb 4.8 oz), last menstrual period 09/03/1996, SpO2 100 %. No results found. Results for orders placed or performed during the hospital encounter of 03/17/15 (from the past 72 hour(s))  Basic metabolic panel     Status: Abnormal   Collection Time: 03/23/15  2:16 PM  Result Value Ref Range   Sodium 134 (L) 135 - 145 mmol/L   Potassium 3.9 3.5 - 5.1 mmol/L   Chloride 103 101 - 111 mmol/L   CO2 24 22 - 32 mmol/L   Glucose, Bld 133 (H) 65 - 99 mg/dL   BUN 24 (H) 6 - 20 mg/dL   Creatinine, Ser 0.99 0.44 - 1.00 mg/dL   Calcium 8.7 (L) 8.9 - 10.3 mg/dL   GFR calc non Af Amer 51 (L) >60 mL/min   GFR calc Af Amer 59 (L) >60 mL/min    Comment: (NOTE) The eGFR has been calculated using the CKD EPI equation. This calculation has not been validated in all clinical situations. eGFR's persistently <60 mL/min signify possible Chronic Kidney Disease.    Anion gap 7 5 - 15     HEENT: normal Eyes, without redness or discharge  Extremity:  No Edema Skin:   Intact Neuro: Abnormal Sensory cannot assess secondary to aphasia, Abnormal Motor 4-/5 on R side , 5-/5 on left, Aphasic and Apraxic Musc/Skel:  Normal Gen NAD Lungs clear to auscultation Heart irregularly irregular, no murmurs  Assessment/Plan: 1. 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 Physiatrist is providing close team supervision and 24 hour management of active medical problems listed below. Physiatrist and rehab team continue to assess barriers to discharge/monitor patient progress toward functional and medical  goals.  FIM: FIM - Bathing Bathing Steps Patient Completed: Chest, Right Arm, Left Arm, Abdomen, Right upper leg, Left upper leg, Front perineal area, Buttocks, Right lower leg (including foot), Left lower leg (including foot) Bathing: 4: Steadying assist  FIM - Upper Body Dressing/Undressing Upper body dressing/undressing steps patient completed: Thread/unthread right sleeve of pullover shirt/dresss, Thread/unthread left sleeve of pullover shirt/dress, Put head through opening of pull over shirt/dress, Pull shirt over trunk Upper body dressing/undressing: 5: Supervision: Safety issues/verbal cues FIM - Lower Body Dressing/Undressing Lower body dressing/undressing steps patient completed: Thread/unthread right underwear leg, Thread/unthread left underwear leg, Pull underwear up/down, Thread/unthread right pants leg, Thread/unthread left pants leg, Pull pants up/down, Don/Doff right shoe, Don/Doff left shoe Lower body dressing/undressing: 4: Min-Patient completed 75 plus % of tasks  FIM - Toileting Toileting steps completed by patient: Adjust clothing prior to toileting, Performs perineal hygiene, Adjust clothing after toileting Toileting Assistive Devices: Grab bar or rail for support Toileting: 4: Steadying assist  FIM - Radio producer Devices: Grab bars, Elevated toilet seat Toilet Transfers: 4-To toilet/BSC: Min A (steadying Pt. > 75%), 4-From toilet/BSC: Min A (steadying Pt. > 75%)  FIM - Bed/Chair Transfer Bed/Chair Transfer Assistive Devices: HOB elevated, Arm rests, Copy: 5: Supine > Sit: Supervision (verbal cues/safety issues), 4: Bed > Chair or W/C: Min A (steadying Pt. > 75%), 4: Chair or W/C > Bed: Min A (steadying Pt. > 75%)  FIM - Locomotion: Wheelchair  Distance: 25 Locomotion: Wheelchair: 1: Total Assistance/staff pushes wheelchair (Pt<25%) FIM - Locomotion: Ambulation Locomotion: Ambulation Assistive Devices: Walker -  Rolling, Other (comment) (90 ft) Ambulation/Gait Assistance: 5: Supervision Locomotion: Ambulation: 2: Travels 50 - 149 ft with supervision/safety issues  Comprehension Comprehension Mode: Auditory Comprehension: 4-Understands basic 75 - 89% of the time/requires cueing 10 - 24% of the time  Expression Expression Mode: Verbal Expression: 2-Expresses basic 25 - 49% of the time/requires cueing 50 - 75% of the time. Uses single words/gestures.  Social Interaction Social Interaction: 2-Interacts appropriately 25 - 49% of time - Needs frequent redirection.  Problem Solving Problem Solving: 2-Solves basic 25 - 49% of the time - needs direction more than half the time to initiate, plan or complete simple activities  Memory Memory: 2-Recognizes or recalls 25 - 49% of the time/requires cueing 51 - 75% of the time  Medical Problem List and Plan: 1. Functional deficits secondary to Subacute left PCA distribution infarct superimposed on previous left MCA infarct causing aphasia as well as balance deficits, reviewed therapy notes,meeting with the therapist today 2.  A fib/DVT Prophylaxis/Anticoagulation: Pharmaceutical: Other (comment)--Eliquis, HR controlled 3. Pain Management: Continue scheduled robaxin for chronic neck pain. Will also schedule tylenol and add Sportscreme to help with pain control. 4. Mood: LCSW to follow and assist patient and family with issues.   5. Neuropsych: This patient is not capable of making decisions on her own behalf. 6. Skin/Wound Care: Routine pressure relief measures. Elevated feet when in bed.   7. Fluids/Electrolytes/Nutrition: Monitor I/O.IVF tonite Offer supplements between meals.  oral intake around 240 mL yesterday, meals were 10-25% 8. A fib: Monitor heart rate bid. Continue lopressor bid.   9.  Recent UTI, s/p abx high risk pt will reculture 10. Conjunctivitis: resolved 11. RLS: continue Requip tid.    LOS (Days) 7 A FACE TO FACE EVALUATION WAS  PERFORMED  Dim Meisinger E 03/24/2015, 7:52 AM

## 2015-03-24 NOTE — Progress Notes (Signed)
Occupational Therapy Session Note  Patient Details  Name: Linda [name] MRN: [id number] Date of Birth: Feb 13, 1929  Today's Date: 03/24/2015 OT Individual Time: 1030-1130 OT Individual Time Calculation (min): 60 min    Short Term Goals: Week 1:  OT Short Term Goal 1 (Week 1): Pt will complete toilet transfer with supervision with LRAD OT Short Term Goal 1 - Progress (Week 1): Not progressing OT Short Term Goal 2 (Week 1): Pt will complete shower transfers with min assist with LRAD OT Short Term Goal 2 - Progress (Week 1): Met OT Short Term Goal 3 (Week 1): Pt will complete LB dressing with supervision OT Short Term Goal 3 - Progress (Week 1): Not progressing OT Short Term Goal 4 (Week 1): Pt will complete 2 grooming tasks in standing with steady assist OT Short Term Goal 4 - Progress (Week 1): Met  Pt had LTGs of supervision with all self care and mobility. Due to her posterior lean, decreased balance,  [name] and poor activity tolerance, all goals changed from S to steady A.  Pt will need 24/7 care.  Skilled Therapeutic Interventions/Progress Updates:    Pt seen this session to address balance, activity tolerance, use of RUE and cognitive processing. Pt declined bathing, but did work on grooming in standing, toileting and dressing. Pt continues to need assist with clothing selection, verbal cues for clothing orientation to don clothes.  She continues to need steadying A with all mobility and standing. It will not be safe for pt to work on self care at S level as she is such a high fall risk.  LTGs changed.  Pt then worked on ambulation in hallway with RW with steady A and min - mod A with turns.  [name], pt worked on BUE shoulder AROM with hands clasped. Pt needed max cues and A to figure out how to clasp her hands. Once she started on exercises, she was able to continue with min cues. Pt resting in w/c with kpad on back and quick release belt on . Call light in reach.  Therapy  Documentation Precautions:  Precautions Precautions: Fall, Other (comment) Precaution Comments: Aphasia Restrictions Weight Bearing Restrictions: No General:   Vital Signs: Therapy Vitals Pulse Rate: 80 BP: 116/70 mmHg Pain: Pain Assessment Pain Assessment: No/denies pain Faces Pain Scale: Hurts whole lot Pain Type: Chronic pain Pain Location: Back Pain Onset: With Activity Pain Intervention(s): Heat applied (requested NT set up K pad) ADL:   Exercises:   Other Treatments:    See FIM for current functional status  Therapy/Group: Individual Therapy  SAGUIER,JULIA 03/24/2015, 11:55 AM

## 2015-03-24 NOTE — Progress Notes (Signed)
Occupational Therapy Session Note  Patient Details  Name: Linda [name] MRN: [id number] Date of Birth: Nov 20, 1928  Today's Date: 03/24/2015 OT Individual Time: 1941-7408 OT Individual Time Calculation (min): 30 min    Short Term Goals: No short term goals set  Skilled Therapeutic Interventions/Progress Updates:    Engaged in therapeutic activity with focus on functional ambulation, use of RUE, and cognitive processing.  Pt ambulated to therapy gym with RW and min/steady assist, with therapist providing verbal cues for advancement of RLE as pt tends to shuffle and drag RLE as she fatigues.  Engaged in simulated pill sorting activity with focus on following directions, opening containers, and [place] with manipulation of small beads.  Pt required demonstration cue to open and sort pills.  Ambulated back to room as above and left seated in w/c with all needs in reach.  Therapy Documentation Precautions:  Precautions Precautions: Fall, Other (comment) Precaution Comments: Aphasia Restrictions Weight Bearing Restrictions: No General:   Vital Signs: Therapy Vitals Temp: 97.8 F (36.6 C) Temp Source: Oral Pulse Rate: 78 Resp: 16 BP: (!) 102/54 mmHg Patient Position (if appropriate): Lying Oxygen Therapy SpO2: 100 % O2 Device: Not Delivered Pain: Pain Assessment Pain Score: 3   See FIM for current functional status  Therapy/Group: Individual Therapy  Simonne Come 03/24/2015, 3:33 PM

## 2015-03-25 ENCOUNTER — Inpatient Hospital Stay (HOSPITAL_COMMUNITY): Payer: Medicare Other | Admitting: Occupational Therapy

## 2015-03-25 ENCOUNTER — Inpatient Hospital Stay (HOSPITAL_COMMUNITY): Payer: Medicare Other | Admitting: *Deleted

## 2015-03-25 ENCOUNTER — Inpatient Hospital Stay (HOSPITAL_COMMUNITY): Payer: Medicare Other | Admitting: Speech Pathology

## 2015-03-25 LAB — BASIC METABOLIC PANEL
ANION GAP: 6 (ref 5–15)
BUN: 19 mg/dL (ref 6–20)
CALCIUM: 8.6 mg/dL — AB (ref 8.9–10.3)
CO2: 29 mmol/L (ref 22–32)
Chloride: 98 mmol/L — ABNORMAL LOW (ref 101–111)
Creatinine, Ser: 0.83 mg/dL (ref 0.44–1.00)
GFR calc Af Amer: >60 mL/min (ref >60)
GFR calc non Af Amer: >60 mL/min (ref >60)
GLUCOSE: 103 mg/dL — AB (ref 65–99)
Potassium: 3.6 mmol/L (ref 3.5–5.1)
Sodium: 133 mmol/L — ABNORMAL LOW (ref 135–145)

## 2015-03-25 MED ORDER — TRAMADOL HCL 50 MG PO TABS: 50.0000 mg | ORAL_TABLET | Freq: Two times a day (BID) | ORAL | Status: DC

## 2015-03-25 MED ORDER — ACETAMINOPHEN 500 MG PO TABS: 500.0000 mg | ORAL_TABLET | Freq: Four times a day (QID) | ORAL | Status: DC | PRN

## 2015-03-25 MED ORDER — SENNA-DOCUSATE SODIUM 8.6-50 MG PO TABS: 2.0000 | ORAL_TABLET | Freq: Every day | ORAL | Status: DC | PRN

## 2015-03-25 MED ORDER — ONDANSETRON HCL 4 MG PO TABS: 4.0000 mg | ORAL_TABLET | Freq: Three times a day (TID) | ORAL | Status: DC | PRN

## 2015-03-25 MED ORDER — PANTOPRAZOLE SODIUM 40 MG PO PACK: 40.0000 mg | PACK | Freq: Every day | ORAL | Status: DC

## 2015-03-25 MED ORDER — APIXABAN 2.5 MG PO TABS
2.5000 mg | ORAL_TABLET | Freq: Two times a day (BID) | ORAL | Status: DC
Start: 2015-03-25 — End: 2015-05-25

## 2015-03-25 MED ORDER — SERTRALINE HCL 50 MG PO TABS: 50.0000 mg | ORAL_TABLET | Freq: Every day | ORAL | Status: DC

## 2015-03-25 MED ORDER — SENNOSIDES-DOCUSATE SODIUM 8.6-50 MG PO TABS: 2.0000 | ORAL_TABLET | Freq: Every evening | ORAL | Status: DC | PRN

## 2015-03-25 MED ORDER — LEVOTHYROXINE SODIUM 50 MCG PO TABS: 50.0000 ug | ORAL_TABLET | Freq: Every morning | ORAL | Status: DC

## 2015-03-25 MED ORDER — METHOCARBAMOL 500 MG PO TABS: 500.0000 mg | ORAL_TABLET | Freq: Three times a day (TID) | ORAL | Status: DC

## 2015-03-25 MED ORDER — METOPROLOL TARTRATE 25 MG PO TABS: 25.0000 mg | ORAL_TABLET | Freq: Two times a day (BID) | ORAL | Status: AC

## 2015-03-25 NOTE — Consult Note (Signed)
   [facility] CM Inpatient Consult   03/25/2015  Linda [name] [birth year] [id number] Met with the patient's son, Linda [name], in the [facility] rehab center.  Linda [name] endorses that Linda [name], his brother is the patient's Power of Helper and that Linda [name] is working today.  Folder with [place] [employer] was given to take back for the family to review.  Encouraged Linda [name] to have Linda [name] call [place] [employer] department, if he would like services.  Explained that [place] [employer] could provide information on resources, provide follow up calls to evaluate the patient's need for home visit follow up from [place] [employer].  He states that they have had [place] in the past.  He states that they are looking into a long term care policy that their mother may have availalble.   Linda [name] states that he will take the folder with [place] [employer] information  Back with him.  Explained that [place] [employer] does not replace or interfere with services arranged from the inpatient rehab staff, at no additional cost.  Explained to [place] that more specific details could be provided to his brother, Linda [name]. He states that Linda [name] will be working and is not available currently.  He endorses that he will be returning to work next month and that they are working on providing the care recommended by the rehab staff needed for the patient.  Consent will have to come from his brother, Linda [name], power of attorney, for services.  For questions, please contact: Natividad Brood, RN BSN [facility] Liaison  [phone] business mobile phone

## 2015-03-25 NOTE — Progress Notes (Addendum)
Occupational Therapy Discharge Summary  Patient Details  Name: Linda [name] MRN: [id number] Date of Birth: 10-18-1928   Patient has met 8 of 8 long term goals due to improved activity tolerance, ability to compensate for deficits, improved attention and improved awareness.  Patient to discharge at [facility] Assist level.  Patient's care partner is independent to provide the necessary physical and cognitive assistance at discharge.  Pt will have 24/7 care at home.  Reasons goals not met: n/a  Recommendation:  Patient will benefit from ongoing skilled OT services in home health setting to continue to advance functional skills in the area of BADL.  Equipment: No equipment provided  Reasons for discharge: treatment goals met  Patient/family agrees with progress made and goals achieved: Yes  OT Discharge ADL ADL ADL Comments: Overall steady A level Vision/Perception  Vision- History Baseline Vision/History: Wears glasses Vision- Assessment Eye Alignment: Within Functional Limits  Cognition Overall Cognitive Status: History of cognitive impairments - at baseline Sensation Sensation Light Touch: Appears Intact Stereognosis: Not tested Hot/Cold: Appears Intact Proprioception: Appears Intact Coordination Gross Motor Movements are Fluid and Coordinated: Yes Fine Motor Movements are Fluid and Coordinated: No Motor  Motor Motor: Motor apraxia Motor - Discharge Observations: Improved use and attention to R-side noted but pt's velocity and excursion of movements may also be related to chronic pain Mobility    steady to min A with transfers and ambulation Trunk/Postural Assessment    severe kyphosis and scoliosis Balance Dynamic Sitting Balance Dynamic Sitting - Level of Assistance: 5: Stand by assistance Static Standing Balance Static Standing - Level of Assistance: 5: Stand by assistance Dynamic Standing Balance Dynamic Standing - Level of Assistance: 4: Min  assist Extremity/Trunk Assessment RUE Assessment RUE Assessment:  (strength 4/5) LUE Assessment LUE Assessment:  (strength 4/5)  See FIM for current functional status  SAGUIER,JULIA 03/25/2015, 12:35 PM

## 2015-03-25 NOTE — Progress Notes (Signed)
Speech Language Pathology Discharge Summary  Patient Details  Name: Linda [name] MRN: [id number] Date of Birth: 09/24/1928  Today's Date: 03/25/2015 SLP Individual Time: 0901-1001 SLP Individual Time Calculation (min): 60 min   Skilled Therapeutic Interventions:  Pt was seen for skilled ST targeting cognitive goals.  Upon arrival, pt was seated upright in wheelchair with head resting on table tray.  Pt was awake, lethargic but agreeable to participate in ST.  Pt indicated that she needed to use the restroom via yes/no responses and required min assist verbal cues for initiation and sequencing of toilet transfer, pulling pants down and then back up after toileting, and hand hygiene.  SLP facilitated the session with a kitchen activity targeting functional problem solving for basic home management.  Pt located items needed to make a simple snack from the kitchen cabinets with overall min assist verbal and visual cues.  She also benefited from min assist verbal cues to request assistance with opening up packages and containers for snack preparation.  Pt with complaints of back pain as indicated by pt grimacing and rubbing back; as a result, her participation in structured therapeutic activities was limited.  RN made aware.  Pt was returned to bed at the end of today's session and K pad applied to back.  Pt left in bed with bed alarm activated and all needs within reach.      Patient has met 1 of 4 long term goals.  Patient to discharge at overall Mod level.  Reasons goals not met:  Pt continues to require mod-max cues to convey needs and wants  Clinical Impression/Discharge Summary:  Pt made slow, limited gains while inpatient and is discharging having met 1 out of 4 long term goals.  Pt currently requires mod-max assist multimodal cues to convey basic needs and wants and min-mod assist for basic, familiar functional tasks.  Pt continues to present with a severe global aphasia and severe cognitive  deficits characterized by decreased sustained attention to tasks, decreased recall of new information, decreased functional problem solving, decreased initiation, and decreased safety awareness.  Pt's progress in therapies has also been limited by declining motivation to participate in therapies and likely baseline fluctuations in mentation.  Pt is discharging home where it is reported that her sons will provide 24/7 supervision; however, family has not been present for education during pt's admission.  SLP recommends extensive 24/7 supervision, assistance for mediations and finances, and ST follow up at next level of care to continue to address cognitive-linguistic deficits and ongoing family education to maximize pt's quality of life.    Care Partner:  Caregiver Able to Provide Assistance: Other (comment) (per report, pt's sons will be able to provide 24/7 supervision; they have not been present for training )  Type of Caregiver Assistance: Physical;Cognitive  Recommendation:  Home Health SLP;24 hour supervision/assistance  Rationale for SLP Follow Up: Maximize functional communication;Maximize cognitive function and independence;Reduce caregiver burden   Equipment: none recommended    Reasons for discharge: Discharged from hospital   Patient/Family Agrees with Progress Made and Goals Achieved: Yes   See FIM for current functional status  ,  L 03/25/2015, 8:01 AM    

## 2015-03-25 NOTE — Progress Notes (Signed)
Social Work Patient ID: [name], female   DOB: July 19, 1929, 81 y.o.   MRN: [id number] Spoke with Azzie Glatter to discuss having him come in tomorrow at 10;00 to go through PT prior to discharge. He is willing to come in and is aware of the team's recommendation Of 24 hr care at home. Family to rotate and provide 24 hr care as was doing prior to admission here. Expressed the concerns this worker has regarding pt being worse than prior to admission with her balance and  Function and the high risk of falling she will have at home if someone is not providing hands on care. [facility] met with son-Mark today to explain services and follow in the community. [facility] to resume services. Family aware of the resources and private duty agencies and short term NHP if needed. Plan to go home tomorrow.

## 2015-03-25 NOTE — Consult Note (Signed)
   [facility] Inpatient Consult   03/25/2015  JENNYE RUNQUIST 22-Apr-1929 [id number] This writer spoke with the patient's son, Artice Holohan, [phone], [place] verified, regarding general information about [place] [employer] for post rehabilitation community follow up.  Elta Guadeloupe endorses that his mother's primary care provider is Dr. Deland Pretty.  He also endorses that he is suppose to have a educational session with the physical therapist today and would be available to speak with this Probation officer about the [place] [employer] program.  Contact information given to [facility].   Will plan to follow up at the [facility] inpatient rehab center.  For questions, please contact: Natividad Brood, RN BSN [facility] Liaison  [phone] business mobile phone

## 2015-03-25 NOTE — Discharge Summary (Addendum)
Physician Discharge Summary  Patient ID: Linda [name] MRN: [id number] DOB/AGE: November 22, 1928 81 y.o.  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   Labs:  Basic Metabolic Panel:  Recent Labs Lab 03/23/15 1416 03/25/15 0754  NA 134* 133*  K 3.9 3.6  CL 103 98*  CO2 24 29  GLUCOSE 133* 103*  BUN 24* 19  CREATININE 0.99 0.83  CALCIUM 8.7* 8.6*    CBC: CBC Latest Ref Rng 03/18/2015 03/16/2015 03/15/2015  WBC 4.0 - 10.5 K/uL 9.2 8.3 8.9  Hemoglobin 12.0 - 15.0 g/dL 13.8 12.5 13.5  Hematocrit 36.0 - 46.0 % 41.6 37.4 39.4  Platelets 150 - 400 K/uL 349 255 266     CBG: No results for input(s): GLUCAP in the last 168 hours.  Brief HPI:   Linda [name] is a 81 y.o. 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 She was readmitted on 03/14/15 with malaise and refusal to get out of bed as well as confusion. She was started on IV rocephin for recurrent UTI and has had improvement in mentation. X rays of lumbar and thoracic spine with diffuse degenerative changes. robaxin was scheduled to help with neck/back spasms. ST evaluation without evidence of dysphagia. PT evaluation done yesterday and CIR recommended for follow up therapy   Hospital Course: Linda [name] was admitted to rehab 03/17/2015 for inpatient therapies to consist of PT, ST and OT at least three hours five days a week. Past admission  physiatrist, therapy team and rehab RN have worked together to provide customized collaborative inpatient rehab. Blood pressures have been controlled on metoprolol. Po intake has been variable and she has required much encouragement for consistent intake. Serial labs have been monitored during her stay and she was treated with IVF briefly to help with dehydration.  Acute renal failure has resolved and hyponatremia noted likely due to IVF supplementation. Conjunctivitis has resolved with use of Tobradex eye drops as well as warm compresses.  Neck pain and spasms have been managed with low dose ultram as well as robaxin. Heat has also been used on prn basis. Patient has progressed to supervision level. She will continue to receive follow up [place] [place], [place], [place] and [facility] by [facility] past discharge.    Rehab course: During patient's stay in rehab weekly team conferences were held to monitor patient's progress, set goals and discuss barriers to discharge. At admission, patient required moderate assistance with mobility and self care tasks. She had moderate expressive > receptive aphasia due to prior L-MCA and was tolerating dysphagia 3 diet with extra time for mastication. She has had improvement in activity tolerance, balance, postural control, as well as ability to compensate for deficits. She requires steady assist for bathing and lower body dressing and min assist with upper body dressing. She requires moderate assist with transfers and is able to ambulation with supervision.  She continues to have sever global aphasia with severe cognitive deficits. Her progress in ST  has been limited by poor motivation. Family education was done with son regarding all aspects of care as well as need for 24 hours supervision.     Disposition:  Home   Diet: Soft foods.      Medication List    STOP taking these medications        meclizine 25 MG tablet  Commonly known as:  ANTIVERT     tobramycin 0.3 %  ophthalmic ointment  Commonly known as:  TOBREX      TAKE these medications        acetaminophen 500 MG tablet  Commonly known as:  TYLENOL  Take 1 tablet (500 mg total) by mouth every 6 (six) hours as needed for mild pain. Usually only takes 2 x a  day     apixaban 2.5 MG Tabs tablet  Commonly known as:  ELIQUIS  Take 1 tablet (2.5 mg total) by mouth 2 (two) times daily.     calcium gluconate 500 MG tablet  Take 500 mg by mouth 2 (two) times daily. With 1000 units D3     CENTRUM SILVER PO  Take 1 tablet by mouth daily with lunch.     cholecalciferol 1000 UNITS tablet  Commonly known as:  VITAMIN D  Take 1,000 Units by mouth 2 (two) times daily. Take with calium     Cranberry 200 MG Caps  Take 1 capsule by mouth daily with lunch.     denosumab 60 MG/ML Soln injection  Commonly known as:  PROLIA  Inject 60 mg into the skin every 6 (six) months. Administer in upper arm, thigh, or abdomen     docusate sodium 100 MG capsule  Commonly known as:  COLACE  Take 100 mg by mouth 2 (two) times daily as needed for mild constipation or moderate constipation.     Flax Seed Oil 1000 MG Caps  Take 1 capsule by mouth 2 (two) times daily.     hyoscyamine 0.125 MG Tbdp disintergrating tablet  Commonly known as:  ANASPAZ  Place 0.125 mg under the tongue 2 (two) times daily as needed for bladder spasms.     levothyroxine 50 MCG tablet  Commonly known as:  SYNTHROID, LEVOTHROID  Take 50 mcg by mouth every morning.     methocarbamol 500 MG tablet  Commonly known as:  ROBAXIN  Take 1 tablet (500 mg total) by mouth 3 (three) times daily.     metoprolol tartrate 25 MG tablet  Commonly known as:  LOPRESSOR  Take 25 mg by mouth 2 (two) times daily.     ondansetron 4 MG tablet  Commonly known as:  ZOFRAN  Take 1 tablet (4 mg total) by mouth every 8 (eight) hours as needed for nausea or vomiting.     pantoprazole sodium 40 mg/20 mL Pack  Commonly known as:  PROTONIX  Take 20 mLs (40 mg  total) by mouth daily.     rOPINIRole 0.25 MG tablet  Commonly known as:  REQUIP  TAKE 1 TABLET BY MOUTH 3 TIMES A DAY     sennosides-docusate sodium 8.6-50 MG tablet  Commonly known as:  SENOKOT-S  Take 2 tablets by mouth daily as needed for constipation.     sertraline 50 MG tablet  Commonly known as:  ZOLOFT  Take 1 tablet (50 mg total) by mouth daily.     SYSTANE BALANCE OP  Apply 1 drop to eye 2 (two) times daily.     traMADol 50 MG tablet--Rx #  60 pills   Commonly known as:  ULTRAM  Take 1 tablet (50 mg total) by mouth 2 (two) times daily. For back/neck pain       Follow-up Information    Follow up with [name], MD On 05/02/2015.   Specialty:  Physical Medicine and Rehabilitation   Why:  Be there at  1:45 for 2 pm appointment    Contact information:   [address] [place] [address] [phone]       Follow up with [name], MD On 04/06/2015.   Specialty:  Internal Medicine   Why:  appt @ 10;30 Am   Contact information:   [address] [place] [address] [phone]       Signed: Bary Leriche 03/30/2015, 10:57 AM

## 2015-03-25 NOTE — Progress Notes (Signed)
Subjective/Complaints: Pt eating breakfast, no c/o but is severely aphasic ROS- cannot obtain due to aphasia Objective: Vital Signs: Blood pressure 91/65, pulse 84, temperature 98.3 F (36.8 C), temperature source Oral, resp. rate 18, weight 44.135 kg (97 lb 4.8 oz), last menstrual period 09/03/1996, SpO2 100 %. No results found. Results for orders placed or performed during the hospital encounter of 03/17/15 (from the past 72 hour(s))  Basic metabolic panel     Status: Abnormal   Collection Time: 03/23/15  2:16 PM  Result Value Ref Range   Sodium 134 (L) 135 - 145 mmol/L   Potassium 3.9 3.5 - 5.1 mmol/L   Chloride 103 101 - 111 mmol/L   CO2 24 22 - 32 mmol/L   Glucose, Bld 133 (H) 65 - 99 mg/dL   BUN 24 (H) 6 - 20 mg/dL   Creatinine, Ser 0.99 0.44 - 1.00 mg/dL   Calcium 8.7 (L) 8.9 - 10.3 mg/dL   GFR calc non Af Amer 51 (L) >60 mL/min   GFR calc Af Amer 59 (L) >60 mL/min    Comment: (NOTE) The eGFR has been calculated using the CKD EPI equation. This calculation has not been validated in all clinical situations. eGFR's persistently <60 mL/min signify possible Chronic Kidney Disease.    Anion gap 7 5 - 15     HEENT: normal Eyes, without redness or discharge  Extremity:  No Edema Skin:   Intact Neuro: Abnormal Sensory cannot assess secondary to aphasia, Abnormal Motor 4-/5 on R side , 5-/5 on left, Aphasic and Apraxic Musc/Skel:  Normal Gen NAD Lungs clear to auscultation Heart irregularly irregular, no murmurs  Assessment/Plan: 1. 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 Physiatrist is providing close team supervision and 24 hour management of active medical problems listed below. Physiatrist and rehab team continue to assess barriers to discharge/monitor patient progress toward functional and medical  goals. Plan D/C in am FIM: FIM - Bathing Bathing Steps Patient Completed: Chest, Right Arm, Left Arm, Abdomen, Right upper leg, Left upper leg, Front perineal area, Buttocks, Right lower leg (including foot), Left lower leg (including foot) Bathing: 4: Steadying assist  FIM - Upper Body Dressing/Undressing Upper body dressing/undressing steps patient completed: Thread/unthread right sleeve of pullover shirt/dresss, Thread/unthread left sleeve of pullover shirt/dress, Put head through opening of pull over shirt/dress, Pull shirt over trunk, Thread/unthread right bra strap, Thread/unthread left bra strap Upper body dressing/undressing: 4: Min-Patient completed 75 plus % of tasks FIM - Lower Body Dressing/Undressing Lower body dressing/undressing steps patient completed: Thread/unthread right underwear leg, Thread/unthread left underwear leg, Pull underwear up/down, Thread/unthread right pants leg, Thread/unthread left pants leg, Pull pants up/down, Don/Doff right shoe, Don/Doff left shoe Lower body dressing/undressing: 4: Steadying Assist  FIM - Toileting Toileting steps completed by patient: Adjust clothing prior to toileting, Performs perineal hygiene, Adjust clothing after toileting Toileting Assistive Devices: Grab bar or rail for support Toileting: 4: Steadying assist  FIM - Radio producer Devices: Recruitment consultant Transfers: 4-To toilet/BSC: Min A (steadying Pt. > 75%), 4-From toilet/BSC: Min A (steadying Pt. > 75%)  FIM - Bed/Chair Transfer Bed/Chair Transfer Assistive Devices: HOB elevated, Arm rests, Adult nurse Transfer: 4: Chair or W/C > Bed: Min A (steadying Pt. > 75%), 4: Bed > Chair or W/C: Min A (steadying Pt. > 75%)  FIM - Locomotion: Wheelchair Distance:  25 Locomotion: Wheelchair: 1: Total Assistance/staff pushes wheelchair (Pt<25%) FIM - Locomotion: Ambulation Locomotion: Ambulation Assistive Devices: Walker - Rolling, Other  (comment) Ambulation/Gait Assistance: 4: Min assist Locomotion: Ambulation: 4: Travels 150 ft or more with minimal assistance (Pt.>75%)  Comprehension Comprehension Mode: Auditory Comprehension: 4-Understands basic 75 - 89% of the time/requires cueing 10 - 24% of the time  Expression Expression Mode: Verbal Expression: 2-Expresses basic 25 - 49% of the time/requires cueing 50 - 75% of the time. Uses single words/gestures.  Social Interaction Social Interaction: 2-Interacts appropriately 25 - 49% of time - Needs frequent redirection.  Problem Solving Problem Solving: 2-Solves basic 25 - 49% of the time - needs direction more than half the time to initiate, plan or complete simple activities  Memory Memory: 2-Recognizes or recalls 25 - 49% of the time/requires cueing 51 - 75% of the time  Medical Problem List and Plan: 1. Functional deficits secondary to Subacute left PCA distribution infarct superimposed on previous left MCA infarct causing aphasia as well as balance deficits, reviewed therapy notes,meeting with the therapist today 2.  A fib/DVT Prophylaxis/Anticoagulation: Pharmaceutical: Other (comment)--Eliquis, HR controlled 3. Pain Management: Continue scheduled robaxin for chronic neck pain. Will also schedule tylenol and add Sportscreme to help with pain control. 4. Mood: LCSW to follow and assist patient and family with issues.   5. Neuropsych: This patient is not capable of making decisions on her own behalf. 6. Skin/Wound Care: Routine pressure relief measures. Elevated feet when in bed.   7. Fluids/Electrolytes/Nutrition: Monitor I/O.IVF tonite Offer supplements between meals.  oral intake around 240 mL yesterday, meals were 10-30 % but pt drinking some ensure as well 8. A fib: Monitor heart rate bid. Continue lopressor bid.  HR controlled but BP on low side, systolic 91 this am, check orthostatics 9.  Recent UTI, s/p abx high risk pt will reculture 10. Conjunctivitis: may be  recurring resume tobradex 11. RLS: continue Requip tid.    LOS (Days) 8 A FACE TO FACE EVALUATION WAS PERFORMED  KIRSTEINS,ANDREW E 03/25/2015, 7:22 AM    

## 2015-03-25 NOTE — Progress Notes (Addendum)
Physical Therapy Discharge Summary  Patient Details  Name: Linda [name] MRN: [id number] Date of Birth: Nov 03, 1928  Today's Date: 03/25/2015 PT Individual Time: 0805-0900 PT Individual Time Calculation (min): 55 min    Patient has met 7 of 7 long term goals due to improved activity tolerance, improved balance, decreased pain, ability to compensate for deficits and functional use of  right lower extremity.  Patient to discharge at an ambulatory level Supervision.   Patient's care partner indicates that 24/7 S will be provided by hired caregiver, which is the recommendation of rehab as pt continues to demonstrate posterior sway and increased risk of falls, especially posteriorly.  to provide the necessary physical, cognitive and safety assistance at discharge.  Reasons goals not met: N/A  Recommendation:  Patient will benefit from ongoing skilled PT services in home health setting to continue to advance safe functional mobility, address ongoing impairments in balance, postural control, generalized weakness, pain, aphasia, cognition, and minimize fall risk.  Equipment: RW  Reasons for discharge: treatment goals met and discharge from hospital  Patient/family agrees with progress made and goals achieved: Family not available at DC to discuss  Pt tx focused on continued activity tolerance, functional balance training, gait with RW in home setting, on carpet, and controlled settings. Pt indicated appreciating the K-pad for heated pain relief. Pt needs close S throughout due to increased risk of posterior falls, but demonstrates good use of RW. Pt seemed to have decreased R step clearance compared to eval, which may be related to pain. Pt performed stairs with 1 rail and Min A to simulate home entry and car transfer with close S and cues for technique. Pt still limited by motor apraxia and global aphasia. Pain seems well controlled today, but tx was modified for comfort when pt indicated needing a  rest. See FIM and DC for full details. Pt prepared for DC tomorrow, however no family present for training as requested by rehab. A DC summary of recommendations was left for family regarding safety.   PT Discharge Precautions/Restrictions Precautions Precautions: Fall;Other (comment) Precaution Comments: Aphasia Restrictions Weight Bearing Restrictions: No Vital Signs Therapy Vitals Temp: 98.3 F (36.8 C) Temp Source: Oral Pulse Rate: 90 Resp: 18 BP: 106/60 mmHg Patient Position (if appropriate): Lying Pain - pt rubbing L neck, RN provided timed pain meds.    Vision/Perception  Vision - History Baseline Vision: Wears glasses all the time Patient Visual Report: No change from baseline Vision - Assessment Eye Alignment: Within Functional Limits Perception Perception: Within Functional Limits Praxis Praxis: Impaired Praxis Impairment Details: Ideomotor;Motor planning Praxis-Other Comments: Pt has improved motor planning but occasionally needs cues   Cognition Overall Cognitive Status: History of cognitive impairments - at baseline Arousal/Alertness: Awake/alert Orientation Level: Oriented to person;Disoriented to place;Disoriented to time;Disoriented to situation Attention: Sustained Sustained Attention: Impaired Sustained Attention Impairment: Verbal basic;Functional basic Memory: Impaired Memory Impairment: Storage deficit;Retrieval deficit Awareness: Impaired Awareness Impairment: Intellectual impairment Problem Solving: Impaired Problem Solving Impairment: Functional basic Executive Function:  (all impaired due to lower level deficits ) Behaviors: Restless;Other (comment) (anxious ) Safety/Judgment: Impaired Sensation Sensation Light Touch: Appears Intact Proprioception: Appears Intact Coordination Gross Motor Movements are Fluid and Coordinated: Yes Fine Motor Movements are Fluid and Coordinated: No Coordination and Movement Description: Excursion and  velocity reduced, partially due to pain; but functional Finger Nose Finger Test: decreased accuracy Heel Shin Test: Decreased excursion Motor  Motor Motor: Motor apraxia Motor - Skilled Clinical Observations: Pt has movement in all extremities, but deomnstrates  inconsistent levels of sustained contrations and attention to R-side.  Motor - Discharge Observations: Improved use and attention to R-side noted but pt's velocity and excursion of movements may also be related to chronic pain  Mobility Bed Mobility Bed Mobility: Supine to Sit Supine to Sit: 6: Modified independent (Device/Increase time) Supine to Sit Details (indicate cue type and reason): Increased time required due to pain Transfers Transfers: Yes Squat Pivot Transfers: 5: Supervision Squat Pivot Transfer Details (indicate cue type and reason): Cues needed for safety to avoid posterior sway Locomotion  Ambulation Ambulation: Yes Ambulation/Gait Assistance: 5: Supervision Ambulation Distance (Feet): 150 Feet Assistive device: Rolling walker Ambulation/Gait Assistance Details:  Multi-modal cues for increased foot clearance and stride length ineffective. R-step with descreased step length and height compared to eval.  Gait Gait: Yes Gait Pattern: Impaired Gait Pattern: Step-to pattern;Decreased step length - right;Decreased step length - left;Decreased stance time - right;Decreased dorsiflexion - right;Decreased dorsiflexion - left;Decreased weight shift to right;Shuffle;Poor foot clearance - right Gait velocity: Decreased Stairs / Additional Locomotion Stairs: Yes Stairs Assistance: 4: Min assist Stairs Assistance Details (indicate cue type and reason): Steadying and safety cues for descent Stair Management Technique: One rail Left;Alternating pattern Number of Stairs: 4 Height of Stairs: 6 Wheelchair Mobility Wheelchair Mobility: No  Trunk/Postural Assessment  Cervical Assessment Cervical Assessment: Exceptions to [facility]  (Limited by chronic pain and ROM) Thoracic Assessment Thoracic Assessment: Exceptions to [facility] (kyphotic) Lumbar Assessment Lumbar Assessment: Exceptions to [facility] ((Chronic pain and degeneration) Postural Control Postural Control: Deficits on evaluation (Decreased postural mobility, rigid, scoliotic)  Balance Balance Balance Assessed: Yes Timed Up and Go Test TUG: Normal TUG Normal TUG (seconds): 35 Dynamic Sitting Balance Dynamic Sitting - Balance Support: Feet supported Dynamic Sitting - Level of Assistance: 5: Stand by assistance Static Standing Balance Static Standing - Balance Support: Right upper extremity supported;Left upper extremity supported;During functional activity Static Standing - Level of Assistance: 5: Stand by assistance Dynamic Standing Balance Dynamic Standing - Balance Support: Right upper extremity supported;Left upper extremity supported;During functional activity Dynamic Standing - Level of Assistance: 5: Stand by assistance Dynamic Standing - Balance Activities: Lateral lean/weight shifting;Forward lean/weight shifting Dynamic Standing - Comments: Pt needs close S due to posterior sway Extremity Assessment      RLE Assessment RLE Assessment: Exceptions to [facility] (3+//5 throughout) RLE PROM (degrees) RLE Overall PROM Comments: Decreased ankle DF -5 deg, HS limited as well  LLE Assessment LLE Assessment: Exceptions to WFL LLE PROM (degrees) LLE Overall PROM Comments: Decreased ankle DF -3 deg, HS limited as well  LLE Strength LLE Overall Strength Comments: Grossly 4/5 throughout, limited assessment due to aphasia  See FIM for current functional status  Barbarita Hutmacher, [place], PT, DPT  03/25/2015, 9:04 AM

## 2015-03-25 NOTE — Progress Notes (Signed)
Social Work Discharge Note Discharge Note  The overall goal for the admission was met for:   Discharge location: Yes-HOME WITH SON-MICHAEL AND MARK TO ASSIST WITH HER CARE  Length of Stay: Yes-9 DAYS  Discharge activity level: Yes-SUPERVISION/MIN LEVEL  Home/community participation: Yes  Services provided included: MD, RD, PT, OT, SLP, RN, CM, TR, Pharmacy and SW  Financial Services: Medicare and Private Insurance: [place]  Follow-up services arranged: Home Health: [facility]-PT,OT,SP,RN,SW and Patient/Family request agency HH: [facility] PT, DME: HAS ALL NECESSARY EQUIPMENT  Comments (or additional information):FAMILY AWARE TEAM RECOMMENDS 24 HR CARE AND WERE ROTATING PRIOR TO ADMISSION, BUT TIMES SHE WAS ALONE. GIVEN PRIVATE DUTY LIST BUT CONCERN ABOUT AFFORDABILITY AND HAS BEEN TO NH AND FAMILY DOES NOT WANT HER TO GO BACK TO ONE. PT HAS MORE BALANCE ISSUES AND IS AT HIGH RISK TO FALL IF DOESN'T HAVE HANDS ON CARE. FAMILY CONVINCED MEDICARE WILL PROVIDE 24 HR CARE WITH A MD PRESCRIPTION-INFORMED THIS IS NOT TRUE. THN TO HOPEFULLY FOLLOW IN THE COMMUNITY-ONCE SON AGREE'S TO THIS. PT APPEARS TO BE FTT AND HOSPICE MAY BE APPROPRIATE IF SHE CONTINUES TO DECLINE.  Patient/Family verbalized understanding of follow-up arrangements: Yes  Individual responsible for coordination of the follow-up plan: MICHAEL-SON & MARK-SON  Confirmed correct DME delivered: Linda [name] 03/25/2015    Linda [name]

## 2015-03-25 NOTE — Progress Notes (Signed)
Social Work Patient ID: [name], female   DOB: 1929-05-27, 81 y.o.   MRN: [id number] Spoke with Lauro Regulus who wanted to know when she had PT today, he has missed it. Could come to OT session in afternoon at 1;00 but he really needs to see PT.  Legrand Como is the son Actually coming to pick up pt tomorrow. Will try to work out PT session tomorrow. Concerns worker children have not seen pt in therapies and feel they can do the care she needs at home. This worker feels pt is not doing as well now as she was at home prior to admission to the hospital. Son reported she was moving around by herself, she has balance issues here and leans posterioraly Which will make her a high risk to fall at home if they are not hands on ambulating with her. Have added a HHSW to referral to [facility].

## 2015-03-25 NOTE — Progress Notes (Signed)
Occupational Therapy Session Note  Patient Details  Name: ELIANIE HUBERS MRN: [id number] Date of Birth: 1929-07-23  Today's Date: 03/25/2015 OT Individual Time: 1300-1330 OT Individual Time Calculation (min): 30 min    Skilled Therapeutic Interventions/Progress Updates:    1:1 therapetuic activity with focus on dyanmic standing balance to improve safety with functional mobility with ADL tasks. Performed basic transfers with steady A without RW. Standing task with functional reaching laterally and above head. Stair training for balance and activity tolerance/ endurance with steady A to close supervision with bilateral rails. UE therapeutic activity with 1 lb weighted ball for overall strengthening and coordination.   Therapy Documentation Precautions:  Precautions Precautions: Fall, Other (comment) Precaution Comments: Aphasia Restrictions Weight Bearing Restrictions: No Pain: No c/o pain in session  See FIM for current functional status  Therapy/Group: Individual Therapy  Willeen Cass [facility] 03/25/2015, 2:47 PM

## 2015-03-25 NOTE — Discharge Instructions (Signed)
Inpatient Rehab Discharge Instructions  Linda [name] Discharge date and time:    Activities/Precautions/ Functional Status: Activity: activity as tolerated Diet: cardiac diet Wound Care: none needed Functional status:  ___ No restrictions     ___ Walk up steps independently ___ 24/7 supervision/assistance   ___ Walk up steps with assistance ___ Intermittent supervision/assistance  ___ Bathe/dress independently ___ Walk with walker     ___ Bathe/dress with assistance ___ Walk Independently    ___ Shower independently ___ Walk with assistance    ___ Shower with assistance ___ No alcohol     ___ Return to work/school ________  Special Instructions:    COMMUNITY REFERRALS UPON DISCHARGE:    Home Health:   PT,OT,SP,RN,SW     [place]   Date of last service:03/26/2015  Medical Equipment/Items Ordered:HAS ALL NEEDED EQUIPMENT   Other:[facility] TO PICK UP AND FOLLOW IN THE COMMUNITY  GENERAL COMMUNITY RESOURCES FOR PATIENT/FAMILY: Support Groups:[facility] SUPPORT GROUP   My questions have been answered and I understand these instructions. I will adhere to these goals and the provided educational materials after my discharge from the hospital.  Patient/Caregiver Signature _______________________________ Date __________  Clinician Signature _______________________________________ Date __________  Please bring this form and your medication list with you to all your follow-up doctor's appointments.

## 2015-03-25 NOTE — Plan of Care (Signed)
Problem: RH Expression Communication Goal: LTG Patient will express needs/wants via multi-modal(SLP) LTG: Patient will express needs/wants via multi-modal communication (gestures/written, etc) with cues (SLP)  Outcome: Not Met (add Reason) Pt continues to require mod-max assist to convey needs/wants  Goal: LTG Patient will verbally express basic/complex needs (SLP) LTG: Patient will verbally express basic/complex needs, wants or ideas with cues (SLP)  Outcome: Not Met (add Reason) Pt continues to need mod-max cues to convey needs/wants  Goal: LTG Patient will increase word finding of common (SLP) LTG: Patient will increase word finding of common objects/daily info/abstract thoughts with cues using compensatory strategies (SLP).  Outcome: Not Met (add Reason) Pt continues to require mod-max cues for word finding

## 2015-03-25 NOTE — Progress Notes (Signed)
Occupational Therapy Session Note  Patient Details  Name: Linda [name] MRN: [id number] Date of Birth: 02-22-1929  Today's Date: 03/25/2015 OT Individual Time: 1030-1130 OT Individual Time Calculation (min): 60 min    Short Term Goals: Week 1:  OT Short Term Goal 1 (Week 1): Pt will complete toilet transfer with supervision with LRAD OT Short Term Goal 1 - Progress (Week 1): Not progressing OT Short Term Goal 2 (Week 1): Pt will complete shower transfers with min assist with LRAD OT Short Term Goal 2 - Progress (Week 1): Met OT Short Term Goal 3 (Week 1): Pt will complete LB dressing with supervision OT Short Term Goal 3 - Progress (Week 1): Not progressing OT Short Term Goal 4 (Week 1): Pt will complete 2 grooming tasks in standing with steady assist OT Short Term Goal 4 - Progress (Week 1): Met  Skilled Therapeutic Interventions/Progress Updates:    Pt seen for BADL retraining to facilitate motor planning, balance, and processing skills. Pt received in bed and agreeable to shower. Overall pt continues to need mod cues with clothing selection, orienting clothing to don them, cues to compensate for [name] and steady A with all standing/ambulation/self care. Pt continues to have a posterior lean and a resting tremor in standing that decreases her stability. Pt winced in pain with lifting L leg up to don pants. Pt was able to follow directions better today for simple RUE AROM.  Pt wanted to get back in bed to lay on kpads. All needs within reach.  Bed alarm set.    Therapy Documentation Precautions:  Precautions Precautions: Fall, Other (comment) Precaution Comments: Aphasia Restrictions Weight Bearing Restrictions: No   Pain: Pain Assessment Pain Assessment: No/denies pain ADL:  See FIM for current functional status  Therapy/Group: Individual Therapy  [place] 03/25/2015, 12:23 PM

## 2015-03-26 ENCOUNTER — Ambulatory Visit (HOSPITAL_COMMUNITY): Payer: Medicare Other | Admitting: Physical Therapy

## 2015-03-26 NOTE — Progress Notes (Signed)
Patient discharged about 1145 to home with son and all belongings. Son given discharge instructions today. All questions answered. Son to follow up with outpatient services. Patient alert to self but difficult to tell understanding with expressive aphasia. Prescription given to patient's son. The son denied any further questions. Patient IV removed. Vitals stable.

## 2015-03-26 NOTE — Progress Notes (Signed)
Physical Therapy Session Note  Patient Details  Name: Linda [name] MRN: [id number] Date of Birth: 11-Mar-1929  Today's Date: 03/26/2015 PT Individual Time: 1000-1100 PT Individual Time Calculation (min): 60 min   Short Term Goals: Week 1:  PT Short Term Goal 1 (Week 1): Pt will perform bed mobility with Min A supine>sit logrolling PT Short Term Goal 2 (Week 1): Pt will perform basic squat-pivot transfers with Min A consistently PT Short Term Goal 3 (Week 1): Pt will perform gait x50' with RW and Min A PT Short Term Goal 4 (Week 1): Pt will perform 4 stairs with 1 rail and Min A PT Short Term Goal 5 (Week 1): Pt will tolerate static standing during functional task x3 min for increased activity tolerance.   Skilled Therapeutic Interventions/Progress Updates:    Son present for family training.  Therapeutic Activity: Pt received up in w/c after toilet transfer with CNAs. PT demonstrates and instructs son in stand-step transfers w/c to/from bed with RW, focusing on sequencing/technique (hand placement) and guarding pt's back due to posterior LOB tendency. PT instructs son in bed mobility supine to/from sit transfers via side lying req mod A for B LEs into bed and min A at trunk for assistance to EOB.  PT instructs son in stand-step transfers with RW w/c to/from car, including: park w/c close to car but enough room for RW, ensure seat is in posterior position with backrest reclined partially for ease of leg clearance, always guard pt at her back side and have pt turn towards door when getting in car, guard pt's head when sitting/standing from car, take your time and if pt begins to lean back too much (when trying to walk backwards) then cue her to push the walker forward or assist her in pushing walker forward and to lean forward to regain balance and then attempt backwards steps, again. Son demonstrates back understanding.    Gait Training: PT demonstrates and instructs son in assisting pt with  ambulation with RW x 65' req min A - focus on guarding pt's back and on R side, as well as when pt begins dragging R leg and req more assist, this is a cue that a rest break is needed.  PT instructs and demonstrates son in assisting pt up/down 4 standard stairs with 1 handrail in step-to pattern req min A.   Husband reports feeling as if he is able to help pt at home, but then reports someone else will be staying with her during the day while he is at work. PT explains that hHPT can reinforce home training, but that it would be the son's responsibility to train whomever is with pt during the day in helping her in/out of bed and to/from chairs. Pt verbalizes understanding and reports they are ready to go home.   Therapy Documentation Precautions:  Precautions Precautions: Fall, Other (comment) Precaution Comments: Aphasia Restrictions Weight Bearing Restrictions: No Pain: Pain Assessment Pain Assessment: Faces Faces Pain Scale: Hurts little more Pain Type: Acute pain Pain Location: Abdomen Pain Orientation: Upper Pain Descriptors / Indicators: Aching Pain Onset: Gradual Pain Intervention(s): Rest;Repositioned Multiple Pain Sites: No  See FIM for current functional status  Therapy/Group: Individual Therapy  Carzell Saldivar M 03/26/2015, 11:15 AM

## 2015-03-26 NOTE — Progress Notes (Signed)
Subjective/Complaints:  ROS- cannot obtain due to aphasia Objective: Vital Signs: Blood pressure 94/62, pulse 74, temperature 97.6 F (36.4 C), temperature source Oral, resp. rate 16, weight 44.135 kg (97 lb 4.8 oz), last menstrual period 09/03/1996, SpO2 97 %. No results found. Results for orders placed or performed during the hospital encounter of 03/17/15 (from the past 72 hour(s))  Basic metabolic panel     Status: Abnormal   Collection Time: 03/23/15  2:16 PM  Result Value Ref Range   Sodium 134 (L) 135 - 145 mmol/L   Potassium 3.9 3.5 - 5.1 mmol/L   Chloride 103 101 - 111 mmol/L   CO2 24 22 - 32 mmol/L   Glucose, Bld 133 (H) 65 - 99 mg/dL   BUN 24 (H) 6 - 20 mg/dL   Creatinine, Ser 0.99 0.44 - 1.00 mg/dL   Calcium 8.7 (L) 8.9 - 10.3 mg/dL   GFR calc non Af Amer 51 (L) >60 mL/min   GFR calc Af Amer 59 (L) >60 mL/min    Comment: (NOTE) The eGFR has been calculated using the CKD EPI equation. This calculation has not been validated in all clinical situations. eGFR's persistently <60 mL/min signify possible Chronic Kidney Disease.    Anion gap 7 5 - 15  Basic metabolic panel     Status: Abnormal   Collection Time: 03/25/15  7:54 AM  Result Value Ref Range   Sodium 133 (L) 135 - 145 mmol/L   Potassium 3.6 3.5 - 5.1 mmol/L   Chloride 98 (L) 101 - 111 mmol/L   CO2 29 22 - 32 mmol/L   Glucose, Bld 103 (H) 65 - 99 mg/dL   BUN 19 6 - 20 mg/dL   Creatinine, Ser 0.83 0.44 - 1.00 mg/dL   Calcium 8.6 (L) 8.9 - 10.3 mg/dL   GFR calc non Af Amer >60 >60 mL/min   GFR calc Af Amer >60 >60 mL/min    Comment: (NOTE) The eGFR has been calculated using the CKD EPI equation. This calculation has not been validated in all clinical situations. eGFR's persistently <60 mL/min signify possible Chronic Kidney Disease.    Anion gap 6 5 - 15     HEENT: normal Eyes, without redness or discharge  Extremity:  No Edema Skin:   Intact Neuro: Abnormal Sensory cannot assess secondary to  aphasia, Abnormal Motor 4-/5 on R side , 5-/5 on left, Aphasic and Apraxic Musc/Skel:  Normal Gen NAD Lungs clear to auscultation Heart irregularly irregular, no murmurs  Assessment/Plan: 1. Functional deficits secondary to Left PCA infarct, subacute L MCA infarct aphasic and gait disorder  Stable for D/C today F/u PCP in 1-2 weeks F/u PM&R 3 weeks See D/C summary See D/C instructionsFIM: FIM - Bathing Bathing Steps Patient Completed: Chest, Right Arm, Left Arm, Abdomen, Right upper leg, Left upper leg, Front perineal area, Buttocks, Right lower leg (including foot), Left lower leg (including foot) Bathing: 4: Steadying assist  FIM - Upper Body Dressing/Undressing Upper body dressing/undressing steps patient completed: Thread/unthread right sleeve of pullover shirt/dresss, Thread/unthread left sleeve of pullover shirt/dress, Put head through opening of pull over shirt/dress, Pull shirt over trunk, Thread/unthread right bra strap, Thread/unthread left bra strap Upper body dressing/undressing: 4: Min-Patient completed 75 plus % of tasks FIM - Lower Body Dressing/Undressing Lower body dressing/undressing steps patient completed: Thread/unthread right underwear leg, Thread/unthread left underwear leg, Pull underwear up/down, Thread/unthread right pants leg, Thread/unthread left pants leg, Pull pants up/down, Don/Doff right shoe, Don/Doff left shoe, Don/Doff  right sock, Don/Doff left sock Lower body dressing/undressing: 4: Steadying Assist  FIM - Toileting Toileting steps completed by patient: Adjust clothing prior to toileting, Performs perineal hygiene, Adjust clothing after toileting Toileting Assistive Devices: Grab bar or rail for support Toileting: 4: Steadying assist  FIM - Radio producer Devices: Bedside commode Toilet Transfers: 4-From toilet/BSC: Min A (steadying Pt. > 75%), 4-To toilet/BSC: Min A (steadying Pt. > 75%)  FIM - Bed/Chair  Transfer Bed/Chair Transfer Assistive Devices: HOB elevated, Arm rests, Copy: 6: Supine > Sit: No assist, 5: Sit > Supine: Supervision (verbal cues/safety issues), 5: Bed > Chair or W/C: Supervision (verbal cues/safety issues)  FIM - Locomotion: Wheelchair Distance: 25 Locomotion: Wheelchair: 1: Total Assistance/staff pushes wheelchair (Pt<25%) FIM - Locomotion: Ambulation Locomotion: Ambulation Assistive Devices: Administrator Ambulation/Gait Assistance: 5: Supervision Locomotion: Ambulation: 5: Travels 150 ft or more with supervision/safety issues  Comprehension Comprehension Mode: Auditory Comprehension: 4-Understands basic 75 - 89% of the time/requires cueing 10 - 24% of the time  Expression Expression Mode: Verbal Expression: 2-Expresses basic 25 - 49% of the time/requires cueing 50 - 75% of the time. Uses single words/gestures.  Social Interaction Social Interaction: 2-Interacts appropriately 25 - 49% of time - Needs frequent redirection.  Problem Solving Problem Solving: 2-Solves basic 25 - 49% of the time - needs direction more than half the time to initiate, plan or complete simple activities  Memory Memory: 2-Recognizes or recalls 25 - 49% of the time/requires cueing 51 - 75% of the time  Medical Problem List and Plan: 1. Functional deficits secondary to Subacute left PCA distribution infarct superimposed on previous left MCA infarct causing aphasia as well as balance deficits, reviewed therapy notes,meeting with the therapist today 2.  A fib/DVT Prophylaxis/Anticoagulation: Pharmaceutical: Other (comment)--Eliquis, HR controlled    LOS (Days) 9 A FACE TO [place] E 03/26/2015, 6:51 AM

## 2015-03-27 DIAGNOSIS — M150 Primary generalized (osteo)arthritis: Secondary | ICD-10-CM | POA: Diagnosis not present

## 2015-03-27 DIAGNOSIS — I69320 Aphasia following cerebral infarction: Secondary | ICD-10-CM | POA: Diagnosis not present

## 2015-03-27 DIAGNOSIS — M810 Age-related osteoporosis without current pathological fracture: Secondary | ICD-10-CM | POA: Diagnosis not present

## 2015-03-27 DIAGNOSIS — I10 Essential (primary) hypertension: Secondary | ICD-10-CM | POA: Diagnosis not present

## 2015-03-27 DIAGNOSIS — I69351 Hemiplegia and hemiparesis following cerebral infarction affecting right dominant side: Secondary | ICD-10-CM | POA: Diagnosis not present

## 2015-03-27 DIAGNOSIS — I4891 Unspecified atrial fibrillation: Secondary | ICD-10-CM | POA: Diagnosis not present

## 2015-03-29 DIAGNOSIS — M150 Primary generalized (osteo)arthritis: Secondary | ICD-10-CM | POA: Diagnosis not present

## 2015-03-29 DIAGNOSIS — I10 Essential (primary) hypertension: Secondary | ICD-10-CM | POA: Diagnosis not present

## 2015-03-29 DIAGNOSIS — M810 Age-related osteoporosis without current pathological fracture: Secondary | ICD-10-CM | POA: Diagnosis not present

## 2015-03-29 DIAGNOSIS — I69320 Aphasia following cerebral infarction: Secondary | ICD-10-CM | POA: Diagnosis not present

## 2015-03-29 DIAGNOSIS — I69351 Hemiplegia and hemiparesis following cerebral infarction affecting right dominant side: Secondary | ICD-10-CM | POA: Diagnosis not present

## 2015-03-29 DIAGNOSIS — I4891 Unspecified atrial fibrillation: Secondary | ICD-10-CM | POA: Diagnosis not present

## 2015-03-30 ENCOUNTER — Other Ambulatory Visit: Payer: Self-pay | Admitting: *Deleted

## 2015-03-30 DIAGNOSIS — I69320 Aphasia following cerebral infarction: Secondary | ICD-10-CM | POA: Diagnosis not present

## 2015-03-30 DIAGNOSIS — M810 Age-related osteoporosis without current pathological fracture: Secondary | ICD-10-CM | POA: Diagnosis not present

## 2015-03-30 DIAGNOSIS — I4891 Unspecified atrial fibrillation: Secondary | ICD-10-CM | POA: Diagnosis not present

## 2015-03-30 DIAGNOSIS — M150 Primary generalized (osteo)arthritis: Secondary | ICD-10-CM | POA: Diagnosis not present

## 2015-03-30 DIAGNOSIS — I10 Essential (primary) hypertension: Secondary | ICD-10-CM | POA: Diagnosis not present

## 2015-03-30 DIAGNOSIS — I69351 Hemiplegia and hemiparesis following cerebral infarction affecting right dominant side: Secondary | ICD-10-CM | POA: Diagnosis not present

## 2015-03-30 MED ORDER — PANTOPRAZOLE SODIUM 40 MG PO PACK: 40.0000 mg | PACK | Freq: Every day | ORAL | Status: DC

## 2015-03-31 DIAGNOSIS — M150 Primary generalized (osteo)arthritis: Secondary | ICD-10-CM | POA: Diagnosis not present

## 2015-03-31 DIAGNOSIS — I4891 Unspecified atrial fibrillation: Secondary | ICD-10-CM | POA: Diagnosis not present

## 2015-03-31 DIAGNOSIS — I10 Essential (primary) hypertension: Secondary | ICD-10-CM | POA: Diagnosis not present

## 2015-03-31 DIAGNOSIS — I69320 Aphasia following cerebral infarction: Secondary | ICD-10-CM | POA: Diagnosis not present

## 2015-03-31 DIAGNOSIS — M810 Age-related osteoporosis without current pathological fracture: Secondary | ICD-10-CM | POA: Diagnosis not present

## 2015-03-31 DIAGNOSIS — I69351 Hemiplegia and hemiparesis following cerebral infarction affecting right dominant side: Secondary | ICD-10-CM | POA: Diagnosis not present

## 2015-04-01 DIAGNOSIS — M810 Age-related osteoporosis without current pathological fracture: Secondary | ICD-10-CM | POA: Diagnosis not present

## 2015-04-01 DIAGNOSIS — I4891 Unspecified atrial fibrillation: Secondary | ICD-10-CM | POA: Diagnosis not present

## 2015-04-01 DIAGNOSIS — I69320 Aphasia following cerebral infarction: Secondary | ICD-10-CM | POA: Diagnosis not present

## 2015-04-01 DIAGNOSIS — I69351 Hemiplegia and hemiparesis following cerebral infarction affecting right dominant side: Secondary | ICD-10-CM | POA: Diagnosis not present

## 2015-04-01 DIAGNOSIS — I10 Essential (primary) hypertension: Secondary | ICD-10-CM | POA: Diagnosis not present

## 2015-04-01 DIAGNOSIS — M150 Primary generalized (osteo)arthritis: Secondary | ICD-10-CM | POA: Diagnosis not present

## 2015-04-04 DIAGNOSIS — I4891 Unspecified atrial fibrillation: Secondary | ICD-10-CM | POA: Diagnosis not present

## 2015-04-04 DIAGNOSIS — M810 Age-related osteoporosis without current pathological fracture: Secondary | ICD-10-CM | POA: Diagnosis not present

## 2015-04-04 DIAGNOSIS — I69320 Aphasia following cerebral infarction: Secondary | ICD-10-CM | POA: Diagnosis not present

## 2015-04-04 DIAGNOSIS — I639 Cerebral infarction, unspecified: Secondary | ICD-10-CM | POA: Diagnosis not present

## 2015-04-04 DIAGNOSIS — M150 Primary generalized (osteo)arthritis: Secondary | ICD-10-CM | POA: Diagnosis not present

## 2015-04-04 DIAGNOSIS — I10 Essential (primary) hypertension: Secondary | ICD-10-CM | POA: Diagnosis not present

## 2015-04-04 DIAGNOSIS — M542 Cervicalgia: Secondary | ICD-10-CM | POA: Diagnosis not present

## 2015-04-04 DIAGNOSIS — R350 Frequency of micturition: Secondary | ICD-10-CM | POA: Diagnosis not present

## 2015-04-04 DIAGNOSIS — I69351 Hemiplegia and hemiparesis following cerebral infarction affecting right dominant side: Secondary | ICD-10-CM | POA: Diagnosis not present

## 2015-04-05 DIAGNOSIS — I10 Essential (primary) hypertension: Secondary | ICD-10-CM | POA: Diagnosis not present

## 2015-04-05 DIAGNOSIS — I69351 Hemiplegia and hemiparesis following cerebral infarction affecting right dominant side: Secondary | ICD-10-CM | POA: Diagnosis not present

## 2015-04-05 DIAGNOSIS — M810 Age-related osteoporosis without current pathological fracture: Secondary | ICD-10-CM | POA: Diagnosis not present

## 2015-04-05 DIAGNOSIS — I69320 Aphasia following cerebral infarction: Secondary | ICD-10-CM | POA: Diagnosis not present

## 2015-04-05 DIAGNOSIS — M150 Primary generalized (osteo)arthritis: Secondary | ICD-10-CM | POA: Diagnosis not present

## 2015-04-05 DIAGNOSIS — I4891 Unspecified atrial fibrillation: Secondary | ICD-10-CM | POA: Diagnosis not present

## 2015-04-06 DIAGNOSIS — R350 Frequency of micturition: Secondary | ICD-10-CM | POA: Diagnosis not present

## 2015-04-06 DIAGNOSIS — M810 Age-related osteoporosis without current pathological fracture: Secondary | ICD-10-CM | POA: Diagnosis not present

## 2015-04-06 DIAGNOSIS — I69320 Aphasia following cerebral infarction: Secondary | ICD-10-CM | POA: Diagnosis not present

## 2015-04-06 DIAGNOSIS — I69351 Hemiplegia and hemiparesis following cerebral infarction affecting right dominant side: Secondary | ICD-10-CM | POA: Diagnosis not present

## 2015-04-06 DIAGNOSIS — E039 Hypothyroidism, unspecified: Secondary | ICD-10-CM | POA: Diagnosis not present

## 2015-04-06 DIAGNOSIS — F329 Major depressive disorder, single episode, unspecified: Secondary | ICD-10-CM | POA: Diagnosis not present

## 2015-04-06 DIAGNOSIS — I10 Essential (primary) hypertension: Secondary | ICD-10-CM | POA: Diagnosis not present

## 2015-04-06 DIAGNOSIS — M150 Primary generalized (osteo)arthritis: Secondary | ICD-10-CM | POA: Diagnosis not present

## 2015-04-06 DIAGNOSIS — I4891 Unspecified atrial fibrillation: Secondary | ICD-10-CM | POA: Diagnosis not present

## 2015-04-07 DIAGNOSIS — I69320 Aphasia following cerebral infarction: Secondary | ICD-10-CM | POA: Diagnosis not present

## 2015-04-07 DIAGNOSIS — I10 Essential (primary) hypertension: Secondary | ICD-10-CM | POA: Diagnosis not present

## 2015-04-07 DIAGNOSIS — I69351 Hemiplegia and hemiparesis following cerebral infarction affecting right dominant side: Secondary | ICD-10-CM | POA: Diagnosis not present

## 2015-04-07 DIAGNOSIS — M810 Age-related osteoporosis without current pathological fracture: Secondary | ICD-10-CM | POA: Diagnosis not present

## 2015-04-07 DIAGNOSIS — I4891 Unspecified atrial fibrillation: Secondary | ICD-10-CM | POA: Diagnosis not present

## 2015-04-07 DIAGNOSIS — M150 Primary generalized (osteo)arthritis: Secondary | ICD-10-CM | POA: Diagnosis not present

## 2015-04-08 DIAGNOSIS — I4891 Unspecified atrial fibrillation: Secondary | ICD-10-CM | POA: Diagnosis not present

## 2015-04-08 DIAGNOSIS — I69320 Aphasia following cerebral infarction: Secondary | ICD-10-CM | POA: Diagnosis not present

## 2015-04-08 DIAGNOSIS — M810 Age-related osteoporosis without current pathological fracture: Secondary | ICD-10-CM | POA: Diagnosis not present

## 2015-04-08 DIAGNOSIS — I10 Essential (primary) hypertension: Secondary | ICD-10-CM | POA: Diagnosis not present

## 2015-04-08 DIAGNOSIS — I69351 Hemiplegia and hemiparesis following cerebral infarction affecting right dominant side: Secondary | ICD-10-CM | POA: Diagnosis not present

## 2015-04-08 DIAGNOSIS — M150 Primary generalized (osteo)arthritis: Secondary | ICD-10-CM | POA: Diagnosis not present

## 2015-04-11 DIAGNOSIS — I69351 Hemiplegia and hemiparesis following cerebral infarction affecting right dominant side: Secondary | ICD-10-CM | POA: Diagnosis not present

## 2015-04-11 DIAGNOSIS — I69320 Aphasia following cerebral infarction: Secondary | ICD-10-CM | POA: Diagnosis not present

## 2015-04-11 DIAGNOSIS — M810 Age-related osteoporosis without current pathological fracture: Secondary | ICD-10-CM | POA: Diagnosis not present

## 2015-04-11 DIAGNOSIS — M150 Primary generalized (osteo)arthritis: Secondary | ICD-10-CM | POA: Diagnosis not present

## 2015-04-11 DIAGNOSIS — I10 Essential (primary) hypertension: Secondary | ICD-10-CM | POA: Diagnosis not present

## 2015-04-11 DIAGNOSIS — I4891 Unspecified atrial fibrillation: Secondary | ICD-10-CM | POA: Diagnosis not present

## 2015-04-12 DIAGNOSIS — M810 Age-related osteoporosis without current pathological fracture: Secondary | ICD-10-CM | POA: Diagnosis not present

## 2015-04-12 DIAGNOSIS — I4891 Unspecified atrial fibrillation: Secondary | ICD-10-CM | POA: Diagnosis not present

## 2015-04-12 DIAGNOSIS — I69351 Hemiplegia and hemiparesis following cerebral infarction affecting right dominant side: Secondary | ICD-10-CM | POA: Diagnosis not present

## 2015-04-12 DIAGNOSIS — I69320 Aphasia following cerebral infarction: Secondary | ICD-10-CM | POA: Diagnosis not present

## 2015-04-12 DIAGNOSIS — I10 Essential (primary) hypertension: Secondary | ICD-10-CM | POA: Diagnosis not present

## 2015-04-12 DIAGNOSIS — M150 Primary generalized (osteo)arthritis: Secondary | ICD-10-CM | POA: Diagnosis not present

## 2015-04-13 DIAGNOSIS — I69320 Aphasia following cerebral infarction: Secondary | ICD-10-CM | POA: Diagnosis not present

## 2015-04-13 DIAGNOSIS — I69351 Hemiplegia and hemiparesis following cerebral infarction affecting right dominant side: Secondary | ICD-10-CM | POA: Diagnosis not present

## 2015-04-13 DIAGNOSIS — M150 Primary generalized (osteo)arthritis: Secondary | ICD-10-CM | POA: Diagnosis not present

## 2015-04-13 DIAGNOSIS — I10 Essential (primary) hypertension: Secondary | ICD-10-CM | POA: Diagnosis not present

## 2015-04-13 DIAGNOSIS — M810 Age-related osteoporosis without current pathological fracture: Secondary | ICD-10-CM | POA: Diagnosis not present

## 2015-04-13 DIAGNOSIS — I4891 Unspecified atrial fibrillation: Secondary | ICD-10-CM | POA: Diagnosis not present

## 2015-04-14 DIAGNOSIS — I480 Paroxysmal atrial fibrillation: Secondary | ICD-10-CM | POA: Diagnosis not present

## 2015-04-14 DIAGNOSIS — E039 Hypothyroidism, unspecified: Secondary | ICD-10-CM | POA: Diagnosis not present

## 2015-04-14 DIAGNOSIS — I63312 Cerebral infarction due to thrombosis of left middle cerebral artery: Secondary | ICD-10-CM | POA: Diagnosis not present

## 2015-04-14 DIAGNOSIS — I69351 Hemiplegia and hemiparesis following cerebral infarction affecting right dominant side: Secondary | ICD-10-CM | POA: Diagnosis not present

## 2015-04-14 DIAGNOSIS — I4891 Unspecified atrial fibrillation: Secondary | ICD-10-CM | POA: Diagnosis not present

## 2015-04-14 DIAGNOSIS — Z7901 Long term (current) use of anticoagulants: Secondary | ICD-10-CM | POA: Diagnosis not present

## 2015-04-14 DIAGNOSIS — M810 Age-related osteoporosis without current pathological fracture: Secondary | ICD-10-CM | POA: Diagnosis not present

## 2015-04-14 DIAGNOSIS — M150 Primary generalized (osteo)arthritis: Secondary | ICD-10-CM | POA: Diagnosis not present

## 2015-04-14 DIAGNOSIS — I69320 Aphasia following cerebral infarction: Secondary | ICD-10-CM | POA: Diagnosis not present

## 2015-04-14 DIAGNOSIS — I10 Essential (primary) hypertension: Secondary | ICD-10-CM | POA: Diagnosis not present

## 2015-04-15 DIAGNOSIS — I69320 Aphasia following cerebral infarction: Secondary | ICD-10-CM | POA: Diagnosis not present

## 2015-04-15 DIAGNOSIS — M150 Primary generalized (osteo)arthritis: Secondary | ICD-10-CM | POA: Diagnosis not present

## 2015-04-15 DIAGNOSIS — M810 Age-related osteoporosis without current pathological fracture: Secondary | ICD-10-CM | POA: Diagnosis not present

## 2015-04-15 DIAGNOSIS — I69351 Hemiplegia and hemiparesis following cerebral infarction affecting right dominant side: Secondary | ICD-10-CM | POA: Diagnosis not present

## 2015-04-15 DIAGNOSIS — I4891 Unspecified atrial fibrillation: Secondary | ICD-10-CM | POA: Diagnosis not present

## 2015-04-15 DIAGNOSIS — I10 Essential (primary) hypertension: Secondary | ICD-10-CM | POA: Diagnosis not present

## 2015-04-18 DIAGNOSIS — M150 Primary generalized (osteo)arthritis: Secondary | ICD-10-CM | POA: Diagnosis not present

## 2015-04-18 DIAGNOSIS — M810 Age-related osteoporosis without current pathological fracture: Secondary | ICD-10-CM | POA: Diagnosis not present

## 2015-04-18 DIAGNOSIS — I69320 Aphasia following cerebral infarction: Secondary | ICD-10-CM | POA: Diagnosis not present

## 2015-04-18 DIAGNOSIS — I69351 Hemiplegia and hemiparesis following cerebral infarction affecting right dominant side: Secondary | ICD-10-CM | POA: Diagnosis not present

## 2015-04-18 DIAGNOSIS — I10 Essential (primary) hypertension: Secondary | ICD-10-CM | POA: Diagnosis not present

## 2015-04-18 DIAGNOSIS — I4891 Unspecified atrial fibrillation: Secondary | ICD-10-CM | POA: Diagnosis not present

## 2015-04-19 DIAGNOSIS — I69320 Aphasia following cerebral infarction: Secondary | ICD-10-CM | POA: Diagnosis not present

## 2015-04-19 DIAGNOSIS — M150 Primary generalized (osteo)arthritis: Secondary | ICD-10-CM | POA: Diagnosis not present

## 2015-04-19 DIAGNOSIS — M810 Age-related osteoporosis without current pathological fracture: Secondary | ICD-10-CM | POA: Diagnosis not present

## 2015-04-19 DIAGNOSIS — I69351 Hemiplegia and hemiparesis following cerebral infarction affecting right dominant side: Secondary | ICD-10-CM | POA: Diagnosis not present

## 2015-04-19 DIAGNOSIS — R350 Frequency of micturition: Secondary | ICD-10-CM | POA: Diagnosis not present

## 2015-04-19 DIAGNOSIS — R351 Nocturia: Secondary | ICD-10-CM | POA: Diagnosis not present

## 2015-04-19 DIAGNOSIS — I4891 Unspecified atrial fibrillation: Secondary | ICD-10-CM | POA: Diagnosis not present

## 2015-04-19 DIAGNOSIS — I10 Essential (primary) hypertension: Secondary | ICD-10-CM | POA: Diagnosis not present

## 2015-04-19 DIAGNOSIS — N390 Urinary tract infection, site not specified: Secondary | ICD-10-CM | POA: Diagnosis not present

## 2015-04-20 DIAGNOSIS — I4891 Unspecified atrial fibrillation: Secondary | ICD-10-CM | POA: Diagnosis not present

## 2015-04-20 DIAGNOSIS — I69351 Hemiplegia and hemiparesis following cerebral infarction affecting right dominant side: Secondary | ICD-10-CM | POA: Diagnosis not present

## 2015-04-20 DIAGNOSIS — I69320 Aphasia following cerebral infarction: Secondary | ICD-10-CM | POA: Diagnosis not present

## 2015-04-20 DIAGNOSIS — I10 Essential (primary) hypertension: Secondary | ICD-10-CM | POA: Diagnosis not present

## 2015-04-20 DIAGNOSIS — M810 Age-related osteoporosis without current pathological fracture: Secondary | ICD-10-CM | POA: Diagnosis not present

## 2015-04-20 DIAGNOSIS — M150 Primary generalized (osteo)arthritis: Secondary | ICD-10-CM | POA: Diagnosis not present

## 2015-04-21 DIAGNOSIS — I4891 Unspecified atrial fibrillation: Secondary | ICD-10-CM | POA: Diagnosis not present

## 2015-04-21 DIAGNOSIS — I10 Essential (primary) hypertension: Secondary | ICD-10-CM | POA: Diagnosis not present

## 2015-04-21 DIAGNOSIS — M150 Primary generalized (osteo)arthritis: Secondary | ICD-10-CM | POA: Diagnosis not present

## 2015-04-21 DIAGNOSIS — I69351 Hemiplegia and hemiparesis following cerebral infarction affecting right dominant side: Secondary | ICD-10-CM | POA: Diagnosis not present

## 2015-04-21 DIAGNOSIS — I69320 Aphasia following cerebral infarction: Secondary | ICD-10-CM | POA: Diagnosis not present

## 2015-04-21 DIAGNOSIS — M810 Age-related osteoporosis without current pathological fracture: Secondary | ICD-10-CM | POA: Diagnosis not present

## 2015-04-22 DIAGNOSIS — M150 Primary generalized (osteo)arthritis: Secondary | ICD-10-CM | POA: Diagnosis not present

## 2015-04-22 DIAGNOSIS — I69351 Hemiplegia and hemiparesis following cerebral infarction affecting right dominant side: Secondary | ICD-10-CM | POA: Diagnosis not present

## 2015-04-22 DIAGNOSIS — I4891 Unspecified atrial fibrillation: Secondary | ICD-10-CM | POA: Diagnosis not present

## 2015-04-22 DIAGNOSIS — I69320 Aphasia following cerebral infarction: Secondary | ICD-10-CM | POA: Diagnosis not present

## 2015-04-22 DIAGNOSIS — M810 Age-related osteoporosis without current pathological fracture: Secondary | ICD-10-CM | POA: Diagnosis not present

## 2015-04-22 DIAGNOSIS — I10 Essential (primary) hypertension: Secondary | ICD-10-CM | POA: Diagnosis not present

## 2015-04-23 DIAGNOSIS — D649 Anemia, unspecified: Secondary | ICD-10-CM | POA: Diagnosis not present

## 2015-04-23 DIAGNOSIS — I4891 Unspecified atrial fibrillation: Secondary | ICD-10-CM | POA: Diagnosis not present

## 2015-04-23 DIAGNOSIS — I69320 Aphasia following cerebral infarction: Secondary | ICD-10-CM | POA: Diagnosis not present

## 2015-04-23 DIAGNOSIS — Z9181 History of falling: Secondary | ICD-10-CM | POA: Diagnosis not present

## 2015-04-23 DIAGNOSIS — I69351 Hemiplegia and hemiparesis following cerebral infarction affecting right dominant side: Secondary | ICD-10-CM | POA: Diagnosis not present

## 2015-04-23 DIAGNOSIS — Z87440 Personal history of urinary (tract) infections: Secondary | ICD-10-CM | POA: Diagnosis not present

## 2015-04-23 DIAGNOSIS — M150 Primary generalized (osteo)arthritis: Secondary | ICD-10-CM | POA: Diagnosis not present

## 2015-04-23 DIAGNOSIS — M810 Age-related osteoporosis without current pathological fracture: Secondary | ICD-10-CM | POA: Diagnosis not present

## 2015-04-23 DIAGNOSIS — Z7901 Long term (current) use of anticoagulants: Secondary | ICD-10-CM | POA: Diagnosis not present

## 2015-04-23 DIAGNOSIS — I10 Essential (primary) hypertension: Secondary | ICD-10-CM | POA: Diagnosis not present

## 2015-04-25 DIAGNOSIS — I69351 Hemiplegia and hemiparesis following cerebral infarction affecting right dominant side: Secondary | ICD-10-CM | POA: Diagnosis not present

## 2015-04-25 DIAGNOSIS — I69320 Aphasia following cerebral infarction: Secondary | ICD-10-CM | POA: Diagnosis not present

## 2015-04-25 DIAGNOSIS — M150 Primary generalized (osteo)arthritis: Secondary | ICD-10-CM | POA: Diagnosis not present

## 2015-04-25 DIAGNOSIS — I10 Essential (primary) hypertension: Secondary | ICD-10-CM | POA: Diagnosis not present

## 2015-04-25 DIAGNOSIS — M810 Age-related osteoporosis without current pathological fracture: Secondary | ICD-10-CM | POA: Diagnosis not present

## 2015-04-25 DIAGNOSIS — I4891 Unspecified atrial fibrillation: Secondary | ICD-10-CM | POA: Diagnosis not present

## 2015-04-26 DIAGNOSIS — Z79899 Other long term (current) drug therapy: Secondary | ICD-10-CM | POA: Diagnosis not present

## 2015-04-26 DIAGNOSIS — G4709 Other insomnia: Secondary | ICD-10-CM | POA: Diagnosis not present

## 2015-04-26 DIAGNOSIS — R350 Frequency of micturition: Secondary | ICD-10-CM | POA: Diagnosis not present

## 2015-04-27 DIAGNOSIS — M150 Primary generalized (osteo)arthritis: Secondary | ICD-10-CM | POA: Diagnosis not present

## 2015-04-27 DIAGNOSIS — M810 Age-related osteoporosis without current pathological fracture: Secondary | ICD-10-CM | POA: Diagnosis not present

## 2015-04-27 DIAGNOSIS — I69351 Hemiplegia and hemiparesis following cerebral infarction affecting right dominant side: Secondary | ICD-10-CM | POA: Diagnosis not present

## 2015-04-27 DIAGNOSIS — I10 Essential (primary) hypertension: Secondary | ICD-10-CM | POA: Diagnosis not present

## 2015-04-27 DIAGNOSIS — I4891 Unspecified atrial fibrillation: Secondary | ICD-10-CM | POA: Diagnosis not present

## 2015-04-27 DIAGNOSIS — I69320 Aphasia following cerebral infarction: Secondary | ICD-10-CM | POA: Diagnosis not present

## 2015-04-28 DIAGNOSIS — I69351 Hemiplegia and hemiparesis following cerebral infarction affecting right dominant side: Secondary | ICD-10-CM | POA: Diagnosis not present

## 2015-04-28 DIAGNOSIS — M150 Primary generalized (osteo)arthritis: Secondary | ICD-10-CM | POA: Diagnosis not present

## 2015-04-28 DIAGNOSIS — I69320 Aphasia following cerebral infarction: Secondary | ICD-10-CM | POA: Diagnosis not present

## 2015-04-28 DIAGNOSIS — I4891 Unspecified atrial fibrillation: Secondary | ICD-10-CM | POA: Diagnosis not present

## 2015-04-28 DIAGNOSIS — M810 Age-related osteoporosis without current pathological fracture: Secondary | ICD-10-CM | POA: Diagnosis not present

## 2015-04-28 DIAGNOSIS — I10 Essential (primary) hypertension: Secondary | ICD-10-CM | POA: Diagnosis not present

## 2015-04-29 DIAGNOSIS — M810 Age-related osteoporosis without current pathological fracture: Secondary | ICD-10-CM | POA: Diagnosis not present

## 2015-04-29 DIAGNOSIS — I10 Essential (primary) hypertension: Secondary | ICD-10-CM | POA: Diagnosis not present

## 2015-04-29 DIAGNOSIS — I69320 Aphasia following cerebral infarction: Secondary | ICD-10-CM | POA: Diagnosis not present

## 2015-04-29 DIAGNOSIS — I69351 Hemiplegia and hemiparesis following cerebral infarction affecting right dominant side: Secondary | ICD-10-CM | POA: Diagnosis not present

## 2015-04-29 DIAGNOSIS — M150 Primary generalized (osteo)arthritis: Secondary | ICD-10-CM | POA: Diagnosis not present

## 2015-04-29 DIAGNOSIS — I4891 Unspecified atrial fibrillation: Secondary | ICD-10-CM | POA: Diagnosis not present

## 2015-05-02 ENCOUNTER — Ambulatory Visit (HOSPITAL_BASED_OUTPATIENT_CLINIC_OR_DEPARTMENT_OTHER): Payer: Medicare Other | Admitting: Physical Medicine & Rehabilitation

## 2015-05-02 ENCOUNTER — Encounter: Payer: Self-pay | Admitting: Physical Medicine & Rehabilitation

## 2015-05-02 ENCOUNTER — Encounter: Payer: Medicare Other | Attending: Physical Medicine & Rehabilitation

## 2015-05-02 VITALS — BP 100/61 | HR 78 | Resp 14

## 2015-05-02 DIAGNOSIS — G8929 Other chronic pain: Secondary | ICD-10-CM | POA: Diagnosis present

## 2015-05-02 DIAGNOSIS — I69351 Hemiplegia and hemiparesis following cerebral infarction affecting right dominant side: Secondary | ICD-10-CM | POA: Diagnosis not present

## 2015-05-02 DIAGNOSIS — M150 Primary generalized (osteo)arthritis: Secondary | ICD-10-CM | POA: Diagnosis not present

## 2015-05-02 DIAGNOSIS — R482 Apraxia: Secondary | ICD-10-CM

## 2015-05-02 DIAGNOSIS — I69920 Aphasia following unspecified cerebrovascular disease: Secondary | ICD-10-CM

## 2015-05-02 DIAGNOSIS — I69820 Aphasia following other cerebrovascular disease: Secondary | ICD-10-CM | POA: Insufficient documentation

## 2015-05-02 DIAGNOSIS — I69320 Aphasia following cerebral infarction: Secondary | ICD-10-CM | POA: Diagnosis not present

## 2015-05-02 DIAGNOSIS — IMO0002 Reserved for concepts with insufficient information to code with codable children: Secondary | ICD-10-CM

## 2015-05-02 DIAGNOSIS — I10 Essential (primary) hypertension: Secondary | ICD-10-CM | POA: Diagnosis not present

## 2015-05-02 DIAGNOSIS — I69851 Hemiplegia and hemiparesis following other cerebrovascular disease affecting right dominant side: Secondary | ICD-10-CM | POA: Insufficient documentation

## 2015-05-02 DIAGNOSIS — I639 Cerebral infarction, unspecified: Secondary | ICD-10-CM

## 2015-05-02 DIAGNOSIS — M542 Cervicalgia: Secondary | ICD-10-CM | POA: Diagnosis present

## 2015-05-02 DIAGNOSIS — M810 Age-related osteoporosis without current pathological fracture: Secondary | ICD-10-CM | POA: Diagnosis not present

## 2015-05-02 DIAGNOSIS — I4891 Unspecified atrial fibrillation: Secondary | ICD-10-CM | POA: Diagnosis not present

## 2015-05-02 NOTE — Progress Notes (Signed)
Subjective:    Patient ID: Linda [name], female    DOB: March 11, 1929, 81 y.o.   MRN: [id number] 81 y.o. female with history of RLS, chronic neck pain, A fib,  mild cognitive disorder, L-MCA infarct 12/2014 with mild residual HP and expressive aphasia and recurrent occipital stroke 02/18/2015 in setting of A fib and lack of anticoagulation.   She was readmitted on 03/14/15 with malaise and refusal to get out of bed as well as confusion. She was started on IV rocephin for recurrent UTI and has had improvement in mentation. X rays of lumbar and thoracic spine with diffuse degenerative changes. robaxin was scheduled to help with neck/back spasms Admit date: 03/17/2015 Discharge date: 03/25/2015  HPI Patient is at her son's home with 24-hour care. She requires assistance for dressing and bathing. Receiving home health PT OT and speech therapy. No falls Patient has been ambulating short distances with a walker. Daughter-in-law has noticed swelling in the second and third digits of the right hand. Patient denies any pain in the fingers of the right hand. No trauma to that area reported.  Pain Inventory Average Pain 0 Pain Right Now 0 My pain is no pain  In the last 24 hours, has pain interfered with the following? General activity 0 Relation with others 0 Enjoyment of life 0 What TIME of day is your pain at its worst? no pain Sleep (in general) Poor  Pain is worse with: no pain Pain improves with: no pain Relief from Meds: no pain  Mobility walk with assistance use a walker ability to climb steps?  yes do you drive?  no use a wheelchair needs help with transfers  Function not employed: date last employed . disabled: date disabled . I need assistance with the following:  feeding, dressing, bathing, toileting, meal prep, household duties and shopping  Neuro/Psych weakness trouble walking  Prior Studies hospital f/u  Physicians involved in your care hospital f/u   Family  History  Problem Relation Age of Onset  . Asthma Brother   . Osteoarthritis Son   . Hypertension Father   . Stroke Father   . Cancer Brother     stomach   Social History   Social History  . Marital Status: Widowed    Spouse Name: N/A  . Number of Children: 3  . Years of Education: some coll.   Occupational History  . retired     Primary school teacher position   Social History Main Topics  . Smoking status: Never Smoker   . Smokeless tobacco: Never Used  . Alcohol Use: No  . Drug Use: No  . Sexual Activity: No   Other Topics Concern  . None   Social History Narrative   Patient is widowed with 3 children.   Patient is right handed.   Patient has some college education.   Patient drinks 1-2 cups some days of the week.   Past Surgical History  Procedure Laterality Date  . Back surgery      x 3  . Breast surgery Bilateral     1 lump each breast removed and benign  . Nasal sinus surgery    . Dilation and curettage of uterus  1970  . Catheterization  2003    cardiac cath  . Kyphoplasty  2010  . Cataract extraction Bilateral 2011  . Cholecystectomy  1995    Lap  . Cardiac catheterization  2002    "it was normal" per pt - no record found  .  Robotic assisted salpingo oopherectomy Bilateral 11/02/2014    Procedure: ROBOTIC ASSISTED bilateral SALPINGO OOPHORECTOMY;  Surgeon: Everitt Amber, MD;  Location: [facility];  Service: Gynecology;  Laterality: Bilateral;  . Laparoscopy N/A 11/02/2014    Procedure: LAPAROSCOPY DIAGNOSTIC;  Surgeon: Everitt Amber, MD;  Location: [facility];  Service: Gynecology;  Laterality: N/A;  . Abdominal hysterectomy     Past Medical History  Diagnosis Date  . History of cardiac catheterization 2002    Negative  . Vertebral fracture   . Restless legs syndrome with nocturnal myoclonus 05/13/2013     requip controlled   . Abnormal uterine bleeding 9924,2683  . Hypothyroidism   . Arthritis   . Osteoporosis   . History of stomach ulcers   . History of  jaundice as a child   . Anemia   . Head pain, chronic     "I have pain in the back of my head for months"  . Dizziness     TAKES MECLIZINE TO TREAT  . Hemiparesis and speech and language deficit as late effects of stroke 02/09/2015   BP 100/61 mmHg  Pulse 78  Resp 14  SpO2 98%  LMP 09/03/1996 (Approximate)  Opioid Risk Score:   Fall Risk Score:  `1  Depression screen PHQ 2/9  Depression screen PHQ 2/9 05/02/2015  Decreased Interest 0  Down, Depressed, Hopeless 0  PHQ - 2 Score 0  Altered sleeping 3  Tired, decreased energy 0  Change in appetite 3  Feeling bad or failure about yourself  0  Trouble concentrating 0  Moving slowly or fidgety/restless 3  Suicidal thoughts 0  PHQ-9 Score 9  Difficult doing work/chores Very difficult     Review of Systems  Constitutional:       Weight loss  Gastrointestinal: Positive for diarrhea.  Musculoskeletal: Positive for joint swelling and gait problem.  Neurological: Positive for weakness.  All other systems reviewed and are negative.      Objective:   Physical Exam  Constitutional: She is oriented to person, place, and time. She appears well-developed and well-nourished.  HENT:  Head: Normocephalic and atraumatic.  Eyes: Conjunctivae and EOM are normal. Pupils are equal, round, and reactive to light.  Neck: Normal range of motion.  Cardiovascular: Normal rate, regular rhythm and normal heart sounds.   Pulmonary/Chest: Effort normal and breath sounds normal.  Abdominal: Soft. Bowel sounds are normal.  Neurological: She is alert and oriented to person, place, and time. She has normal reflexes.  Difficult to assess sensation secondary to aphasia  Psychiatric: She has a normal mood and affect.  Nursing note and vitals reviewed.   Joint swelling right MCP 2 through 45, PIP 2 through 5 DIP 2 through 5. Mild dorsum of the hand edema. No erythema. No pain to palpation, no pain with range of motion. Motor strength is 4 minus/5  bilateral deltoid, biceps, triceps, grip, hip flexor, knee extensor, ankle dorsiflexor and plantar flexor  Speech is a phasic she has difficulty with naming. She has some difficulty with generating speech beyond single words. Comprehension appears intact      Assessment & Plan:   1. History of left middle cerebral artery infarct causing aphasia. Right-sided weakness has improved, She still has severe aphasia. I discussed with the patient as well as her daughter-in-law the usual time course for recovery. For physical functioning the plateau usually his reach 3-6 months post stroke. Speech and language as well as cognition tend to progress more slowly and apply toe  may be expected from 9-12 months post stroke. Recommend continued home health therapy with PT OT and speech. Once the patient completes home health therapy she may benefit from outpatient speech therapy but likely will not need any outpatient PT OT  PCP follow-up Rehabilitation follow-up when necessary  Discussed my findings with the patient as well as her daughter-in-law who is in the room with her. I also wrote down our discussion so she could share with her husband and brother-in-law  Over half of the 25 min visit was spent counseling and coordinating care.

## 2015-05-02 NOTE — Patient Instructions (Signed)
We discussed the usual time course recovery after stroke. Usually physical recovery plateaus in 3-6 months. The recovery of a aphasia which is the speech problem after stroke may take 9-12 months to plateau. I recommend continued speech therapy  In regards to hand it appears to be mainly osteoarthritis combined with some fluid due to weakness of the hand resulting from the stroke. Elevation when she is sitting is helpful. Massage from the fingers to the wrist can be helpful Also would encourage using the hand is much as possible since muscular activity can also help reduce swelling.

## 2015-05-03 DIAGNOSIS — M150 Primary generalized (osteo)arthritis: Secondary | ICD-10-CM | POA: Diagnosis not present

## 2015-05-03 DIAGNOSIS — M810 Age-related osteoporosis without current pathological fracture: Secondary | ICD-10-CM | POA: Diagnosis not present

## 2015-05-03 DIAGNOSIS — I69351 Hemiplegia and hemiparesis following cerebral infarction affecting right dominant side: Secondary | ICD-10-CM | POA: Diagnosis not present

## 2015-05-03 DIAGNOSIS — I4891 Unspecified atrial fibrillation: Secondary | ICD-10-CM | POA: Diagnosis not present

## 2015-05-03 DIAGNOSIS — I69320 Aphasia following cerebral infarction: Secondary | ICD-10-CM | POA: Diagnosis not present

## 2015-05-03 DIAGNOSIS — I10 Essential (primary) hypertension: Secondary | ICD-10-CM | POA: Diagnosis not present

## 2015-05-04 DIAGNOSIS — I10 Essential (primary) hypertension: Secondary | ICD-10-CM | POA: Diagnosis not present

## 2015-05-04 DIAGNOSIS — I69320 Aphasia following cerebral infarction: Secondary | ICD-10-CM | POA: Diagnosis not present

## 2015-05-04 DIAGNOSIS — M810 Age-related osteoporosis without current pathological fracture: Secondary | ICD-10-CM | POA: Diagnosis not present

## 2015-05-04 DIAGNOSIS — M150 Primary generalized (osteo)arthritis: Secondary | ICD-10-CM | POA: Diagnosis not present

## 2015-05-04 DIAGNOSIS — I4891 Unspecified atrial fibrillation: Secondary | ICD-10-CM | POA: Diagnosis not present

## 2015-05-04 DIAGNOSIS — I69351 Hemiplegia and hemiparesis following cerebral infarction affecting right dominant side: Secondary | ICD-10-CM | POA: Diagnosis not present

## 2015-05-05 DIAGNOSIS — M150 Primary generalized (osteo)arthritis: Secondary | ICD-10-CM | POA: Diagnosis not present

## 2015-05-05 DIAGNOSIS — L8990 Pressure ulcer of unspecified site, unspecified stage: Secondary | ICD-10-CM | POA: Diagnosis not present

## 2015-05-05 DIAGNOSIS — I69351 Hemiplegia and hemiparesis following cerebral infarction affecting right dominant side: Secondary | ICD-10-CM | POA: Diagnosis not present

## 2015-05-05 DIAGNOSIS — I69320 Aphasia following cerebral infarction: Secondary | ICD-10-CM | POA: Diagnosis not present

## 2015-05-05 DIAGNOSIS — I4891 Unspecified atrial fibrillation: Secondary | ICD-10-CM | POA: Diagnosis not present

## 2015-05-05 DIAGNOSIS — I10 Essential (primary) hypertension: Secondary | ICD-10-CM | POA: Diagnosis not present

## 2015-05-05 DIAGNOSIS — M810 Age-related osteoporosis without current pathological fracture: Secondary | ICD-10-CM | POA: Diagnosis not present

## 2015-05-06 DIAGNOSIS — M810 Age-related osteoporosis without current pathological fracture: Secondary | ICD-10-CM | POA: Diagnosis not present

## 2015-05-06 DIAGNOSIS — I4891 Unspecified atrial fibrillation: Secondary | ICD-10-CM | POA: Diagnosis not present

## 2015-05-06 DIAGNOSIS — I69351 Hemiplegia and hemiparesis following cerebral infarction affecting right dominant side: Secondary | ICD-10-CM | POA: Diagnosis not present

## 2015-05-06 DIAGNOSIS — I10 Essential (primary) hypertension: Secondary | ICD-10-CM | POA: Diagnosis not present

## 2015-05-06 DIAGNOSIS — M150 Primary generalized (osteo)arthritis: Secondary | ICD-10-CM | POA: Diagnosis not present

## 2015-05-06 DIAGNOSIS — I69320 Aphasia following cerebral infarction: Secondary | ICD-10-CM | POA: Diagnosis not present

## 2015-05-10 DIAGNOSIS — I4891 Unspecified atrial fibrillation: Secondary | ICD-10-CM | POA: Diagnosis not present

## 2015-05-10 DIAGNOSIS — M150 Primary generalized (osteo)arthritis: Secondary | ICD-10-CM | POA: Diagnosis not present

## 2015-05-10 DIAGNOSIS — F329 Major depressive disorder, single episode, unspecified: Secondary | ICD-10-CM | POA: Diagnosis not present

## 2015-05-10 DIAGNOSIS — I69320 Aphasia following cerebral infarction: Secondary | ICD-10-CM | POA: Diagnosis not present

## 2015-05-10 DIAGNOSIS — I10 Essential (primary) hypertension: Secondary | ICD-10-CM | POA: Diagnosis not present

## 2015-05-10 DIAGNOSIS — M810 Age-related osteoporosis without current pathological fracture: Secondary | ICD-10-CM | POA: Diagnosis not present

## 2015-05-10 DIAGNOSIS — R350 Frequency of micturition: Secondary | ICD-10-CM | POA: Diagnosis not present

## 2015-05-10 DIAGNOSIS — I69351 Hemiplegia and hemiparesis following cerebral infarction affecting right dominant side: Secondary | ICD-10-CM | POA: Diagnosis not present

## 2015-05-11 DIAGNOSIS — M810 Age-related osteoporosis without current pathological fracture: Secondary | ICD-10-CM | POA: Diagnosis not present

## 2015-05-11 DIAGNOSIS — I4891 Unspecified atrial fibrillation: Secondary | ICD-10-CM | POA: Diagnosis not present

## 2015-05-11 DIAGNOSIS — I10 Essential (primary) hypertension: Secondary | ICD-10-CM | POA: Diagnosis not present

## 2015-05-11 DIAGNOSIS — I69351 Hemiplegia and hemiparesis following cerebral infarction affecting right dominant side: Secondary | ICD-10-CM | POA: Diagnosis not present

## 2015-05-11 DIAGNOSIS — M150 Primary generalized (osteo)arthritis: Secondary | ICD-10-CM | POA: Diagnosis not present

## 2015-05-11 DIAGNOSIS — I69320 Aphasia following cerebral infarction: Secondary | ICD-10-CM | POA: Diagnosis not present

## 2015-05-12 DIAGNOSIS — I10 Essential (primary) hypertension: Secondary | ICD-10-CM | POA: Diagnosis not present

## 2015-05-12 DIAGNOSIS — I69320 Aphasia following cerebral infarction: Secondary | ICD-10-CM | POA: Diagnosis not present

## 2015-05-12 DIAGNOSIS — I4891 Unspecified atrial fibrillation: Secondary | ICD-10-CM | POA: Diagnosis not present

## 2015-05-12 DIAGNOSIS — M810 Age-related osteoporosis without current pathological fracture: Secondary | ICD-10-CM | POA: Diagnosis not present

## 2015-05-12 DIAGNOSIS — M150 Primary generalized (osteo)arthritis: Secondary | ICD-10-CM | POA: Diagnosis not present

## 2015-05-12 DIAGNOSIS — I69351 Hemiplegia and hemiparesis following cerebral infarction affecting right dominant side: Secondary | ICD-10-CM | POA: Diagnosis not present

## 2015-05-13 DIAGNOSIS — M810 Age-related osteoporosis without current pathological fracture: Secondary | ICD-10-CM | POA: Diagnosis not present

## 2015-05-13 DIAGNOSIS — M150 Primary generalized (osteo)arthritis: Secondary | ICD-10-CM | POA: Diagnosis not present

## 2015-05-13 DIAGNOSIS — I10 Essential (primary) hypertension: Secondary | ICD-10-CM | POA: Diagnosis not present

## 2015-05-13 DIAGNOSIS — I69320 Aphasia following cerebral infarction: Secondary | ICD-10-CM | POA: Diagnosis not present

## 2015-05-13 DIAGNOSIS — I4891 Unspecified atrial fibrillation: Secondary | ICD-10-CM | POA: Diagnosis not present

## 2015-05-13 DIAGNOSIS — I69351 Hemiplegia and hemiparesis following cerebral infarction affecting right dominant side: Secondary | ICD-10-CM | POA: Diagnosis not present

## 2015-05-16 DIAGNOSIS — I69320 Aphasia following cerebral infarction: Secondary | ICD-10-CM | POA: Diagnosis not present

## 2015-05-16 DIAGNOSIS — I4891 Unspecified atrial fibrillation: Secondary | ICD-10-CM | POA: Diagnosis not present

## 2015-05-16 DIAGNOSIS — I69351 Hemiplegia and hemiparesis following cerebral infarction affecting right dominant side: Secondary | ICD-10-CM | POA: Diagnosis not present

## 2015-05-16 DIAGNOSIS — M810 Age-related osteoporosis without current pathological fracture: Secondary | ICD-10-CM | POA: Diagnosis not present

## 2015-05-16 DIAGNOSIS — M150 Primary generalized (osteo)arthritis: Secondary | ICD-10-CM | POA: Diagnosis not present

## 2015-05-16 DIAGNOSIS — I10 Essential (primary) hypertension: Secondary | ICD-10-CM | POA: Diagnosis not present

## 2015-05-17 DIAGNOSIS — I69351 Hemiplegia and hemiparesis following cerebral infarction affecting right dominant side: Secondary | ICD-10-CM | POA: Diagnosis not present

## 2015-05-17 DIAGNOSIS — I4891 Unspecified atrial fibrillation: Secondary | ICD-10-CM | POA: Diagnosis not present

## 2015-05-17 DIAGNOSIS — M810 Age-related osteoporosis without current pathological fracture: Secondary | ICD-10-CM | POA: Diagnosis not present

## 2015-05-17 DIAGNOSIS — I10 Essential (primary) hypertension: Secondary | ICD-10-CM | POA: Diagnosis not present

## 2015-05-17 DIAGNOSIS — M150 Primary generalized (osteo)arthritis: Secondary | ICD-10-CM | POA: Diagnosis not present

## 2015-05-17 DIAGNOSIS — I69320 Aphasia following cerebral infarction: Secondary | ICD-10-CM | POA: Diagnosis not present

## 2015-05-19 DIAGNOSIS — I69320 Aphasia following cerebral infarction: Secondary | ICD-10-CM | POA: Diagnosis not present

## 2015-05-19 DIAGNOSIS — I10 Essential (primary) hypertension: Secondary | ICD-10-CM | POA: Diagnosis not present

## 2015-05-19 DIAGNOSIS — M150 Primary generalized (osteo)arthritis: Secondary | ICD-10-CM | POA: Diagnosis not present

## 2015-05-19 DIAGNOSIS — M810 Age-related osteoporosis without current pathological fracture: Secondary | ICD-10-CM | POA: Diagnosis not present

## 2015-05-19 DIAGNOSIS — I4891 Unspecified atrial fibrillation: Secondary | ICD-10-CM | POA: Diagnosis not present

## 2015-05-19 DIAGNOSIS — I69351 Hemiplegia and hemiparesis following cerebral infarction affecting right dominant side: Secondary | ICD-10-CM | POA: Diagnosis not present

## 2015-05-20 ENCOUNTER — Encounter (HOSPITAL_COMMUNITY): Payer: Self-pay | Admitting: *Deleted

## 2015-05-20 ENCOUNTER — Ambulatory Visit (INDEPENDENT_AMBULATORY_CARE_PROVIDER_SITE_OTHER): Payer: Medicare Other | Admitting: Family Medicine

## 2015-05-20 ENCOUNTER — Emergency Department (HOSPITAL_COMMUNITY): Payer: Medicare Other

## 2015-05-20 ENCOUNTER — Emergency Department (HOSPITAL_COMMUNITY)
Admission: EM | Admit: 2015-05-20 | Discharge: 2015-05-20 | Disposition: A | Discharge status: Home / Self Care | Payer: Medicare Other | Source: Home / Self Care | Attending: Emergency Medicine | Admitting: Emergency Medicine

## 2015-05-20 VITALS — BP 110/70 | HR 59 | Temp 97.8°F

## 2015-05-20 DIAGNOSIS — I69320 Aphasia following cerebral infarction: Secondary | ICD-10-CM | POA: Diagnosis not present

## 2015-05-20 DIAGNOSIS — M150 Primary generalized (osteo)arthritis: Secondary | ICD-10-CM | POA: Diagnosis not present

## 2015-05-20 DIAGNOSIS — S72001A Fracture of unspecified part of neck of right femur, initial encounter for closed fracture: Secondary | ICD-10-CM | POA: Diagnosis not present

## 2015-05-20 DIAGNOSIS — R404 Transient alteration of awareness: Secondary | ICD-10-CM | POA: Diagnosis not present

## 2015-05-20 DIAGNOSIS — E876 Hypokalemia: Secondary | ICD-10-CM | POA: Diagnosis not present

## 2015-05-20 DIAGNOSIS — Z8673 Personal history of transient ischemic attack (TIA), and cerebral infarction without residual deficits: Secondary | ICD-10-CM | POA: Diagnosis not present

## 2015-05-20 DIAGNOSIS — R269 Unspecified abnormalities of gait and mobility: Secondary | ICD-10-CM | POA: Diagnosis not present

## 2015-05-20 DIAGNOSIS — I69351 Hemiplegia and hemiparesis following cerebral infarction affecting right dominant side: Secondary | ICD-10-CM | POA: Diagnosis not present

## 2015-05-20 DIAGNOSIS — I10 Essential (primary) hypertension: Secondary | ICD-10-CM | POA: Diagnosis not present

## 2015-05-20 DIAGNOSIS — G8929 Other chronic pain: Secondary | ICD-10-CM | POA: Insufficient documentation

## 2015-05-20 DIAGNOSIS — Z79899 Other long term (current) drug therapy: Secondary | ICD-10-CM | POA: Insufficient documentation

## 2015-05-20 DIAGNOSIS — M1990 Unspecified osteoarthritis, unspecified site: Secondary | ICD-10-CM | POA: Insufficient documentation

## 2015-05-20 DIAGNOSIS — M6289 Other specified disorders of muscle: Secondary | ICD-10-CM | POA: Diagnosis not present

## 2015-05-20 DIAGNOSIS — I639 Cerebral infarction, unspecified: Secondary | ICD-10-CM | POA: Diagnosis not present

## 2015-05-20 DIAGNOSIS — R531 Weakness: Secondary | ICD-10-CM

## 2015-05-20 DIAGNOSIS — Z8781 Personal history of (healed) traumatic fracture: Secondary | ICD-10-CM | POA: Insufficient documentation

## 2015-05-20 DIAGNOSIS — F0390 Unspecified dementia without behavioral disturbance: Secondary | ICD-10-CM | POA: Diagnosis not present

## 2015-05-20 DIAGNOSIS — G2581 Restless legs syndrome: Secondary | ICD-10-CM | POA: Diagnosis not present

## 2015-05-20 DIAGNOSIS — J90 Pleural effusion, not elsewhere classified: Secondary | ICD-10-CM | POA: Diagnosis not present

## 2015-05-20 DIAGNOSIS — M810 Age-related osteoporosis without current pathological fracture: Secondary | ICD-10-CM | POA: Diagnosis not present

## 2015-05-20 DIAGNOSIS — I480 Paroxysmal atrial fibrillation: Secondary | ICD-10-CM | POA: Diagnosis not present

## 2015-05-20 DIAGNOSIS — R2689 Other abnormalities of gait and mobility: Secondary | ICD-10-CM | POA: Insufficient documentation

## 2015-05-20 DIAGNOSIS — Z880 Allergy status to penicillin: Secondary | ICD-10-CM | POA: Insufficient documentation

## 2015-05-20 DIAGNOSIS — R4182 Altered mental status, unspecified: Secondary | ICD-10-CM | POA: Diagnosis not present

## 2015-05-20 DIAGNOSIS — R29810 Facial weakness: Secondary | ICD-10-CM | POA: Diagnosis not present

## 2015-05-20 DIAGNOSIS — E039 Hypothyroidism, unspecified: Secondary | ICD-10-CM | POA: Insufficient documentation

## 2015-05-20 DIAGNOSIS — I4891 Unspecified atrial fibrillation: Secondary | ICD-10-CM | POA: Diagnosis not present

## 2015-05-20 DIAGNOSIS — Z862 Personal history of diseases of the blood and blood-forming organs and certain disorders involving the immune mechanism: Secondary | ICD-10-CM | POA: Insufficient documentation

## 2015-05-20 DIAGNOSIS — S72041A Displaced fracture of base of neck of right femur, initial encounter for closed fracture: Secondary | ICD-10-CM | POA: Diagnosis not present

## 2015-05-20 LAB — CBC WITH DIFFERENTIAL/PLATELET
BASOS PCT: 0 %
Basophils Absolute: 0 10*3/uL (ref 0.0–0.1)
Eosinophils Absolute: 0.3 10*3/uL (ref 0.0–0.7)
Eosinophils Relative: 3 %
HCT: 39.3 % (ref 36.0–46.0)
HEMOGLOBIN: 12.5 g/dL (ref 12.0–15.0)
Lymphocytes Relative: 20 %
Lymphs Abs: 1.6 10*3/uL (ref 0.7–4.0)
MCH: 29.8 pg (ref 26.0–34.0)
MCHC: 31.8 g/dL (ref 30.0–36.0)
MCV: 93.8 fL (ref 78.0–100.0)
Monocytes Absolute: 0.7 10*3/uL (ref 0.1–1.0)
Monocytes Relative: 9 %
NEUTROS ABS: 5.3 10*3/uL (ref 1.7–7.7)
NEUTROS PCT: 68 %
Platelets: 276 10*3/uL (ref 150–400)
RBC: 4.19 MIL/uL (ref 3.87–5.11)
RDW: 14 % (ref 11.5–15.5)
WBC: 7.9 10*3/uL (ref 4.0–10.5)

## 2015-05-20 LAB — BASIC METABOLIC PANEL
ANION GAP: 7 (ref 5–15)
BUN: 15 mg/dL (ref 6–20)
CALCIUM: 10.6 mg/dL — AB (ref 8.9–10.3)
CO2: 30 mmol/L (ref 22–32)
CREATININE: 1.01 mg/dL — AB (ref 0.44–1.00)
Chloride: 98 mmol/L — ABNORMAL LOW (ref 101–111)
GFR, EST AFRICAN AMERICAN: 58 mL/min — AB (ref >60)
GFR, EST NON AFRICAN AMERICAN: 50 mL/min — AB (ref >60)
Glucose, Bld: 102 mg/dL — ABNORMAL HIGH (ref 65–99)
Potassium: 3.2 mmol/L — ABNORMAL LOW (ref 3.5–5.1)
Sodium: 135 mmol/L (ref 135–145)

## 2015-05-20 LAB — URINALYSIS, ROUTINE W REFLEX MICROSCOPIC
Bilirubin Urine: NEGATIVE
Glucose, UA: NEGATIVE mg/dL
HGB URINE DIPSTICK: NEGATIVE
Ketones, ur: NEGATIVE mg/dL
NITRITE: NEGATIVE
PROTEIN: NEGATIVE mg/dL
Specific Gravity, Urine: 1.008 (ref 1.005–1.030)
UROBILINOGEN UA: 0.2 mg/dL (ref 0.0–1.0)
pH: 6.5 (ref 5.0–8.0)

## 2015-05-20 LAB — URINE MICROSCOPIC-ADD ON

## 2015-05-20 NOTE — Discharge Instructions (Signed)
Weakness Weakness is a lack of strength. It may be felt all over the body (generalized) or in one specific part of the body (focal). Some causes of weakness can be serious. You may need further medical evaluation, especially if you are elderly or you have a history of immunosuppression (such as chemotherapy or HIV), kidney disease, heart disease, or diabetes. CAUSES  Weakness can be caused by many different things, including:  Infection.  Physical exhaustion.  Internal bleeding or other blood loss that results in a lack of red blood cells (anemia).  Dehydration. This cause is more common in elderly people.  Side effects or electrolyte abnormalities from medicines, such as pain medicines or sedatives.  Emotional distress, anxiety, or depression.  Circulation problems, especially severe peripheral arterial disease.  Heart disease, such as rapid atrial fibrillation, bradycardia, or heart failure.  Nervous system disorders, such as Guillain-Barr syndrome, multiple sclerosis, or stroke. DIAGNOSIS  To find the cause of your weakness, your caregiver will take your history and perform a physical exam. Lab tests or X-rays may also be ordered, if needed. TREATMENT  Treatment of weakness depends on the cause of your symptoms and can vary greatly. HOME CARE INSTRUCTIONS   Rest as needed.  Eat a well-balanced diet.  Try to get some exercise every day.  Only take over-the-counter or prescription medicines as directed by your caregiver. SEEK MEDICAL CARE IF:   Your weakness seems to be getting worse or spreads to other parts of your body.  You develop new aches or pains. SEEK IMMEDIATE MEDICAL CARE IF:   You cannot perform your normal daily activities, such as getting dressed and feeding yourself.  You cannot walk up and down stairs, or you feel exhausted when you do so.  You have shortness of breath or chest pain.  You have difficulty moving parts of your body.  You have weakness  in only one area of the body or on only one side of the body.  You have a fever.  You have trouble speaking or swallowing.  You cannot control your bladder or bowel movements.  You have black or bloody vomit or stools. MAKE SURE YOU:  Understand these instructions.  Will watch your condition.  Will get help right away if you are not doing well or get worse. Document Released: 08/20/2005 Document Revised: 02/19/2012 Document Reviewed: 10/19/2011 Exit[employer] Patient Information 2015 [employer]. This information is not intended to replace advice given to you by your health care provider. Make sure you discuss any questions you have with your health care provider. Fall Prevention and Home Safety Falls cause injuries and can affect all age groups. It is possible to use preventive measures to significantly decrease the likelihood of falls. There are many simple measures which can make your home safer and prevent falls. OUTDOORS  Repair cracks and edges of walkways and driveways.  Remove high doorway thresholds.  Trim shrubbery on the main path into your home.  Have good outside lighting.  Clear walkways of tools, rocks, debris, and clutter.  Check that handrails are not broken and are securely fastened. Both sides of steps should have handrails.  Have leaves, snow, and ice cleared regularly.  Use sand or salt on walkways during winter months.  In the garage, clean up grease or oil spills. BATHROOM  Install night lights.  Install grab bars by the toilet and in the tub and shower.  Use non-skid mats or decals in the tub or shower.  Place a plastic non-slip   stool in the shower to sit on, if needed.  Keep floors dry and clean up all water on the floor immediately.  Remove soap buildup in the tub or shower on a regular basis.  Secure bath mats with non-slip, double-sided rug tape.  Remove throw rugs and tripping hazards from the floors. BEDROOMS  Install night  lights.  Make sure a bedside light is easy to reach.  Do not use oversized bedding.  Keep a telephone by your bedside.  Have a firm chair with side arms to use for getting dressed.  Remove throw rugs and tripping hazards from the floor. KITCHEN  Keep handles on pots and pans turned toward the center of the stove. Use back burners when possible.  Clean up spills quickly and allow time for drying.  Avoid walking on wet floors.  Avoid hot utensils and knives.  Position shelves so they are not too high or low.  Place commonly used objects within easy reach.  If necessary, use a sturdy step stool with a grab bar when reaching.  Keep electrical cables out of the way.  Do not use floor polish or wax that makes floors slippery. If you must use wax, use non-skid floor wax.  Remove throw rugs and tripping hazards from the floor. STAIRWAYS  Never leave objects on stairs.  Place handrails on both sides of stairways and use them. Fix any loose handrails. Make sure handrails on both sides of the stairways are as long as the stairs.  Check carpeting to make sure it is firmly attached along stairs. Make repairs to worn or loose carpet promptly.  Avoid placing throw rugs at the top or bottom of stairways, or properly secure the rug with carpet tape to prevent slippage. Get rid of throw rugs, if possible.  Have an electrician put in a light switch at the top and bottom of the stairs. OTHER FALL PREVENTION TIPS  Wear low-heel or rubber-soled shoes that are supportive and fit well. Wear closed toe shoes.  When using a stepladder, make sure it is fully opened and both spreaders are firmly locked. Do not climb a closed stepladder.  Add color or contrast paint or tape to grab bars and handrails in your home. Place contrasting color strips on first and last steps.  Learn and use mobility aids as needed. Install an electrical emergency response system.  Turn on lights to avoid dark areas.  Replace light bulbs that burn out immediately. Get light switches that glow.  Arrange furniture to create clear pathways. Keep furniture in the same place.  Firmly attach carpet with non-skid or double-sided tape.  Eliminate uneven floor surfaces.  Select a carpet pattern that does not visually hide the edge of steps.  Be aware of all pets. OTHER HOME SAFETY TIPS  Set the water temperature for 120 F (48.8 C).  Keep emergency numbers on or near the telephone.  Keep smoke detectors on every level of the home and near sleeping areas. Document Released: 08/10/2002 Document Revised: 02/19/2012 Document Reviewed: 11/09/2011 Exit[employer] Patient Information 2015 [employer]. This information is not intended to replace advice given to you by your health care provider. Make sure you discuss any questions you have with your health care provider.  

## 2015-05-20 NOTE — ED Notes (Signed)
Dr. Johnney Killian gave pt water and crackers.

## 2015-05-20 NOTE — ED Provider Notes (Signed)
CSN: [id number]     Arrival date & time 05/20/15  1620 History   First MD Initiated Contact with Patient 05/20/15 1623     Chief Complaint  Patient presents with  . Altered Mental Status  . Extremity Weakness     (Consider location/radiation/quality/duration/timing/severity/associated sxs/prior Treatment) HPI Symptoms of gait instability and balance problems have been waxing and waning in severity for a week now. Patient also has episodes where she is slightly more anxious or agitated. Her caregiver is with her and reports that her normal baseline is to sit in her chair quite a bit and then she will ambulate with a walker and in assistance to go to the bathroom. Today at about 2:30 she seemed to start to fall with her right leg giving out. Her caregiver was able to catch her and she did not fall to the ground. She also felt at that time that her right arm was somewhat weaker. This weakness has been something that has been waxing and waning. The caregiver reports that 3 days ago, she had a very bad day and seemed like she was going to fall down 3 times. She never did fall but at times when her caregiver grabbed her she became very agitated and nervous. She reports she was able to calm her down and she rested a lot that day. Past Medical History  Diagnosis Date  . History of cardiac catheterization 2002    Negative  . Vertebral fracture   . Restless legs syndrome with nocturnal myoclonus 05/13/2013     [place] controlled   . Abnormal uterine bleeding 4196,2229  . Hypothyroidism   . Arthritis   . Osteoporosis   . History of stomach ulcers   . History of jaundice as a child   . Anemia   . Head pain, chronic     "I have pain in the back of my head for months"  . Dizziness     TAKES MECLIZINE TO TREAT  . Hemiparesis and speech and language deficit as late effects of stroke 02/09/2015   Past Surgical History  Procedure Laterality Date  . Back surgery      x 3  . Breast surgery Bilateral      1 lump each breast removed and benign  . Nasal sinus surgery    . Dilation and curettage of uterus  1970  . Catheterization  2003    cardiac cath  . Kyphoplasty  2010  . Cataract extraction Bilateral 2011  . Cholecystectomy  1995    Lap  . Cardiac catheterization  2002    "it was normal" per pt - no record found  . Robotic assisted salpingo oopherectomy Bilateral 11/02/2014    Procedure: ROBOTIC ASSISTED bilateral SALPINGO OOPHORECTOMY;  Surgeon: Everitt Amber, MD;  Location: [facility];  Service: Gynecology;  Laterality: Bilateral;  . Laparoscopy N/A 11/02/2014    Procedure: LAPAROSCOPY DIAGNOSTIC;  Surgeon: Everitt Amber, MD;  Location: [facility];  Service: Gynecology;  Laterality: N/A;  . Abdominal hysterectomy     Family History  Problem Relation Age of Onset  . Asthma Brother   . Osteoarthritis Son   . Hypertension Father   . Stroke Father   . Cancer Brother     stomach   Social History  Substance Use Topics  . Smoking status: Never Smoker   . Smokeless tobacco: Never Used  . Alcohol Use: No   OB History    Gravida Para Term Preterm AB TAB SAB Ectopic Multiple Living  2 2       1 3      Review of Systems Patient cannot provide the level V caveat for dementia. Caregiver reports she has not been having problems with loss of appetite, diarrhea, fevers, or respiratory symptoms.   Allergies  Ibuprofen; Lyrica; Penicillins; Demerol; Dilaudid; Morphine and related; Percocet; Vicodin; Fentanyl; Lidoderm; Other; and Tramadol  Home Medications   Prior to Admission medications   Medication Sig Start Date End Date Taking? Authorizing Provider  amitriptyline (ELAVIL) 10 MG tablet Take 10 mg by mouth at bedtime. 04/26/15  Yes Historical Provider, MD  calcium gluconate 500 MG tablet Take 500 mg by mouth 2 (two) times daily. With 1000 units D3   Yes Historical Provider, MD  cholecalciferol (VITAMIN D) 1000 UNITS tablet Take 1,000 Units by mouth 2 (two) times daily. Take with calcium   Yes  Historical Provider, MD  Cranberry 200 MG CAPS Take 200 mg by mouth daily.   Yes Historical Provider, MD  denosumab (PROLIA) 60 MG/ML SOLN injection Inject 60 mg into the skin every 6 (six) months. Administer in upper arm, thigh, or abdomen   Yes Historical Provider, MD  ELIQUIS 5 MG TABS tablet Take 5 mg by mouth 2 (two) times daily. 05/12/15  Yes Historical Provider, MD  Flaxseed, Linseed, (FLAX SEED OIL) 1000 MG CAPS Take 1 capsule by mouth 2 (two) times daily.    Yes Historical Provider, MD  hyoscyamine (ANASPAZ) 0.125 MG TBDP disintergrating tablet Place 0.125 mg under the tongue 2 (two) times daily as needed for bladder spasms.   Yes Historical Provider, MD  levothyroxine (SYNTHROID, LEVOTHROID) 50 MCG tablet Take 1 tablet (50 mcg total) by mouth every morning. 03/25/15  Yes Ivan Anchors Love, PA-C  metoprolol tartrate (LOPRESSOR) 25 MG tablet Take 1 tablet (25 mg total) by mouth 2 (two) times daily. 03/25/15  Yes Ivan Anchors Love, PA-C  Multiple Vitamins-Minerals (CENTRUM SILVER PO) Take 1 tablet by mouth daily with lunch.    Yes Historical Provider, MD  pantoprazole (PROTONIX) 40 MG tablet Take 40 mg by mouth daily. 04/22/15  Yes Historical Provider, MD  pantoprazole sodium (PROTONIX) 40 mg/20 mL PACK Take 20 mLs (40 mg total) by mouth daily. 03/30/15  Yes [name], MD  Propylene Glycol (SYSTANE BALANCE OP) Apply 1 drop to eye 2 (two) times daily.   Yes Historical Provider, MD  rOPINIRole (REQUIP) 0.25 MG tablet Take 0.25 mg by mouth at bedtime.  04/25/15  Yes Historical Provider, MD  sertraline (ZOLOFT) 50 MG tablet Take 50 mg by mouth daily. 05/10/15  Yes Historical Provider, MD  tolterodine (DETROL LA) 4 MG 24 hr capsule Take 4 mg by mouth daily. 05/10/15  Yes Historical Provider, MD  acetaminophen (TYLENOL) 500 MG tablet Take 1 tablet (500 mg total) by mouth every 6 (six) hours as needed for mild pain. Usually only takes 2 x a  day 03/25/15   Ivan Anchors Love, PA-C  apixaban (ELIQUIS) 2.5 MG TABS  tablet Take 1 tablet (2.5 mg total) by mouth 2 (two) times daily. 03/25/15   Bary Leriche, PA-C  methocarbamol (ROBAXIN) 500 MG tablet Take 1 tablet (500 mg total) by mouth 3 (three) times daily. 03/25/15   Bary Leriche, PA-C  sennosides-docusate sodium (SENOKOT-S) 8.6-50 MG tablet Take 2 tablets by mouth daily as needed for constipation. 03/25/15   Ivan Anchors Love, PA-C   BP 163/73 mmHg  Pulse 56  Temp(Src) 98.1 F (36.7 C) (Oral)  Resp 25  SpO2 99%  LMP 09/03/1996 (Approximate) Physical Exam  Constitutional: She appears well-developed and well-nourished.  Patient is a slender elderly female who is alert but quiet. He has no respiratory distress. She is nontoxic in appearance.  HENT:  Head: Normocephalic and atraumatic.  Mouth/Throat: Oropharynx is clear and moist.  Eyes: EOM are normal. Pupils are equal, round, and reactive to light.  Neck: Neck supple.  Cardiovascular: Normal rate, regular rhythm, normal heart sounds and intact distal pulses.   Pulmonary/Chest: Effort normal and breath sounds normal.  Abdominal: Soft. Bowel sounds are normal. She exhibits no distension. There is no tenderness.  Musculoskeletal: Normal range of motion. She exhibits no edema or tenderness.  Neurological: She is alert. She has normal strength. No cranial nerve deficit. She exhibits normal muscle tone. Coordination normal. GCS eye subscore is 4. GCS verbal subscore is 5. GCS motor subscore is 6.  Patient perform grip strength with both hands to 5 out of 5 strength. She can also resist pulling at the biceps such that I can her forward in the bed with good strength. She performs plantar flexion and extension with excellent strength. With commands the patient can independently hold each leg off of the bed. She performs cranial nerve examination symmetrically. Patient does not speak much. This is reportedly her baseline.  Skin: Skin is warm, dry and intact.  Psychiatric: She has a normal mood and affect.    ED  Course  Procedures (including critical care time) Labs Review Labs Reviewed  BASIC METABOLIC PANEL - Abnormal; Notable for the following:    Potassium 3.2 (*)    Chloride 98 (*)    Glucose, Bld 102 (*)    Creatinine, Ser 1.01 (*)    Calcium 10.6 (*)    GFR calc non Af Amer 50 (*)    GFR calc Af Amer 58 (*)    All other components within normal limits  URINALYSIS, ROUTINE W REFLEX MICROSCOPIC (NOT AT [facility]) - Abnormal; Notable for the following:    Leukocytes, UA SMALL (*)    All other components within normal limits  URINE MICROSCOPIC-ADD ON - Abnormal; Notable for the following:    Casts HYALINE CASTS (*)    All other components within normal limits  CBC WITH DIFFERENTIAL/PLATELET    Imaging Review Ct Head Wo Contrast  05/20/2015   CLINICAL DATA:  Progressive right-sided weakness for 10 days.  EXAM: CT HEAD WITHOUT CONTRAST  TECHNIQUE: Contiguous axial images were obtained from the base of the skull through the vertex without intravenous contrast.  COMPARISON:  CT scan 03/14/2015  FINDINGS: Stable age related cerebral atrophy, ventriculomegaly and periventricular white matter disease. No extra-axial fluid collections are identified. Remote left parietal infarction is noted with encephalomalacia. There is also a remote left occipital infarct. No CT findings for acute hemispheric infarction or intracranial hemorrhage. No mass lesions. The brainstem and cerebellum are normal.  The bony structures are intact. No bone lesions or sinus disease. The globes are intact.  IMPRESSION: Stable age related cerebral atrophy, ventriculomegaly and periventricular white matter disease.  Remote infarctions.  No acute intracranial findings.   Electronically Signed   By: Marijo Sanes M.D.   On: 05/20/2015 17:46   I have personally reviewed and evaluated these images and lab results as part of my medical decision-making.   EKG Interpretation   Date/Time:  Friday May 20 2015 16:34:48 EDT Ventricular  Rate:  58 PR Interval:    QRS Duration: 101 QT Interval:  445 QTC Calculation:  437 R Axis:   8 Text Interpretation:  Junctional rhythm Low voltage, extremity and  precordial leads RSR' in V1 or V2, right VCD or RVH agree. no signficant  change from previous. Confirmed by Johnney Killian, MD, Jeannie Done [phone]) on  05/20/2015 4:40:34 PM     Recheck 19:55 I have tested the patient for oral intake. She is drinking water through a straw without difficulty, holding the cup herself and no evidence of choking or dysphagia. She is also able to eat a saltine cracker without any difficulty. Family members report this is a baseline for her. MDM   Final diagnoses:  Weakness  Neurologic gait dysfunction   Patient has been having intermittent problems with balance and coming close to falling several times. She does have a prior history of right-sided CVA with some weakness. At this time her neurologic examination is intact without localizing weakness and no evidence of acute infectious. She is well in appearance and she has good support in her home with present caregivers and assistance. The time I do feel she is safe for discharge to home.    Charlesetta Shanks, MD 05/20/15 2001

## 2015-05-20 NOTE — Progress Notes (Signed)
[facility] [address], [place] [address] [phone]  Date:  05/20/2015   Name:  Linda [name]   DOB:  20-Jun-1929   MRN:  [id number]  PCP:  [name], MD    Chief Complaint: No chief complaint on file.   History of Present Illness:  Linda [name] is a 81 y.o. 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  She was in rehab for about a week following her July hospitalization. Since her major stroke in April she has had a "few mini strokes"  She lives at home- a family member lives with her.  She has been able to understand speech but not speak clearly since the stroke.    She is here today with right sided weakness for the last 10 days.  Her caretaker notes that she had been doing pretty well recently, walking well and did not seem to have any weakness.  However over the last 10 days she has gotten worse, and today had a sudden attack where she seemed to lose her strength and nearly fell.  They called her PCP who asked them to bring her to see [place]  Her daughter in law and caretaker are by her bedside now.    Patient Active Problem List   Diagnosis Date Noted  . Apraxia complicating stroke 99/35/7017  . 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 Embolic stroke involving left posterior cerebral artery 03/17/2015  . Sequela, post-stroke 03/17/2015  . Encephalopathy 03/14/2015  . UTI (lower urinary tract infection) 03/14/2015  . Altered mental state 03/14/2015  . Chest pain 03/14/2015  . Back pain   . Constipation   . CVA (cerebral infarction) 02/18/2015  . Hemiparesis and speech and language deficit as late effects of stroke 02/09/2015  . History of upper gastrointestinal bleeding   . Long-term (current) use of  anticoagulants   . Protein-calorie malnutrition, severe 12/27/2014  . Paroxysmal atrial fibrillation   . Left middle cerebral artery stroke 12/25/2014  . Essential hypertension 12/25/2014  . Dysphagia, pharyngoesophageal phase 12/25/2014  . Hypothyroidism 12/25/2014  . Restless legs syndrome with nocturnal myoclonus 05/13/2013    Past Medical History  Diagnosis Date  . History of cardiac catheterization 2002    Negative  . Vertebral fracture   . Restless legs syndrome with nocturnal myoclonus 05/13/2013     requip controlled   . Abnormal uterine bleeding 7939,0300  . Hypothyroidism   . Arthritis   . Osteoporosis   . History of stomach ulcers   . History of jaundice as a child   . Anemia   . Head pain, chronic     "I have pain in the back of my head for months"  . Dizziness     TAKES MECLIZINE TO TREAT  . Hemiparesis and speech and language deficit as late effects of stroke 02/09/2015    Past Surgical History  Procedure Laterality Date  . Back surgery      x 3  . Breast surgery Bilateral     1 lump each breast removed and benign  . Nasal sinus surgery    . Dilation and curettage of uterus  1970  . Catheterization  2003    cardiac cath  . Kyphoplasty  2010  .  Cataract extraction Bilateral 2011  . Cholecystectomy  1995    Lap  . Cardiac catheterization  2002    "it was normal" per pt - no record found  . Robotic assisted salpingo oopherectomy Bilateral 11/02/2014    Procedure: ROBOTIC ASSISTED bilateral SALPINGO OOPHORECTOMY;  Surgeon: Everitt Amber, MD;  Location: [facility];  Service: Gynecology;  Laterality: Bilateral;  . Laparoscopy N/A 11/02/2014    Procedure: LAPAROSCOPY DIAGNOSTIC;  Surgeon: Everitt Amber, MD;  Location: [facility];  Service: Gynecology;  Laterality: N/A;  . Abdominal hysterectomy      Social History  Substance Use Topics  . Smoking status: Never Smoker   . Smokeless tobacco: Never Used  . Alcohol Use: No    Family History  Problem Relation Age of Onset   . Asthma Brother   . Osteoarthritis Son   . Hypertension Father   . Stroke Father   . Cancer Brother     stomach    Allergies  Allergen Reactions  . Ibuprofen Anaphylaxis    Passed out and had to be hospitalized  . Penicillins     Skin on head was crawling and very very sleepy  . Demerol [Meperidine Hcl] Nausea And Vomiting  . Dilaudid [Hydromorphone Hcl] Nausea And Vomiting  . Fentanyl Other (See Comments)    unknown  . Lidoderm [Lidocaine] Other (See Comments)    unknown  . Lyrica [Pregabalin]     "made me a zombie'  . Morphine And Related Nausea And Vomiting  . Other     hamburger  . Percocet [Oxycodone-Acetaminophen] Nausea And Vomiting  . Tramadol Other (See Comments)    unknown  . Vicodin [Hydrocodone-Acetaminophen] Nausea And Vomiting    Medication list has been reviewed and updated.  Current Outpatient Prescriptions on File Prior to Visit  Medication Sig Dispense Refill  . acetaminophen (TYLENOL) 500 MG tablet Take 1 tablet (500 mg total) by mouth every 6 (six) hours as needed for mild pain. Usually only takes 2 x a  day 30 tablet 0  . apixaban (ELIQUIS) 2.5 MG TABS tablet Take 1 tablet (2.5 mg total) by mouth 2 (two) times daily. 60 tablet 1  . calcium gluconate 500 MG tablet Take 500 mg by mouth 2 (two) times daily. With 1000 units D3    . cholecalciferol (VITAMIN D) 1000 UNITS tablet Take 1,000 Units by mouth 2 (two) times daily. Take with calium    . denosumab (PROLIA) 60 MG/ML SOLN injection Inject 60 mg into the skin every 6 (six) months. Administer in upper arm, thigh, or abdomen    . docusate sodium (COLACE) 100 MG capsule Take 100 mg by mouth 2 (two) times daily as needed for mild constipation or moderate constipation.    . Flaxseed, Linseed, (FLAX SEED OIL) 1000 MG CAPS Take 1 capsule by mouth 2 (two) times daily.     . hyoscyamine (ANASPAZ) 0.125 MG TBDP disintergrating tablet Place 0.125 mg under the tongue 2 (two) times daily as needed for bladder  spasms.    Marland Kitchen levothyroxine (SYNTHROID, LEVOTHROID) 50 MCG tablet Take 1 tablet (50 mcg total) by mouth every morning. 30 tablet 1  . methocarbamol (ROBAXIN) 500 MG tablet Take 1 tablet (500 mg total) by mouth 3 (three) times daily. 90 tablet 1  . metoprolol tartrate (LOPRESSOR) 25 MG tablet Take 1 tablet (25 mg total) by mouth 2 (two) times daily. 30 tablet 1  . Multiple Vitamins-Minerals (CENTRUM SILVER PO) Take 1 tablet by mouth daily with lunch.     Marland Kitchen  pantoprazole sodium (PROTONIX) 40 mg/20 mL PACK Take 20 mLs (40 mg total) by mouth daily. 30 each 1  . Propylene Glycol (SYSTANE BALANCE OP) Apply 1 drop to eye 2 (two) times daily.    . sennosides-docusate sodium (SENOKOT-S) 8.6-50 MG tablet Take 2 tablets by mouth daily as needed for constipation.     No current facility-administered medications on file prior to visit.    Review of Systems:  As per HPI- otherwise negative.   Physical Examination: Filed Vitals:   05/20/15 1524  BP: 110/70  Pulse: 59  Temp: 97.8 F (36.6 C)   There were no vitals filed for this visit. There is no weight on file to calculate BMI. Ideal Body Weight:    GEN: NAD, Non-toxic, Alert, cannot judge orientation as she is not able to communicate clearly.  Very thin, appears weak and frail Shakes head no to question about any pain in her body HEENT: Atraumatic, Normocephalic. Neck supple. No masses, No LAD. Ears and Nose: No external deformity. CV: RRR, No M/G/R. No JVD. No thrill. No extra heart sounds. PULM: CTA B, no wheezes, crackles, rhonchi. No retractions. No resp. distress. No accessory muscle use. ABD: S, NT, ND. No rebound. No HSM. EXTR: No c/c/e NEURO brought back in WC.  Able to sit up with assistance.  Weakness of right sided grip and of right leg.   PSYCH: Normally interactive. Conversant. Not depressed or anxious appearing.  Calm demeanor.    Assessment and Plan: Altered mental status, unspecified altered mental status type  History of  recent stroke  Right sided weakness  Here today with history of recent stroke that resulted in right sided weakness and aphasia  Her weakness and confusion has been worsening over the last 7-10 days and her caretaker noted that she nearly fell today.    They called for advice and her PCP told them to come to our clinic today.  Will refer to ED via EMS for further evaluation.  EMS arrived to transport at 3:50 pm, called to alert charge nurse at [place], MD

## 2015-05-20 NOTE — ED Notes (Signed)
MD at bedside at this time.

## 2015-05-20 NOTE — ED Notes (Signed)
Dr. Pfeiffer at bedside   

## 2015-05-20 NOTE — ED Notes (Signed)
Per EMS: Pt coming from [facility] on [address] With c/o ams, progressive right sided weakness for ten days. Pt was brought in by daughter and caretaker. With EMS pt is at baseline. Pt has hx of CVA with right sided weakness. Pt denies pain. Caretaker states pt has episodes of good days and bad days.

## 2015-05-20 NOTE — ED Notes (Signed)
Pt unable to use bedpan. Pt provided with bedside commode per family request.

## 2015-05-21 ENCOUNTER — Emergency Department (HOSPITAL_COMMUNITY): Payer: Medicare Other

## 2015-05-21 ENCOUNTER — Encounter (HOSPITAL_COMMUNITY): Payer: Self-pay | Admitting: *Deleted

## 2015-05-21 ENCOUNTER — Inpatient Hospital Stay (HOSPITAL_COMMUNITY)
Admission: EM | Admit: 2015-05-21 | Discharge: 2015-05-25 | DRG: 470 | Disposition: A | Discharge status: Skilled Nursing Facility | Payer: Medicare Other | Attending: Internal Medicine | Admitting: Internal Medicine

## 2015-05-21 DIAGNOSIS — I69359 Hemiplegia and hemiparesis following cerebral infarction affecting unspecified side: Secondary | ICD-10-CM

## 2015-05-21 DIAGNOSIS — S72002A Fracture of unspecified part of neck of left femur, initial encounter for closed fracture: Secondary | ICD-10-CM | POA: Diagnosis not present

## 2015-05-21 DIAGNOSIS — M25559 Pain in unspecified hip: Secondary | ICD-10-CM | POA: Diagnosis not present

## 2015-05-21 DIAGNOSIS — E039 Hypothyroidism, unspecified: Secondary | ICD-10-CM | POA: Diagnosis present

## 2015-05-21 DIAGNOSIS — Z823 Family history of stroke: Secondary | ICD-10-CM | POA: Diagnosis not present

## 2015-05-21 DIAGNOSIS — I480 Paroxysmal atrial fibrillation: Secondary | ICD-10-CM | POA: Diagnosis present

## 2015-05-21 DIAGNOSIS — Z471 Aftercare following joint replacement surgery: Secondary | ICD-10-CM | POA: Diagnosis not present

## 2015-05-21 DIAGNOSIS — D62 Acute posthemorrhagic anemia: Secondary | ICD-10-CM | POA: Diagnosis not present

## 2015-05-21 DIAGNOSIS — Z9181 History of falling: Secondary | ICD-10-CM | POA: Diagnosis not present

## 2015-05-21 DIAGNOSIS — S72041A Displaced fracture of base of neck of right femur, initial encounter for closed fracture: Secondary | ICD-10-CM | POA: Diagnosis not present

## 2015-05-21 DIAGNOSIS — Z880 Allergy status to penicillin: Secondary | ICD-10-CM | POA: Diagnosis not present

## 2015-05-21 DIAGNOSIS — Z888 Allergy status to other drugs, medicaments and biological substances status: Secondary | ICD-10-CM | POA: Diagnosis not present

## 2015-05-21 DIAGNOSIS — Z885 Allergy status to narcotic agent status: Secondary | ICD-10-CM

## 2015-05-21 DIAGNOSIS — W1830XA Fall on same level, unspecified, initial encounter: Secondary | ICD-10-CM | POA: Diagnosis present

## 2015-05-21 DIAGNOSIS — Z96641 Presence of right artificial hip joint: Secondary | ICD-10-CM | POA: Diagnosis not present

## 2015-05-21 DIAGNOSIS — R278 Other lack of coordination: Secondary | ICD-10-CM | POA: Diagnosis not present

## 2015-05-21 DIAGNOSIS — D649 Anemia, unspecified: Secondary | ICD-10-CM | POA: Diagnosis not present

## 2015-05-21 DIAGNOSIS — Z66 Do not resuscitate: Secondary | ICD-10-CM | POA: Diagnosis present

## 2015-05-21 DIAGNOSIS — N9989 Other postprocedural complications and disorders of genitourinary system: Secondary | ICD-10-CM | POA: Diagnosis not present

## 2015-05-21 DIAGNOSIS — I69351 Hemiplegia and hemiparesis following cerebral infarction affecting right dominant side: Secondary | ICD-10-CM

## 2015-05-21 DIAGNOSIS — E43 Unspecified severe protein-calorie malnutrition: Secondary | ICD-10-CM | POA: Diagnosis present

## 2015-05-21 DIAGNOSIS — M810 Age-related osteoporosis without current pathological fracture: Secondary | ICD-10-CM | POA: Diagnosis present

## 2015-05-21 DIAGNOSIS — I69320 Aphasia following cerebral infarction: Secondary | ICD-10-CM | POA: Diagnosis not present

## 2015-05-21 DIAGNOSIS — Z886 Allergy status to analgesic agent status: Secondary | ICD-10-CM | POA: Diagnosis not present

## 2015-05-21 DIAGNOSIS — S72001D Fracture of unspecified part of neck of right femur, subsequent encounter for closed fracture with routine healing: Secondary | ICD-10-CM | POA: Diagnosis not present

## 2015-05-21 DIAGNOSIS — F0390 Unspecified dementia without behavioral disturbance: Secondary | ICD-10-CM | POA: Diagnosis present

## 2015-05-21 DIAGNOSIS — Z7901 Long term (current) use of anticoagulants: Secondary | ICD-10-CM | POA: Diagnosis not present

## 2015-05-21 DIAGNOSIS — S72001A Fracture of unspecified part of neck of right femur, initial encounter for closed fracture: Secondary | ICD-10-CM | POA: Diagnosis not present

## 2015-05-21 DIAGNOSIS — S72001E Fracture of unspecified part of neck of right femur, subsequent encounter for open fracture type I or II with routine healing: Secondary | ICD-10-CM | POA: Diagnosis not present

## 2015-05-21 DIAGNOSIS — M21251 Flexion deformity, right hip: Secondary | ICD-10-CM | POA: Diagnosis not present

## 2015-05-21 DIAGNOSIS — R338 Other retention of urine: Secondary | ICD-10-CM | POA: Diagnosis not present

## 2015-05-21 DIAGNOSIS — G2581 Restless legs syndrome: Secondary | ICD-10-CM | POA: Diagnosis present

## 2015-05-21 DIAGNOSIS — I10 Essential (primary) hypertension: Secondary | ICD-10-CM | POA: Diagnosis not present

## 2015-05-21 DIAGNOSIS — R6889 Other general symptoms and signs: Secondary | ICD-10-CM | POA: Diagnosis not present

## 2015-05-21 DIAGNOSIS — Z419 Encounter for procedure for purposes other than remedying health state, unspecified: Secondary | ICD-10-CM

## 2015-05-21 DIAGNOSIS — Z79899 Other long term (current) drug therapy: Secondary | ICD-10-CM

## 2015-05-21 DIAGNOSIS — M6281 Muscle weakness (generalized): Secondary | ICD-10-CM | POA: Diagnosis not present

## 2015-05-21 DIAGNOSIS — S72051D Unspecified fracture of head of right femur, subsequent encounter for closed fracture with routine healing: Secondary | ICD-10-CM | POA: Diagnosis not present

## 2015-05-21 DIAGNOSIS — R2681 Unsteadiness on feet: Secondary | ICD-10-CM | POA: Diagnosis not present

## 2015-05-21 DIAGNOSIS — E876 Hypokalemia: Secondary | ICD-10-CM | POA: Diagnosis not present

## 2015-05-21 DIAGNOSIS — I69328 Other speech and language deficits following cerebral infarction: Secondary | ICD-10-CM

## 2015-05-21 DIAGNOSIS — J90 Pleural effusion, not elsewhere classified: Secondary | ICD-10-CM | POA: Diagnosis not present

## 2015-05-21 DIAGNOSIS — I63012 Cerebral infarction due to thrombosis of left vertebral artery: Secondary | ICD-10-CM | POA: Diagnosis not present

## 2015-05-21 LAB — COMPREHENSIVE METABOLIC PANEL
ALK PHOS: 163 U/L — AB (ref 38–126)
ALT: 71 U/L — AB (ref 14–54)
ANION GAP: 8 (ref 5–15)
AST: 67 U/L — ABNORMAL HIGH (ref 15–41)
Albumin: 3.8 g/dL (ref 3.5–5.0)
BILIRUBIN TOTAL: 0.5 mg/dL (ref 0.3–1.2)
BUN: 17 mg/dL (ref 6–20)
CALCIUM: 10.5 mg/dL — AB (ref 8.9–10.3)
CO2: 30 mmol/L (ref 22–32)
CREATININE: 0.99 mg/dL (ref 0.44–1.00)
Chloride: 100 mmol/L — ABNORMAL LOW (ref 101–111)
GFR, EST AFRICAN AMERICAN: 59 mL/min — AB (ref >60)
GFR, EST NON AFRICAN AMERICAN: 51 mL/min — AB (ref >60)
Glucose, Bld: 122 mg/dL — ABNORMAL HIGH (ref 65–99)
Potassium: 3.2 mmol/L — ABNORMAL LOW (ref 3.5–5.1)
Sodium: 138 mmol/L (ref 135–145)
TOTAL PROTEIN: 7 g/dL (ref 6.5–8.1)

## 2015-05-21 LAB — CBC WITH DIFFERENTIAL/PLATELET
BASOS PCT: 1 %
Basophils Absolute: 0.1 10*3/uL (ref 0.0–0.1)
EOS ABS: 0.1 10*3/uL (ref 0.0–0.7)
Eosinophils Relative: 1 %
HCT: 38.3 % (ref 36.0–46.0)
Hemoglobin: 12.4 g/dL (ref 12.0–15.0)
Lymphocytes Relative: 13 %
Lymphs Abs: 1.3 10*3/uL (ref 0.7–4.0)
MCH: 30.2 pg (ref 26.0–34.0)
MCHC: 32.4 g/dL (ref 30.0–36.0)
MCV: 93.2 fL (ref 78.0–100.0)
MONO ABS: 0.6 10*3/uL (ref 0.1–1.0)
MONOS PCT: 6 %
NEUTROS ABS: 7.8 10*3/uL — AB (ref 1.7–7.7)
Neutrophils Relative %: 79 %
PLATELETS: 302 10*3/uL (ref 150–400)
RBC: 4.11 MIL/uL (ref 3.87–5.11)
RDW: 14 % (ref 11.5–15.5)
WBC: 9.8 10*3/uL (ref 4.0–10.5)

## 2015-05-21 LAB — TYPE AND SCREEN
ABO/RH(D): O POS
Antibody Screen: NEGATIVE

## 2015-05-21 LAB — PROTIME-INR
INR: 1.07 (ref 0.00–1.49)
PROTHROMBIN TIME: 14.1 s (ref 11.6–15.2)

## 2015-05-21 MED ORDER — POTASSIUM CHLORIDE CRYS ER 20 MEQ PO TBCR
40.0000 meq | EXTENDED_RELEASE_TABLET | Freq: Once | ORAL | Status: AC
  Administered 2015-05-21: 40 meq via ORAL
  Filled 2015-05-21: qty 2

## 2015-05-21 MED ORDER — HYDROCODONE-ACETAMINOPHEN 5-325 MG PO TABS
1.0000 | ORAL_TABLET | ORAL | Status: AC | PRN
  Administered 2015-05-21: 1 via ORAL
  Administered 2015-05-22: 2 via ORAL
  Filled 2015-05-21: qty 1
  Filled 2015-05-21: qty 2

## 2015-05-21 MED ORDER — FENTANYL CITRATE (PF) 100 MCG/2ML IJ SOLN
50.0000 ug | INTRAMUSCULAR | Status: DC | PRN
  Filled 2015-05-21: qty 2

## 2015-05-21 MED ORDER — ONDANSETRON HCL 4 MG/2ML IJ SOLN: 4.0000 mg | Freq: Three times a day (TID) | INTRAMUSCULAR | Status: DC | PRN

## 2015-05-21 NOTE — ED Notes (Signed)
Nurse drawing labs. 

## 2015-05-21 NOTE — ED Notes (Signed)
[facility] was called and notified of pt's transfer to [facility].

## 2015-05-21 NOTE — ED Notes (Signed)
Pt had just come out of shower and caregiver was assisting with getting dressed.  Pt reports legs got "twisted up" and fell, denies LOC, or hitting head. No pain other than hip, some dementia.

## 2015-05-21 NOTE — ED Provider Notes (Signed)
CSN: [id number]     Arrival date & time 05/21/15  1735 History   First MD Initiated Contact with Patient 05/21/15 1804     Chief Complaint  Patient presents with  . Fall    Rt hip pain     (Consider location/radiation/quality/duration/timing/severity/associated sxs/prior Treatment) The history is provided by the patient and medical records.    This is an 81 year old female with history of hypothyroidism, anemia, dizziness, prior CVA with hemiparesis and language deficits, presenting to the ED after a fall. Majority of history is provided by patient's caregiver at bedside, [place].  States she had put patient into bed to lie down for a nap and heard some commotion so she went into her room and found her standing at the bathroom door-- she was not using her walker at this time. States she assisted her to the toilet once she finished they were trying to go back to her bed when her feet got "tangled up" with right over left, causing her to fall onto her right hip.  Caregiver denies head injury or LOC.  Patient was able to be assisted off the floor and limp over to bed but began complaining of pain in her right hip.  Patient denies any other injuries or pain aside from hip.  Patient walks with walker at baseline.  No recent illness.  No chest pain or SOB.  Patient was seen in the ED yesterday for possible TIA, however was released after normal work-up.  Patient is on Eliquis. VSS.  Past Medical History  Diagnosis Date  . History of cardiac catheterization 2002    Negative  . Vertebral fracture   . Restless legs syndrome with nocturnal myoclonus 05/13/2013     requip controlled   . Abnormal uterine bleeding 0539,7673  . Hypothyroidism   . Arthritis   . Osteoporosis   . History of stomach ulcers   . History of jaundice as a child   . Anemia   . Head pain, chronic     "I have pain in the back of my head for months"  . Dizziness     TAKES MECLIZINE TO TREAT  . Hemiparesis and speech and  language deficit as late effects of stroke 02/09/2015   Past Surgical History  Procedure Laterality Date  . Back surgery      x 3  . Breast surgery Bilateral     1 lump each breast removed and benign  . Nasal sinus surgery    . Dilation and curettage of uterus  1970  . Catheterization  2003    cardiac cath  . Kyphoplasty  2010  . Cataract extraction Bilateral 2011  . Cholecystectomy  1995    Lap  . Cardiac catheterization  2002    "it was normal" per pt - no record found  . Robotic assisted salpingo oopherectomy Bilateral 11/02/2014    Procedure: ROBOTIC ASSISTED bilateral SALPINGO OOPHORECTOMY;  Surgeon: Everitt Amber, MD;  Location: [facility];  Service: Gynecology;  Laterality: Bilateral;  . Laparoscopy N/A 11/02/2014    Procedure: LAPAROSCOPY DIAGNOSTIC;  Surgeon: Everitt Amber, MD;  Location: [facility];  Service: Gynecology;  Laterality: N/A;  . Abdominal hysterectomy     Family History  Problem Relation Age of Onset  . Asthma Brother   . Osteoarthritis Son   . Hypertension Father   . Stroke Father   . Cancer Brother     stomach   Social History  Substance Use Topics  . Smoking status: Never Smoker   .  Smokeless tobacco: Never Used  . Alcohol Use: No   OB History    Gravida Para Term Preterm AB TAB SAB Ectopic Multiple Living   2 2       1 3      Review of Systems  Musculoskeletal: Positive for arthralgias.  All other systems reviewed and are negative.     Allergies  Ibuprofen; Lyrica; Penicillins; Demerol; Dilaudid; Morphine and related; Percocet; Vicodin; Fentanyl; Lidoderm; Other; and Tramadol  Home Medications   Prior to Admission medications   Medication Sig Start Date End Date Taking? Authorizing Provider  acetaminophen (TYLENOL) 500 MG tablet Take 1 tablet (500 mg total) by mouth every 6 (six) hours as needed for mild pain. Usually only takes 2 x a  day 03/25/15   Ivan Anchors Love, PA-C  amitriptyline (ELAVIL) 10 MG tablet Take 10 mg by mouth at bedtime. 04/26/15    Historical Provider, MD  apixaban (ELIQUIS) 2.5 MG TABS tablet Take 1 tablet (2.5 mg total) by mouth 2 (two) times daily. 03/25/15   Bary Leriche, PA-C  calcium gluconate 500 MG tablet Take 500 mg by mouth 2 (two) times daily. With 1000 units D3    Historical Provider, MD  cholecalciferol (VITAMIN D) 1000 UNITS tablet Take 1,000 Units by mouth 2 (two) times daily. Take with calcium    Historical Provider, MD  denosumab (PROLIA) 60 MG/ML SOLN injection Inject 60 mg into the skin every 6 (six) months. Administer in upper arm, thigh, or abdomen    Historical Provider, MD  ELIQUIS 5 MG TABS tablet Take 5 mg by mouth 2 (two) times daily. 05/12/15   Historical Provider, MD  hyoscyamine (ANASPAZ) 0.125 MG TBDP disintergrating tablet Place 0.125 mg under the tongue 2 (two) times daily as needed for bladder spasms.    Historical Provider, MD  levothyroxine (SYNTHROID, LEVOTHROID) 50 MCG tablet Take 1 tablet (50 mcg total) by mouth every morning. 03/25/15   Ivan Anchors Love, PA-C  methocarbamol (ROBAXIN) 500 MG tablet Take 1 tablet (500 mg total) by mouth 3 (three) times daily. 03/25/15   Bary Leriche, PA-C  metoprolol tartrate (LOPRESSOR) 25 MG tablet Take 1 tablet (25 mg total) by mouth 2 (two) times daily. 03/25/15   Bary Leriche, PA-C  pantoprazole (PROTONIX) 40 MG tablet Take 40 mg by mouth daily. 04/22/15   Historical Provider, MD  pantoprazole sodium (PROTONIX) 40 mg/20 mL PACK Take 20 mLs (40 mg total) by mouth daily. 03/30/15   [name], MD  Propylene Glycol (SYSTANE BALANCE OP) Apply 1 drop to eye 2 (two) times daily.    Historical Provider, MD  sennosides-docusate sodium (SENOKOT-S) 8.6-50 MG tablet Take 2 tablets by mouth daily as needed for constipation. 03/25/15   Bary Leriche, PA-C  sertraline (ZOLOFT) 50 MG tablet Take 50 mg by mouth daily. 05/10/15   Historical Provider, MD  tolterodine (DETROL LA) 4 MG 24 hr capsule Take 4 mg by mouth daily. 05/10/15   Historical Provider, MD   BP 163/71 mmHg   Pulse 104  Temp(Src) 97.4 F (36.3 C) (Oral)  Resp 20  SpO2 95%  LMP 09/03/1996 (Approximate)   Physical Exam  Constitutional: She is oriented to person, place, and time. She appears well-developed and well-nourished.  Very thin, elderly  HENT:  Head: Normocephalic and atraumatic.  Mouth/Throat: Oropharynx is clear and moist.  Eyes: Conjunctivae and EOM are normal. Pupils are equal, round, and reactive to light.  Neck: Normal range of motion.  Cardiovascular:  Normal rate, regular rhythm and normal heart sounds.   Pulmonary/Chest: Effort normal and breath sounds normal. No respiratory distress. She has no wheezes.  Abdominal: Soft. Bowel sounds are normal.  Musculoskeletal: Normal range of motion.  Right hip tender to palpation along lateral aspect, externally rotated and shortened, DP pulse intact, normal cap refill  Neurological: She is alert and oriented to person, place, and time.  Skin: Skin is warm and dry.  Psychiatric: She has a normal mood and affect.  Nursing note and vitals reviewed.   ED Course  Procedures (including critical care time) Labs Review Labs Reviewed  CBC WITH DIFFERENTIAL/PLATELET - Abnormal; Notable for the following:    Neutro Abs 7.8 (*)    All other components within normal limits  COMPREHENSIVE METABOLIC PANEL - Abnormal; Notable for the following:    Potassium 3.2 (*)    Chloride 100 (*)    Glucose, Bld 122 (*)    Calcium 10.5 (*)    AST 67 (*)    ALT 71 (*)    Alkaline Phosphatase 163 (*)    GFR calc non Af Amer 51 (*)    GFR calc Af Amer 59 (*)    All other components within normal limits  PROTIME-INR  TYPE AND SCREEN    Imaging Review Dg Chest 1 View  05/21/2015   CLINICAL DATA:  81 year old female with fall  EXAM: CHEST  1 VIEW  COMPARISON:  Radiograph dated 03/14/2015  FINDINGS: Single-view of the chest demonstrates emphysematous changes of the lungs. There is a focal area of opacity at the left lower lung field likely combination  of small pleural effusion and subsegmental atelectatic changes of the adjacent lung. Superimposed pneumonia is not excluded. There is no pneumothorax. The cardiac silhouette is within normal limits. There is diffuse osteopenia with scoliosis and degenerative changes of the thoracic spine. Stable mid thoracic vertebroplasty changes noted. No definite acute fracture identified.  IMPRESSION: Small left pleural effusion and associated subsegmental atelectasis of the adjacent lung. No focal  Osteopenia with degenerative changes of the spine. No definite acute fracture. No pneumothorax.   Electronically Signed   By: Anner Crete M.D.   On: 05/21/2015 19:35     Dg Hip Unilat With Pelvis 2-3 Views Right  05/21/2015   CLINICAL DATA:  Fall.  Right hip pain.  Pt had just come out of shower and caregiver was assisting with getting dressed. Pt reports legs got "twisted up" and fell, denies LOC, or hitting head. No pain other than hip, some dementia. Hx of breast surgery, cardiac catheterization.  EXAM: DG HIP (WITH OR WITHOUT PELVIS) 2-3V RIGHT  COMPARISON:  None.  FINDINGS: There is a fracture of the right femoral neck. Fracture is mid cervical. No significant comminution. There is displacement, with the distal fracture component retracting superiorly, displacing by approximately 1.8 cm. There is also varus angulation.  Right femoral head is normally aligned in the acetabulum.  Bony pelvis is intact. Left hip joint is normally spaced and aligned. Bones are diffusely demineralized.  IMPRESSION: Displaced fracture of the right femoral neck with varus angulation.   Electronically Signed   By: Lajean Manes M.D.   On: 05/21/2015 19:57   I have personally reviewed and evaluated these images and lab results as part of my medical decision-making.   EKG Interpretation None      MDM   Final diagnoses:  Closed right hip fracture, initial encounter  Hypokalemia   81 year old female here with a mechanical fall  at  her car prior to arrival. Patient was ambulating to the restroom without her walker and fell. No head injury or loss of consciousness. Fall was witnessed by her caregiver. On exam patient has tenderness of the lateral right hip, external rotation, and leg shortening. I have strong clinical suspicion for hip fracture. Will obtain x-ray and lab work as well as preop EKG and chest x-ray.  X-ray confirms displaced fracture of right femoral neck. Labwork is overall reassuring, slight hypokalemia noted and was replaced here in ED. Case discussed with on call orthopedics, Dr. Erlinda Hong-- will plan for ORIF tomorrow, NPO after midnight but does need patient transferred to [place].  Hospitalist service, Dr. Arnoldo Morale, to admit for further management.  Larene Pickett, PA-C 05/21/15 [place], MD 05/22/15 [phone]

## 2015-05-21 NOTE — ED Notes (Signed)
RN Kathlee Nations going to start an IV

## 2015-05-21 NOTE — ED Notes (Signed)
Bed: WA02 Expected date:  Expected time:  Means of arrival:  Comments: Ems- fall, hip pain

## 2015-05-21 NOTE — H&P (Signed)
[facility] Admission History and Physical       Linda [name] TIW:[id number] DOB: 1929-06-27 DOA: 05/21/2015  Referring physician: EDP PCP: [name], MD  Specialists:   Chief Complaint: Right Hip Pain after Falling  HPI: Linda [name] is a 81 y.o. female with a history of CVA with Right Hemiparesis, PAF on Eliquis Rx, Osteoporosis, Hypothyroid, who was brought to the ED after falling in her home.    She got up without assistance and usually uses a walker and a 1 person assist and has 24 hour care in her home.    She lost her balance and fell onto her left side and had increased pain of her right hip and could not bear weight.   She was found to have an acute fracture of the Right Hip and Orthopedics Dr Tomie China was consulted and requested that patient be transferred to [facility] for surgical repair in the AM.       Review of Systems:  Constitutional: No Weight Loss, No Weight Gain, Night Sweats, Fevers, Chills, Dizziness, Light Headedness, Fatigue, or Generalized Weakness HEENT: No Headaches, Difficulty Swallowing,Tooth/Dental Problems,Sore Throat,  No Sneezing, Rhinitis, Ear Ache, Nasal Congestion, or Post Nasal Drip,  Cardio-vascular:  No Chest pain, Orthopnea, PND, Edema in Lower Extremities, Anasarca, Dizziness, Palpitations  Resp: No Dyspnea, No DOE, No Productive Cough, No Non-Productive Cough, No Hemoptysis, No Wheezing.    GI: No Heartburn, Indigestion, Abdominal Pain, Nausea, Vomiting, Diarrhea, Constipation, Hematemesis, Hematochezia, Melena, Change in Bowel Habits,  Loss of Appetite  GU: No Dysuria, No Change in Color of Urine, No Urgency or Urinary Frequency, No Flank pain.  Musculoskeletal: No Joint Pain or Swelling, No Decreased Range of Motion, No Back Pain.  Neurologic: No Syncope, No Seizures, Muscle Weakness, Paresthesia, Vision Disturbance or Loss, No Diplopia, No Vertigo, No Difficulty Walking,  Skin: No Rash or Lesions. Psych: No  Change in Mood or Affect, No Depression or Anxiety, No Memory loss, No Confusion, or Hallucinations   Past Medical History  Diagnosis Date  . History of cardiac catheterization 2002    Negative  . Vertebral fracture   . Restless legs syndrome with nocturnal myoclonus 05/13/2013     [place] controlled   . Abnormal uterine bleeding 2505,3976  . Hypothyroidism   . Arthritis   . Osteoporosis   . History of stomach ulcers   . History of jaundice as a child   . Anemia   . Head pain, chronic     "I have pain in the back of my head for months"  . Dizziness     TAKES MECLIZINE TO TREAT  . Hemiparesis and speech and language deficit as late effects of stroke 02/09/2015     Past Surgical History  Procedure Laterality Date  . Back surgery      x 3  . Breast surgery Bilateral     1 lump each breast removed and benign  . Nasal sinus surgery    . Dilation and curettage of uterus  1970  . Catheterization  2003    cardiac cath  . Kyphoplasty  2010  . Cataract extraction Bilateral 2011  . Cholecystectomy  1995    Lap  . Cardiac catheterization  2002    "it was normal" per pt - no record found  . Robotic assisted salpingo oopherectomy Bilateral 11/02/2014    Procedure: ROBOTIC ASSISTED bilateral SALPINGO OOPHORECTOMY;  Surgeon: Everitt Amber, MD;  Location: [facility];  Service: Gynecology;  Laterality: Bilateral;  .  Laparoscopy N/A 11/02/2014    Procedure: LAPAROSCOPY DIAGNOSTIC;  Surgeon: Everitt Amber, MD;  Location: [facility];  Service: Gynecology;  Laterality: N/A;  . Abdominal hysterectomy        Prior to Admission medications   Medication Sig Start Date End Date Taking? Authorizing Provider  apixaban (ELIQUIS) 2.5 MG TABS tablet Take 1 tablet (2.5 mg total) by mouth 2 (two) times daily. 03/25/15  Yes Ivan Anchors Love, PA-C  calcium gluconate 500 MG tablet Take 500 mg by mouth 2 (two) times daily. With 1000 units D3   Yes Historical Provider, MD  cholecalciferol (VITAMIN D) 1000 UNITS tablet Take  1,000 Units by mouth 2 (two) times daily. Take with calcium   Yes Historical Provider, MD  denosumab (PROLIA) 60 MG/ML SOLN injection Inject 60 mg into the skin every 6 (six) months. Administer in upper arm, thigh, or abdomen   Yes Historical Provider, MD  diphenhydrAMINE (BENADRYL) 25 MG tablet Take 25 mg by mouth daily as needed for sleep.   Yes Historical Provider, MD  ELIQUIS 5 MG TABS tablet Take 5 mg by mouth 2 (two) times daily. 05/12/15  Yes Historical Provider, MD  hyoscyamine (ANASPAZ) 0.125 MG TBDP disintergrating tablet Place 0.125 mg under the tongue 2 (two) times daily as needed for bladder spasms.   Yes Historical Provider, MD  levothyroxine (SYNTHROID, LEVOTHROID) 50 MCG tablet Take 1 tablet (50 mcg total) by mouth every morning. 03/25/15  Yes Ivan Anchors Love, PA-C  metoprolol tartrate (LOPRESSOR) 25 MG tablet Take 1 tablet (25 mg total) by mouth 2 (two) times daily. 03/25/15  Yes Ivan Anchors Love, PA-C  pantoprazole sodium (PROTONIX) 40 mg/20 mL PACK Take 20 mLs (40 mg total) by mouth daily. 03/30/15  Yes [name], MD  Propylene Glycol (SYSTANE BALANCE OP) Apply 1 drop to eye 2 (two) times daily.   Yes Historical Provider, MD  sertraline (ZOLOFT) 50 MG tablet Take 50 mg by mouth daily. 05/10/15  Yes Historical Provider, MD  acetaminophen (TYLENOL) 500 MG tablet Take 1 tablet (500 mg total) by mouth every 6 (six) hours as needed for mild pain. Usually only takes 2 x a  day 03/25/15   Ivan Anchors Love, PA-C  amitriptyline (ELAVIL) 10 MG tablet Take 10 mg by mouth at bedtime. 04/26/15   Historical Provider, MD  methocarbamol (ROBAXIN) 500 MG tablet Take 1 tablet (500 mg total) by mouth 3 (three) times daily. 03/25/15   Bary Leriche, PA-C  sennosides-docusate sodium (SENOKOT-S) 8.6-50 MG tablet Take 2 tablets by mouth daily as needed for constipation. 03/25/15   Bary Leriche, PA-C  tolterodine (DETROL LA) 4 MG 24 hr capsule Take 4 mg by mouth daily. 05/10/15   Historical Provider, MD      Allergies  Allergen Reactions  . Ibuprofen Anaphylaxis    Passed out and had to be hospitalized  . Lyrica [Pregabalin] Other (See Comments)    "made me a zombie'  . Penicillins Other (See Comments)    Skin on head was crawling and very very sleepy  . Demerol [Meperidine Hcl] Nausea And Vomiting  . Dilaudid [Hydromorphone Hcl] Nausea And Vomiting  . Morphine And Related Nausea And Vomiting  . Percocet [Oxycodone-Acetaminophen] Nausea And Vomiting  . Vicodin [Hydrocodone-Acetaminophen] Nausea And Vomiting  . Fentanyl Other (See Comments)    unknown  . Lidoderm [Lidocaine] Other (See Comments)    unknown  . Other Other (See Comments)    hamburger  . Tramadol Other (See Comments)  unknown    Social History:  reports that she has never smoked. She has never used smokeless tobacco. She reports that she does not drink alcohol or use illicit drugs.    Family History  Problem Relation Age of Onset  . Asthma Brother   . Osteoarthritis Son   . Hypertension Father   . Stroke Father   . Cancer Brother     stomach       Physical Exam:  GEN:  Pleasant Thin Elderly  81 y.o. Caucasian female examined and in no acute distress; cooperative with exam Filed Vitals:   05/21/15 1747 05/21/15 1754 05/21/15 2024 05/21/15 2030  BP:  163/71 163/74 155/72  Pulse:  104 75 70  Temp:  97.4 F (36.3 C)    TempSrc:  Oral    Resp:  20 27 30   SpO2: 98% 95% 97% 100%   Blood pressure 155/72, pulse 70, temperature 97.4 F (36.3 C), temperature source Oral, resp. rate 30, last menstrual period 09/03/1996, SpO2 100 %. PSYCH: She is alert and oriented x1does not appear anxious does not appear depressed; affect is normal HEENT: Normocephalic and Atraumatic, Mucous membranes pink; PERRLA; EOM intact; Fundi:  Benign;  No scleral icterus, Nares: Patent, Oropharynx: Clear,    Neck:  FROM, No Cervical Lymphadenopathy nor Thyromegaly or Carotid Bruit; No JVD; Breasts:: Not examined CHEST WALL: No  tenderness CHEST: Normal respiration, clear to auscultation bilaterally HEART: Regular rate and rhythm; no murmurs rubs or gallops BACK: No kyphosis or scoliosis; No CVA tenderness ABDOMEN: Positive Bowel Sounds, Scaphoid, Soft Non-Tender, No Rebound or Guarding; No Masses, No Organomegaly Rectal Exam: Not done EXTREMITIES: No Cyanosis, Clubbing, or Edema; No Ulcerations. Genitalia: not examined PULSES: 2+ and symmetric SKIN: Normal hydration no rash or ulceration CNS:  Alert and Oriented x1, No Focal Deficits Vascular: pulses palpable throughout    Labs on Admission:  Basic Metabolic Panel:  Recent Labs Lab 05/20/15 1825 05/21/15 1900  NA 135 138  K 3.2* 3.2*  CL 98* 100*  CO2 30 30  GLUCOSE 102* 122*  BUN 15 17  CREATININE 1.01* 0.99  CALCIUM 10.6* 10.5*   Liver Function Tests:  Recent Labs Lab 05/21/15 1900  AST 67*  ALT 71*  ALKPHOS 163*  BILITOT 0.5  PROT 7.0  ALBUMIN 3.8   No results for input(s): LIPASE, AMYLASE in the last 168 hours. No results for input(s): AMMONIA in the last 168 hours. CBC:  Recent Labs Lab 05/20/15 1825 05/21/15 1900  WBC 7.9 9.8  NEUTROABS 5.3 7.8*  HGB 12.5 12.4  HCT 39.3 38.3  MCV 93.8 93.2  PLT 276 302   Cardiac Enzymes: No results for input(s): CKTOTAL, CKMB, CKMBINDEX, TROPONINI in the last 168 hours.  BNP (last 3 results) No results for input(s): BNP in the last 8760 hours.  ProBNP (last 3 results) No results for input(s): PROBNP in the last 8760 hours.  CBG: No results for input(s): GLUCAP in the last 168 hours.  Radiological Exams on Admission: Dg Chest 1 View  05/21/2015   CLINICAL DATA:  81 year old female with fall  EXAM: CHEST  1 VIEW  COMPARISON:  Radiograph dated 03/14/2015  FINDINGS: Single-view of the chest demonstrates emphysematous changes of the lungs. There is a focal area of opacity at the left lower lung field likely combination of small pleural effusion and subsegmental atelectatic changes of  the adjacent lung. Superimposed pneumonia is not excluded. There is no pneumothorax. The cardiac silhouette is within normal limits. There is  diffuse osteopenia with scoliosis and degenerative changes of the thoracic spine. Stable mid thoracic vertebroplasty changes noted. No definite acute fracture identified.  IMPRESSION: Small left pleural effusion and associated subsegmental atelectasis of the adjacent lung. No focal  Osteopenia with degenerative changes of the spine. No definite acute fracture. No pneumothorax.   Electronically Signed   By: Anner Crete M.D.   On: 05/21/2015 19:35   Ct Head Wo Contrast  05/20/2015   CLINICAL DATA:  Progressive right-sided weakness for 10 days.  EXAM: CT HEAD WITHOUT CONTRAST  TECHNIQUE: Contiguous axial images were obtained from the base of the skull through the vertex without intravenous contrast.  COMPARISON:  CT scan 03/14/2015  FINDINGS: Stable age related cerebral atrophy, ventriculomegaly and periventricular white matter disease. No extra-axial fluid collections are identified. Remote left parietal infarction is noted with encephalomalacia. There is also a remote left occipital infarct. No CT findings for acute hemispheric infarction or intracranial hemorrhage. No mass lesions. The brainstem and cerebellum are normal.  The bony structures are intact. No bone lesions or sinus disease. The globes are intact.  IMPRESSION: Stable age related cerebral atrophy, ventriculomegaly and periventricular white matter disease.  Remote infarctions.  No acute intracranial findings.   Electronically Signed   By: Marijo Sanes M.D.   On: 05/20/2015 17:46   Dg Hip Unilat With Pelvis 2-3 Views Right  05/21/2015   CLINICAL DATA:  Fall.  Right hip pain.  Pt had just come out of shower and caregiver was assisting with getting dressed. Pt reports legs got "twisted up" and fell, denies LOC, or hitting head. No pain other than hip, some dementia. Hx of breast surgery, cardiac  catheterization.  EXAM: DG HIP (WITH OR WITHOUT PELVIS) 2-3V RIGHT  COMPARISON:  None.  FINDINGS: There is a fracture of the right femoral neck. Fracture is mid cervical. No significant comminution. There is displacement, with the distal fracture component retracting superiorly, displacing by approximately 1.8 cm. There is also varus angulation.  Right femoral head is normally aligned in the acetabulum.  Bony pelvis is intact. Left hip joint is normally spaced and aligned. Bones are diffusely demineralized.  IMPRESSION: Displaced fracture of the right femoral neck with varus angulation.   Electronically Signed   By: Lajean Manes M.D.   On: 05/21/2015 19:57     EKG: Independently reviewed. Rate controlled Atrial Fibrillation,  Estimated rate at 60    Assessment/Plan:  81 y.o. female with  Principal Problem:   1.    Closed right hip fracture/Hip fracture requiring operative repair   Orthopedics Surgery Dr. Tomie China   Pain Control with    Active Problems:   2.    Paroxysmal atrial fibrillation   Continue Metoprolol Rx   Hold Eliquis Rx     3.     Long-term (current) use of anticoagulants   On Eliquis Rx   Hold for Surgery    4.     Hypokalemia   Replace K+    Check Magnesium    5.     Hypothyroid   Continue Levothyroxine   Check TSH    6.    Essential hypertension   Continue Metoprolol Rx     7.   Protein-calorie malnutrition, severe   Nutrition consult    8.   Hemiparesis and speech and language deficit as late effects of stroke   Chronic    9.    DVT Prophylaxis   SCDs     Code Status:  DO NOT RESUSCITATE (DNR)      Family Communication:   Son at Bedside with Caretaker present    Disposition Plan:    Inpatient Status        Time spent:  [place] [facility] Pager [phone]   If [place] Please Contact the Day Rounding Team MD for [facility]  If 7PM-7AM, Please Contact Night-Floor Coverage   [email or web address] Password [email or web address] 05/21/2015, 9:35 PM     ADDENDUM:   Patient was seen and examined on 05/21/2015

## 2015-05-22 ENCOUNTER — Encounter (HOSPITAL_COMMUNITY): Payer: Self-pay

## 2015-05-22 DIAGNOSIS — S72001E Fracture of unspecified part of neck of right femur, subsequent encounter for open fracture type I or II with routine healing: Secondary | ICD-10-CM

## 2015-05-22 DIAGNOSIS — I63012 Cerebral infarction due to thrombosis of left vertebral artery: Secondary | ICD-10-CM

## 2015-05-22 LAB — CBC
HEMATOCRIT: 36.7 % (ref 36.0–46.0)
HEMOGLOBIN: 12 g/dL (ref 12.0–15.0)
MCH: 30.7 pg (ref 26.0–34.0)
MCHC: 32.7 g/dL (ref 30.0–36.0)
MCV: 93.9 fL (ref 78.0–100.0)
Platelets: 261 10*3/uL (ref 150–400)
RBC: 3.91 MIL/uL (ref 3.87–5.11)
RDW: 14.1 % (ref 11.5–15.5)
WBC: 12.1 10*3/uL — AB (ref 4.0–10.5)

## 2015-05-22 LAB — BASIC METABOLIC PANEL
ANION GAP: 7 (ref 5–15)
BUN: 12 mg/dL (ref 6–20)
CALCIUM: 10 mg/dL (ref 8.9–10.3)
CO2: 26 mmol/L (ref 22–32)
Chloride: 105 mmol/L (ref 101–111)
Creatinine, Ser: 0.9 mg/dL (ref 0.44–1.00)
GFR, EST NON AFRICAN AMERICAN: 57 mL/min — AB (ref >60)
GLUCOSE: 131 mg/dL — AB (ref 65–99)
POTASSIUM: 3.4 mmol/L — AB (ref 3.5–5.1)
Sodium: 138 mmol/L (ref 135–145)

## 2015-05-22 LAB — MRSA PCR SCREENING: MRSA BY PCR: NEGATIVE

## 2015-05-22 MED ORDER — METOPROLOL TARTRATE 25 MG PO TABS
25.0000 mg | ORAL_TABLET | Freq: Two times a day (BID) | ORAL | Status: DC
  Administered 2015-05-22 – 2015-05-25 (×6): 25 mg via ORAL
  Filled 2015-05-22 (×9): qty 1

## 2015-05-22 MED ORDER — LEVOTHYROXINE SODIUM 50 MCG PO TABS
50.0000 ug | ORAL_TABLET | Freq: Every day | ORAL | Status: DC
  Administered 2015-05-22: 50 ug via ORAL
  Filled 2015-05-22: qty 1

## 2015-05-22 MED ORDER — HYOSCYAMINE SULFATE 0.125 MG PO TBDP
0.1250 mg | ORAL_TABLET | Freq: Two times a day (BID) | ORAL | Status: DC | PRN
  Filled 2015-05-22: qty 1

## 2015-05-22 MED ORDER — ALUM & MAG HYDROXIDE-SIMETH 200-200-20 MG/5ML PO SUSP: 30.0000 mL | Freq: Four times a day (QID) | ORAL | Status: DC | PRN

## 2015-05-22 MED ORDER — ONDANSETRON HCL 4 MG PO TABS: 4.0000 mg | ORAL_TABLET | Freq: Four times a day (QID) | ORAL | Status: DC | PRN

## 2015-05-22 MED ORDER — DIPHENHYDRAMINE HCL 25 MG PO TABS: 25.0000 mg | ORAL_TABLET | Freq: Every day | ORAL | Status: DC | PRN

## 2015-05-22 MED ORDER — ACETAMINOPHEN 325 MG PO TABS
650.0000 mg | ORAL_TABLET | Freq: Four times a day (QID) | ORAL | Status: DC | PRN
  Administered 2015-05-22 – 2015-05-25 (×2): 650 mg via ORAL
  Filled 2015-05-22: qty 2

## 2015-05-22 MED ORDER — AMITRIPTYLINE HCL 10 MG PO TABS
10.0000 mg | ORAL_TABLET | Freq: Every day | ORAL | Status: DC
  Administered 2015-05-22 – 2015-05-24 (×3): 10 mg via ORAL
  Filled 2015-05-22 (×5): qty 1

## 2015-05-22 MED ORDER — VITAMIN D 1000 UNITS PO TABS
1000.0000 [IU] | ORAL_TABLET | Freq: Two times a day (BID) | ORAL | Status: DC
  Administered 2015-05-22 – 2015-05-25 (×5): 1000 [IU] via ORAL
  Filled 2015-05-22 (×5): qty 1

## 2015-05-22 MED ORDER — CETYLPYRIDINIUM CHLORIDE 0.05 % MT LIQD
7.0000 mL | Freq: Two times a day (BID) | OROMUCOSAL | Status: DC
  Administered 2015-05-22 – 2015-05-25 (×5): 7 mL via OROMUCOSAL

## 2015-05-22 MED ORDER — POLYVINYL ALCOHOL 1.4 % OP SOLN
1.0000 [drp] | Freq: Two times a day (BID) | OPHTHALMIC | Status: DC
  Administered 2015-05-22 – 2015-05-25 (×7): 1 [drp] via OPHTHALMIC
  Filled 2015-05-22: qty 15

## 2015-05-22 MED ORDER — ONDANSETRON HCL 4 MG/2ML IJ SOLN: 4.0000 mg | Freq: Four times a day (QID) | INTRAMUSCULAR | Status: DC | PRN

## 2015-05-22 MED ORDER — ACETAMINOPHEN 650 MG RE SUPP: 650.0000 mg | Freq: Four times a day (QID) | RECTAL | Status: DC | PRN

## 2015-05-22 MED ORDER — SODIUM CHLORIDE 0.9 % IV SOLN
INTRAVENOUS | Status: AC
  Administered 2015-05-22: 03:00:00 via INTRAVENOUS

## 2015-05-22 MED ORDER — FESOTERODINE FUMARATE ER 4 MG PO TB24
4.0000 mg | ORAL_TABLET | Freq: Every day | ORAL | Status: DC
  Administered 2015-05-22 – 2015-05-25 (×3): 4 mg via ORAL
  Filled 2015-05-22 (×4): qty 1

## 2015-05-22 MED ORDER — SODIUM CHLORIDE 0.9 % IJ SOLN
3.0000 mL | Freq: Two times a day (BID) | INTRAMUSCULAR | Status: DC
  Administered 2015-05-22 – 2015-05-24 (×4): 3 mL via INTRAVENOUS

## 2015-05-22 MED ORDER — ZOLPIDEM TARTRATE 5 MG PO TABS: 5.0000 mg | ORAL_TABLET | Freq: Every evening | ORAL | Status: DC | PRN

## 2015-05-22 MED ORDER — CALCIUM CARBONATE 1250 (500 CA) MG PO TABS
1250.0000 mg | ORAL_TABLET | Freq: Two times a day (BID) | ORAL | Status: DC
  Administered 2015-05-22 – 2015-05-25 (×5): 1250 mg via ORAL
  Filled 2015-05-22 (×4): qty 3

## 2015-05-22 MED ORDER — SENNOSIDES-DOCUSATE SODIUM 8.6-50 MG PO TABS: 2.0000 | ORAL_TABLET | Freq: Every day | ORAL | Status: DC | PRN

## 2015-05-22 MED ORDER — HYDROMORPHONE HCL 1 MG/ML IJ SOLN
0.5000 mg | INTRAMUSCULAR | Status: DC | PRN
  Administered 2015-05-22 – 2015-05-25 (×9): 0.5 mg via INTRAVENOUS
  Filled 2015-05-22 (×9): qty 1

## 2015-05-22 MED ORDER — SERTRALINE HCL 50 MG PO TABS
50.0000 mg | ORAL_TABLET | Freq: Every day | ORAL | Status: DC
  Administered 2015-05-22 – 2015-05-25 (×3): 50 mg via ORAL
  Filled 2015-05-22 (×4): qty 1

## 2015-05-22 MED ORDER — HEPARIN (PORCINE) IN NACL 100-0.45 UNIT/ML-% IJ SOLN
800.0000 [IU]/h | INTRAMUSCULAR | Status: DC
  Administered 2015-05-22: 650 [IU]/h via INTRAVENOUS
  Filled 2015-05-22: qty 250

## 2015-05-22 MED ORDER — PANTOPRAZOLE SODIUM 40 MG PO PACK
40.0000 mg | PACK | Freq: Every day | ORAL | Status: DC
  Administered 2015-05-22 – 2015-05-25 (×2): 40 mg via ORAL
  Filled 2015-05-22 (×4): qty 20

## 2015-05-22 MED ORDER — POTASSIUM CHLORIDE 10 MEQ/100ML IV SOLN
10.0000 meq | INTRAVENOUS | Status: AC
  Administered 2015-05-22 (×2): 10 meq via INTRAVENOUS
  Filled 2015-05-22 (×2): qty 100

## 2015-05-22 MED ORDER — LORAZEPAM 2 MG/ML IJ SOLN
0.5000 mg | Freq: Four times a day (QID) | INTRAMUSCULAR | Status: DC | PRN
  Administered 2015-05-22: 0.5 mg via INTRAVENOUS
  Filled 2015-05-22: qty 1

## 2015-05-22 MED ORDER — CEFAZOLIN SODIUM-DEXTROSE 2-3 GM-% IV SOLR
2.0000 g | INTRAVENOUS | Status: AC
  Administered 2015-05-23: 1 g via INTRAVENOUS
  Filled 2015-05-22: qty 50

## 2015-05-22 NOTE — Progress Notes (Signed)
[facility] PROGRESS NOTE  Linda [name] TMH:[id number] DOB: 1975/10/30 DOA: 05/21/2015 PCP: [name], MD  Assessment/Plan:  1. Closed right hip fracture/Hip fracture requiring operative repair -Orthopedic Surgery consulted -Plans for surgery on 9/19, after holding eliquis    2. Paroxysmal atrial fibrillation - Continue Metoprolol Rx - Hold Eliquis - Pt has hx of CVA, thus would cont on heparin gtt. Discussed with Orthopedic Surgery who recommends stopping heparin gtt 8hrs before surgery (tentatively planned for 3pm on 9/19)   3. Long-term (current) use of anticoagulants -On Eliquis Rx prior to admit, held - Will continue heparin gtt per above  4. Hypokalemia - Replace  5. Hypothyroid -Continue Levothyroxine -Check TSH  6. Essential hypertension -Continue Metoprolol as tolerated   7. Protein-calorie malnutrition, severe -Nutrition consult  8. Hemiparesis and speech and language deficit as late effects of stroke -Chronic, stable  9. DVT Prophylaxis  Code Status: DNR Family Communication: Pt in room, family at bedside Disposition Plan: Pending  Consultants:  Orthopedic Surgery  Procedures:    Antibiotics:    HPI/Subjective: Complains of hip pain  Objective: Filed Vitals:   05/22/15 0603 05/22/15 0819 05/22/15 1242 05/22/15 1547  BP: 157/75 145/79 108/65 133/80  Pulse: 72 73 62 75  Temp: 99.4 F (37.4 C) 99.5 F (37.5 C) 96.7 F (35.9 C) 98.6 F (37 C)  TempSrc: Oral Axillary Axillary   Resp: 18 16 16 16   Height:      Weight:      SpO2: 99% 97% 99% 97%    Intake/Output Summary (Last 24 hours) at 05/22/15 1638 Last data filed at 05/22/15 1100  Gross per 24 hour  Intake    240 ml  Output   1800 ml  Net  -1560 ml   Filed Weights   05/21/15 2159  Weight: 45.36 kg (100 lb)    Exam:   General:  Awake, in nad  Cardiovascular: regular, s1,  s2  Respiratory: normal resp effort, no wheezing  Abdomen: soft,nondistended  Musculoskeletal: perfused, no clubbing   Data Reviewed: Basic Metabolic Panel:  Recent Labs Lab 05/20/15 1825 05/21/15 1900 05/22/15 0556  NA 135 138 138  K 3.2* 3.2* 3.4*  CL 98* 100* 105  CO2 30 30 26   GLUCOSE 102* 122* 131*  BUN 15 17 12   CREATININE 1.01* 0.99 0.90  CALCIUM 10.6* 10.5* 10.0   Liver Function Tests:  Recent Labs Lab 05/21/15 1900  AST 67*  ALT 71*  ALKPHOS 163*  BILITOT 0.5  PROT 7.0  ALBUMIN 3.8   No results for input(s): LIPASE, AMYLASE in the last 168 hours. No results for input(s): AMMONIA in the last 168 hours. CBC:  Recent Labs Lab 05/20/15 1825 05/21/15 1900 05/22/15 0556  WBC 7.9 9.8 12.1*  NEUTROABS 5.3 7.8*  --   HGB 12.5 12.4 12.0  HCT 39.3 38.3 36.7  MCV 93.8 93.2 93.9  PLT 276 302 261   Cardiac Enzymes: No results for input(s): CKTOTAL, CKMB, CKMBINDEX, TROPONINI in the last 168 hours. BNP (last 3 results) No results for input(s): BNP in the last 8760 hours.  ProBNP (last 3 results) No results for input(s): PROBNP in the last 8760 hours.  CBG: No results for input(s): GLUCAP in the last 168 hours.  Recent Results (from the past 240 hour(s))  MRSA PCR Screening     Status: None   Collection Time: 05/22/15  3:33 AM  Result Value Ref Range Status   MRSA by PCR NEGATIVE NEGATIVE Final  Comment:        The GeneXpert MRSA Assay (FDA approved for NASAL specimens only), is one component of a comprehensive MRSA colonization surveillance program. It is not intended to diagnose MRSA infection nor to guide or monitor treatment for MRSA infections.      Studies: Dg Chest 1 View  05/21/2015   CLINICAL DATA:  81 year old female with fall  EXAM: CHEST  1 VIEW  COMPARISON:  Radiograph dated 03/14/2015  FINDINGS: Single-view of the chest demonstrates emphysematous changes of the lungs. There is a focal area of opacity at the left lower lung  field likely combination of small pleural effusion and subsegmental atelectatic changes of the adjacent lung. Superimposed pneumonia is not excluded. There is no pneumothorax. The cardiac silhouette is within normal limits. There is diffuse osteopenia with scoliosis and degenerative changes of the thoracic spine. Stable mid thoracic vertebroplasty changes noted. No definite acute fracture identified.  IMPRESSION: Small left pleural effusion and associated subsegmental atelectasis of the adjacent lung. No focal  Osteopenia with degenerative changes of the spine. No definite acute fracture. No pneumothorax.   Electronically Signed   By: Anner Crete M.D.   On: 05/21/2015 19:35   Ct Head Wo Contrast  05/20/2015   CLINICAL DATA:  Progressive right-sided weakness for 10 days.  EXAM: CT HEAD WITHOUT CONTRAST  TECHNIQUE: Contiguous axial images were obtained from the base of the skull through the vertex without intravenous contrast.  COMPARISON:  CT scan 03/14/2015  FINDINGS: Stable age related cerebral atrophy, ventriculomegaly and periventricular white matter disease. No extra-axial fluid collections are identified. Remote left parietal infarction is noted with encephalomalacia. There is also a remote left occipital infarct. No CT findings for acute hemispheric infarction or intracranial hemorrhage. No mass lesions. The brainstem and cerebellum are normal.  The bony structures are intact. No bone lesions or sinus disease. The globes are intact.  IMPRESSION: Stable age related cerebral atrophy, ventriculomegaly and periventricular white matter disease.  Remote infarctions.  No acute intracranial findings.   Electronically Signed   By: Marijo Sanes M.D.   On: 05/20/2015 17:46   Dg Hip Unilat With Pelvis 2-3 Views Right  05/21/2015   CLINICAL DATA:  Fall.  Right hip pain.  Pt had just come out of shower and caregiver was assisting with getting dressed. Pt reports legs got "twisted up" and fell, denies LOC, or  hitting head. No pain other than hip, some dementia. Hx of breast surgery, cardiac catheterization.  EXAM: DG HIP (WITH OR WITHOUT PELVIS) 2-3V RIGHT  COMPARISON:  None.  FINDINGS: There is a fracture of the right femoral neck. Fracture is mid cervical. No significant comminution. There is displacement, with the distal fracture component retracting superiorly, displacing by approximately 1.8 cm. There is also varus angulation.  Right femoral head is normally aligned in the acetabulum.  Bony pelvis is intact. Left hip joint is normally spaced and aligned. Bones are diffusely demineralized.  IMPRESSION: Displaced fracture of the right femoral neck with varus angulation.   Electronically Signed   By: Lajean Manes M.D.   On: 05/21/2015 19:57    Scheduled Meds: . amitriptyline  10 mg Oral QHS  . antiseptic oral rinse  7 mL Mouth Rinse BID  . calcium carbonate  1,250 mg Oral BID WC  . [START ON 05/23/2015]  ceFAZolin (ANCEF) IV  2 g Intravenous To SS-Surg  . cholecalciferol  1,000 Units Oral BID WC  . fesoterodine  4 mg Oral Daily  . levothyroxine  50 mcg Oral QAC breakfast  . metoprolol tartrate  25 mg Oral BID  . pantoprazole sodium  40 mg Oral Daily  . polyvinyl alcohol  1 drop Both Eyes BID  . sertraline  50 mg Oral Daily  . sodium chloride  3 mL Intravenous Q12H   Continuous Infusions:   Principal Problem:   Closed right hip fracture Active Problems:   Essential hypertension   Hypothyroidism   Protein-calorie malnutrition, severe   Paroxysmal atrial fibrillation   Long-term (current) use of anticoagulants   Hemiparesis and speech and language deficit as late effects of stroke   CVA (cerebral infarction)   Hip fracture requiring operative repair   Hypokalemia   [facility], [place] [facility] Pager [phone]. If 7PM-7AM, please contact night-coverage at [email or web address], password [facility] 05/22/2015, 4:38 PM  LOS: 1 day

## 2015-05-22 NOTE — Progress Notes (Signed)
ANTICOAGULATION CONSULT NOTE - Initial Consult  Pharmacy Consult for heparin Indication: atrial fibrillation  Allergies  Allergen Reactions  . Ibuprofen Anaphylaxis    Passed out and had to be hospitalized  . Lyrica [Pregabalin] Other (See Comments)    "made me a zombie'  . Penicillins Other (See Comments)    Skin on head was crawling and very very sleepy  . Demerol [Meperidine Hcl] Nausea And Vomiting  . Dilaudid [Hydromorphone Hcl] Nausea And Vomiting  . Morphine And Related Nausea And Vomiting  . Percocet [Oxycodone-Acetaminophen] Nausea And Vomiting  . Vicodin [Hydrocodone-Acetaminophen] Nausea And Vomiting  . Fentanyl Other (See Comments)    unknown  . Lidoderm [Lidocaine] Other (See Comments)    unknown  . Other Other (See Comments)    hamburger  . Tramadol Other (See Comments)    unknown    Patient Measurements: Height: 5\' 2"  (157.5 cm) Weight: 100 lb (45.36 kg) IBW/kg (Calculated) : 50.1 Heparin Dosing Weight: 45.4 kg  Vital Signs: Temp: 98.6 F (37 C) (09/18 1547) Temp Source: Axillary (09/18 1242) BP: 133/80 mmHg (09/18 1547) Pulse Rate: 75 (09/18 1547)  Labs:  Recent Labs  05/20/15 1825 05/21/15 1900 05/22/15 0556  HGB 12.5 12.4 12.0  HCT 39.3 38.3 36.7  PLT 276 302 261  LABPROT  --  14.1  --   INR  --  1.07  --   CREATININE 1.01* 0.99 0.90    Estimated Creatinine Clearance: 33.3 mL/min (by C-G formula based on Cr of 0.9).   Medical History: Past Medical History  Diagnosis Date  . History of cardiac catheterization 2002    Negative  . Vertebral fracture   . Restless legs syndrome with nocturnal myoclonus 05/13/2013     requip controlled   . Abnormal uterine bleeding 9509,3267  . Hypothyroidism   . Arthritis   . Osteoporosis   . History of stomach ulcers   . History of jaundice as a child   . Anemia   . Head pain, chronic     "I have pain in the back of my head for months"  . Dizziness     TAKES MECLIZINE TO TREAT  . Hemiparesis  and speech and language deficit as late effects of stroke 02/09/2015  . Paroxysmal atrial fibrillation     Medications:  Prescriptions prior to admission  Medication Sig Dispense Refill Last Dose  . acetaminophen (TYLENOL) 500 MG tablet Take 1 tablet (500 mg total) by mouth every 6 (six) hours as needed for mild pain. Usually only takes 2 x a  day 30 tablet 0 Taking  . apixaban (ELIQUIS) 2.5 MG TABS tablet Take 1 tablet (2.5 mg total) by mouth 2 (two) times daily. 60 tablet 1   . calcium gluconate 500 MG tablet Take 500 mg by mouth 2 (two) times daily. With 1000 units D3   05/20/2015 at Unknown time  . cholecalciferol (VITAMIN D) 1000 UNITS tablet Take 1,000 Units by mouth 2 (two) times daily. Take with calcium   05/20/2015 at Unknown time  . denosumab (PROLIA) 60 MG/ML SOLN injection Inject 60 mg into the skin every 6 (six) months. Administer in upper arm, thigh, or abdomen   6 MONTHS  . diphenhydrAMINE (BENADRYL) 25 MG tablet Take 25 mg by mouth daily as needed for sleep.     . hyoscyamine (ANASPAZ) 0.125 MG TBDP disintergrating tablet Place 0.125 mg under the tongue 2 (two) times daily as needed for bladder spasms.   Past Month at Unknown time  .  levothyroxine (SYNTHROID, LEVOTHROID) 50 MCG tablet Take 1 tablet (50 mcg total) by mouth every morning. 30 tablet 1 05/20/2015 at Unknown time  . metoprolol tartrate (LOPRESSOR) 25 MG tablet Take 1 tablet (25 mg total) by mouth 2 (two) times daily. 30 tablet 1 05/21/2015 at 0945  . pantoprazole sodium (PROTONIX) 40 mg/20 mL PACK Take 20 mLs (40 mg total) by mouth daily. 30 each 1 05/20/2015 at Unknown time  . Propylene Glycol (SYSTANE BALANCE OP) Apply 1 drop to eye 2 (two) times daily.   05/20/2015 at Unknown time  . sertraline (ZOLOFT) 50 MG tablet Take 50 mg by mouth daily.  3 05/20/2015 at Unknown time  . amitriptyline (ELAVIL) 10 MG tablet Take 10 mg by mouth at bedtime.  6 05/19/2015 at Unknown time  . methocarbamol (ROBAXIN) 500 MG tablet Take 1 tablet  (500 mg total) by mouth 3 (three) times daily. 90 tablet 1 Taking  . sennosides-docusate sodium (SENOKOT-S) 8.6-50 MG tablet Take 2 tablets by mouth daily as needed for constipation.   Taking  . tolterodine (DETROL LA) 4 MG 24 hr capsule Take 4 mg by mouth daily.  3 05/20/2015 at Unknown time    Assessment: 81 yo female with pAFib on Eliquis PTA, last dose was yesterday morning.  Pt is admitted with R hip fracture, requiring surgery, tentatively planned for tomorrow. Will begin heparin gtt now and plan to stop tomorrow morning, 8 hours prior to surgery.  Goal of Therapy:  Heparin level 0.3-0.7 units/ml Monitor platelets by anticoagulation protocol: Yes   Plan:  -Heparin 650 units/hr -Check first aptt tonight -Daily HL, CBC, aptt -Stop heparin 8 hours before surgery    Hughes Better, PharmD, BCPS Clinical Pharmacist Pager: [phone] 05/22/2015 4:46 PM

## 2015-05-22 NOTE — ED Notes (Signed)
[facility] here to transport pt to [facility]. 

## 2015-05-22 NOTE — Progress Notes (Signed)
Utilization Review Completed.Dowell, Deborah T9/18/2016  

## 2015-05-22 NOTE — Consult Note (Signed)
ORTHOPAEDIC CONSULTATION  REQUESTING PHYSICIAN: Donne Hazel, MD  Chief Complaint: Right hip fx  HPI: Linda [name] is a 81 y.o. female who presents with right hip fx s/p mechanical fall at home.  Walks with walker at baseline.  Household ambulator.  Endorses severe pain in right hip that is constant, worse with movement of RLE, does not radiate, sharp stabbing in quality.  Denies LOC, f/c/n/v.  Past Medical History  Diagnosis Date  . History of cardiac catheterization 2002    Negative  . Vertebral fracture   . Restless legs syndrome with nocturnal myoclonus 05/13/2013     requip controlled   . Abnormal uterine bleeding [id number]  . Hypothyroidism   . Arthritis   . Osteoporosis   . History of stomach ulcers   . History of jaundice as a child   . Anemia   . Head pain, chronic     "I have pain in the back of my head for months"  . Dizziness     TAKES MECLIZINE TO TREAT  . Hemiparesis and speech and language deficit as late effects of stroke 02/09/2015  . Paroxysmal atrial fibrillation    Past Surgical History  Procedure Laterality Date  . Back surgery      x 3  . Breast surgery Bilateral     1 lump each breast removed and benign  . Nasal sinus surgery    . Dilation and curettage of uterus  1970  . Catheterization  2003    cardiac cath  . Kyphoplasty  2010  . Cataract extraction Bilateral 2011  . Cholecystectomy  1995    Lap  . Cardiac catheterization  2002    "it was normal" per pt - no record found  . Robotic assisted salpingo oopherectomy Bilateral 11/02/2014    Procedure: ROBOTIC ASSISTED bilateral SALPINGO OOPHORECTOMY;  Surgeon: Everitt Amber, MD;  Location: [facility];  Service: Gynecology;  Laterality: Bilateral;  . Laparoscopy N/A 11/02/2014    Procedure: LAPAROSCOPY DIAGNOSTIC;  Surgeon: Everitt Amber, MD;  Location: [facility];  Service: Gynecology;  Laterality: N/A;  . Abdominal hysterectomy     Social History   Social History  . Marital Status: Widowed    Spouse  Name: N/A  . Number of Children: 3  . Years of Education: some coll.   Occupational History  . retired     Primary school teacher position   Social History Main Topics  . Smoking status: Never Smoker   . Smokeless tobacco: Never Used  . Alcohol Use: No  . Drug Use: No  . Sexual Activity: No   Other Topics Concern  . None   Social History Narrative   Patient is widowed with 3 children.   Patient is right handed.   Patient has some college education.   Patient drinks 1-2 cups some days of the week.   Family History  Problem Relation Age of Onset  . Asthma Brother   . Osteoarthritis Son   . Hypertension Father   . Stroke Father   . Cancer Brother     stomach   Allergies  Allergen Reactions  . Ibuprofen Anaphylaxis    Passed out and had to be hospitalized  . Lyrica [Pregabalin] Other (See Comments)    "made me a zombie'  . Penicillins Other (See Comments)    Skin on head was crawling and very very sleepy  . Demerol [Meperidine Hcl] Nausea And Vomiting  . Dilaudid [Hydromorphone Hcl] Nausea And Vomiting  .  Morphine And Related Nausea And Vomiting  . Percocet [Oxycodone-Acetaminophen] Nausea And Vomiting  . Vicodin [Hydrocodone-Acetaminophen] Nausea And Vomiting  . Fentanyl Other (See Comments)    unknown  . Lidoderm [Lidocaine] Other (See Comments)    unknown  . Other Other (See Comments)    hamburger  . Tramadol Other (See Comments)    unknown   Prior to Admission medications   Medication Sig Start Date End Date Taking? Authorizing Provider  apixaban (ELIQUIS) 2.5 MG TABS tablet Take 1 tablet (2.5 mg total) by mouth 2 (two) times daily. 03/25/15  Yes Ivan Anchors Love, PA-C  calcium gluconate 500 MG tablet Take 500 mg by mouth 2 (two) times daily. With 1000 units D3   Yes Historical Provider, MD  cholecalciferol (VITAMIN D) 1000 UNITS tablet Take 1,000 Units by mouth 2 (two) times daily. Take with calcium   Yes Historical Provider, MD  denosumab (PROLIA) 60 MG/ML SOLN  injection Inject 60 mg into the skin every 6 (six) months. Administer in upper arm, thigh, or abdomen   Yes Historical Provider, MD  diphenhydrAMINE (BENADRYL) 25 MG tablet Take 25 mg by mouth daily as needed for sleep.   Yes Historical Provider, MD  ELIQUIS 5 MG TABS tablet Take 5 mg by mouth 2 (two) times daily. 05/12/15  Yes Historical Provider, MD  hyoscyamine (ANASPAZ) 0.125 MG TBDP disintergrating tablet Place 0.125 mg under the tongue 2 (two) times daily as needed for bladder spasms.   Yes Historical Provider, MD  levothyroxine (SYNTHROID, LEVOTHROID) 50 MCG tablet Take 1 tablet (50 mcg total) by mouth every morning. 03/25/15  Yes Ivan Anchors Love, PA-C  metoprolol tartrate (LOPRESSOR) 25 MG tablet Take 1 tablet (25 mg total) by mouth 2 (two) times daily. 03/25/15  Yes Ivan Anchors Love, PA-C  pantoprazole sodium (PROTONIX) 40 mg/20 mL PACK Take 20 mLs (40 mg total) by mouth daily. 03/30/15  Yes [name], MD  Propylene Glycol (SYSTANE BALANCE OP) Apply 1 drop to eye 2 (two) times daily.   Yes Historical Provider, MD  sertraline (ZOLOFT) 50 MG tablet Take 50 mg by mouth daily. 05/10/15  Yes Historical Provider, MD  acetaminophen (TYLENOL) 500 MG tablet Take 1 tablet (500 mg total) by mouth every 6 (six) hours as needed for mild pain. Usually only takes 2 x a  day 03/25/15   Ivan Anchors Love, PA-C  amitriptyline (ELAVIL) 10 MG tablet Take 10 mg by mouth at bedtime. 04/26/15   Historical Provider, MD  methocarbamol (ROBAXIN) 500 MG tablet Take 1 tablet (500 mg total) by mouth 3 (three) times daily. 03/25/15   Bary Leriche, PA-C  sennosides-docusate sodium (SENOKOT-S) 8.6-50 MG tablet Take 2 tablets by mouth daily as needed for constipation. 03/25/15   Bary Leriche, PA-C  tolterodine (DETROL LA) 4 MG 24 hr capsule Take 4 mg by mouth daily. 05/10/15   Historical Provider, MD   Dg Chest 1 View  05/21/2015   CLINICAL DATA:  81 year old female with fall  EXAM: CHEST  1 VIEW  COMPARISON:  Radiograph dated  03/14/2015  FINDINGS: Single-view of the chest demonstrates emphysematous changes of the lungs. There is a focal area of opacity at the left lower lung field likely combination of small pleural effusion and subsegmental atelectatic changes of the adjacent lung. Superimposed pneumonia is not excluded. There is no pneumothorax. The cardiac silhouette is within normal limits. There is diffuse osteopenia with scoliosis and degenerative changes of the thoracic spine. Stable mid thoracic vertebroplasty changes  noted. No definite acute fracture identified.  IMPRESSION: Small left pleural effusion and associated subsegmental atelectasis of the adjacent lung. No focal  Osteopenia with degenerative changes of the spine. No definite acute fracture. No pneumothorax.   Electronically Signed   By: Anner Crete M.D.   On: 05/21/2015 19:35   Ct Head Wo Contrast  05/20/2015   CLINICAL DATA:  Progressive right-sided weakness for 10 days.  EXAM: CT HEAD WITHOUT CONTRAST  TECHNIQUE: Contiguous axial images were obtained from the base of the skull through the vertex without intravenous contrast.  COMPARISON:  CT scan 03/14/2015  FINDINGS: Stable age related cerebral atrophy, ventriculomegaly and periventricular white matter disease. No extra-axial fluid collections are identified. Remote left parietal infarction is noted with encephalomalacia. There is also a remote left occipital infarct. No CT findings for acute hemispheric infarction or intracranial hemorrhage. No mass lesions. The brainstem and cerebellum are normal.  The bony structures are intact. No bone lesions or sinus disease. The globes are intact.  IMPRESSION: Stable age related cerebral atrophy, ventriculomegaly and periventricular white matter disease.  Remote infarctions.  No acute intracranial findings.   Electronically Signed   By: Marijo Sanes M.D.   On: 05/20/2015 17:46   Dg Hip Unilat With Pelvis 2-3 Views Right  05/21/2015   CLINICAL DATA:  Fall.  Right  hip pain.  Pt had just come out of shower and caregiver was assisting with getting dressed. Pt reports legs got "twisted up" and fell, denies LOC, or hitting head. No pain other than hip, some dementia. Hx of breast surgery, cardiac catheterization.  EXAM: DG HIP (WITH OR WITHOUT PELVIS) 2-3V RIGHT  COMPARISON:  None.  FINDINGS: There is a fracture of the right femoral neck. Fracture is mid cervical. No significant comminution. There is displacement, with the distal fracture component retracting superiorly, displacing by approximately 1.8 cm. There is also varus angulation.  Right femoral head is normally aligned in the acetabulum.  Bony pelvis is intact. Left hip joint is normally spaced and aligned. Bones are diffusely demineralized.  IMPRESSION: Displaced fracture of the right femoral neck with varus angulation.   Electronically Signed   By: Lajean Manes M.D.   On: 05/21/2015 19:57    Positive ROS: All other systems have been reviewed and were otherwise negative with the exception of those mentioned in the HPI and as above.  Physical Exam: General: Alert, no acute distress Cardiovascular: No pedal edema Respiratory: No cyanosis, no use of accessory musculature GI: No organomegaly, abdomen is soft and non-tender Skin: No lesions in the area of chief complaint Neurologic: Sensation intact distally Psychiatric: Patient is competent for consent with normal mood and affect Lymphatic: No axillary or cervical lymphadenopathy  MUSCULOSKELETAL:  - pain with movement of RLE - compartments soft - skin intact - NVI  Assessment: Right femoral neck fracture  Plan: - discussed with patient about r/b/a regarding surgery, they wish to proceed - will postpone surgery until Monday due to eliquis - consent obtained - NPO after midnight - foley  Thank you for the consult and the opportunity to see Ms. Loel Ro Eduard Roux, MD [place] 8:44 AM

## 2015-05-22 NOTE — Progress Notes (Signed)
PATIENT ARRIVED TO UNIT 5W FROM [facility] VIA STRETCHER WITH EMS. \TRANSFERRED TO BED. TELE APPLIED. VITALS OBTAINED. ASSESSMENT PERFORMED.  NO FAMILY PRESENT AT THIS TIME.

## 2015-05-22 NOTE — Progress Notes (Signed)
[facility] hospitalist progress note. Chief complaint. Transfer note. History of present illness. This 81 year old female presented to the emergency room with right hip pain status post falling. She was noted to have a right hip fracture and orthopedics was consult and requested patient be transferred to [facility] for surgical repair. Patient has arrived in transfer and I'm seeing her at bedside to ensure she remains clinically stable and that her orders transferred here appropriately. Patient is alert but demented and a poor historian. No evidence of acute distress. Physical exam. Vital signs. Temperature 97.6, pulse 74, respiration 18, blood pressure 160/79. O2 sats 96%. General appearance. Frail elderly female who is alert and in no distress. Cardiac. Rate and rhythm regular. Lungs. Breath sounds are clear but reduced with point pressure. Abdomen. Soft positive bowel sounds. Extremities. Pedal pulses present bilaterally. Impression/plan. Problem #1. Right hip fracture. Pain control appears adequate. For surgical repair later today. Problem #2. Atrial fib. Continue metoprolol and hold anticoagulation prior to surgery. Problem #3. Long-term anticoagulation. On eliquis we'll hold prior to surgery. Problem #4. Hypokalemia. Replace potassium and check magnesium. Problem #5. Hypothyroidism. Continue Synthroid and check TSH. Problem #6. Protein calorie malnutrition. Nutritional consult. Problem #7. Hypertension. Continue home meds. Problem #8. Prior stoke with hemiparesis and speech and language deficit. Chronic. The patient appears clinically stable and all orders appear to have transferred appropriately.

## 2015-05-22 NOTE — Progress Notes (Signed)
[facility] ON CALL NOTIFIED OF PATIENT'S ARRIVAL TO UNIT.

## 2015-05-23 ENCOUNTER — Inpatient Hospital Stay (HOSPITAL_COMMUNITY): Payer: Medicare Other | Admitting: Anesthesiology

## 2015-05-23 ENCOUNTER — Inpatient Hospital Stay (HOSPITAL_COMMUNITY): Payer: Medicare Other

## 2015-05-23 ENCOUNTER — Encounter (HOSPITAL_COMMUNITY): Payer: Self-pay | Admitting: Anesthesiology

## 2015-05-23 ENCOUNTER — Inpatient Hospital Stay: Admit: 2015-05-23 | Payer: Self-pay | Admitting: Orthopaedic Surgery

## 2015-05-23 ENCOUNTER — Encounter (HOSPITAL_COMMUNITY)
Admission: EM | Disposition: A | Discharge status: Skilled Nursing Facility | Payer: Self-pay | Source: Home / Self Care | Attending: Internal Medicine

## 2015-05-23 DIAGNOSIS — S72001A Fracture of unspecified part of neck of right femur, initial encounter for closed fracture: Principal | ICD-10-CM

## 2015-05-23 HISTORY — PX: TOTAL HIP ARTHROPLASTY: SHX124

## 2015-05-23 LAB — CBC
HCT: 35.4 % — ABNORMAL LOW (ref 36.0–46.0)
Hemoglobin: 11.5 g/dL — ABNORMAL LOW (ref 12.0–15.0)
MCH: 30.5 pg (ref 26.0–34.0)
MCHC: 32.5 g/dL (ref 30.0–36.0)
MCV: 93.9 fL (ref 78.0–100.0)
PLATELETS: 146 10*3/uL — AB (ref 150–400)
RBC: 3.77 MIL/uL — ABNORMAL LOW (ref 3.87–5.11)
RDW: 14.4 % (ref 11.5–15.5)
WBC: 9.1 10*3/uL (ref 4.0–10.5)

## 2015-05-23 LAB — APTT: APTT: 40 s — AB (ref 24–37)

## 2015-05-23 SURGERY — ARTHROPLASTY, HIP, TOTAL, ANTERIOR APPROACH
Anesthesia: General | Site: Hip | Laterality: Right

## 2015-05-23 MED ORDER — PHENOL 1.4 % MT LIQD: 1.0000 | OROMUCOSAL | Status: DC | PRN

## 2015-05-23 MED ORDER — ADULT MULTIVITAMIN W/MINERALS CH
0.5000 | ORAL_TABLET | Freq: Every day | ORAL | Status: DC
  Administered 2015-05-24 – 2015-05-25 (×2): 0.5 via ORAL
  Filled 2015-05-23 (×2): qty 1

## 2015-05-23 MED ORDER — MIRABEGRON ER 25 MG PO TB24
25.0000 mg | ORAL_TABLET | Freq: Every day | ORAL | Status: DC
  Administered 2015-05-24: 25 mg via ORAL
  Filled 2015-05-23 (×2): qty 1

## 2015-05-23 MED ORDER — SODIUM CHLORIDE 0.9 % IV SOLN
INTRAVENOUS | Status: DC
Start: 2015-05-23 — End: 2015-05-25
  Administered 2015-05-23: 125 mL/h via INTRAVENOUS
  Administered 2015-05-24: 23:00:00 via INTRAVENOUS
  Administered 2015-05-24: 125 mL/h via INTRAVENOUS
  Administered 2015-05-25: 07:00:00 via INTRAVENOUS

## 2015-05-23 MED ORDER — ONDANSETRON HCL 4 MG PO TABS: 4.0000 mg | ORAL_TABLET | Freq: Four times a day (QID) | ORAL | Status: DC | PRN

## 2015-05-23 MED ORDER — ONDANSETRON HCL 4 MG/2ML IJ SOLN
INTRAMUSCULAR | Status: AC
  Filled 2015-05-23: qty 2

## 2015-05-23 MED ORDER — ONDANSETRON HCL 4 MG/2ML IJ SOLN: 4.0000 mg | Freq: Four times a day (QID) | INTRAMUSCULAR | Status: DC | PRN

## 2015-05-23 MED ORDER — ACETAMINOPHEN 650 MG RE SUPP: 650.0000 mg | Freq: Four times a day (QID) | RECTAL | Status: DC | PRN

## 2015-05-23 MED ORDER — LIDOCAINE HCL (CARDIAC) 20 MG/ML IV SOLN
INTRAVENOUS | Status: AC
  Filled 2015-05-23: qty 5

## 2015-05-23 MED ORDER — LACTATED RINGERS IV SOLN
Freq: Once | INTRAVENOUS | Status: AC
  Administered 2015-05-23: 15:00:00 via INTRAVENOUS

## 2015-05-23 MED ORDER — SUCCINYLCHOLINE CHLORIDE 200 MG/10ML IV SOSY
PREFILLED_SYRINGE | INTRAVENOUS | Status: DC | PRN
  Administered 2015-05-23: 100 mg via INTRAVENOUS

## 2015-05-23 MED ORDER — LEVOTHYROXINE SODIUM 75 MCG PO TABS
75.0000 ug | ORAL_TABLET | Freq: Every day | ORAL | Status: DC
  Administered 2015-05-24 – 2015-05-25 (×2): 75 ug via ORAL
  Filled 2015-05-23 (×2): qty 1

## 2015-05-23 MED ORDER — CEFAZOLIN SODIUM-DEXTROSE 2-3 GM-% IV SOLR
INTRAVENOUS | Status: AC
  Filled 2015-05-23: qty 50

## 2015-05-23 MED ORDER — MENTHOL 3 MG MT LOZG: 1.0000 | LOZENGE | OROMUCOSAL | Status: DC | PRN

## 2015-05-23 MED ORDER — ACETAMINOPHEN 325 MG PO TABS
650.0000 mg | ORAL_TABLET | Freq: Four times a day (QID) | ORAL | Status: DC | PRN
  Filled 2015-05-23: qty 2

## 2015-05-23 MED ORDER — PHENYLEPHRINE HCL 10 MG/ML IJ SOLN
INTRAMUSCULAR | Status: DC | PRN
  Administered 2015-05-23 (×3): 80 ug via INTRAVENOUS

## 2015-05-23 MED ORDER — SODIUM CHLORIDE 0.9 % IV SOLN: INTRAVENOUS | Status: DC

## 2015-05-23 MED ORDER — CEFAZOLIN SODIUM-DEXTROSE 2-3 GM-% IV SOLR
2.0000 g | Freq: Four times a day (QID) | INTRAVENOUS | Status: AC
  Administered 2015-05-23 – 2015-05-24 (×3): 2 g via INTRAVENOUS
  Filled 2015-05-23 (×3): qty 50

## 2015-05-23 MED ORDER — ALUM & MAG HYDROXIDE-SIMETH 200-200-20 MG/5ML PO SUSP: 30.0000 mL | ORAL | Status: DC | PRN

## 2015-05-23 MED ORDER — ONDANSETRON HCL 4 MG/2ML IJ SOLN
INTRAMUSCULAR | Status: DC | PRN
  Administered 2015-05-23: 4 mg via INTRAVENOUS

## 2015-05-23 MED ORDER — APIXABAN 2.5 MG PO TABS
2.5000 mg | ORAL_TABLET | Freq: Two times a day (BID) | ORAL | Status: DC
  Administered 2015-05-24 – 2015-05-25 (×3): 2.5 mg via ORAL
  Filled 2015-05-23 (×3): qty 1

## 2015-05-23 MED ORDER — ACETAMINOPHEN 500 MG PO CHEW: 500.0000 mg | CHEWABLE_TABLET | Freq: Four times a day (QID) | ORAL | Status: DC | PRN

## 2015-05-23 MED ORDER — METHOCARBAMOL 500 MG PO TABS
500.0000 mg | ORAL_TABLET | Freq: Four times a day (QID) | ORAL | Status: DC | PRN
  Administered 2015-05-24 – 2015-05-25 (×2): 500 mg via ORAL
  Filled 2015-05-23 (×3): qty 1

## 2015-05-23 MED ORDER — PROPOFOL 10 MG/ML IV BOLUS
INTRAVENOUS | Status: DC | PRN
  Administered 2015-05-23: 90 mg via INTRAVENOUS

## 2015-05-23 MED ORDER — FENTANYL CITRATE (PF) 100 MCG/2ML IJ SOLN
INTRAMUSCULAR | Status: DC | PRN
  Administered 2015-05-23 (×2): 50 ug via INTRAVENOUS

## 2015-05-23 MED ORDER — FENTANYL CITRATE (PF) 250 MCG/5ML IJ SOLN
INTRAMUSCULAR | Status: AC
Start: 2015-05-23 — End: 2015-05-23
  Filled 2015-05-23: qty 5

## 2015-05-23 MED ORDER — CEFAZOLIN SODIUM-DEXTROSE 2-3 GM-% IV SOLR
2.0000 g | Freq: Four times a day (QID) | INTRAVENOUS | Status: DC
  Filled 2015-05-23 (×3): qty 50

## 2015-05-23 MED ORDER — PANTOPRAZOLE SODIUM 40 MG PO TBEC
40.0000 mg | DELAYED_RELEASE_TABLET | Freq: Every day | ORAL | Status: DC
  Administered 2015-05-24 – 2015-05-25 (×2): 40 mg via ORAL
  Filled 2015-05-23 (×2): qty 1

## 2015-05-23 MED ORDER — METOCLOPRAMIDE HCL 10 MG PO TABS: 5.0000 mg | ORAL_TABLET | Freq: Three times a day (TID) | ORAL | Status: DC | PRN

## 2015-05-23 MED ORDER — PHENYLEPHRINE 40 MCG/ML (10ML) SYRINGE FOR IV PUSH (FOR BLOOD PRESSURE SUPPORT)
PREFILLED_SYRINGE | INTRAVENOUS | Status: AC
  Filled 2015-05-23: qty 10

## 2015-05-23 MED ORDER — PROMETHAZINE HCL 25 MG/ML IJ SOLN: 6.2500 mg | INTRAMUSCULAR | Status: DC | PRN

## 2015-05-23 MED ORDER — FENTANYL CITRATE (PF) 100 MCG/2ML IJ SOLN: 25.0000 ug | INTRAMUSCULAR | Status: DC | PRN

## 2015-05-23 MED ORDER — PROPOFOL 10 MG/ML IV BOLUS
INTRAVENOUS | Status: AC
Start: 2015-05-23 — End: 2015-05-23
  Filled 2015-05-23: qty 20

## 2015-05-23 MED ORDER — CALCIUM CARBONATE-VITAMIN D 500-200 MG-UNIT PO TABS
1.0000 | ORAL_TABLET | Freq: Two times a day (BID) | ORAL | Status: DC
  Administered 2015-05-23 – 2015-05-25 (×4): 1 via ORAL
  Filled 2015-05-23 (×4): qty 1

## 2015-05-23 MED ORDER — SODIUM CHLORIDE 0.9 % IR SOLN
Status: DC | PRN
  Administered 2015-05-23: 1

## 2015-05-23 MED ORDER — METHOCARBAMOL 1000 MG/10ML IJ SOLN
500.0000 mg | Freq: Four times a day (QID) | INTRAMUSCULAR | Status: DC | PRN
  Filled 2015-05-23: qty 5

## 2015-05-23 MED ORDER — 0.9 % SODIUM CHLORIDE (POUR BTL) OPTIME
TOPICAL | Status: DC | PRN
  Administered 2015-05-23: 1000 mL

## 2015-05-23 MED ORDER — CALCIUM CARB-CHOLECALCIFEROL 500-400 MG-UNIT PO TABS: 1.0000 | ORAL_TABLET | Freq: Two times a day (BID) | ORAL | Status: DC

## 2015-05-23 MED ORDER — METOCLOPRAMIDE HCL 5 MG/ML IJ SOLN: 5.0000 mg | Freq: Three times a day (TID) | INTRAMUSCULAR | Status: DC | PRN

## 2015-05-23 MED ORDER — APIXABAN 2.5 MG PO TABS: 2.5000 mg | ORAL_TABLET | Freq: Two times a day (BID) | ORAL | Status: DC

## 2015-05-23 MED ORDER — LACTATED RINGERS IV SOLN
INTRAVENOUS | Status: DC | PRN
  Administered 2015-05-23: 16:00:00 via INTRAVENOUS

## 2015-05-23 SURGICAL SUPPLY — 57 items
BLADE SAW SGTL 18X1.27X75 (BLADE) ×2 IMPLANT
BLADE SAW SGTL 18X1.27X75MM (BLADE) ×1
BNDG COHESIVE 6X5 TAN STRL LF (GAUZE/BANDAGES/DRESSINGS) ×3 IMPLANT
CAPT HIP HEMI 2 ×2 IMPLANT
CELLS DAT CNTRL 66122 CELL SVR (MISCELLANEOUS) ×1 IMPLANT
COVER SURGICAL LIGHT HANDLE (MISCELLANEOUS) ×3 IMPLANT
DRAPE C-ARM 42X72 X-RAY (DRAPES) ×3 IMPLANT
DRAPE IMP U-DRAPE 54X76 (DRAPES) ×3 IMPLANT
DRAPE STERI IOBAN 125X83 (DRAPES) ×3 IMPLANT
DRAPE U-SHAPE 47X51 STRL (DRAPES) ×9 IMPLANT
DRSG AQUACEL AG ADV 3.5X10 (GAUZE/BANDAGES/DRESSINGS) ×3 IMPLANT
DRSG MEPILEX BORDER 4X8 (GAUZE/BANDAGES/DRESSINGS) IMPLANT
DURAPREP 26ML APPLICATOR (WOUND CARE) ×3 IMPLANT
ELECT BLADE 4.0 EZ CLEAN MEGAD (MISCELLANEOUS) ×3
ELECT REM PT RETURN 9FT ADLT (ELECTROSURGICAL) ×3
ELECTRODE BLDE 4.0 EZ CLN MEGD (MISCELLANEOUS) ×1 IMPLANT
ELECTRODE REM PT RTRN 9FT ADLT (ELECTROSURGICAL) ×1 IMPLANT
FACESHIELD WRAPAROUND (MASK) ×3 IMPLANT
FACESHIELD WRAPAROUND OR TEAM (MASK) ×1 IMPLANT
GLOVE BIOGEL PI IND STRL 6.5 (GLOVE) IMPLANT
GLOVE BIOGEL PI INDICATOR 6.5 (GLOVE) ×4
GLOVE NEODERM STRL 7.5 LF PF (GLOVE) ×2 IMPLANT
GLOVE SURG NEODERM 7.5  LF PF (GLOVE) ×6
GLOVE SURG SS PI 7.0 STRL IVOR (GLOVE) ×2 IMPLANT
GLOVE SURG SS PI 7.5 STRL IVOR (GLOVE) ×4 IMPLANT
GLOVE SURG SYN 7.5  E (GLOVE) ×6
GLOVE SURG SYN 7.5 E (GLOVE) ×3 IMPLANT
GLOVE SURG SYN 7.5 PF PI (GLOVE) ×1 IMPLANT
GOWN SRG XL XLNG 56XLVL 4 (GOWN DISPOSABLE) ×1 IMPLANT
GOWN STRL NON-REIN XL XLG LVL4 (GOWN DISPOSABLE) ×3
GOWN STRL REUS W/ TWL LRG LVL3 (GOWN DISPOSABLE) IMPLANT
GOWN STRL REUS W/TWL LRG LVL3 (GOWN DISPOSABLE) ×12
HANDPIECE INTERPULSE COAX TIP (DISPOSABLE) ×3
KIT BASIN OR (CUSTOM PROCEDURE TRAY) ×3 IMPLANT
MARKER SKIN DUAL TIP RULER LAB (MISCELLANEOUS) ×3 IMPLANT
PACK TOTAL JOINT (CUSTOM PROCEDURE TRAY) ×3 IMPLANT
PACK UNIVERSAL I (CUSTOM PROCEDURE TRAY) ×3 IMPLANT
PADDING CAST COTTON 6X4 STRL (CAST SUPPLIES) ×3 IMPLANT
RETRACTOR WND ALEXIS 18 MED (MISCELLANEOUS) ×1 IMPLANT
RTRCTR WOUND ALEXIS 18CM MED (MISCELLANEOUS) ×3
RTRCTR WOUND ALEXIS 18CM SML (INSTRUMENTS) ×3
SAVER CELL AAL HAEMONETICS (INSTRUMENTS) IMPLANT
SEALER BIPOLAR AQUA 6.0 (INSTRUMENTS) ×2 IMPLANT
SET HNDPC FAN SPRY TIP SCT (DISPOSABLE) ×1 IMPLANT
SOLUTION BETADINE 4OZ (MISCELLANEOUS) ×3 IMPLANT
SUT ETHIBOND 2 V 37 (SUTURE) ×3 IMPLANT
SUT ETHIBOND NAB CT1 #1 30IN (SUTURE) ×9 IMPLANT
SUT ETHILON 3 0 FSL (SUTURE) ×3 IMPLANT
SUT VIC AB 0 CT1 27 (SUTURE) ×3
SUT VIC AB 0 CT1 27XBRD ANBCTR (SUTURE) ×1 IMPLANT
SUT VIC AB 1 CT1 27 (SUTURE) ×3
SUT VIC AB 1 CT1 27XBRD ANBCTR (SUTURE) ×1 IMPLANT
SUT VIC AB 2-0 CT1 27 (SUTURE) ×6
SUT VIC AB 2-0 CT1 TAPERPNT 27 (SUTURE) ×2 IMPLANT
SYR 20CC LL (SYRINGE) ×3 IMPLANT
TOWEL OR 17X26 10 PK STRL BLUE (TOWEL DISPOSABLE) ×3 IMPLANT
TRAY FOLEY CATH 16FR SILVER (SET/KITS/TRAYS/PACK) ×3 IMPLANT

## 2015-05-23 NOTE — Progress Notes (Signed)
[place] for apixiban Indication: atrial fibrillation  Allergies  Allergen Reactions  . Ibuprofen Anaphylaxis    Passed out and had to be hospitalized  . Lyrica [Pregabalin] Other (See Comments)    "made me a zombie'  . Penicillins Other (See Comments)    Skin on head was crawling and very very sleepy  . Demerol [Meperidine Hcl] Nausea And Vomiting  . Dilaudid [Hydromorphone Hcl] Nausea And Vomiting  . Morphine And Related Nausea And Vomiting  . Percocet [Oxycodone-Acetaminophen] Nausea And Vomiting  . Vicodin [Hydrocodone-Acetaminophen] Nausea And Vomiting  . Fentanyl Other (See Comments)    unknown  . Lidoderm [Lidocaine] Other (See Comments)    unknown  . Other Other (See Comments)    hamburger  . Tramadol Other (See Comments)    unknown    Patient Measurements: Height: 5\' 2"  (157.5 cm) Weight: 100 lb (45.36 kg) IBW/kg (Calculated) : 50.1 Heparin Dosing Weight: 45.4 kg  Vital Signs: Temp: 98.9 F (37.2 C) (09/19 1821) Temp Source: Oral (09/19 1821) BP: 149/79 mmHg (09/19 1821) Pulse Rate: 85 (09/19 1821)  Labs:  Recent Labs  05/21/15 1900 05/22/15 0556 05/23/15 0205  HGB 12.4 12.0 11.5*  HCT 38.3 36.7 35.4*  PLT 302 261 146*  APTT  --   --  40*  LABPROT 14.1  --   --   INR 1.07  --   --   CREATININE 0.99 0.90  --     Estimated Creatinine Clearance: 33.3 mL/min (by C-G formula based on Cr of 0.9).  Assessment: 81 yo female admitted with R hip fracture now s/p OR to resume apixiban (on PTA for afib).  Spoke with Dr. Erlinda Hong and ok to restart apixiban on 05/24/15  Goal of Therapy:  Monitor platelets by anticoagulation protocol: Yes   Plan:  -Apixiban 2.5mg  po bid to begin on 05/24/15 -Will sign off for now; please contact pharmacy with any other needs  Thank you, Hildred Laser, Pharm D 05/23/2015 6:34 PM

## 2015-05-23 NOTE — Anesthesia Postprocedure Evaluation (Signed)
Anesthesia Post Note  Patient: Linda [name]  Procedure(s) Performed: Procedure(s) (LRB): HEMI-ARTHROPLASTY RIGHT HIP ANTERIOR APPROACH (Right)  Anesthesia type: General  Patient location: PACU  Post pain: Pain level controlled  Post assessment: Post-op Vital signs reviewed  Last Vitals: BP 149/79 mmHg  Pulse 85  Temp(Src) 37.2 C (Oral)  Resp 18  Ht 5\' 2"  (1.575 m)  Wt 100 lb (45.36 kg)  BMI 18.29 kg/m2  SpO2 97%  LMP 09/03/1996 (Approximate)  Post vital signs: Reviewed  Level of consciousness: sedated  Complications: No apparent anesthesia complications

## 2015-05-23 NOTE — Anesthesia Preprocedure Evaluation (Signed)
Anesthesia Evaluation  Patient identified by MRN, date of birth, ID band Patient awake    Reviewed: Allergy & Precautions, H&P , NPO status , Patient's Chart, lab work & pertinent test results  Airway Mallampati: II  TM Distance: >3 FB Neck ROM: full    Dental no notable dental hx.    Pulmonary neg pulmonary ROS,    Pulmonary exam normal breath sounds clear to auscultation       Cardiovascular Exercise Tolerance: Good hypertension, Normal cardiovascular exam Rhythm:regular Rate:Normal     Neuro/Psych negative neurological ROS  negative psych ROS   GI/Hepatic negative GI ROS, Neg liver ROS,   Endo/Other  negative endocrine ROSHypothyroidism   Renal/GU negative Renal ROS  negative genitourinary   Musculoskeletal  (+) Arthritis ,   Abdominal   Peds  Hematology negative hematology ROS (+) anemia ,   Anesthesia Other Findings   Reproductive/Obstetrics negative OB ROS                             Anesthesia Physical  Anesthesia Plan  ASA: III  Anesthesia Plan: General   Post-op Pain Management:    Induction: Intravenous  Airway Management Planned: Oral ETT  Additional Equipment:   Intra-op Plan:   Post-operative Plan: Extubation in OR  Informed Consent: I have reviewed the patients History and Physical, chart, labs and discussed the procedure including the risks, benefits and alternatives for the proposed anesthesia with the patient or authorized representative who has indicated his/her understanding and acceptance.   Dental Advisory Given  Plan Discussed with: CRNA  Anesthesia Plan Comments:         Anesthesia Quick Evaluation

## 2015-05-23 NOTE — Care Management Note (Addendum)
Case Management Note  Patient Details  Name: Linda [name] MRN: [id number] Date of Birth: [birth year]  Subjective/Objective:                  Date: 05-23-15 Monday Spoke with patient at the bedside along with Mable Fill, personal care giver, does not work for a company. Introduced self as Tourist information centre manager and explained role in discharge planning and how to be reached. Verified patient lives in [place], in a one story home, son Mayari Matus lives with patient. has DME walker, wheelchair, a cane, and per caregiver she is getting a hospital bed [facility] prior to discharge.  Expressed potential need for no other DME. Verified patient anticipates to go home with family, at time of discharge and will have full-time supervision by family and private duty care at this time to best of their knowledge. Caregiver denied needing help with medication. Patient driven by daughter in law or son or caregiver to MD appointments. Verified patient has PCP Dr Deland Pretty.   Plan: CM will continue to follow for discharge planning and [facility] resources.  Left message with Tahj Lindseth son, POA, to discuss [place] needs and DME planning.   Carles Collet RN BSN CM [phone]   Action/Plan:  05-25-15 Never received call back from patient's son, however patient will be discharging to [facility] per SW note.  Expected Discharge Date:  05/29/15               Expected Discharge Plan:  [place]  In-House Referral:     Discharge planning Services  CM Consult  Post Acute Care Choice:    Choice offered to:     DME Arranged:    DME Agency:     HH Arranged:    [place] Agency:     Status of Service:  In process, will continue to follow  Medicare Important Message Given:    Date Medicare IM Given:    Medicare IM give by:    Date Additional Medicare IM Given:    Additional Medicare Important Message give by:     If discussed at [place] of Stay Meetings, dates discussed:    Additional  Comments:  Carles Collet, RN 05/23/2015, 11:29 AM

## 2015-05-23 NOTE — Progress Notes (Signed)
[facility] for heparin Indication: atrial fibrillation  Allergies  Allergen Reactions  . Ibuprofen Anaphylaxis    Passed out and had to be hospitalized  . Lyrica [Pregabalin] Other (See Comments)    "made me a zombie'  . Penicillins Other (See Comments)    Skin on head was crawling and very very sleepy  . Demerol [Meperidine Hcl] Nausea And Vomiting  . Dilaudid [Hydromorphone Hcl] Nausea And Vomiting  . Morphine And Related Nausea And Vomiting  . Percocet [Oxycodone-Acetaminophen] Nausea And Vomiting  . Vicodin [Hydrocodone-Acetaminophen] Nausea And Vomiting  . Fentanyl Other (See Comments)    unknown  . Lidoderm [Lidocaine] Other (See Comments)    unknown  . Other Other (See Comments)    hamburger  . Tramadol Other (See Comments)    unknown    Patient Measurements: Height: 5\' 2"  (157.5 cm) Weight: 100 lb (45.36 kg) IBW/kg (Calculated) : 50.1 Heparin Dosing Weight: 45.4 kg  Vital Signs: Temp: 98 F (36.7 C) (09/19 0047) Temp Source: Oral (09/18 2015) BP: 143/81 mmHg (09/19 0047) Pulse Rate: 76 (09/19 0047)  Labs:  Recent Labs  05/20/15 1825 05/21/15 1900 05/22/15 0556 05/23/15 0205  HGB 12.5 12.4 12.0 11.5*  HCT 39.3 38.3 36.7 35.4*  PLT 276 302 261 146*  APTT  --   --   --  40*  LABPROT  --  14.1  --   --   INR  --  1.07  --   --   CREATININE 1.01* 0.99 0.90  --     Estimated Creatinine Clearance: 33.3 mL/min (by C-G formula based on Cr of 0.9).  Assessment: 81 yo female admitted with R hip fracture, h/o Afib Eliquis on hold, for heparin  Goal of Therapy:  APTT 66-102 Monitor platelets by anticoagulation protocol: Yes   Plan:  Increase Heparin 800 units/hr F/U after surgery this afternoon  Phillis Knack, PharmD, BCPS

## 2015-05-23 NOTE — H&P (Signed)

## 2015-05-23 NOTE — Progress Notes (Signed)
[facility] PROGRESS NOTE  Linda [name] DDU:[id number] DOB: 1929/01/26 DOA: 05/21/2015 PCP: Linda Pel, MD  Assessment/Plan:  1. Closed right hip fracture/Hip fracture requiring operative repair -Orthopedic Surgery was consulted -Pt to undergo surgery today    2. Paroxysmal atrial fibrillation - Continue Metoprolol Rx - Holding Eliquis - Pt has hx of CVA, thus was cont on heparin gtt, stopped this AM prior to surgery. -Presently rate controlled   3. Long-term (current) use of anticoagulants -On Eliquis Rx prior to admit, held - Was kept on heparin gtt, since held for surgery - Would resume anticoagulation when OK with surgery  4. Hypokalemia - Replaced  5. Hypothyroid -Continue Levothyroxine -TSH from 03/13/15 at 5.296. Will repeat in AM  6. Essential hypertension -Continue Metoprolol as tolerated   7. Protein-calorie malnutrition, severe -Nutrition consult  8. Hemiparesis and speech and language deficit as late effects of stroke -Chronic, stable  9. DVT Prophylaxis - full anticoagulation  Code Status: DNR Family Communication: Pt in room, family at bedside Disposition Plan: Pending  Consultants:  Orthopedic Surgery  Procedures:    Antibiotics:    HPI/Subjective: States pain is better controlled  Objective: Filed Vitals:   05/23/15 0200 05/23/15 0403 05/23/15 0426 05/23/15 1007  BP:   134/80 128/68  Pulse:  75 87 85  Temp:   98 F (36.7 C) 98.4 F (36.9 C)  TempSrc:    Oral  Resp:   18 16  Height:      Weight:      SpO2: 99% 94% 97% 95%    Intake/Output Summary (Last 24 hours) at 05/23/15 1614 Last data filed at 05/23/15 1559  Gross per 24 hour  Intake    150 ml  Output   1550 ml  Net  -1400 ml   Filed Weights   05/21/15 2159  Weight: 45.36 kg (100 lb)    Exam:   General:  Asleep, easily arousable, in nad  Cardiovascular: regular, s1,  s2  Respiratory: normal resp effort, no wheezing  Abdomen: soft,nondistended  Musculoskeletal: perfused, no clubbing, no cyanosis  Data Reviewed: Basic Metabolic Panel:  Recent Labs Lab 05/20/15 1825 05/21/15 1900 05/22/15 0556  NA 135 138 138  K 3.2* 3.2* 3.4*  CL 98* 100* 105  CO2 30 30 26   GLUCOSE 102* 122* 131*  BUN 15 17 12   CREATININE 1.01* 0.99 0.90  CALCIUM 10.6* 10.5* 10.0   Liver Function Tests:  Recent Labs Lab 05/21/15 1900  AST 67*  ALT 71*  ALKPHOS 163*  BILITOT 0.5  PROT 7.0  ALBUMIN 3.8   No results for input(s): LIPASE, AMYLASE in the last 168 hours. No results for input(s): AMMONIA in the last 168 hours. CBC:  Recent Labs Lab 05/20/15 1825 05/21/15 1900 05/22/15 0556 05/23/15 0205  WBC 7.9 9.8 12.1* 9.1  NEUTROABS 5.3 7.8*  --   --   HGB 12.5 12.4 12.0 11.5*  HCT 39.3 38.3 36.7 35.4*  MCV 93.8 93.2 93.9 93.9  PLT 276 302 261 146*   Cardiac Enzymes: No results for input(s): CKTOTAL, CKMB, CKMBINDEX, TROPONINI in the last 168 hours. BNP (last 3 results) No results for input(s): BNP in the last 8760 hours.  ProBNP (last 3 results) No results for input(s): PROBNP in the last 8760 hours.  CBG: No results for input(s): GLUCAP in the last 168 hours.  Recent Results (from the past 240 hour(s))  MRSA PCR Screening     Status: None   Collection Time: 05/22/15  3:33  AM  Result Value Ref Range Status   MRSA by PCR NEGATIVE NEGATIVE Final    Comment:        The GeneXpert MRSA Assay (FDA approved for NASAL specimens only), is one component of a comprehensive MRSA colonization surveillance program. It is not intended to diagnose MRSA infection nor to guide or monitor treatment for MRSA infections.      Studies: Dg Chest 1 View  05/21/2015   CLINICAL DATA:  81 year old female with fall  EXAM: CHEST  1 VIEW  COMPARISON:  Radiograph dated 03/14/2015  FINDINGS: Single-view of the chest demonstrates emphysematous changes of the lungs.  There is a focal area of opacity at the left lower lung field likely combination of small pleural effusion and subsegmental atelectatic changes of the adjacent lung. Superimposed pneumonia is not excluded. There is no pneumothorax. The cardiac silhouette is within normal limits. There is diffuse osteopenia with scoliosis and degenerative changes of the thoracic spine. Stable mid thoracic vertebroplasty changes noted. No definite acute fracture identified.  IMPRESSION: Small left pleural effusion and associated subsegmental atelectasis of the adjacent lung. No focal  Osteopenia with degenerative changes of the spine. No definite acute fracture. No pneumothorax.   Electronically Signed   By: Anner Crete M.D.   On: 05/21/2015 19:35   Dg Hip Unilat With Pelvis 2-3 Views Right  05/21/2015   CLINICAL DATA:  Fall.  Right hip pain.  Pt had just come out of shower and caregiver was assisting with getting dressed. Pt reports legs got "twisted up" and fell, denies LOC, or hitting head. No pain other than hip, some dementia. Hx of breast surgery, cardiac catheterization.  EXAM: DG HIP (WITH OR WITHOUT PELVIS) 2-3V RIGHT  COMPARISON:  None.  FINDINGS: There is a fracture of the right femoral neck. Fracture is mid cervical. No significant comminution. There is displacement, with the distal fracture component retracting superiorly, displacing by approximately 1.8 cm. There is also varus angulation.  Right femoral head is normally aligned in the acetabulum.  Bony pelvis is intact. Left hip joint is normally spaced and aligned. Bones are diffusely demineralized.  IMPRESSION: Displaced fracture of the right femoral neck with varus angulation.   Electronically Signed   By: Lajean Manes M.D.   On: 05/21/2015 19:57    Scheduled Meds: . [MAR Hold] amitriptyline  10 mg Oral QHS  . [MAR Hold] antiseptic oral rinse  7 mL Mouth Rinse BID  . [MAR Hold] calcium carbonate  1,250 mg Oral BID WC  . [MAR Hold] cholecalciferol   1,000 Units Oral BID WC  . [MAR Hold] fesoterodine  4 mg Oral Daily  . [MAR Hold] levothyroxine  50 mcg Oral QAC breakfast  . [MAR Hold] metoprolol tartrate  25 mg Oral BID  . [MAR Hold] pantoprazole sodium  40 mg Oral Daily  . [MAR Hold] polyvinyl alcohol  1 drop Both Eyes BID  . [MAR Hold] sertraline  50 mg Oral Daily  . [MAR Hold] sodium chloride  3 mL Intravenous Q12H   Continuous Infusions: . sodium chloride 75 mL/hr at 05/23/15 0945    Principal Problem:   Closed right hip fracture Active Problems:   Essential hypertension   Hypothyroidism   Protein-calorie malnutrition, severe   Paroxysmal atrial fibrillation   Long-term (current) use of anticoagulants   Hemiparesis and speech and language deficit as late effects of stroke   CVA (cerebral infarction)   Hip fracture requiring operative repair   Hypokalemia   Linda [name]  K  [facility] Pager [phone]. If 7PM-7AM, please contact night-coverage at [email or web address], password [facility] 05/23/2015, 4:14 PM  LOS: 2 days

## 2015-05-23 NOTE — Anesthesia Procedure Notes (Signed)
Procedure Name: Intubation Date/Time: 05/23/2015 3:46 PM Performed by: Manus Gunning, HAL J Pre-anesthesia Checklist: Timeout performed, Patient identified, Emergency Drugs available, Suction available and Patient being monitored Patient Re-evaluated:Patient Re-evaluated prior to inductionOxygen Delivery Method: Circle system utilized Preoxygenation: Pre-oxygenation with 100% oxygen Intubation Type: IV induction Ventilation: Mask ventilation without difficulty Laryngoscope Size: Mac and 3 Grade View: Grade I Tube type: Oral Tube size: 6.5 mm Number of attempts: 1 Placement Confirmation: ETT inserted through vocal cords under direct vision,  breath sounds checked- equal and bilateral and positive ETCO2 Secured at: 21 cm Tube secured with: Tape Dental Injury: Teeth and Oropharynx as per pre-operative assessment

## 2015-05-23 NOTE — Transfer of Care (Signed)
Immediate Anesthesia Transfer of Care Note  Patient: Linda [name]  Procedure(s) Performed: Procedure(s): HEMI-ARTHROPLASTY RIGHT HIP ANTERIOR APPROACH (Right)  Patient Location: PACU  Anesthesia Type:General  Level of Consciousness: awake  Airway & Oxygen Therapy: Patient Spontanous Breathing and Patient connected to face mask oxygen  Post-op Assessment: Report given to RN and Post -op Vital signs reviewed and stable  Post vital signs: Reviewed and stable  Last Vitals:  Filed Vitals:   05/23/15 1725  BP: 149/67  Pulse: 85  Temp: 36.4 C  Resp: 22    Complications: No apparent anesthesia complications

## 2015-05-23 NOTE — Progress Notes (Signed)
[facility]  Patient Status: Active (receiving services up to time of hospitalization)  [facility] is providing the following services: RN, PT and ST  If patient discharges after hours, please call [phone].   Consepcion Hearing 05/23/2015, 9:35 AM

## 2015-05-23 NOTE — Op Note (Signed)
HEMI-ARTHROPLASTY RIGHT HIP ANTERIOR APPROACH  Procedure Note Linda [name]   [id number]  Pre-op Diagnosis: RIGHT HIP FRACTURE     Post-op Diagnosis: same   Operative Procedures  1. Prosthetic replacement for femoral neck fracture. CPT [phone]  Personnel  Surgeon(s): Leandrew Koyanagi, MD   Anesthesia: general  Prosthesis: Tamala Julian and Nephew Femur: Polarstem 2 HO Head: 46 mm size: +8 Bearing Type: Bipolar  Date of Service: 05/21/2015 - 05/23/2015  Hip Hemiarthroplasty (Anterior Approach) Op Note:  After informed consent was obtained and the operative extremity marked in the holding area, the patient was brought back to the operating room and placed supine on the HANA table. Next, the operative extremity was prepped and draped in normal sterile fashion. Surgical timeout occurred verifying patient identification, surgical site, surgical procedure and administration of antibiotics.  A modified anterior Smith-Peterson approach to the hip was performed, using the interval between tensor fascia lata and sartorius.  Dissection was carried bluntly down onto the anterior hip capsule. The lateral femoral circumflex vessels were identified and coagulated. A capsulotomy was performed and the capsular flaps tagged for later repair.  Fluoroscopy was utilized to prepare for the femoral neck cut. The neck osteotomy was performed. The femoral head was removed and found a 46 mm head was the appropriate fit.    We then turned our attention to the femur.  After placing the femoral hook, the leg was taken to externally rotated, extended and adducted position taking care to perform soft tissue releases to allow for adequate mobilization of the femur. Soft tissue was cleared from the shoulder of the greater trochanter and the hook elevator used to improve exposure of the proximal femur. Sequential broaching performed up to a size 2. Trial neck and head were placed. The leg was brought back up to neutral and the  construct reduced. The position and sizing of components, offset and leg lengths were checked using fluoroscopy. Stability of the construct was checked in extension and external rotation without any subluxation or impingement of prosthesis. We dislocated the prosthesis, dropped the leg back into position, removed trial components, and irrigated copiously. The final stem and head was then placed, the leg brought back up, the system reduced and fluoroscopy used to verify positioning.  We irrigated, obtained hemostasis and closed the capsule using #2 ethibond suture.  The fascia was closed with #1 vicryl plus, the deep fat layer was closed with 0 vicryl, the subcutaneous layers closed with 2.0 Vicryl Plus and the skin closed with staples. A sterile dressing was applied. The patient was awakened in the operating room and taken to recovery in stable condition. All sponge, needle, and instrument counts were correct at the end of the case.   Position: supine  Complications: none.  Time Out: performed   Drains/Packing: none  Estimated blood loss: 150 cc  Returned to Recovery Room: in good condition.   Antibiotics: yes   Mechanical VTE (DVT) Prophylaxis: sequential compression devices, TED thigh-high  Chemical VTE (DVT) Prophylaxis: lovenox  Fluid Replacement: Crystalloid: see anesthesia record  Specimens Removed: 1 to pathology   Sponge and Instrument Count Correct? yes   PACU: portable radiograph - low AP   Admission: inpatient status, start PT & OT POD#1  Plan/RTC: Return in 2 weeks for staple removal. Return in 6 weeks to see MD.  Weight Bearing/Load Lower Extremity: full  Hip precautions: none Suture Removal: 10-14 days  Betadine to incision twice daily once dressing is removed on POD#7  N.  Eduard Roux, MD [place] [phone] 5:08 PM     Implant Name Type Inv. Item Serial No. Manufacturer Lot No. LRB No. Used  44mm Tandem Melene Muller [employer]  [id number] Right 1  polarstem- stem lateral    [employer] [id number] Right 1  femoral head       [employer] [id number] Right 1

## 2015-05-23 NOTE — Progress Notes (Signed)
Initial Nutrition Assessment  DOCUMENTATION CODES:   Underweight  INTERVENTION:   -RD will follow for diet advancement and supplement diet as appropriate   NUTRITION DIAGNOSIS:   Inadequate oral intake related to inability to eat as evidenced by NPO status.  GOAL:   Patient will meet greater than or equal to 90% of their needs  MONITOR:   PO intake, Supplement acceptance, Diet advancement, Labs, Weight trends, Skin, I & O's  REASON FOR ASSESSMENT:   Low Braden    ASSESSMENT:   Linda [name] is a 81 y.o. female with a history of CVA with Right Hemiparesis, PAF on Eliquis Rx, Osteoporosis, Hypothyroid, who was brought to the ED after falling in her home. She got up without assistance and usually uses a walker and a 1 person assist and has 24 hour care in her home. She lost her balance and fell onto her left side and had increased pain of her right hip and could not bear weight. She was found to have an acute fracture of the Right Hip and Orthopedics Dr Tomie China was consulted and requested that patient be transferred to [facility] for surgical repair in the AM.   Pt admitted with rt hip fx.   Per chart review, plans for surgical rt hip repair today. Surgery was delayed until today, due to Eliquis.   Multiple staff members in with pt at time of visit. Unable to complete Nutrition-Focused physical exam at this time.   Reviewed RD note from previous admission (12/27/14); pt with hx of severe malnutrition. Noted wt stability over the past 6 months.   Pt is currently NPO for surgery. Previously on a soft diet with 75% meal completion.  RNCM following. Likely d/c back home with home health once medically stable.   Labs reviewed: K: 3.1.   Diet Order:  Diet NPO time specified  Skin:  Reviewed, no issues  Last BM:  PTA  Height:   Ht Readings from Last 1 Encounters:  05/21/15 5\' 2"  (1.575 m)    Weight:   Wt Readings from Last 1 Encounters:  05/21/15  100 lb (45.36 kg)    Ideal Body Weight:  50 kg  BMI:  Body mass index is 18.29 kg/(m^2).  Estimated Nutritional Needs:   Kcal:  1300-1500  Protein:  55-65 grams  Fluid:  >1.3 L  EDUCATION NEEDS:   No education needs identified at this time  Jenifer A. Jimmye Norman, RD, LDN, CDE Pager: [phone] After hours Pager: [phone]

## 2015-05-23 NOTE — Discharge Instructions (Signed)
    1. Change dressings as needed 2. May shower but keep incisions covered and dry 3. Take eliquis to prevent blood clots 4. Take stool softeners as needed 5. Take pain meds as needed   

## 2015-05-24 ENCOUNTER — Encounter (HOSPITAL_COMMUNITY): Payer: Self-pay | Admitting: Orthopaedic Surgery

## 2015-05-24 LAB — BASIC METABOLIC PANEL
ANION GAP: 6 (ref 5–15)
BUN: 8 mg/dL (ref 6–20)
CHLORIDE: 98 mmol/L — AB (ref 101–111)
CO2: 30 mmol/L (ref 22–32)
Calcium: 9.3 mg/dL (ref 8.9–10.3)
Creatinine, Ser: 0.84 mg/dL (ref 0.44–1.00)
GFR calc Af Amer: >60 mL/min (ref >60)
GLUCOSE: 109 mg/dL — AB (ref 65–99)
POTASSIUM: 3.3 mmol/L — AB (ref 3.5–5.1)
Sodium: 134 mmol/L — ABNORMAL LOW (ref 135–145)

## 2015-05-24 LAB — TSH: TSH: 1.319 u[IU]/mL (ref 0.350–4.500)

## 2015-05-24 LAB — T4, FREE: Free T4: 0.93 ng/dL (ref 0.61–1.12)

## 2015-05-24 MED ORDER — POLYETHYLENE GLYCOL 3350 17 G PO PACK
17.0000 g | PACK | Freq: Every day | ORAL | Status: DC
  Administered 2015-05-24 – 2015-05-25 (×2): 17 g via ORAL
  Filled 2015-05-24 (×2): qty 1

## 2015-05-24 MED ORDER — BISACODYL 10 MG RE SUPP
10.0000 mg | Freq: Once | RECTAL | Status: AC
  Administered 2015-05-24: 10 mg via RECTAL
  Filled 2015-05-24: qty 1

## 2015-05-24 MED ORDER — TAMSULOSIN HCL 0.4 MG PO CAPS
0.4000 mg | ORAL_CAPSULE | Freq: Every day | ORAL | Status: DC
  Administered 2015-05-24 – 2015-05-25 (×2): 0.4 mg via ORAL
  Filled 2015-05-24 (×2): qty 1

## 2015-05-24 MED ORDER — POTASSIUM CHLORIDE CRYS ER 20 MEQ PO TBCR
40.0000 meq | EXTENDED_RELEASE_TABLET | Freq: Once | ORAL | Status: AC
  Administered 2015-05-24: 40 meq via ORAL
  Filled 2015-05-24: qty 2

## 2015-05-24 NOTE — Evaluation (Signed)
Physical Therapy Evaluation Patient Details Name: Linda [name] MRN: [id number] DOB: 11/03/1928 Today's Date: 05/24/2015   History of Present Illness  Linda [name] is a 81 y.o. female with a history of CVA with Right Hemiparesis, PAF on Eliquis Rx, Osteoporosis, Hypothyroid, who was brought to the ED after falling in her home. Sustained R femoral neck fx; s/p R hip direct anterior hemiarthroplasty on 05/23/15.  Clinical Impression  Pt is POD #1 and was able to tolerate OOB to chair with two person total assist.  She was lethargic likely due to pain meds, but seemed to tolerate the transfer without much pain.  She was total assist and I would recommend SNF at discharge to maximize the number of days that she receives therapy.   PT to follow acutely for deficits listed below.       Follow Up Recommendations SNF    Equipment Recommendations  Hospital bed;Other (comment) (hoyer lift)    Recommendations for Other Services   NA    Precautions / Restrictions Precautions Precautions: Fall Precaution Comments: right sided weakness, decreased cognition, and expressive (at least) difficulties Restrictions Weight Bearing Restrictions: Yes RLE Weight Bearing: Weight bearing as tolerated      Mobility  Bed Mobility Overal bed mobility: Needs Assistance;+2 for physical assistance Bed Mobility: Supine to Sit     Supine to sit: +2 for physical assistance;Total assist     General bed mobility comments: Two person total assist   Transfers Overall transfer level: Needs assistance Equipment used: Rolling walker (2 wheeled);None Transfers: Sit to/from [facility] Sit to Stand: +2 physical assistance;Total assist   Squat pivot transfers: +2 safety/equipment;Total assist     General transfer comment: Attempted to stand and pivot with RW, but unable to stand safely enough with device.  Ended up pivoting (squat) to chair with limited WB on bil legs. Bed pad and elevated  bed used to assist with transfer to the chair.    Ambulation/Gait             General Gait Details: unable to today         Balance Overall balance assessment: Needs assistance Sitting-balance support: Feet supported;Bilateral upper extremity supported Sitting balance-Leahy Scale: Poor     Standing balance support: Bilateral upper extremity supported Standing balance-Leahy Scale: Zero                               Pertinent Vitals/Pain Pain Assessment: Faces Faces Pain Scale: Hurts even more Pain Location: with mobility Pain Descriptors / Indicators: Grimacing;Guarding;Moaning Pain Intervention(s): Limited activity within patient's tolerance;Premedicated before session;Monitored during session;Repositioned    Home Living Family/patient expects to be discharged to:: Private residence Living Arrangements: Alone Available Help at Discharge: Personal care attendant;Available 24 hours/day (24/7 7 days per week. ) Type of Home: House Home Access: Stairs to enter Entrance Stairs-Rails: Chemical engineer of Steps: 7 Home Layout: One level;Laundry or work area in [place]: Marine scientist - single point;Cane - quad;Walker - 2 wheels;Transport chair Additional Comments: chart indicates family getting hospital bed as well    Prior Function Level of Independence: Needs assistance   Gait / Transfers Assistance Needed: assit with RW  ADL's / Homemaking Assistance Needed: assist with bath, but pt does try to assist, assist with dressing.         Hand Dominance   Dominant Hand: Right    Extremity/Trunk Assessment   Upper Extremity Assessment:  Defer to OT evaluation RUE Deficits / Details: hemiparesis from prior CVA; difficulty maintaining grasp on RW         Lower Extremity Assessment: RLE deficits/detail;LLE deficits/detail RLE Deficits / Details: bil leg weakness, difficult to assess strength due to guarding with AAROM  bil legs.  Pt did not put left foot flat on the ground during transfer.  Some trace movements with observation of her in bed.  Minimal WB through bil legs in standing.  LLE Deficits / Details: bil leg weakness, difficult to assess strength due to guarding with AAROM bil legs.  Pt did not put left foot flat on the ground during transfer.  Some trace movements with observation of her in bed.  Minimal WB through bil legs in standing.   Cervical / Trunk Assessment: Kyphotic  Communication   Communication: Expressive difficulties  Cognition Arousal/Alertness: Lethargic;Suspect due to medications Behavior During Therapy: Flat affect Overall Cognitive Status: History of cognitive impairments - at baseline       Memory: Decreased short-term memory;Decreased recall of precautions                 Exercises Total Joint Exercises Ankle Circles/Pumps: PROM;Both;5 reps;Supine Heel Slides: PROM;Both;5 reps;Supine Hip ABduction/ADduction: PROM;Both;5 reps;Supine      Assessment/Plan    PT Assessment Patient needs continued PT services  PT Diagnosis Difficulty walking;Abnormality of gait;Generalized weakness;Acute pain   PT Problem List Decreased strength;Decreased range of motion;Decreased activity tolerance;Decreased balance;Decreased mobility;Decreased knowledge of use of DME;Decreased coordination;Decreased cognition;Decreased safety awareness;Decreased knowledge of precautions;Pain  PT Treatment Interventions DME instruction;Gait training;Stair training;Functional mobility training;Therapeutic activities;Therapeutic exercise;Balance training;Neuromuscular re-education;Cognitive remediation;Patient/family education;Manual techniques;Modalities   PT Goals (Current goals can be found in the Care Plan section) Acute Rehab PT Goals Patient Stated Goal: per aide family wants her home PT Goal Formulation: Patient unable to participate in goal setting Time For Goal Achievement: 06/07/15 Potential  to Achieve Goals: Fair    Frequency Min 3X/week   Barriers to discharge Inaccessible home environment she will be unable to do stairs for home entry    Co-evaluation PT/OT/SLP Co-Evaluation/Treatment: Yes Reason for Co-Treatment: Complexity of the patient's impairments (multi-system involvement);Necessary to address cognition/behavior during functional activity;For patient/therapist safety           End of Session Equipment Utilized During Treatment: Gait belt Activity Tolerance: Patient limited by pain;Patient limited by fatigue;Patient limited by lethargy Patient left: in chair;with call bell/phone within reach;with family/visitor present;Other (comment) (her aide from home was present) Nurse Communication: Mobility status         Time: 6720-9470 PT Time Calculation (min) (ACUTE ONLY): 35 min   Charges:   PT Evaluation $Initial PT Evaluation Tier I: 1 Procedure          Rebecca B. Honalo, Bryant, DPT [phone]   05/24/2015, 1:29 PM

## 2015-05-24 NOTE — Progress Notes (Addendum)
[facility] PROGRESS NOTE  [name] IHK:[id number] DOB: [birth year] DOA: 05/21/2015 PCP: [name], MD  Assessment/Plan:  1. Closed right hip fracture/Hip fracture requiring operative repair -Orthopedic Surgery was initially consulted -Pt underwent prosthetic replacement for femoral neck fracture on 9/19 -Pt being evaluated by PT thus far with recs for SNF. Family agrees with this   2. Paroxysmal atrial fibrillation - Continue Metoprolol - Resumed Eliquis post-op -Remains rate controlled   3. Long-term (current) use of anticoagulants -On Eliquis Rx prior to admit, held for surgery, resumed  4. Hypokalemia - Will replace  5. Hypothyroid -Continue Levothyroxine -TSH from 03/13/15 at 5.296.  - Repeat TSH normal at 1.319, Free T4 normal  6. Essential hypertension -Continued Metoprolol as tolerated - BP well controlled   7. Protein-calorie malnutrition, severe -Nutrition consulted  8. Hemiparesis and speech and language deficit as late effects of stroke -Chronic, stable  9. DVT Prophylaxis - Resumed Eliquis per above  Code Status: DNR Family Communication: Pt in room, sitter at bedside Disposition Plan: Pending  Consultants:  Orthopedic Surgery  Procedures:    Antibiotics:    HPI/Subjective: Patient reports pain is better controlled  Objective: Filed Vitals:   05/23/15 2149 05/24/15 0535 05/24/15 1002 05/24/15 1328  BP: 159/77 133/67 122/60 130/67  Pulse: 73 73 78 78  Temp: 97.2 F (36.2 C) 99.7 F (37.6 C) 99.9 F (37.7 C) 97.7 F (36.5 C)  TempSrc: Oral Oral Oral Oral  Resp: 18 18 18    Height:      Weight:      SpO2: 99% 98% 99% 98%    Intake/Output Summary (Last 24 hours) at 05/24/15 1451 Last data filed at 05/24/15 1027  Gross per 24 hour  Intake 2522.5 ml  Output   1090 ml  Net 1432.5 ml   Filed Weights   05/21/15 2159  Weight: 45.36 kg (100 lb)     Exam:   General:  Asleep, easily arousable, in nad  Cardiovascular: regular, s1, s2  Respiratory: normal resp effort, no wheezing  Abdomen: soft,nondistended  Musculoskeletal: perfused, no clubbing, no cyanosis, bandage over post-op site is intact and dry  Data Reviewed: Basic Metabolic Panel:  Recent Labs Lab 05/20/15 1825 05/21/15 1900 05/22/15 0556 05/24/15 0648  NA 135 138 138 134*  K 3.2* 3.2* 3.4* 3.3*  CL 98* 100* 105 98*  CO2 30 30 26 30   GLUCOSE 102* 122* 131* 109*  BUN 15 17 12 8   CREATININE 1.01* 0.99 0.90 0.84  CALCIUM 10.6* 10.5* 10.0 9.3   Liver Function Tests:  Recent Labs Lab 05/21/15 1900  AST 67*  ALT 71*  ALKPHOS 163*  BILITOT 0.5  PROT 7.0  ALBUMIN 3.8   No results for input(s): LIPASE, AMYLASE in the last 168 hours. No results for input(s): AMMONIA in the last 168 hours. CBC:  Recent Labs Lab 05/20/15 1825 05/21/15 1900 05/22/15 0556 05/23/15 0205  WBC 7.9 9.8 12.1* 9.1  NEUTROABS 5.3 7.8*  --   --   HGB 12.5 12.4 12.0 11.5*  HCT 39.3 38.3 36.7 35.4*  MCV 93.8 93.2 93.9 93.9  PLT 276 302 261 146*   Cardiac Enzymes: No results for input(s): CKTOTAL, CKMB, CKMBINDEX, TROPONINI in the last 168 hours. BNP (last 3 results) No results for input(s): BNP in the last 8760 hours.  ProBNP (last 3 results) No results for input(s): PROBNP in the last 8760 hours.  CBG: No results for input(s): GLUCAP in the last 168 hours.  Recent Results (from  the past 240 hour(s))  MRSA PCR Screening     Status: None   Collection Time: 05/22/15  3:33 AM  Result Value Ref Range Status   MRSA by PCR NEGATIVE NEGATIVE Final    Comment:        The GeneXpert MRSA Assay (FDA approved for NASAL specimens only), is one component of a comprehensive MRSA colonization surveillance program. It is not intended to diagnose MRSA infection nor to guide or monitor treatment for MRSA infections.      Studies: Pelvis Portable  05/23/2015    CLINICAL DATA:  Status post right hip replacement  EXAM: PORTABLE PELVIS 1-2 VIEWS  COMPARISON:  None.  FINDINGS: Right hip replacement is now seen. Air is noted in the surgical bed. No acute bony abnormality is seen. No soft tissue changes are noted.  IMPRESSION: Status post right hip replacement   Electronically Signed   By: Inez Catalina M.D.   On: 05/23/2015 19:59   Dg Hip Operative Unilat With Pelvis Right  05/23/2015   CLINICAL DATA:  Right hip hemiarthroplasty.  EXAM: OPERATIVE RIGHT HIP (WITH PELVIS IF PERFORMED) 2 VIEWS  TECHNIQUE: Fluoroscopic spot image(s) were submitted for interpretation post-operatively.  COMPARISON:  05/21/2015  FINDINGS: Two intraoperative views of the right hip are submitted postoperatively for interpretation.  Right hip hemi-arthroplasty identified without definite complicating features.  IMPRESSION: Right hip hemiarthroplasty without complicating features.   Electronically Signed   By: Margarette Canada M.D.   On: 05/23/2015 17:12    Scheduled Meds: . amitriptyline  10 mg Oral QHS  . antiseptic oral rinse  7 mL Mouth Rinse BID  . apixaban  2.5 mg Oral BID  . calcium carbonate  1,250 mg Oral BID WC  . calcium-vitamin D  1 tablet Oral BID  . cholecalciferol  1,000 Units Oral BID WC  . fesoterodine  4 mg Oral Daily  . levothyroxine  75 mcg Oral QAC breakfast  . metoprolol tartrate  25 mg Oral BID  . mirabegron ER  25 mg Oral Daily  . multivitamin with minerals  0.5 tablet Oral Q lunch  . pantoprazole  40 mg Oral Daily  . pantoprazole sodium  40 mg Oral Daily  . polyvinyl alcohol  1 drop Both Eyes BID  . sertraline  50 mg Oral Daily  . sodium chloride  3 mL Intravenous Q12H   Continuous Infusions: . sodium chloride Stopped (05/23/15 1830)  . sodium chloride 125 mL/hr (05/24/15 0551)    Principal Problem:   Closed right hip fracture Active Problems:   Essential hypertension   Hypothyroidism   Protein-calorie malnutrition, severe   Paroxysmal atrial  fibrillation   Long-term (current) use of anticoagulants   Hemiparesis and speech and language deficit as late effects of stroke   CVA (cerebral infarction)   Hip fracture requiring operative repair   Hypokalemia   Jere Vanburen, [place] [facility] Pager [phone]. If 7PM-7AM, please contact night-coverage at [email or web address], password [facility] 05/24/2015, 2:51 PM  LOS: 3 days

## 2015-05-24 NOTE — Clinical Social Work Placement (Signed)
   CLINICAL SOCIAL WORK PLACEMENT  NOTE  Date:  05/24/2015  Patient Details  Name: Linda [name] MRN: [id number] Date of Birth: 09/19/1928  Clinical Social Work is seeking post-discharge placement for this patient at the [place] level of care (*CSW will initial, date and re-position this form in  chart as items are completed):  Yes   Patient/family provided with [facility]'s list of facilities offering this level of care within the geographic area requested by the patient (or if unable, by the patient's family).  Yes   Patient/family informed of their freedom to choose among providers that offer the needed level of care, that participate in Medicare, Medicaid or managed care program needed by the patient, have an available bed and are willing to accept the patient.  Yes   Patient/family informed of [facility]'s ownership interest in [facility] and [facility], as well as of the fact that they are under no obligation to receive care at these facilities.  PASRR submitted to EDS on 05/24/15     PASRR number received on 05/24/15     Existing PASRR number confirmed on  (n/s)     FL2 transmitted to all facilities in geographic area requested by pt/family on 05/24/15     FL2 transmitted to all facilities within larger geographic area on  (n/a)     Patient informed that his/her managed care company has contracts with or will negotiate with certain facilities, including the following:   (yes, [facility])     Yes   Patient/family informed of bed offers received.  Patient chooses bed at [facility]     Physician recommends and patient chooses bed at      Patient to be transferred to [facility] on  .  Patient to be transferred to facility by [facility]     Patient family notified on   of transfer.  Name of family member notified:        PHYSICIAN Please sign FL2     Additional Comment:     _______________________________________________ Caroline Sauger, LCSW 05/24/2015, 3:30 PM

## 2015-05-24 NOTE — Plan of Care (Signed)
Problem: Phase II Progression Outcomes Goal: Bed to chair Outcome: Progressing PT/OT orders placed

## 2015-05-24 NOTE — Care Management Important Message (Signed)
Important Message  Patient Details  Name: Linda [name] MRN: [id number] Date of Birth: 1929-09-01   Medicare Important Message Given:  Yes-second notification given    Nathen May 05/24/2015, 11:41 AM

## 2015-05-24 NOTE — Progress Notes (Signed)
Occupational Therapy Evaluation Patient Details Name: Linda [name] MRN: [id number] DOB: 04-Dec-1928 Today's Date: 05/24/2015    History of Present Illness Linda [name] is a 81 y.o. female with a history of CVA with Right Hemiparesis, PAF on Eliquis Rx, Osteoporosis, Hypothyroid, who was brought to the ED after falling in her home. Sustained R femoral neck fx; s/p R hip hemiarthroplasty on 05/23/15.   Clinical Impression   Patient presents to OT requiring total A +1-2 persons with all ADLs and mobility. She will benefit from skilled OT to maximize independence and to facilitate a safe discharge. OT will follow.    Follow Up Recommendations  SNF;Supervision/Assistance - 24 hour    Equipment Recommendations  Other (comment) (tbd at SNF)    Recommendations for Other Services       Precautions / Restrictions Precautions Precautions: Fall Restrictions Weight Bearing Restrictions: Yes RLE Weight Bearing: Weight bearing as tolerated      Mobility Bed Mobility Overal bed mobility: Needs Assistance;+2 for physical assistance;+ 2 for safety/equipment Bed Mobility: Supine to Sit     Supine to sit: Total assist;+2 for physical assistance;+2 for safety/equipment        Transfers Overall transfer level: Needs assistance Equipment used: None;Rolling walker (2 wheeled) Transfers: Sit to/from [employer] Sit to Stand: Total assist;+2 physical assistance;+2 safety/equipment;From elevated surface   Squat pivot transfers: Total assist;+2 physical assistance;+2 safety/equipment     General transfer comment: unable to keep LLE on the ground; very rigid; unable to assist.    Balance                                            ADL Overall ADL's : Needs assistance/impaired Eating/Feeding: Total assistance;Bed level   Grooming: Total assistance;Bed level   Upper Body Bathing: Total assistance;Bed level   Lower Body Bathing: Total  assistance;Bed level   Upper Body Dressing : Total assistance;Bed level   Lower Body Dressing: Total assistance;Bed level               Functional mobility during ADLs: Total assistance;+2 for physical assistance;+2 for safety/equipment General ADL Comments: Patient with eyes open; private caregiver present and gave info re PLOF and home environment. Patient with no verbalizations during session; caregiver reports lethargic from pain medication. Did moan once upon attempts to sit EOB. Patient requires total care at this time. Recommend SNF rehab prior to discharge home with private caregivers due to extensive physical assistance required with all tasks. Patient up in chair at end of session.     Vision     Perception     Praxis      Pertinent Vitals/Pain Pain Assessment: Faces Faces Pain Scale: Hurts even more Pain Location: with movement Pain Descriptors / Indicators: Other (Comment);Grimacing;Guarding;Moaning (unable to descrive) Pain Intervention(s): Limited activity within patient's tolerance;Monitored during session     Hand Dominance Right   Extremity/Trunk Assessment Upper Extremity Assessment Upper Extremity Assessment: Generalized weakness;RUE deficits/detail RUE Deficits / Details: hemiparesis from prior CVA; difficulty maintaining grasp on RW   Lower Extremity Assessment Lower Extremity Assessment: Defer to PT evaluation       Communication Communication Communication: Expressive difficulties   Cognition Arousal/Alertness: Lethargic;Suspect due to medications Behavior During Therapy: Flat affect Overall Cognitive Status: History of cognitive impairments - at baseline       Memory: Decreased recall of precautions;Decreased short-term memory  General Comments       Exercises       Shoulder Instructions      Home Living Family/patient expects to be discharged to:: Private residence Living Arrangements: Other relatives  (son) Available Help at Discharge: Personal care attendant;Available 24 hours/day Type of Home: House Home Access: Stairs to enter [employer] of Steps: 7 Entrance Stairs-Rails: Left;Right Home Layout: One level;Laundry or work area in basement     [employer] Shower/Tub: Agricultural consultant: Programmer, systems: No ((RW will not fit))   Home Equipment: Marine scientist - single point;Cane - quad;Walker - 2 wheels;Transport chair   Additional Comments: chart indicates family getting hospital bed as well      Prior Functioning/Environment Level of Independence: Needs assistance  Gait / Transfers Assistance Needed: assit with RW ADL's / Homemaking Assistance Needed: assist with bath, but pt does try to assist, assist with dressing.  Communication / Swallowing Assistance Needed: Speech difficulties since first CVA in April.  Pt does feed herself normally.  Speech is "is not good"      OT Diagnosis: Generalized weakness;Acute pain;Hemiplegia dominant side   OT Problem List: Decreased strength;Decreased range of motion;Decreased activity tolerance;Impaired balance (sitting and/or standing);Impaired vision/perception;Decreased cognition;Decreased knowledge of use of DME or AE;Decreased knowledge of precautions;Pain;Impaired UE functional use   OT Treatment/Interventions: Self-care/ADL training;Therapeutic exercise;DME and/or AE instruction;Therapeutic activities;Patient/family education    OT Goals(Current goals can be found in the care plan section) Acute Rehab OT Goals Patient Stated Goal: none stated OT Goal Formulation: Patient unable to participate in goal setting Time For Goal Achievement: 06/07/15 Potential to Achieve Goals: Fair  OT Frequency: Min 2X/week   Barriers to D/C: Decreased caregiver support;Inaccessible home environment  7 steps to enter; caregivers not used to providing level of assistance required now        Co-evaluation   Reason for Co-Treatment: Complexity of the patient's impairments (multi-system involvement);Necessary to address cognition/behavior during functional activity;For patient/therapist safety          End of Session Equipment Utilized During Treatment: Gait belt Nurse Communication: Mobility status  Activity Tolerance: Patient tolerated treatment well Patient left: in chair;with call bell/phone within reach;with family/visitor present   Time: 4650-3546 OT Time Calculation (min): 36 min Charges:  OT General Charges $OT Visit: 1 Procedure OT Evaluation $Initial OT Evaluation Tier I: 1 Procedure G-Codes:    Early, Leila A 05-29-15, 11:39 AM

## 2015-05-24 NOTE — Progress Notes (Signed)
Physical Therapy Treatment Patient Details Name: Linda [name] MRN: [id number] DOB: Jul 02, 1929 Today's Date: 05/24/2015    History of Present Illness AZELEA SEGUIN is a 81 y.o. female with a history of CVA with Right Hemiparesis, PAF on Eliquis Rx, Osteoporosis, Hypothyroid, who was brought to the ED after falling in her home. Sustained R femoral neck fx; s/p R hip direct anterior hemiarthroplasty on 05/23/15.    PT Comments    Pt was unable to stand and pivot back to the bed from the recliner due to resistance to movement.  She required maxi move total lift to get safely back to bed.  She continues to be appropriate for SNF at discharge and aide in room reports that family is on board with this plan.   Follow Up Recommendations  SNF     Equipment Recommendations  Hospital bed;Other (comment) (hoyer lift)    Recommendations for Other Services   NA     Precautions / Restrictions Precautions Precautions: Fall Precaution Comments: right sided weakness, decreased cognition, and expressive (at least) difficulties Restrictions RLE Weight Bearing: Weight bearing as tolerated    Mobility  Bed Mobility Overal bed mobility: Needs Assistance Bed Mobility: Rolling Rolling: Total assist         General bed mobility comments: total assist to roll to get off of lift pad  Transfers Overall transfer level: Needs assistance               General transfer comment: attempted to stand from recliner chair, but pt resisting posteriorly and would not keep left leg flexed under her.  Even with two person assist we were unable to get pt to even do a squat pivot back to the bed.   Ambulation/Gait             General Gait Details: unable to today          Balance Overall balance assessment: Needs assistance Sitting-balance support: Feet supported;Bilateral upper extremity supported Sitting balance-Leahy Scale: Zero Sitting balance - Comments: total assist for forward  sitting balance in recliner chair, resisting posteriorly                            Cognition Arousal/Alertness: Awake/alert Behavior During Therapy: Flat affect         Memory: Decreased recall of precautions;Decreased short-term memory                     Pertinent Vitals/Pain Pain Assessment: Faces Faces Pain Scale: Hurts whole lot Pain Location: right leg with mobility Pain Descriptors / Indicators: Guarding;Grimacing Pain Intervention(s): Limited activity within patient's tolerance;Monitored during session;Repositioned           PT Goals (current goals can now be found in the care plan section) Acute Rehab PT Goals Patient Stated Goal: to go to rehab first and then home Progress towards PT goals: Progressing toward goals    Frequency  Min 3X/week    PT Plan Current plan remains appropriate       End of Session Equipment Utilized During Treatment: Gait belt Activity Tolerance: Patient limited by pain Patient left: in bed;with call bell/phone within reach;with family/visitor present;with nursing/sitter in room     Time: 5009-3818 PT Time Calculation (min) (ACUTE ONLY): 20 min  Charges:  $Therapeutic Activity: 8-22 mins                      Rebecca B. Medendorp,  PT, DPT #[id number]   05/24/2015, 5:45 PM

## 2015-05-24 NOTE — Clinical Social Work Note (Signed)
Clinical Social Work Assessment  Patient Details  Name: Linda [name] MRN: [id number] Date of Birth: 08/20/1929  Date of referral:  05/24/15               Reason for consult:  Facility Placement, Discharge Planning                Permission sought to share information with:  Facility Sport and exercise psychologist, Family Supports Permission granted to share information::  Yes, Verbal Permission Granted  Name::     Linda [name]  Agency::  [facility]  Relationship::  Son  Contact Information:  Home: [phone], Cell: [phone]  Housing/Transportation Living arrangements for the past 2 months:  [place] of Information:  Adult Children Patient Interpreter Needed:  None Criminal Activity/Legal Involvement Pertinent to Current Situation/Hospitalization:  No - Comment as needed Significant Relationships:  Adult Children Lives with:  Adult Children Marion Seese) Do you feel safe going back to the place where you live?  No (High fall risk.) Need for family participation in patient care:  Yes (Comment) (Patient's son, Linda [name], active in patient's care.)  Care giving concerns:  Patient's son expressed no concerns at this time.   Social Worker assessment / plan:  CSW received referral for possible SNF placement at time of discharge. CSW spoke with patient's son, Linda [name], regarding discharge disposition. Per patient's son, family hopeful for SNF placement at [facility]. CSW to complete appropriate referrals and continue to follow.  Employment status:  Retired Forensic scientist:  Medicare PT Recommendations:  [place] / Referral to community resources:  [place]  Patient/Family's Response to care:  Patient's son understanding and agreeable to CSW plan of care.  Patient/Family's Understanding of and Emotional Response to Diagnosis, Current Treatment, and Prognosis:  Patient's son understanding and agreeable to CSW plan of  care.  Emotional Assessment Appearance:  Other (Comment Required (CSW spoke with patient's son as patient oriented to self only.) Attitude/Demeanor/Rapport:  Other (CSW spoke with patient's son as patient oriented to self only.) Affect (typically observed):  Other (CSW spoke with patient's son as patient oriented to self only.) Orientation:  Oriented to Self Alcohol / Substance use:  Not Applicable Psych involvement (Current and /or in the community):  No (Comment) (Not appropriate on this admission.)  Discharge Needs  Concerns to be addressed:  No discharge needs identified Readmission within the last 30 days:  No Current discharge risk:  None Barriers to Discharge:  No Barriers Identified   Caroline Sauger, LCSW 05/24/2015, 3:15 PM [phone]

## 2015-05-24 NOTE — Progress Notes (Signed)
   Subjective:  Patient reports pain as improved.    Objective:   VITALS:   Filed Vitals:   05/23/15 1821 05/23/15 2034 05/23/15 2149 05/24/15 0535  BP: 149/79 140/69 159/77 133/67  Pulse: 85 73 73 73  Temp: 98.9 F (37.2 C) 97.5 F (36.4 C) 97.2 F (36.2 C) 99.7 F (37.6 C)  TempSrc: Oral Oral Oral Oral  Resp: 18 18 18 18   Height:      Weight:      SpO2: 97% 100% 99% 98%    Neurologically intact ABD soft Neurovascular intact Sensation intact distally Intact pulses distally Dorsiflexion/Plantar flexion intact Incision: dressing C/D/I and no drainage No cellulitis present Compartment soft   Lab Results  Component Value Date   WBC 9.1 05/23/2015   HGB 11.5* 05/23/2015   HCT 35.4* 05/23/2015   MCV 93.9 05/23/2015   PLT 146* 05/23/2015     Assessment/Plan:  1 Day Post-Op   - Expected postop acute blood loss anemia - will monitor for symptoms - Up with PT/OT - DVT ppx - SCDs, ambulation, lovenox - WBAT operative extremity - Pain control - Discharge planning - likely SNF  Marianna Payment 05/24/2015, 9:16 AM [phone]

## 2015-05-25 DIAGNOSIS — E876 Hypokalemia: Secondary | ICD-10-CM

## 2015-05-25 DIAGNOSIS — D72829 Elevated white blood cell count, unspecified: Secondary | ICD-10-CM | POA: Diagnosis not present

## 2015-05-25 DIAGNOSIS — Z7901 Long term (current) use of anticoagulants: Secondary | ICD-10-CM

## 2015-05-25 DIAGNOSIS — M21251 Flexion deformity, right hip: Secondary | ICD-10-CM | POA: Diagnosis not present

## 2015-05-25 DIAGNOSIS — E871 Hypo-osmolality and hyponatremia: Secondary | ICD-10-CM | POA: Diagnosis not present

## 2015-05-25 DIAGNOSIS — S72002D Fracture of unspecified part of neck of left femur, subsequent encounter for closed fracture with routine healing: Secondary | ICD-10-CM | POA: Diagnosis not present

## 2015-05-25 DIAGNOSIS — M6281 Muscle weakness (generalized): Secondary | ICD-10-CM | POA: Diagnosis not present

## 2015-05-25 DIAGNOSIS — S72141A Displaced intertrochanteric fracture of right femur, initial encounter for closed fracture: Secondary | ICD-10-CM | POA: Diagnosis not present

## 2015-05-25 DIAGNOSIS — I480 Paroxysmal atrial fibrillation: Secondary | ICD-10-CM

## 2015-05-25 DIAGNOSIS — S72001D Fracture of unspecified part of neck of right femur, subsequent encounter for closed fracture with routine healing: Secondary | ICD-10-CM | POA: Diagnosis not present

## 2015-05-25 DIAGNOSIS — L8990 Pressure ulcer of unspecified site, unspecified stage: Secondary | ICD-10-CM | POA: Diagnosis not present

## 2015-05-25 DIAGNOSIS — F329 Major depressive disorder, single episode, unspecified: Secondary | ICD-10-CM | POA: Diagnosis not present

## 2015-05-25 DIAGNOSIS — K219 Gastro-esophageal reflux disease without esophagitis: Secondary | ICD-10-CM | POA: Diagnosis not present

## 2015-05-25 DIAGNOSIS — Z96641 Presence of right artificial hip joint: Secondary | ICD-10-CM | POA: Diagnosis not present

## 2015-05-25 DIAGNOSIS — I69359 Hemiplegia and hemiparesis following cerebral infarction affecting unspecified side: Secondary | ICD-10-CM | POA: Diagnosis not present

## 2015-05-25 DIAGNOSIS — R5381 Other malaise: Secondary | ICD-10-CM | POA: Diagnosis not present

## 2015-05-25 DIAGNOSIS — E43 Unspecified severe protein-calorie malnutrition: Secondary | ICD-10-CM | POA: Diagnosis not present

## 2015-05-25 DIAGNOSIS — I10 Essential (primary) hypertension: Secondary | ICD-10-CM | POA: Diagnosis not present

## 2015-05-25 DIAGNOSIS — I69351 Hemiplegia and hemiparesis following cerebral infarction affecting right dominant side: Secondary | ICD-10-CM | POA: Diagnosis not present

## 2015-05-25 DIAGNOSIS — R278 Other lack of coordination: Secondary | ICD-10-CM | POA: Diagnosis not present

## 2015-05-25 DIAGNOSIS — B9620 Unspecified Escherichia coli [E. coli] as the cause of diseases classified elsewhere: Secondary | ICD-10-CM | POA: Diagnosis not present

## 2015-05-25 DIAGNOSIS — Z9181 History of falling: Secondary | ICD-10-CM | POA: Diagnosis not present

## 2015-05-25 DIAGNOSIS — R2681 Unsteadiness on feet: Secondary | ICD-10-CM | POA: Diagnosis not present

## 2015-05-25 DIAGNOSIS — Z471 Aftercare following joint replacement surgery: Secondary | ICD-10-CM | POA: Diagnosis not present

## 2015-05-25 DIAGNOSIS — S72051D Unspecified fracture of head of right femur, subsequent encounter for closed fracture with routine healing: Secondary | ICD-10-CM | POA: Diagnosis not present

## 2015-05-25 DIAGNOSIS — M810 Age-related osteoporosis without current pathological fracture: Secondary | ICD-10-CM | POA: Diagnosis not present

## 2015-05-25 DIAGNOSIS — S72002A Fracture of unspecified part of neck of left femur, initial encounter for closed fracture: Secondary | ICD-10-CM | POA: Diagnosis not present

## 2015-05-25 DIAGNOSIS — R339 Retention of urine, unspecified: Secondary | ICD-10-CM

## 2015-05-25 DIAGNOSIS — I69328 Other speech and language deficits following cerebral infarction: Secondary | ICD-10-CM

## 2015-05-25 DIAGNOSIS — I63512 Cerebral infarction due to unspecified occlusion or stenosis of left middle cerebral artery: Secondary | ICD-10-CM | POA: Diagnosis not present

## 2015-05-25 DIAGNOSIS — N390 Urinary tract infection, site not specified: Secondary | ICD-10-CM | POA: Diagnosis not present

## 2015-05-25 DIAGNOSIS — D62 Acute posthemorrhagic anemia: Secondary | ICD-10-CM | POA: Diagnosis not present

## 2015-05-25 DIAGNOSIS — E039 Hypothyroidism, unspecified: Secondary | ICD-10-CM | POA: Diagnosis not present

## 2015-05-25 DIAGNOSIS — I959 Hypotension, unspecified: Secondary | ICD-10-CM | POA: Diagnosis not present

## 2015-05-25 DIAGNOSIS — R338 Other retention of urine: Secondary | ICD-10-CM | POA: Diagnosis not present

## 2015-05-25 LAB — BASIC METABOLIC PANEL
Anion gap: 6 (ref 5–15)
BUN: 13 mg/dL (ref 6–20)
CHLORIDE: 100 mmol/L — AB (ref 101–111)
CO2: 27 mmol/L (ref 22–32)
CREATININE: 0.87 mg/dL (ref 0.44–1.00)
Calcium: 9.2 mg/dL (ref 8.9–10.3)
GFR calc Af Amer: >60 mL/min (ref >60)
GFR calc non Af Amer: 59 mL/min — ABNORMAL LOW (ref >60)
GLUCOSE: 122 mg/dL — AB (ref 65–99)
POTASSIUM: 3.7 mmol/L (ref 3.5–5.1)
SODIUM: 133 mmol/L — AB (ref 135–145)

## 2015-05-25 LAB — URINALYSIS, ROUTINE W REFLEX MICROSCOPIC
Bilirubin Urine: NEGATIVE
GLUCOSE, UA: NEGATIVE mg/dL
Ketones, ur: 15 mg/dL — AB
LEUKOCYTES UA: NEGATIVE
Nitrite: NEGATIVE
PROTEIN: 30 mg/dL — AB
SPECIFIC GRAVITY, URINE: 1.025 (ref 1.005–1.030)
Urobilinogen, UA: 0.2 mg/dL (ref 0.0–1.0)
pH: 5.5 (ref 5.0–8.0)

## 2015-05-25 LAB — URINE MICROSCOPIC-ADD ON

## 2015-05-25 LAB — CBC
HCT: 27.5 % — ABNORMAL LOW (ref 36.0–46.0)
HEMOGLOBIN: 8.8 g/dL — AB (ref 12.0–15.0)
MCH: 30.1 pg (ref 26.0–34.0)
MCHC: 32 g/dL (ref 30.0–36.0)
MCV: 94.2 fL (ref 78.0–100.0)
Platelets: 198 10*3/uL (ref 150–400)
RBC: 2.92 MIL/uL — AB (ref 3.87–5.11)
RDW: 14.4 % (ref 11.5–15.5)
WBC: 15.3 10*3/uL — ABNORMAL HIGH (ref 4.0–10.5)

## 2015-05-25 MED ORDER — TAMSULOSIN HCL 0.4 MG PO CAPS: 0.4000 mg | ORAL_CAPSULE | Freq: Every day | ORAL | Status: DC

## 2015-05-25 MED ORDER — ENSURE ENLIVE PO LIQD
237.0000 mL | Freq: Two times a day (BID) | ORAL | Status: DC
  Administered 2015-05-25: 237 mL via ORAL

## 2015-05-25 MED ORDER — METHOCARBAMOL 500 MG PO TABS: 500.0000 mg | ORAL_TABLET | Freq: Four times a day (QID) | ORAL | Status: DC | PRN

## 2015-05-25 NOTE — Progress Notes (Signed)
   Subjective:  Patient reports pain as improved.    Objective:   VITALS:   Filed Vitals:   05/25/15 0158 05/25/15 0448 05/25/15 0712 05/25/15 0713  BP: 105/49 114/63 109/52   Pulse: 92 86 87 92  Temp: 98 F (36.7 C)  97.5 F (36.4 C)   TempSrc: Oral  Oral   Resp: 16  20   Height:      Weight:      SpO2: 96%  99% 100%    Neurologically intact ABD soft Neurovascular intact Sensation intact distally Intact pulses distally Dorsiflexion/Plantar flexion intact Incision: dressing C/D/I and no drainage No cellulitis present Compartment soft   Lab Results  Component Value Date   WBC 15.3* 05/25/2015   HGB 8.8* 05/25/2015   HCT 27.5* 05/25/2015   MCV 94.2 05/25/2015   PLT 198 05/25/2015     Assessment/Plan:  2 Days Post-Op   - Expected postop acute blood loss anemia - will monitor for symptoms - Up with PT/OT - DVT ppx - SCDs, ambulation, lovenox - WBAT operative extremity - Pain control - Discharge planning - SNF - foley can come out  Marianna Payment 05/25/2015, 7:56 AM [phone]

## 2015-05-25 NOTE — Discharge Planning (Signed)
Patient to be discharged to [facility]. Patient's son left message.  Facility: [facility] RN report number: [phone] Transportation: EMS  [facility]ubertha Sayres, [place] [phone]) and Surgical [phone])

## 2015-05-25 NOTE — Progress Notes (Signed)
Floor coverage Baltazar Najjar NP paged regarding patient's need for indwelling catheter with bladder scan of 286 mL.

## 2015-05-25 NOTE — Progress Notes (Signed)
NURSING PROGRESS NOTE  Linda [name] [id number] Discharge Data: 05/25/2015 2:31 PM Attending Jasson Siegmann: Louellen Molder, MD JYN:[id number],OZHYQM Monia Pouch, MD     [name] to be D/C'd Skilled nursing facility per MD order.  Discussed with the patient the After Visit Summary and all questions fully answered. All IV's discontinued with no bleeding noted. All belongings returned to patient for patient to take to [facility]. Pt transferred via stretcher by PTAR.  Last Vital Signs:  Blood pressure 112/72, pulse 84, temperature 97.5 F (36.4 C), temperature source Oral, resp. rate 20, height 5\' 2"  (1.575 m), weight 45.36 kg (100 lb), last menstrual period 09/03/1996, SpO2 100 %.  Discharge Medication List   Medication List    STOP taking these medications        acetaminophen 500 MG tablet  Commonly known as:  TYLENOL  Replaced by:  acetaminophen 500 MG chewable tablet     sennosides-docusate sodium 8.6-50 MG tablet  Commonly known as:  SENOKOT-S      TAKE these medications        acetaminophen 500 MG chewable tablet  Commonly known as:  TYLENOL  Chew 1 tablet (500 mg total) by mouth every 6 (six) hours as needed for pain.     amitriptyline 10 MG tablet  Commonly known as:  ELAVIL  Take 10 mg by mouth at bedtime.     apixaban 2.5 MG Tabs tablet  Commonly known as:  ELIQUIS  Take 1 tablet (2.5 mg total) by mouth 2 (two) times daily.     CALCIUM 500 +D 500-400 MG-UNIT Tabs  Generic drug:  Calcium Carb-Cholecalciferol  Take 1 tablet by mouth 2 (two) times daily.     cholecalciferol 1000 UNITS tablet  Commonly known as:  VITAMIN D  Take 1,000 Units by mouth 2 (two) times daily.     denosumab 60 MG/ML Soln injection  Commonly known as:  PROLIA  Inject 60 mg into the skin every 6 (six) months. Administer in upper arm, thigh, or abdomen     levothyroxine 75 MCG tablet  Commonly known as:  SYNTHROID, LEVOTHROID  Take 75 mcg by mouth daily before breakfast.     methocarbamol 500 MG tablet  Commonly known as:  ROBAXIN  Take 1 tablet (500 mg total) by mouth every 6 (six) hours as needed for muscle spasms.     metoprolol tartrate 25 MG tablet  Commonly known as:  LOPRESSOR  Take 1 tablet (25 mg total) by mouth 2 (two) times daily.     multivitamin with minerals Tabs tablet  Take 0.5 tablets by mouth daily with lunch.     MYRBETRIQ 25 MG Tb24 tablet  Generic drug:  mirabegron ER  Take 25 mg by mouth daily.     pantoprazole 40 MG tablet  Commonly known as:  PROTONIX  Take 40 mg by mouth daily.     sertraline 50 MG tablet  Commonly known as:  ZOLOFT  Take 50 mg by mouth daily.     SYSTANE BALANCE OP  Place 1 drop into both eyes daily as needed (dry eyes).     tamsulosin 0.4 MG Caps capsule  Commonly known as:  FLOMAX  Take 1 capsule (0.4 mg total) by mouth daily.         Charolette Child, RN

## 2015-05-25 NOTE — Progress Notes (Signed)
Pt unable to void this am and having pain at the suprapubic region. Bladder scan result was 500 mL. Notified Dr Laverle Patter, who ordered to insert foley catheter. Foley Catheter placed using sterile technique with Rache L'esperancel, RN assisting. Pt tolerated well.

## 2015-05-25 NOTE — Clinical Social Work Placement (Signed)
   CLINICAL SOCIAL WORK PLACEMENT  NOTE  Date:  05/25/2015  Patient Details  Name: Linda [name] MRN: [id number] Date of Birth: Jan 20, 1929  Clinical Social Work is seeking post-discharge placement for this patient at the [place] level of care (*CSW will initial, date and re-position this form in  chart as items are completed):  Yes   Patient/family provided with [place] Work Department's list of facilities offering this level of care within the geographic area requested by the patient (or if unable, by the patient's family).  Yes   Patient/family informed of their freedom to choose among providers that offer the needed level of care, that participate in Medicare, Medicaid or managed care program needed by the patient, have an available bed and are willing to accept the patient.  Yes   Patient/family informed of [facility]'s ownership interest in [facility] and [facility], as well as of the fact that they are under no obligation to receive care at these facilities.  PASRR submitted to EDS on 05/24/15     PASRR number received on 05/24/15     Existing PASRR number confirmed on  (n/s)     FL2 transmitted to all facilities in geographic area requested by pt/family on 05/24/15     FL2 transmitted to all facilities within larger geographic area on  (n/a)     Patient informed that his/her managed care company has contracts with or will negotiate with certain facilities, including the following:   (yes, [facility])     Yes   Patient/family informed of bed offers received.  Patient chooses bed at [facility]     Physician recommends and patient chooses bed at      Patient to be transferred to [facility] on 05/25/15.  Patient to be transferred to facility by PTAR     Patient family notified on 05/25/15 of transfer.  Name of family member notified:  Son, Jenny Reichmann.     PHYSICIAN Please sign FL2     Additional Comment:     _______________________________________________ Caroline Sauger, LCSW 05/25/2015, 3:03 PM

## 2015-05-25 NOTE — Progress Notes (Signed)
Nutrition Follow-up  DOCUMENTATION CODES:   Underweight, Severe malnutrition in context of chronic illness  INTERVENTION:  Ensure Enlive po BID, each supplement provides 350 kcal and 20 grams of protein  NUTRITION DIAGNOSIS:   Malnutrition related to chronic illness as evidenced by severe depletion of body fat, severe depletion of muscle mass.  Ongoing  GOAL:   Patient will meet greater than or equal to 90% of their needs  Progressing  MONITOR:   PO intake, Supplement acceptance, Labs, Weight trends, Skin, I & O's  REASON FOR ASSESSMENT:   Consult Poor PO  ASSESSMENT:   Linda [name] is a 81 y.o. female with a history of CVA with Right Hemiparesis, PAF on Eliquis Rx, Osteoporosis, Hypothyroid, who was brought to the ED after falling in her home. She got up without assistance and usually uses a walker and a 1 person assist and has 24 hour care in her home. She lost her balance and fell onto her left side and had increased pain of her right hip and could not bear weight. She was found to have an acute fracture of the Right Hip and Orthopedics Dr Tomie China was consulted and requested that patient be transferred to [facility] for surgical repair in the AM.   Pt admitted with closed rt hip fx.  S/p procedure on 05/23/15: Prosthetic replacement for femoral neck fracture.  Paged by Dr. Clementeen Graham this AM; requesting supplement recommendations for discharge to SNF [facility]) later this afternoon.   Hx obtained by pt caregiver at bedside. She reports pt with a good appetite at home. She typically consumes breakfast and lunch very well, but does not eat well at dinner. She reveals that pt requires feeding assistance and it takes a lot of time and attention to feed pt. She reveals pt has gained about 9# since August. She was also consuming 2 Premier Protein Shakes PTA. Pt has been taking strawberry Ensure well during this hospitalization and is amenable to order.    Discussed importance of good PO and supplement intake to support healing, recovery, and optimizing nutritional status. Discussed that supplement formulary may differ between facilities, however, equivalent will be provided when pt is transitioned over to SNF.   Nutrition-Focused physical exam completed. Findings are severe fat depletion, severe muscle depletion, and mild edema. Noted mild to moderate edema on rt hand; caregiver reports that this is typical for pt due to functional deficits from past CVA.   Labs reviewed: Na: 133 (on IV supplementation).   Diet Order:  Diet regular Room service appropriate?: Yes; Fluid consistency:: Thin  Skin:  Reviewed, no issues (closed rt hip incision)  Last BM:  05/25/15  Height:   Ht Readings from Last 1 Encounters:  05/21/15 5\' 2"  (1.575 m)    Weight:   Wt Readings from Last 1 Encounters:  05/21/15 100 lb (45.36 kg)    Ideal Body Weight:  50 kg  BMI:  Body mass index is 18.29 kg/(m^2).  Estimated Nutritional Needs:   Kcal:  1300-1500  Protein:  55-65 grams  Fluid:  >1.3 L  EDUCATION NEEDS:   Education needs addressed  Jenifer A. Jimmye Norman, RD, LDN, CDE Pager: [phone] After hours Pager: [phone]

## 2015-05-25 NOTE — Discharge Summary (Signed)
Physician Discharge Summary  Linda [name] QQP:[id number] DOB: 1929-07-26 DOA: 05/21/2015  PCP: [name], MD  Admit date: 05/21/2015 Discharge date: 05/25/2015  Time spent: 35 minutes  Recommendations for Outpatient Follow-up:  #1 Discharged to skilled nursing facility for skilled nursing and PT. #2 Please attempt a voiding trial in the next 3-4 days and remove Foley catheter. As per patient's son, patient has tendency to hold her urine to avoid going to the bathroom and may benefit from bedside commode. #3 patient should follow-up with orthopedics Dr Erlinda Hong in 2 weeks. #4 please follow nutritionist recommendation from 9/21 for supplements. She will benefit from dietitian follow-up at the facility.   Discharge Diagnoses:  Principal Problem:   Closed right hip fracture   Active Problems:   Essential hypertension   Acute urinary retention   Hypothyroidism   Protein-calorie malnutrition, severe   Acute blood loss anemia, postop   Paroxysmal atrial fibrillation   Long-term (current) use of anticoagulants   Hemiparesis and speech and language deficit as late effects of stroke   CVA (cerebral infarction)   Hypokalemia    Discharge Condition: Fair  Diet recommendation: Regular  CODE STATUS: Full code  [facility] Weights   05/21/15 2159  Weight: 45.36 kg (100 lb)    History of present illness:  Please refer to admission H&P for details, in brief, 81 year old female with history of CVA with residual right hemiparesis and aphasia, personal A. fib Eliquis, osteoporosis, hypothyroidism, essential hypertension who was brought from home after sustaining a mechanical fall. At baseline she uses a walker with 1 person assist and has a 24-hour caregiver at home. On the day of admission she got up without assistance, lost her balance and fell following which she was unable to bear weight. X-ray done in the ED showed acute fracture of the right hip. Orthopedics was consulted and  patient was transferred [facility] repair. Patient underwent right hip hemiarthroplasty on 9/19. Patient seen by physical therapy postop and recommended skilled nursing facility.  Hospital Course:   Right hip fracture Status post right hemiarthroplasty. Patient received when necessary fentanyl and Dilaudid while in the hospital. She has documented allergy to multiple narcotics. Will discharge her on Tylenol and Robaxin for muscle spasm. -Physical therapy at skilled nursing facility. Continue bowel regimen. -Has mild postop acute blood was anemia. Follow-up hemoglobin as outpatient. Weightbearing as therapy per orthopedics. Follow-up in 2 weeks for staples removal.   Paroxysmal A. fib Rate controlled. Resume eliquis and metoprolol.  Acute urinary retention Postop. Likely associated with pain. Patient does have mild leukocytosis and will check UA prior to discharge. As per her son patient is hesitant on going to the bathroom at home and may benefit from bedside commode. Patient had 500 mL urinary retention on bladder scan checked both yesterday and this morning . Foley catheter placed in and started her on flomax. Please attempt voiding trial at the facility once she is out of bed and starts ambulate with physical therapy.   Hypokalemia Replenished   History of CVA with aphasia and right hemiparesis Continue eliquis and BB.  Severe protein calorie malnutrition Seen by nutrition consult and recommendations awaited. Please follow-up as outpatient.  Hypothyroidism Patient had elevated TSH from 03/13/2015 (5.29). Repeat TSH normal at 1.31. Continue current dose of Synthroid.  DVT prophylaxis: Patient is on chronic eliquis  Procedures:  Right hip anterior hemiarthroplasty  Consultations:  Dr. Erlinda Hong (orthopedics)   Family communication: Discussed with son Toria Monte in detail  on the phone.  Disposition: Skilled nursing facility   Discharge Exam: Filed  Vitals:   05/25/15 0713  BP:   Pulse: 92  Temp:   Resp:     General: Elderly female lying in bed appears fatigued, aphasic HEENT: Temporal wasting, no pallor, moist oral mucosa, supple neck Cardiovascular: S1 and S2 irregularly irregular, no murmurs rub or gallop Respiratory: Clear bilaterally, no added sounds GI: Soft, nondistended, mild suprapubic tenderness, bowel sounds present Extremities: Dressing over right hip intact, no edema CNS: Alert and awake, aphasic, right hemiparesis (chronic)  Discharge Instructions   Discharge Instructions    Weight bearing as tolerated    Complete by:  As directed           Current Discharge Medication List    START taking these medications   Details  acetaminophen (TYLENOL) 500 MG chewable tablet Chew 1 tablet (500 mg total) by mouth every 6 (six) hours as needed for pain. Qty: 30 tablet, Refills: 0    tamsulosin (FLOMAX) 0.4 MG CAPS capsule Take 1 capsule (0.4 mg total) by mouth daily. Qty: 30 capsule, Refills: 0      CONTINUE these medications which have CHANGED   Details  apixaban (ELIQUIS) 2.5 MG TABS tablet Take 1 tablet (2.5 mg total) by mouth 2 (two) times daily. Qty: 14 tablet, Refills: 0    methocarbamol (ROBAXIN) 500 MG tablet Take 1 tablet (500 mg total) by mouth every 6 (six) hours as needed for muscle spasms. Qty: 30 tablet, Refills: 0      CONTINUE these medications which have NOT CHANGED   Details  amitriptyline (ELAVIL) 10 MG tablet Take 10 mg by mouth at bedtime.  Refills: 6    Calcium Carb-Cholecalciferol (CALCIUM 500 +D) 500-400 MG-UNIT TABS Take 1 tablet by mouth 2 (two) times daily.    cholecalciferol (VITAMIN D) 1000 UNITS tablet Take 1,000 Units by mouth 2 (two) times daily.     denosumab (PROLIA) 60 MG/ML SOLN injection Inject 60 mg into the skin every 6 (six) months. Administer in upper arm, thigh, or abdomen    levothyroxine (SYNTHROID, LEVOTHROID) 75 MCG tablet Take 75 mcg by mouth daily before  breakfast.    metoprolol tartrate (LOPRESSOR) 25 MG tablet Take 1 tablet (25 mg total) by mouth 2 (two) times daily. Qty: 30 tablet, Refills: 1    mirabegron ER (MYRBETRIQ) 25 MG TB24 tablet Take 25 mg by mouth daily.    Multiple Vitamin (MULTIVITAMIN WITH MINERALS) TABS tablet Take 0.5 tablets by mouth daily with lunch.    pantoprazole (PROTONIX) 40 MG tablet Take 40 mg by mouth daily.    Propylene Glycol (SYSTANE BALANCE OP) Place 1 drop into both eyes daily as needed (dry eyes).     sertraline (ZOLOFT) 50 MG tablet Take 50 mg by mouth daily. Refills: 3      STOP taking these medications     acetaminophen (TYLENOL) 500 MG tablet      pantoprazole sodium (PROTONIX) 40 mg/20 mL PACK      sennosides-docusate sodium (SENOKOT-S) 8.6-50 MG tablet      calcium gluconate 500 MG tablet      hyoscyamine (ANASPAZ) 0.125 MG TBDP disintergrating tablet      tolterodine (DETROL LA) 4 MG 24 hr capsule        Allergies  Allergen Reactions  . Ibuprofen Anaphylaxis    Passed out and had to be hospitalized  . Lyrica [Pregabalin] Other (See Comments)    "made me a zombie'  .  Penicillins Other (See Comments)    Skin on head was crawling and very very sleepy  . Demerol [Meperidine Hcl] Nausea And Vomiting  . Dilaudid [Hydromorphone Hcl] Nausea And Vomiting  . Morphine And Related Nausea And Vomiting  . Percocet [Oxycodone-Acetaminophen] Nausea And Vomiting  . Vicodin [Hydrocodone-Acetaminophen] Nausea And Vomiting  . Fentanyl Other (See Comments)    unknown  . Lidoderm [Lidocaine] Other (See Comments)    unknown  . Other Other (See Comments)    hamburger  . Tramadol Other (See Comments)    unknown   Follow-up Information    Follow up with Marianna Payment, MD In 2 weeks.   Specialty:  Orthopedic Surgery   Why:  For suture removal, For wound re-check   Contact information:   [address] [place] [id number] [phone]       Please follow up.   Why:  MD at  SNF in 1 week.        The results of significant diagnostics from this hospitalization (including imaging, microbiology, ancillary and laboratory) are listed below for reference.    Significant Diagnostic Studies: Dg Chest 1 View  05/21/2015   CLINICAL DATA:  81 year old female with fall  EXAM: CHEST  1 VIEW  COMPARISON:  Radiograph dated 03/14/2015  FINDINGS: Single-view of the chest demonstrates emphysematous changes of the lungs. There is a focal area of opacity at the left lower lung field likely combination of small pleural effusion and subsegmental atelectatic changes of the adjacent lung. Superimposed pneumonia is not excluded. There is no pneumothorax. The cardiac silhouette is within normal limits. There is diffuse osteopenia with scoliosis and degenerative changes of the thoracic spine. Stable mid thoracic vertebroplasty changes noted. No definite acute fracture identified.  IMPRESSION: Small left pleural effusion and associated subsegmental atelectasis of the adjacent lung. No focal  Osteopenia with degenerative changes of the spine. No definite acute fracture. No pneumothorax.   Electronically Signed   By: Anner Crete M.D.   On: 05/21/2015 19:35   Ct Head Wo Contrast  05/20/2015   CLINICAL DATA:  Progressive right-sided weakness for 10 days.  EXAM: CT HEAD WITHOUT CONTRAST  TECHNIQUE: Contiguous axial images were obtained from the base of the skull through the vertex without intravenous contrast.  COMPARISON:  CT scan 03/14/2015  FINDINGS: Stable age related cerebral atrophy, ventriculomegaly and periventricular white matter disease. No extra-axial fluid collections are identified. Remote left parietal infarction is noted with encephalomalacia. There is also a remote left occipital infarct. No CT findings for acute hemispheric infarction or intracranial hemorrhage. No mass lesions. The brainstem and cerebellum are normal.  The bony structures are intact. No bone lesions or sinus disease.  The globes are intact.  IMPRESSION: Stable age related cerebral atrophy, ventriculomegaly and periventricular white matter disease.  Remote infarctions.  No acute intracranial findings.   Electronically Signed   By: Marijo Sanes M.D.   On: 05/20/2015 17:46   Pelvis Portable  05/23/2015   CLINICAL DATA:  Status post right hip replacement  EXAM: PORTABLE PELVIS 1-2 VIEWS  COMPARISON:  None.  FINDINGS: Right hip replacement is now seen. Air is noted in the surgical bed. No acute bony abnormality is seen. No soft tissue changes are noted.  IMPRESSION: Status post right hip replacement   Electronically Signed   By: Inez Catalina M.D.   On: 05/23/2015 19:59   Dg Hip Operative Unilat With Pelvis Right  05/23/2015   CLINICAL DATA:  Right hip hemiarthroplasty.  EXAM: OPERATIVE RIGHT HIP (WITH PELVIS IF PERFORMED) 2 VIEWS  TECHNIQUE: Fluoroscopic spot image(s) were submitted for interpretation post-operatively.  COMPARISON:  05/21/2015  FINDINGS: Two intraoperative views of the right hip are submitted postoperatively for interpretation.  Right hip hemi-arthroplasty identified without definite complicating features.  IMPRESSION: Right hip hemiarthroplasty without complicating features.   Electronically Signed   By: Margarette Canada M.D.   On: 05/23/2015 17:12   Dg Hip Unilat With Pelvis 2-3 Views Right  05/21/2015   CLINICAL DATA:  Fall.  Right hip pain.  Pt had just come out of shower and caregiver was assisting with getting dressed. Pt reports legs got "twisted up" and fell, denies LOC, or hitting head. No pain other than hip, some dementia. Hx of breast surgery, cardiac catheterization.  EXAM: DG HIP (WITH OR WITHOUT PELVIS) 2-3V RIGHT  COMPARISON:  None.  FINDINGS: There is a fracture of the right femoral neck. Fracture is mid cervical. No significant comminution. There is displacement, with the distal fracture component retracting superiorly, displacing by approximately 1.8 cm. There is also varus angulation.  Right  femoral head is normally aligned in the acetabulum.  Bony pelvis is intact. Left hip joint is normally spaced and aligned. Bones are diffusely demineralized.  IMPRESSION: Displaced fracture of the right femoral neck with varus angulation.   Electronically Signed   By: Lajean Manes M.D.   On: 05/21/2015 19:57    Microbiology: Recent Results (from the past 240 hour(s))  MRSA PCR Screening     Status: None   Collection Time: 05/22/15  3:33 AM  Result Value Ref Range Status   MRSA by PCR NEGATIVE NEGATIVE Final    Comment:        The GeneXpert MRSA Assay (FDA approved for NASAL specimens only), is one component of a comprehensive MRSA colonization surveillance program. It is not intended to diagnose MRSA infection nor to guide or monitor treatment for MRSA infections.      Labs: Basic Metabolic Panel:  Recent Labs Lab 05/20/15 1825 05/21/15 1900 05/22/15 0556 05/24/15 0648 05/25/15 0532  NA 135 138 138 134* 133*  K 3.2* 3.2* 3.4* 3.3* 3.7  CL 98* 100* 105 98* 100*  CO2 30 30 26 30 27   GLUCOSE 102* 122* 131* 109* 122*  BUN 15 17 12 8 13   CREATININE 1.01* 0.99 0.90 0.84 0.87  CALCIUM 10.6* 10.5* 10.0 9.3 9.2   Liver Function Tests:  Recent Labs Lab 05/21/15 1900  AST 67*  ALT 71*  ALKPHOS 163*  BILITOT 0.5  PROT 7.0  ALBUMIN 3.8   No results for input(s): LIPASE, AMYLASE in the last 168 hours. No results for input(s): AMMONIA in the last 168 hours. CBC:  Recent Labs Lab 05/20/15 1825 05/21/15 1900 05/22/15 0556 05/23/15 0205 05/25/15 0532  WBC 7.9 9.8 12.1* 9.1 15.3*  NEUTROABS 5.3 7.8*  --   --   --   HGB 12.5 12.4 12.0 11.5* 8.8*  HCT 39.3 38.3 36.7 35.4* 27.5*  MCV 93.8 93.2 93.9 93.9 94.2  PLT 276 302 261 146* 198   Cardiac Enzymes: No results for input(s): CKTOTAL, CKMB, CKMBINDEX, TROPONINI in the last 168 hours. BNP: BNP (last 3 results) No results for input(s): BNP in the last 8760 hours.  ProBNP (last 3 results) No results for  input(s): PROBNP in the last 8760 hours.  CBG: No results for input(s): GLUCAP in the last 168 hours.     SignedLouellen Molder  [facility] 05/25/2015, 11:16  AM    

## 2015-05-25 NOTE — Progress Notes (Signed)
Report given to Nell Range, nurse at [facility]. All questions answered.

## 2015-05-26 ENCOUNTER — Non-Acute Institutional Stay (SKILLED_NURSING_FACILITY): Payer: Medicare Other | Admitting: Internal Medicine

## 2015-05-26 ENCOUNTER — Encounter: Payer: Self-pay | Admitting: Internal Medicine

## 2015-05-26 DIAGNOSIS — D62 Acute posthemorrhagic anemia: Secondary | ICD-10-CM | POA: Diagnosis not present

## 2015-05-26 DIAGNOSIS — S72141A Displaced intertrochanteric fracture of right femur, initial encounter for closed fracture: Secondary | ICD-10-CM

## 2015-05-26 DIAGNOSIS — D72829 Elevated white blood cell count, unspecified: Secondary | ICD-10-CM

## 2015-05-26 DIAGNOSIS — F329 Major depressive disorder, single episode, unspecified: Secondary | ICD-10-CM

## 2015-05-26 DIAGNOSIS — K219 Gastro-esophageal reflux disease without esophagitis: Secondary | ICD-10-CM

## 2015-05-26 DIAGNOSIS — R338 Other retention of urine: Secondary | ICD-10-CM | POA: Diagnosis not present

## 2015-05-26 DIAGNOSIS — E039 Hypothyroidism, unspecified: Secondary | ICD-10-CM | POA: Diagnosis not present

## 2015-05-26 DIAGNOSIS — F32A Depression, unspecified: Secondary | ICD-10-CM | POA: Insufficient documentation

## 2015-05-26 DIAGNOSIS — L8990 Pressure ulcer of unspecified site, unspecified stage: Secondary | ICD-10-CM

## 2015-05-26 DIAGNOSIS — I480 Paroxysmal atrial fibrillation: Secondary | ICD-10-CM | POA: Diagnosis not present

## 2015-05-26 DIAGNOSIS — E871 Hypo-osmolality and hyponatremia: Secondary | ICD-10-CM

## 2015-05-26 DIAGNOSIS — I63512 Cerebral infarction due to unspecified occlusion or stenosis of left middle cerebral artery: Secondary | ICD-10-CM

## 2015-05-26 DIAGNOSIS — E43 Unspecified severe protein-calorie malnutrition: Secondary | ICD-10-CM

## 2015-05-26 DIAGNOSIS — M810 Age-related osteoporosis without current pathological fracture: Secondary | ICD-10-CM

## 2015-05-26 DIAGNOSIS — R5381 Other malaise: Secondary | ICD-10-CM

## 2015-05-26 NOTE — Progress Notes (Signed)
Patient ID: Linda [name], female   DOB: 01-13-1929, 81 y.o.   MRN: [id number]     Facility: [facility]    PCP: [name], MD  Code Status:FULL CODE  Allergies  Allergen Reactions  . Ibuprofen Anaphylaxis    Passed out and had to be hospitalized  . Lyrica [Pregabalin] Other (See Comments)    "made me a zombie'  . Penicillins Other (See Comments)    Skin on head was crawling and very very sleepy  . Demerol [Meperidine Hcl] Nausea And Vomiting  . Dilaudid [Hydromorphone Hcl] Nausea And Vomiting  . Morphine And Related Nausea And Vomiting  . Percocet [Oxycodone-Acetaminophen] Nausea And Vomiting  . Vicodin [Hydrocodone-Acetaminophen] Nausea And Vomiting  . Fentanyl Other (See Comments)    unknown  . Lidoderm [Lidocaine] Other (See Comments)    unknown  . Other Other (See Comments)    hamburger  . Tramadol Other (See Comments)    unknown    Chief Complaint  Patient presents with  . New Admit To SNF    New Admission      HPI:  81 y.o. patient is here for short term rehabilitation post hospital admission from 05/21/15-05/25/15 post fall with closed right hip fracture. She underwent right hip hemiarthroplasty. She developed acute urinary retention and had a foley catheter placed. She has PMH of CVA with right hemiparesis, aphasia, afib, HTN, osteoporosis among others. She is seen in her room today with her caregiver present. She is aphasic mostly but can repeat few words after me. She answers to most of the question by moving her head.   Review of Systems:  Constitutional: positive for fatigue. Negative for fever, chills, diaphoresis.  HENT: Negative for headache, congestion, nasal discharge   Eyes: Negative for eye pain, blurred vision, double vision and discharge.  Respiratory: Negative for cough, shortness of breath and wheezing.   Cardiovascular: Negative for chest pain, palpitations, leg swelling.  Gastrointestinal: Negative for  heartburn, nausea, vomiting, abdominal pain. Bowel movement this am Genitourinary: has foley in place  Musculoskeletal: Negative for back pain, falls Skin: Negative for itching, rash.  Neurological: positive for weakness. Negative for dizziness but has fear of falling  Psychiatric/Behavioral: Negative for depression    Past Medical History  Diagnosis Date  . History of cardiac catheterization 2002    Negative  . Vertebral fracture   . Restless legs syndrome with nocturnal myoclonus 05/13/2013     [place] controlled   . Abnormal uterine bleeding 7829,5621  . Hypothyroidism   . Arthritis   . Osteoporosis   . History of stomach ulcers   . History of jaundice as a child   . Anemia   . Head pain, chronic     "I have pain in the back of my head for months"  . Dizziness     TAKES MECLIZINE TO TREAT  . Hemiparesis and speech and language deficit as late effects of stroke 02/09/2015  . Paroxysmal atrial fibrillation    Past Surgical History  Procedure Laterality Date  . Back surgery      x 3  . Breast surgery Bilateral     1 lump each breast removed and benign  . Nasal sinus surgery    . Dilation and curettage of uterus  1970  . Catheterization  2003    cardiac cath  . Kyphoplasty  2010  . Cataract extraction Bilateral 2011  . Cholecystectomy  1995    Lap  . Cardiac catheterization  2002    "it was normal" per pt - no record found  . Robotic assisted salpingo oopherectomy Bilateral 11/02/2014    Procedure: ROBOTIC ASSISTED bilateral SALPINGO OOPHORECTOMY;  Surgeon: Everitt Amber, MD;  Location: [facility];  Service: Gynecology;  Laterality: Bilateral;  . Laparoscopy N/A 11/02/2014    Procedure: LAPAROSCOPY DIAGNOSTIC;  Surgeon: Everitt Amber, MD;  Location: [facility];  Service: Gynecology;  Laterality: N/A;  . Abdominal hysterectomy    . Total hip arthroplasty Right 05/23/2015    Procedure: HEMI-ARTHROPLASTY RIGHT HIP ANTERIOR APPROACH;  Surgeon: Leandrew Koyanagi, MD;  Location: [place];  Service:  Orthopedics;  Laterality: Right;   Social History:   reports that she has never smoked. She has never used smokeless tobacco. She reports that she does not drink alcohol or use illicit drugs.  Family History  Problem Relation Age of Onset  . Asthma Brother   . Osteoarthritis Son   . Hypertension Father   . Stroke Father   . Cancer Brother     stomach    Medications:   Medication List       This list is accurate as of: 05/26/15 12:50 PM.  Always use your most recent med list.               acetaminophen 500 MG chewable tablet  Commonly known as:  TYLENOL  Chew 1 tablet (500 mg total) by mouth every 6 (six) hours as needed for pain.     amitriptyline 10 MG tablet  Commonly known as:  ELAVIL  Take 10 mg by mouth at bedtime.     apixaban 2.5 MG Tabs tablet  Commonly known as:  ELIQUIS  Take 1 tablet (2.5 mg total) by mouth 2 (two) times daily.     CALCIUM 500 +D 500-400 MG-UNIT Tabs  Generic drug:  Calcium Carb-Cholecalciferol  Take 1 tablet by mouth 2 (two) times daily.     cholecalciferol 1000 UNITS tablet  Commonly known as:  VITAMIN D  Take 1,000 Units by mouth 2 (two) times daily.     denosumab 60 MG/ML Soln injection  Commonly known as:  PROLIA  Inject 60 mg into the skin every 6 (six) months. Administer in upper arm, thigh, or abdomen     levothyroxine 75 MCG tablet  Commonly known as:  SYNTHROID, LEVOTHROID  Take 75 mcg by mouth daily before breakfast.     methocarbamol 500 MG tablet  Commonly known as:  ROBAXIN  Take 1 tablet (500 mg total) by mouth every 6 (six) hours as needed for muscle spasms.     metoprolol tartrate 25 MG tablet  Commonly known as:  LOPRESSOR  Take 1 tablet (25 mg total) by mouth 2 (two) times daily.     multivitamin with minerals Tabs tablet  Take 0.5 tablets by mouth daily with lunch.     MYRBETRIQ 25 MG Tb24 tablet  Generic drug:  mirabegron ER  Take 25 mg by mouth daily.     pantoprazole 40 MG tablet  Commonly known  as:  PROTONIX  Take 40 mg by mouth daily.     sertraline 50 MG tablet  Commonly known as:  ZOLOFT  Take 50 mg by mouth daily.     SYSTANE BALANCE OP  Place 1 drop into both eyes daily as needed (dry eyes).     tamsulosin 0.4 MG Caps capsule  Commonly known as:  FLOMAX  Take 1 capsule (0.4 mg total) by mouth daily.  Physical Exam:  Filed Vitals:   05/26/15 1054  BP: 108/74  Pulse: 68  Temp: 97.1 F (36.2 C)  TempSrc: Oral  Resp: 16  Weight: 100 lb (45.36 kg)  SpO2: 95%    General- elderly female, frail and thin built, in no acute distress Head- normocephalic, atraumatic Nose- normal nasal mucosa, no maxillary or frontal sinus tenderness, no nasal discharge Throat- moist mucus membrane Eyes- PERRLA, EOMI, no pallor, no icterus, no discharge, normal conjunctiva, normal sclera Neck- no cervical lymphadenopathy Cardiovascular- irregularly irregular heart rate, no murmurs, no leg edema Respiratory- bilateral clear to auscultation, no wheeze, no rhonchi, no crackles, no use of accessory muscles Abdomen- bowel sounds present, soft, non tender, foley in place Musculoskeletal- chronic right sided hemiparesis, generalized weakness Neurological- aphasic, right sided hemiparesis Skin- warm and dry, SDTI to sacrum, stage 1 ulcer to mid spine area Psychiatry- normal mood and affect    Labs reviewed: Basic Metabolic Panel:  Recent Labs  03/14/15 0642  05/22/15 0556 05/24/15 0648 05/25/15 0532  NA 135  < > 138 134* 133*  K 3.7  < > 3.4* 3.3* 3.7  CL 104  < > 105 98* 100*  CO2 25  < > 26 30 27   GLUCOSE 112*  < > 131* 109* 122*  BUN 21*  < > 12 8 13   CREATININE 0.83  < > 0.90 0.84 0.87  CALCIUM 8.0*  < > 10.0 9.3 9.2  MG 1.9  --   --   --   --   PHOS 2.0*  --   --   --   --   < > = values in this interval not displayed. Liver Function Tests:  Recent Labs  03/14/15 0642 03/18/15 0848 05/21/15 1900  AST 19 28 67*  ALT 17 28 71*  ALKPHOS 76 119 163*    BILITOT 0.7 0.6 0.5  PROT 5.1* 5.8* 7.0  ALBUMIN 2.4* 2.6* 3.8    Recent Labs  03/10/15 1635  LIPASE 30   No results for input(s): AMMONIA in the last 8760 hours. CBC:  Recent Labs  03/18/15 0848 05/20/15 1825 05/21/15 1900 05/22/15 0556 05/23/15 0205 05/25/15 0532  WBC 9.2 7.9 9.8 12.1* 9.1 15.3*  NEUTROABS 7.0 5.3 7.8*  --   --   --   HGB 13.8 12.5 12.4 12.0 11.5* 8.8*  HCT 41.6 39.3 38.3 36.7 35.4* 27.5*  MCV 92.9 93.8 93.2 93.9 93.9 94.2  PLT 349 276 302 261 146* 198   Cardiac Enzymes:  Recent Labs  03/14/15 0642 03/14/15 1103  TROPONINI <0.03 <0.03   BNP: Invalid input(s): POCBNP CBG: No results for input(s): GLUCAP in the last 8760 hours.  Radiological Exams: Dg Chest 1 View  05/21/2015   CLINICAL DATA:  81 year old female with fall  EXAM: CHEST  1 VIEW  COMPARISON:  Radiograph dated 03/14/2015  FINDINGS: Single-view of the chest demonstrates emphysematous changes of the lungs. There is a focal area of opacity at the left lower lung field likely combination of small pleural effusion and subsegmental atelectatic changes of the adjacent lung. Superimposed pneumonia is not excluded. There is no pneumothorax. The cardiac silhouette is within normal limits. There is diffuse osteopenia with scoliosis and degenerative changes of the thoracic spine. Stable mid thoracic vertebroplasty changes noted. No definite acute fracture identified.  IMPRESSION: Small left pleural effusion and associated subsegmental atelectasis of the adjacent lung. No focal  Osteopenia with degenerative changes of the spine. No definite acute fracture. No pneumothorax.  Electronically Signed   By: Anner Crete M.D.   On: 05/21/2015 19:35   Dg Hip Unilat With Pelvis 2-3 Views Right  05/21/2015   CLINICAL DATA:  Fall.  Right hip pain.  Pt had just come out of shower and caregiver was assisting with getting dressed. Pt reports legs got "twisted up" and fell, denies LOC, or hitting head. No pain  other than hip, some dementia. Hx of breast surgery, cardiac catheterization.  EXAM: DG HIP (WITH OR WITHOUT PELVIS) 2-3V RIGHT  COMPARISON:  None.  FINDINGS: There is a fracture of the right femoral neck. Fracture is mid cervical. No significant comminution. There is displacement, with the distal fracture component retracting superiorly, displacing by approximately 1.8 cm. There is also varus angulation.  Right femoral head is normally aligned in the acetabulum.  Bony pelvis is intact. Left hip joint is normally spaced and aligned. Bones are diffusely demineralized.  IMPRESSION: Displaced fracture of the right femoral neck with varus angulation.   Electronically Signed   By: Lajean Manes M.D.   On: 05/21/2015 19:57    Assessment/Plan  Physical deconditioning Will have her work with physical therapy and occupational therapy team to help with gait training and muscle strengthening exercises.fall precautions. Skin care. Encourage to be out of bed. Get dietary consult to assess for supplements  Closed right hip fracture S/p surgical repair. Has f/u with orthopedics. Continue tylenol 500 mg q6h prn pain, robaxin 500 mg q6h prn muscle spasm. Continue calcium and vitamin d supplement. WBAT. Continue to work with PT/OT  Acute urinary retention S/p foley catheter at present. As per discharge note, patient tends to hold on to her urine and now with recent fracture and surgical repair, her mobility is limited and this can worsen it. Continue flomax and foley for now. Voiding trial on Monday and if unable, will place a foley and get urology consult. D/c myrbetriq  Leukocytosis No signs of infection, likely reactive leukocytosis, monitor cbc  Anemia Post op from blood loss, monitor h&h  Pressure ulcer Has stage 1 to back and SDTI to sacrum. Will need air matress, floaters to heels and skin care with pressure ulcer prophylaxis  Hyponatremia Monitor bmp  Protein calorie malnutrition Will need dietary  supplement, assist with feeding, aspiration precautions, monitor weight  afib Rate currently controlled. Continue lopressor 25 mg bid for rate control and eliquis 2.5 mg bid for anticoagulation  cva with right hemiparesis Continue her lopressor and eliquis, to work with therapy team, assistance with ADLs as needed  Osteoporosis Continue prolia injection q6 month, ca-vit d supplement, fall prevention  Depression Continue elavil 10 mg daily and zoloft 50 mg daily, monitor mood  gerd Stable at present, continue protonix 40 mg daily  Hypothyroidism Continue levothyroxine 75 mcg daily for now  Goals of care: short term rehabilitation   Labs/tests ordered: cbc, cmp 1 week  Family/ staff Communication: reviewed care plan with patient and nursing supervisor    Blanchie Serve, MD  [facility] [phone] (Monday-Friday 8 am - 5 pm) [phone] (afterhours)

## 2015-06-02 ENCOUNTER — Ambulatory Visit: Payer: Medicare Other | Admitting: Nurse Practitioner

## 2015-06-06 DIAGNOSIS — S72002D Fracture of unspecified part of neck of left femur, subsequent encounter for closed fracture with routine healing: Secondary | ICD-10-CM | POA: Diagnosis not present

## 2015-06-06 DIAGNOSIS — S72002A Fracture of unspecified part of neck of left femur, initial encounter for closed fracture: Secondary | ICD-10-CM | POA: Diagnosis not present

## 2015-06-06 LAB — CBC AND DIFFERENTIAL
HCT: 27 % — AB (ref 36–46)
HEMOGLOBIN: 8.3 g/dL — AB (ref 12.0–16.0)
PLATELETS: 611 10*3/uL — AB (ref 150–399)
WBC: 13.1 10*3/mL

## 2015-06-08 ENCOUNTER — Non-Acute Institutional Stay (SKILLED_NURSING_FACILITY): Payer: Medicare Other | Admitting: Internal Medicine

## 2015-06-08 DIAGNOSIS — N390 Urinary tract infection, site not specified: Secondary | ICD-10-CM

## 2015-06-08 DIAGNOSIS — D72829 Elevated white blood cell count, unspecified: Secondary | ICD-10-CM | POA: Diagnosis not present

## 2015-06-08 DIAGNOSIS — D62 Acute posthemorrhagic anemia: Secondary | ICD-10-CM | POA: Diagnosis not present

## 2015-06-08 DIAGNOSIS — B9620 Unspecified Escherichia coli [E. coli] as the cause of diseases classified elsewhere: Secondary | ICD-10-CM | POA: Diagnosis not present

## 2015-06-08 DIAGNOSIS — I959 Hypotension, unspecified: Secondary | ICD-10-CM

## 2015-06-08 NOTE — Progress Notes (Signed)
Patient ID: Linda [name], female   DOB: [birth year], 81 y.o.   MRN: [id number]     Facility: [facility]    PCP: [name], MD  Code Status:FULL CODE  Allergies  Allergen Reactions  . Ibuprofen Anaphylaxis    Passed out and had to be hospitalized  . Lyrica [Pregabalin] Other (See Comments)    "made me a zombie'  . Penicillins Other (See Comments)    Skin on head was crawling and very very sleepy  . Demerol [Meperidine Hcl] Nausea And Vomiting  . Dilaudid [Hydromorphone Hcl] Nausea And Vomiting  . Morphine And Related Nausea And Vomiting  . Percocet [Oxycodone-Acetaminophen] Nausea And Vomiting  . Vicodin [Hydrocodone-Acetaminophen] Nausea And Vomiting  . Fentanyl Other (See Comments)    unknown  . Lidoderm [Lidocaine] Other (See Comments)    unknown  . Other Other (See Comments)    hamburger  . Tramadol Other (See Comments)    unknown    Chief Complaint  Patient presents with  . Acute Visit    Acute Visit-Increased wbc and dirty u/a     HPI:  81 y.o. patient is seen for acute concerns. She has been having excessive weakness and lethargy x 2 days per her caregiver. Her labs on review shows elevated white blood cell count and dirty u/a with urine culture growing e.coli. She is lying in her bed. She complaints of lower abdominal discomfort but denies any other concerns. She is here for short term rehabilitation post hospital admission with closed right hip fracture and is s/p right hip hemiarthroplasty. She has PMH of CVA with right hemiparesis, aphasia, afib, HTN, osteoporosis among others. She is aphasic mostly but can repeat few words after me. She answers to most of the question by moving her head.   Review of Systems:  Constitutional: positive for fatigue. Negative for fever, chills, diaphoresis.  HENT: Negative for headache, congestion, nasal discharge   Respiratory: Negative for cough, shortness of breath and wheezing.     Cardiovascular: Negative for chest pain, palpitations, leg swelling.  Gastrointestinal: Negative for heartburn, nausea, vomiting. Musculoskeletal: Negative for back pain, falls Skin: Negative for itching, rash.    Past Medical History  Diagnosis Date  . History of cardiac catheterization 2002    Negative  . Vertebral fracture   . Restless legs syndrome with nocturnal myoclonus 05/13/2013     [place] controlled   . Abnormal uterine bleeding 7482,7078  . Hypothyroidism   . Arthritis   . Osteoporosis   . History of stomach ulcers   . History of jaundice as a child   . Anemia   . Head pain, chronic     "I have pain in the back of my head for months"  . Dizziness     TAKES MECLIZINE TO TREAT  . Hemiparesis and speech and language deficit as late effects of stroke 02/09/2015  . Paroxysmal atrial fibrillation      Medications:   Medication List       This list is accurate as of: 06/08/15  3:23 PM.  Always use your most recent med list.               acetaminophen 500 MG chewable tablet  Commonly known as:  TYLENOL  Chew 1 tablet (500 mg total) by mouth every 6 (six) hours as needed for pain.     amitriptyline 10 MG tablet  Commonly known as:  ELAVIL  Take one tablet by mouth at bedtime  for depression     apixaban 2.5 MG Tabs tablet  Commonly known as:  ELIQUIS  Take 1 tablet (2.5 mg total) by mouth 2 (two) times daily.     CALCIUM 500 +D 500-400 MG-UNIT Tabs  Generic drug:  Calcium Carb-Cholecalciferol  Take 1 tablet by mouth 2 (two) times daily.     cholecalciferol 1000 UNITS tablet  Commonly known as:  VITAMIN D  Take 1,000 Units by mouth 2 (two) times daily.     ciprofloxacin 500 MG tablet  Commonly known as:  CIPRO  Take one tablet by mouth twice daily for 7 days for UTI     denosumab 60 MG/ML Soln injection  Commonly known as:  PROLIA  Inject 60 mg into the skin every 6 (six) months. Administer in upper arm, thigh, or abdomen     levothyroxine 75 MCG  tablet  Commonly known as:  SYNTHROID, LEVOTHROID  Take one tablet by mouth once daily before breakfast for thyroid     methocarbamol 500 MG tablet  Commonly known as:  ROBAXIN  Take 1 tablet (500 mg total) by mouth every 6 (six) hours as needed for muscle spasms.     metoprolol tartrate 25 MG tablet  Commonly known as:  LOPRESSOR  Take 1 tablet (25 mg total) by mouth 2 (two) times daily.     multivitamin with minerals Tabs tablet  Take one tablet by mouth once daily with lunch     pantoprazole 40 MG tablet  Commonly known as:  PROTONIX  Take one tablet by mouth once daily for GERD     saccharomyces boulardii 250 MG capsule  Commonly known as:  FLORASTOR  Take one tablet by mouth twice daily for 2 weeks for E.Coli and UTI     sertraline 50 MG tablet  Commonly known as:  ZOLOFT  Take one tablet by mouth once daily for depression     SYSTANE BALANCE OP  Place 1 drop into both eyes daily as needed (dry eyes).     tamsulosin 0.4 MG Caps capsule  Commonly known as:  FLOMAX  Take 1 capsule (0.4 mg total) by mouth daily.         Physical Exam:  Filed Vitals:   06/08/15 1431  BP: 96/60  Pulse: 72  Temp: 96.8 F (36 C)  TempSrc: Oral  Resp: 18  Height: 5\' 2"  (1.575 m)  Weight: 100 lb (45.36 kg)  SpO2: 97%   General- elderly female, frail and thin built, in no acute distress Head- normocephalic, atraumatic Throat- moist mucus membrane Eyes- PERRLA, EOMI, no pallor, no icterus, no discharge, normal conjunctiva, normal sclera Neck- no cervical lymphadenopathy Cardiovascular- irregularly irregular heart rate, no murmurs, no leg edema Respiratory- bilateral clear to auscultation, no wheeze, no rhonchi, no crackles, no use of accessory muscles Abdomen- bowel sounds present, soft, suprapubic tenderness Musculoskeletal- chronic right sided hemiparesis, generalized weakness Neurological- aphasic, right sided hemiparesis Skin- warm and dry   Labs reviewed: Basic  Metabolic Panel:  Recent Labs  03/14/15 0642  05/22/15 0556 05/24/15 0648 05/25/15 0532  NA 135  < > 138 134* 133*  K 3.7  < > 3.4* 3.3* 3.7  CL 104  < > 105 98* 100*  CO2 25  < > 26 30 27   GLUCOSE 112*  < > 131* 109* 122*  BUN 21*  < > 12 8 13   CREATININE 0.83  < > 0.90 0.84 0.87  CALCIUM 8.0*  < > 10.0 9.3 9.2  MG 1.9  --   --   --   --   PHOS 2.0*  --   --   --   --   < > = values in this interval not displayed. Liver Function Tests:  Recent Labs  03/14/15 0642 03/18/15 0848 05/21/15 1900  AST 19 28 67*  ALT 17 28 71*  ALKPHOS 76 119 163*  BILITOT 0.7 0.6 0.5  PROT 5.1* 5.8* 7.0  ALBUMIN 2.4* 2.6* 3.8   CBC:  Recent Labs  03/18/15 0848 05/20/15 1825 05/21/15 1900 05/22/15 0556 05/23/15 0205 05/25/15 0532 06/06/15  WBC 9.2 7.9 9.8 12.1* 9.1 15.3* 13.1  NEUTROABS 7.0 5.3 7.8*  --   --   --   --   HGB 13.8 12.5 12.4 12.0 11.5* 8.8* 8.3*  HCT 41.6 39.3 38.3 36.7 35.4* 27.5* 27*  MCV 92.9 93.8 93.2 93.9 93.9 94.2  --   PLT 349 276 302 261 146* 198 611*     Assessment/Plan  E.coli uti Reviewed culture and sensitivity report. Reviewed renal function start ciprofloxacin 500 mg q12h x 1 week with florastor 250 mg bid x 2 weeks. Start pyridium 200 mg tid with meals for 3 days. Hydration to be encouraged  Leukocytosis Likely from UTI. Monitor clinically with her being started on antibiotics.   Blood loss anemia Slight drop in hb from time of discharge. Currently on eliquis. Check guaiac stool. If + will need to hold eliquis. Start ferrous sulfate 325 mg po bid for now. Check cbc in 1 week  Hypotension Low bp reading, currently asymptomatic, decrease metoprolol to 12.5 mg bid with holding paraeters and check bp q shift x 1 week to assess further   Blanchie Serve, MD  [facility] [phone] (Monday-Friday 8 am - 5 pm) [phone] (afterhours)

## 2015-06-11 ENCOUNTER — Other Ambulatory Visit: Payer: Self-pay | Admitting: Neurology

## 2015-06-15 LAB — CBC AND DIFFERENTIAL
HCT: 28 % — AB (ref 36–46)
Hemoglobin: 8.8 g/dL — AB (ref 12.0–16.0)
PLATELETS: 319 10*3/uL (ref 150–399)
WBC: 9.5 10*3/mL

## 2015-06-27 ENCOUNTER — Non-Acute Institutional Stay (SKILLED_NURSING_FACILITY): Payer: Medicare Other | Admitting: Nurse Practitioner

## 2015-06-27 ENCOUNTER — Encounter: Payer: Self-pay | Admitting: Nurse Practitioner

## 2015-06-27 DIAGNOSIS — I63512 Cerebral infarction due to unspecified occlusion or stenosis of left middle cerebral artery: Secondary | ICD-10-CM | POA: Diagnosis not present

## 2015-06-27 DIAGNOSIS — K219 Gastro-esophageal reflux disease without esophagitis: Secondary | ICD-10-CM

## 2015-06-27 DIAGNOSIS — B9620 Unspecified Escherichia coli [E. coli] as the cause of diseases classified elsewhere: Secondary | ICD-10-CM | POA: Diagnosis not present

## 2015-06-27 DIAGNOSIS — N390 Urinary tract infection, site not specified: Secondary | ICD-10-CM

## 2015-06-27 DIAGNOSIS — R338 Other retention of urine: Secondary | ICD-10-CM | POA: Diagnosis not present

## 2015-06-27 DIAGNOSIS — I480 Paroxysmal atrial fibrillation: Secondary | ICD-10-CM | POA: Diagnosis not present

## 2015-06-27 DIAGNOSIS — E039 Hypothyroidism, unspecified: Secondary | ICD-10-CM | POA: Diagnosis not present

## 2015-06-27 DIAGNOSIS — S72141A Displaced intertrochanteric fracture of right femur, initial encounter for closed fracture: Secondary | ICD-10-CM

## 2015-06-27 DIAGNOSIS — F32A Depression, unspecified: Secondary | ICD-10-CM

## 2015-06-27 DIAGNOSIS — M810 Age-related osteoporosis without current pathological fracture: Secondary | ICD-10-CM

## 2015-06-27 DIAGNOSIS — F329 Major depressive disorder, single episode, unspecified: Secondary | ICD-10-CM

## 2015-06-27 DIAGNOSIS — D62 Acute posthemorrhagic anemia: Secondary | ICD-10-CM

## 2015-06-27 NOTE — Progress Notes (Signed)
Nursing Home Location:  [facility]   Place of Service: SNF (57)  PCP: [name], MD  Allergies  Allergen Reactions  . Ibuprofen Anaphylaxis    Passed out and had to be hospitalized  . Lyrica [Pregabalin] Other (See Comments)    "made me a zombie'  . Penicillins Other (See Comments)    Skin on head was crawling and very very sleepy  . Demerol [Meperidine Hcl] Nausea And Vomiting  . Dilaudid [Hydromorphone Hcl] Nausea And Vomiting  . Morphine And Related Nausea And Vomiting  . Percocet [Oxycodone-Acetaminophen] Nausea And Vomiting  . Vicodin [Hydrocodone-Acetaminophen] Nausea And Vomiting  . Fentanyl Other (See Comments)    unknown  . Lidoderm [Lidocaine] Other (See Comments)    unknown  . Other Other (See Comments)    hamburger  . Tramadol Other (See Comments)    unknown    Chief Complaint  Patient presents with  . Discharge Note    HPI:  Patient is a 81 y.o. female seen today at [facility] for discharge home. She has PMH of CVA with right hemiparesis, aphasia, afib, HTN, osteoporosis. Pt is at [facility] for short term rehabilitation post hospital admission from 05/21/15-05/25/15 post fall with closed right hip fracture. She underwent right hip hemiarthroplasty. She developed acute urinary retention and had a foley catheter placed, currently able to void. Pt with 24 hour caregivers at home. Caregiver at bedside. Patient currently doing well with therapy, now stable to discharge home with home health.  Review of Systems:  Review of Systems  Constitutional: Negative for activity change, appetite change, fatigue and unexpected weight change.  HENT: Negative for congestion and hearing loss.   Eyes: Negative.   Respiratory: Negative for cough and shortness of breath.   Cardiovascular: Negative for chest pain, palpitations and leg swelling.  Gastrointestinal: Negative for abdominal pain, diarrhea and constipation.    Genitourinary: Negative for dysuria and difficulty urinating.  Musculoskeletal: Negative for myalgias and arthralgias.  Skin: Negative for color change and wound.  Neurological: Negative for dizziness and weakness.  Psychiatric/Behavioral: Negative for behavioral problems, confusion and agitation.    Past Medical History  Diagnosis Date  . History of cardiac catheterization 2002    Negative  . Vertebral fracture   . Restless legs syndrome with nocturnal myoclonus 05/13/2013     requip controlled   . Abnormal uterine bleeding 0923,3007  . Hypothyroidism   . Arthritis   . Osteoporosis   . History of stomach ulcers   . History of jaundice as a child   . Anemia   . Head pain, chronic     "I have pain in the back of my head for months"  . Dizziness     TAKES MECLIZINE TO TREAT  . Hemiparesis and speech and language deficit as late effects of stroke ([place]) 02/09/2015  . Paroxysmal atrial fibrillation [facility])    Past Surgical History  Procedure Laterality Date  . Back surgery      x 3  . Breast surgery Bilateral     1 lump each breast removed and benign  . Nasal sinus surgery    . Dilation and curettage of uterus  1970  . Catheterization  2003    cardiac cath  . Kyphoplasty  2010  . Cataract extraction Bilateral 2011  . Cholecystectomy  1995    Lap  . Cardiac catheterization  2002    "it was normal" per pt - no record  found  . Robotic assisted salpingo oopherectomy Bilateral 11/02/2014    Procedure: ROBOTIC ASSISTED bilateral SALPINGO OOPHORECTOMY;  Surgeon: Everitt Amber, MD;  Location: [facility];  Service: Gynecology;  Laterality: Bilateral;  . Laparoscopy N/A 11/02/2014    Procedure: LAPAROSCOPY DIAGNOSTIC;  Surgeon: Everitt Amber, MD;  Location: [facility];  Service: Gynecology;  Laterality: N/A;  . Abdominal hysterectomy    . Total hip arthroplasty Right 05/23/2015    Procedure: HEMI-ARTHROPLASTY RIGHT HIP ANTERIOR APPROACH;  Surgeon: Leandrew Koyanagi, MD;  Location: [place];  Service:  Orthopedics;  Laterality: Right;   Social History:   reports that she has never smoked. She has never used smokeless tobacco. She reports that she does not drink alcohol or use illicit drugs.  Family History  Problem Relation Age of Onset  . Asthma Brother   . Osteoarthritis Son   . Hypertension Father   . Stroke Father   . Cancer Brother     stomach    Medications: Patient's Medications  New Prescriptions   No medications on file  Previous Medications   ACETAMINOPHEN (TYLENOL) 500 MG CHEWABLE TABLET    Chew 1 tablet (500 mg total) by mouth every 6 (six) hours as needed for pain.   AMITRIPTYLINE (ELAVIL) 10 MG TABLET    Take one tablet by mouth at bedtime for depression   CALCIUM CARB-CHOLECALCIFEROL (CALCIUM 500 +D) 500-400 MG-UNIT TABS    Take 1 tablet by mouth 2 (two) times daily.   CHOLECALCIFEROL (VITAMIN D) 1000 UNITS TABLET    Take 1,000 Units by mouth 2 (two) times daily.    DENOSUMAB (PROLIA) 60 MG/ML SOLN INJECTION    Inject 60 mg into the skin every 6 (six) months. Administer in upper arm, thigh, or abdomen   ELIQUIS 2.5 MG TABS TABLET    TK 1 T PO BID   LEVOTHYROXINE (SYNTHROID, LEVOTHROID) 75 MCG TABLET    Take one tablet by mouth once daily before breakfast for thyroid   METHOCARBAMOL (ROBAXIN) 500 MG TABLET    Take 1 tablet (500 mg total) by mouth every 6 (six) hours as needed for muscle spasms.   METOPROLOL TARTRATE (LOPRESSOR) 25 MG TABLET    Take 1 tablet (25 mg total) by mouth 2 (two) times daily.   MULTIPLE VITAMIN (MULTIVITAMIN WITH MINERALS) TABS TABLET    Take one tablet by mouth once daily with lunch   PANTOPRAZOLE (PROTONIX) 40 MG TABLET    Take one tablet by mouth once daily for GERD   PROPYLENE GLYCOL (SYSTANE BALANCE OP)    Place 1 drop into both eyes daily as needed (dry eyes).    SACCHAROMYCES BOULARDII (FLORASTOR) 250 MG CAPSULE    Take one tablet by mouth twice daily for 2 weeks for E.Coli and UTI   SERTRALINE (ZOLOFT) 50 MG TABLET    Take one tablet  by mouth once daily for depression   TAMSULOSIN (FLOMAX) 0.4 MG CAPS CAPSULE    Take 1 capsule (0.4 mg total) by mouth daily.  Modified Medications   No medications on file  Discontinued Medications   CIPROFLOXACIN (CIPRO) 500 MG TABLET    Take one tablet by mouth twice daily for 7 days for UTI   ELIQUIS 5 MG TABS TABLET    TAKE 1 TABLET BY MOUTH TWICE DAILY.*DISCONTINUE ASPIRIN ONCE STARTED ON ELIQUIS*     Physical Exam: Filed Vitals:   06/27/15 1605  BP: 116/64  Pulse: 63  Temp: 96.7 F (35.9 C)  Resp: 18  SpO2:  97%    Physical Exam  Constitutional: No distress.  Frail elderly female  HENT:  Head: Normocephalic and atraumatic.  Mouth/Throat: Oropharynx is clear and moist. No oropharyngeal exudate.  Eyes: Conjunctivae are normal. Pupils are equal, round, and reactive to light.  Neck: Normal range of motion. Neck supple.  Cardiovascular: Normal rate, regular rhythm and normal heart sounds.   Pulmonary/Chest: Effort normal and breath sounds normal.  Abdominal: Soft. Bowel sounds are normal.  Musculoskeletal: She exhibits no edema or tenderness.  Neurological: She is alert.  Aphagic, right sided hemiparesis  Skin: Skin is warm and dry. She is not diaphoretic.  Well healed right hip incision   Psychiatric: She has a normal mood and affect.    Labs reviewed: Basic Metabolic Panel:  Recent Labs  03/14/15 0642  05/22/15 0556 05/24/15 0648 05/25/15 0532  NA 135  < > 138 134* 133*  K 3.7  < > 3.4* 3.3* 3.7  CL 104  < > 105 98* 100*  CO2 25  < > 26 30 27   GLUCOSE 112*  < > 131* 109* 122*  BUN 21*  < > 12 8 13   CREATININE 0.83  < > 0.90 0.84 0.87  CALCIUM 8.0*  < > 10.0 9.3 9.2  MG 1.9  --   --   --   --   PHOS 2.0*  --   --   --   --   < > = values in this interval not displayed. Liver Function Tests:  Recent Labs  03/14/15 0642 03/18/15 0848 05/21/15 1900  AST 19 28 67*  ALT 17 28 71*  ALKPHOS 76 119 163*  BILITOT 0.7 0.6 0.5  PROT 5.1* 5.8* 7.0    ALBUMIN 2.4* 2.6* 3.8    Recent Labs  03/10/15 1635  LIPASE 30   No results for input(s): AMMONIA in the last 8760 hours. CBC:  Recent Labs  03/18/15 0848 05/20/15 1825 05/21/15 1900 05/22/15 0556 05/23/15 0205 05/25/15 0532 06/06/15 06/15/15  WBC 9.2 7.9 9.8 12.1* 9.1 15.3* 13.1 9.5  NEUTROABS 7.0 5.3 7.8*  --   --   --   --   --   HGB 13.8 12.5 12.4 12.0 11.5* 8.8* 8.3* 8.8*  HCT 41.6 39.3 38.3 36.7 35.4* 27.5* 27* 28*  MCV 92.9 93.8 93.2 93.9 93.9 94.2  --   --   PLT 349 276 302 261 146* 198 611* 319   TSH:  Recent Labs  03/13/15 2340 05/24/15 0648  TSH 5.296* 1.319   A1C: Lab Results  Component Value Date   HGBA1C 5.9* 12/26/2014   Lipid Panel:  Recent Labs  12/26/14 0732  CHOL 113  HDL 49  LDLCALC 52  TRIG 58  CHOLHDL 2.3     Assessment/Plan 1. E-coli UTI Resolved, completed cipro for 7 days.   2. Intertrochanteric fracture of right hip, closed, initial encounter [facility]) S/p surgical repair. Pain well controlled on tylenol 500 mg q6h prn pain, robaxin 500 mg q6h prn muscle spasm. -ongoing ortho follow up  3. Acute blood loss anemia hgb stable, will cont iron twice daily, will need ongoing follow up by PCP  4. Paroxysmal atrial fibrillation (HCC) Rate controlled, cont metoprolol 12.5 mg BID, and eliquis 2.5 mg BID for anticoagulation   5. Acute urinary retention Stable, conts on flomax  6. CVA with right hemiparesis Stable, Continued on eliquis  7. Osteoporosis  Continue prolia injection q6 month, ca-vit d supplement,ongoing fall prevention  8. Depression Continue elavil  10 mg daily and zoloft 50 mg daily  9. GERD Stable at present, continue protonix 40 mg daily  10. Hypothyroidism Continue levothyroxine 75 mcg daily  pt is stable for discharge-will need PT/OT/HHA per home health. No DME needed. Rx written.  will need to follow up with PCP within 2 weeks.    Carlos American. Harle Battiest  [facility] [phone] 8 am - 5 pm) [phone] (after hours)

## 2015-07-03 DIAGNOSIS — E43 Unspecified severe protein-calorie malnutrition: Secondary | ICD-10-CM | POA: Diagnosis not present

## 2015-07-03 DIAGNOSIS — Z471 Aftercare following joint replacement surgery: Secondary | ICD-10-CM | POA: Diagnosis not present

## 2015-07-03 DIAGNOSIS — Z96641 Presence of right artificial hip joint: Secondary | ICD-10-CM | POA: Diagnosis not present

## 2015-07-03 DIAGNOSIS — I10 Essential (primary) hypertension: Secondary | ICD-10-CM | POA: Diagnosis not present

## 2015-07-03 DIAGNOSIS — I69320 Aphasia following cerebral infarction: Secondary | ICD-10-CM | POA: Diagnosis not present

## 2015-07-03 DIAGNOSIS — I69351 Hemiplegia and hemiparesis following cerebral infarction affecting right dominant side: Secondary | ICD-10-CM | POA: Diagnosis not present

## 2015-07-03 DIAGNOSIS — Z7901 Long term (current) use of anticoagulants: Secondary | ICD-10-CM | POA: Diagnosis not present

## 2015-07-03 DIAGNOSIS — Z9181 History of falling: Secondary | ICD-10-CM | POA: Diagnosis not present

## 2015-07-03 DIAGNOSIS — I482 Chronic atrial fibrillation: Secondary | ICD-10-CM | POA: Diagnosis not present

## 2015-07-03 DIAGNOSIS — F329 Major depressive disorder, single episode, unspecified: Secondary | ICD-10-CM | POA: Diagnosis not present

## 2015-07-04 DIAGNOSIS — Z96641 Presence of right artificial hip joint: Secondary | ICD-10-CM | POA: Diagnosis not present

## 2015-07-04 DIAGNOSIS — I10 Essential (primary) hypertension: Secondary | ICD-10-CM | POA: Diagnosis not present

## 2015-07-04 DIAGNOSIS — I69351 Hemiplegia and hemiparesis following cerebral infarction affecting right dominant side: Secondary | ICD-10-CM | POA: Diagnosis not present

## 2015-07-04 DIAGNOSIS — I482 Chronic atrial fibrillation: Secondary | ICD-10-CM | POA: Diagnosis not present

## 2015-07-04 DIAGNOSIS — I69320 Aphasia following cerebral infarction: Secondary | ICD-10-CM | POA: Diagnosis not present

## 2015-07-04 DIAGNOSIS — Z471 Aftercare following joint replacement surgery: Secondary | ICD-10-CM | POA: Diagnosis not present

## 2015-07-05 ENCOUNTER — Encounter (HOSPITAL_COMMUNITY): Payer: Self-pay | Admitting: Emergency Medicine

## 2015-07-05 ENCOUNTER — Emergency Department (HOSPITAL_COMMUNITY): Payer: Medicare Other

## 2015-07-05 ENCOUNTER — Inpatient Hospital Stay (HOSPITAL_COMMUNITY): Payer: Medicare Other

## 2015-07-05 ENCOUNTER — Inpatient Hospital Stay (HOSPITAL_COMMUNITY)
Admission: EM | Admit: 2015-07-05 | Discharge: 2015-07-07 | DRG: 187 | Disposition: A | Discharge status: Home Health | Payer: Medicare Other | Attending: Family Medicine | Admitting: Family Medicine

## 2015-07-05 DIAGNOSIS — Z888 Allergy status to other drugs, medicaments and biological substances status: Secondary | ICD-10-CM

## 2015-07-05 DIAGNOSIS — M1990 Unspecified osteoarthritis, unspecified site: Secondary | ICD-10-CM | POA: Diagnosis present

## 2015-07-05 DIAGNOSIS — I480 Paroxysmal atrial fibrillation: Secondary | ICD-10-CM | POA: Diagnosis present

## 2015-07-05 DIAGNOSIS — R404 Transient alteration of awareness: Secondary | ICD-10-CM | POA: Diagnosis not present

## 2015-07-05 DIAGNOSIS — Z800 Family history of malignant neoplasm of digestive organs: Secondary | ICD-10-CM

## 2015-07-05 DIAGNOSIS — Z9049 Acquired absence of other specified parts of digestive tract: Secondary | ICD-10-CM | POA: Diagnosis not present

## 2015-07-05 DIAGNOSIS — R0600 Dyspnea, unspecified: Secondary | ICD-10-CM | POA: Diagnosis not present

## 2015-07-05 DIAGNOSIS — Z825 Family history of asthma and other chronic lower respiratory diseases: Secondary | ICD-10-CM

## 2015-07-05 DIAGNOSIS — Z885 Allergy status to narcotic agent status: Secondary | ICD-10-CM

## 2015-07-05 DIAGNOSIS — Z8711 Personal history of peptic ulcer disease: Secondary | ICD-10-CM

## 2015-07-05 DIAGNOSIS — R0902 Hypoxemia: Secondary | ICD-10-CM | POA: Diagnosis not present

## 2015-07-05 DIAGNOSIS — Z886 Allergy status to analgesic agent status: Secondary | ICD-10-CM | POA: Diagnosis not present

## 2015-07-05 DIAGNOSIS — E039 Hypothyroidism, unspecified: Secondary | ICD-10-CM | POA: Diagnosis present

## 2015-07-05 DIAGNOSIS — G253 Myoclonus: Secondary | ICD-10-CM | POA: Diagnosis present

## 2015-07-05 DIAGNOSIS — R531 Weakness: Secondary | ICD-10-CM | POA: Diagnosis not present

## 2015-07-05 DIAGNOSIS — R627 Adult failure to thrive: Secondary | ICD-10-CM | POA: Diagnosis present

## 2015-07-05 DIAGNOSIS — J90 Pleural effusion, not elsewhere classified: Secondary | ICD-10-CM

## 2015-07-05 DIAGNOSIS — Z79899 Other long term (current) drug therapy: Secondary | ICD-10-CM

## 2015-07-05 DIAGNOSIS — J449 Chronic obstructive pulmonary disease, unspecified: Secondary | ICD-10-CM | POA: Diagnosis present

## 2015-07-05 DIAGNOSIS — R05 Cough: Secondary | ICD-10-CM | POA: Diagnosis not present

## 2015-07-05 DIAGNOSIS — I69320 Aphasia following cerebral infarction: Secondary | ICD-10-CM | POA: Diagnosis not present

## 2015-07-05 DIAGNOSIS — R55 Syncope and collapse: Secondary | ICD-10-CM

## 2015-07-05 DIAGNOSIS — Z823 Family history of stroke: Secondary | ICD-10-CM | POA: Diagnosis not present

## 2015-07-05 DIAGNOSIS — M810 Age-related osteoporosis without current pathological fracture: Secondary | ICD-10-CM | POA: Diagnosis present

## 2015-07-05 DIAGNOSIS — Z880 Allergy status to penicillin: Secondary | ICD-10-CM

## 2015-07-05 DIAGNOSIS — G2581 Restless legs syndrome: Secondary | ICD-10-CM | POA: Diagnosis present

## 2015-07-05 DIAGNOSIS — M6281 Muscle weakness (generalized): Secondary | ICD-10-CM | POA: Diagnosis not present

## 2015-07-05 DIAGNOSIS — I69359 Hemiplegia and hemiparesis following cerebral infarction affecting unspecified side: Secondary | ICD-10-CM

## 2015-07-05 DIAGNOSIS — Z7901 Long term (current) use of anticoagulants: Secondary | ICD-10-CM

## 2015-07-05 DIAGNOSIS — Z8249 Family history of ischemic heart disease and other diseases of the circulatory system: Secondary | ICD-10-CM | POA: Diagnosis not present

## 2015-07-05 DIAGNOSIS — I5030 Unspecified diastolic (congestive) heart failure: Secondary | ICD-10-CM | POA: Diagnosis present

## 2015-07-05 DIAGNOSIS — E876 Hypokalemia: Secondary | ICD-10-CM | POA: Diagnosis present

## 2015-07-05 HISTORY — DX: Pleural effusion, not elsewhere classified: J90

## 2015-07-05 LAB — URINALYSIS, ROUTINE W REFLEX MICROSCOPIC
Bilirubin Urine: NEGATIVE
GLUCOSE, UA: NEGATIVE mg/dL
HGB URINE DIPSTICK: NEGATIVE
Ketones, ur: NEGATIVE mg/dL
LEUKOCYTES UA: NEGATIVE
NITRITE: NEGATIVE
PH: 6.5 (ref 5.0–8.0)
Protein, ur: NEGATIVE mg/dL
Specific Gravity, Urine: 1.012 (ref 1.005–1.030)
UROBILINOGEN UA: 0.2 mg/dL (ref 0.0–1.0)

## 2015-07-05 LAB — CBC
HCT: 34.1 % — ABNORMAL LOW (ref 36.0–46.0)
Hemoglobin: 10.8 g/dL — ABNORMAL LOW (ref 12.0–15.0)
MCH: 30.9 pg (ref 26.0–34.0)
MCHC: 31.7 g/dL (ref 30.0–36.0)
MCV: 97.7 fL (ref 78.0–100.0)
PLATELETS: 298 10*3/uL (ref 150–400)
RBC: 3.49 MIL/uL — AB (ref 3.87–5.11)
RDW: 14.5 % (ref 11.5–15.5)
WBC: 9.2 10*3/uL (ref 4.0–10.5)

## 2015-07-05 LAB — I-STAT CHEM 8, ED
BUN: 28 mg/dL — ABNORMAL HIGH (ref 6–20)
CALCIUM ION: 1.41 mmol/L — AB (ref 1.13–1.30)
Chloride: 100 mmol/L — ABNORMAL LOW (ref 101–111)
Creatinine, Ser: 1 mg/dL (ref 0.44–1.00)
GLUCOSE: 96 mg/dL (ref 65–99)
HCT: 34 % — ABNORMAL LOW (ref 36.0–46.0)
HEMOGLOBIN: 11.6 g/dL — AB (ref 12.0–15.0)
Potassium: 3.6 mmol/L (ref 3.5–5.1)
Sodium: 138 mmol/L (ref 135–145)
TCO2: 27 mmol/L (ref 0–100)

## 2015-07-05 LAB — DIFFERENTIAL
Basophils Absolute: 0.1 10*3/uL (ref 0.0–0.1)
Basophils Relative: 1 %
EOS PCT: 4 %
Eosinophils Absolute: 0.4 10*3/uL (ref 0.0–0.7)
LYMPHS PCT: 16 %
Lymphs Abs: 1.5 10*3/uL (ref 0.7–4.0)
MONO ABS: 0.7 10*3/uL (ref 0.1–1.0)
MONOS PCT: 8 %
Neutro Abs: 6.5 10*3/uL (ref 1.7–7.7)
Neutrophils Relative %: 71 %

## 2015-07-05 LAB — RAPID URINE DRUG SCREEN, HOSP PERFORMED
Amphetamines: NOT DETECTED
Barbiturates: NOT DETECTED
Benzodiazepines: NOT DETECTED
Cocaine: NOT DETECTED
OPIATES: NOT DETECTED
Tetrahydrocannabinol: NOT DETECTED

## 2015-07-05 LAB — COMPREHENSIVE METABOLIC PANEL
ALK PHOS: 151 U/L — AB (ref 38–126)
ALT: 26 U/L (ref 14–54)
ANION GAP: 7 (ref 5–15)
AST: 35 U/L (ref 15–41)
Albumin: 3.6 g/dL (ref 3.5–5.0)
BILIRUBIN TOTAL: 0.7 mg/dL (ref 0.3–1.2)
BUN: 27 mg/dL — ABNORMAL HIGH (ref 6–20)
CALCIUM: 11.2 mg/dL — AB (ref 8.9–10.3)
CO2: 30 mmol/L (ref 22–32)
Chloride: 100 mmol/L — ABNORMAL LOW (ref 101–111)
Creatinine, Ser: 0.89 mg/dL (ref 0.44–1.00)
GFR, EST NON AFRICAN AMERICAN: 58 mL/min — AB (ref >60)
Glucose, Bld: 97 mg/dL (ref 65–99)
Potassium: 3.6 mmol/L (ref 3.5–5.1)
SODIUM: 137 mmol/L (ref 135–145)
TOTAL PROTEIN: 6.8 g/dL (ref 6.5–8.1)

## 2015-07-05 LAB — I-STAT CG4 LACTIC ACID, ED
Lactic Acid, Venous: 1.96 mmol/L (ref 0.5–2.0)
Lactic Acid, Venous: 1.16 mmol/L (ref 0.5–2.0)

## 2015-07-05 LAB — I-STAT TROPONIN, ED: TROPONIN I, POC: 0.02 ng/mL (ref 0.00–0.08)

## 2015-07-05 LAB — APTT: aPTT: 30 seconds (ref 24–37)

## 2015-07-05 LAB — PROTIME-INR
INR: 1.3 (ref 0.00–1.49)
PROTHROMBIN TIME: 16.4 s — AB (ref 11.6–15.2)

## 2015-07-05 MED ORDER — SODIUM CHLORIDE 0.9 % IJ SOLN
3.0000 mL | Freq: Two times a day (BID) | INTRAMUSCULAR | Status: DC
  Administered 2015-07-05 – 2015-07-07 (×3): 3 mL via INTRAVENOUS

## 2015-07-05 MED ORDER — ADULT MULTIVITAMIN W/MINERALS CH
0.5000 | ORAL_TABLET | Freq: Every day | ORAL | Status: DC
  Administered 2015-07-06: 0.5 via ORAL
  Filled 2015-07-05: qty 1

## 2015-07-05 MED ORDER — SERTRALINE HCL 50 MG PO TABS
50.0000 mg | ORAL_TABLET | Freq: Every day | ORAL | Status: DC
  Administered 2015-07-05 – 2015-07-07 (×3): 50 mg via ORAL
  Filled 2015-07-05 (×3): qty 1

## 2015-07-05 MED ORDER — AMITRIPTYLINE HCL 10 MG PO TABS
10.0000 mg | ORAL_TABLET | Freq: Every day | ORAL | Status: DC
  Administered 2015-07-05 – 2015-07-06 (×2): 10 mg via ORAL
  Filled 2015-07-05 (×3): qty 1

## 2015-07-05 MED ORDER — PROPYLENE GLYCOL 0.6 % OP SOLN: Freq: Every day | OPHTHALMIC | Status: DC | PRN

## 2015-07-05 MED ORDER — APIXABAN 5 MG PO TABS: 5.0000 mg | ORAL_TABLET | Freq: Two times a day (BID) | ORAL | Status: DC

## 2015-07-05 MED ORDER — POLYVINYL ALCOHOL 1.4 % OP SOLN
1.0000 [drp] | OPHTHALMIC | Status: DC | PRN
  Filled 2015-07-05: qty 15

## 2015-07-05 MED ORDER — APIXABAN 2.5 MG PO TABS
2.5000 mg | ORAL_TABLET | Freq: Two times a day (BID) | ORAL | Status: DC
  Administered 2015-07-05 – 2015-07-07 (×4): 2.5 mg via ORAL
  Filled 2015-07-05 (×4): qty 1

## 2015-07-05 MED ORDER — PANTOPRAZOLE SODIUM 40 MG PO TBEC
40.0000 mg | DELAYED_RELEASE_TABLET | Freq: Every day | ORAL | Status: DC
  Administered 2015-07-05 – 2015-07-07 (×3): 40 mg via ORAL
  Filled 2015-07-05 (×3): qty 1

## 2015-07-05 MED ORDER — LEVOTHYROXINE SODIUM 50 MCG PO TABS
75.0000 ug | ORAL_TABLET | Freq: Every day | ORAL | Status: DC
  Administered 2015-07-06 – 2015-07-07 (×2): 75 ug via ORAL
  Filled 2015-07-05 (×5): qty 1

## 2015-07-05 MED ORDER — METOPROLOL TARTRATE 25 MG PO TABS
25.0000 mg | ORAL_TABLET | Freq: Two times a day (BID) | ORAL | Status: DC
  Administered 2015-07-05 – 2015-07-07 (×3): 25 mg via ORAL
  Filled 2015-07-05 (×3): qty 1

## 2015-07-05 MED ORDER — SODIUM CHLORIDE 0.9 % IV BOLUS (SEPSIS)
500.0000 mL | Freq: Once | INTRAVENOUS | Status: AC
  Administered 2015-07-05: 500 mL via INTRAVENOUS

## 2015-07-05 MED ORDER — SODIUM CHLORIDE 0.9 % IJ SOLN
3.0000 mL | Freq: Two times a day (BID) | INTRAMUSCULAR | Status: DC
  Administered 2015-07-06: 3 mL via INTRAVENOUS

## 2015-07-05 NOTE — ED Notes (Signed)
Bed: WA21 Expected date:  Expected time:  Means of arrival:  Comments: EMS- 81 yo generalized weakness

## 2015-07-05 NOTE — ED Notes (Signed)
Pt from home where she has 24/7 home health aid.  Aid called EMS today because during bathing she was showing some Right sided weakness.  This is baseline for her based on prior CVA.  Pt will answer yes/no as is her baseline. BP 110/60 P 50s CBG 140.

## 2015-07-05 NOTE — ED Notes (Signed)
O2 began via [place] at 2L as Pt dropped to 87% on RA

## 2015-07-05 NOTE — ED Notes (Signed)
Pts caregiver and Daughter in law bedside.  They state that Pt O2 sat tends to be 88-94% on RA.  She is not currently using home oxygen.

## 2015-07-05 NOTE — Progress Notes (Signed)
Received fromED 81 YO female A&Ox3 family at bedside

## 2015-07-05 NOTE — ED Notes (Signed)
Per Pts son, Pt is a FULL CODE.

## 2015-07-05 NOTE — ED Provider Notes (Signed)
CSN: [id number]     Arrival date & time 07/05/15  1205 History   First MD Initiated Contact with Patient 07/05/15 1252     Chief Complaint  Patient presents with  . Weakness     (Consider location/radiation/quality/duration/timing/severity/associated sxs/prior Treatment) HPI   Patient is a 81 year old female with history CVA with right-sided hemiparesis, speech and language deficits, proximal A. fib, recent femur fracture and hip replacement in September, followed by 5 weeks of rehabilitation. Family and caretaker stated that she had a persistent cough while in rehabilitation and was treated for UTI.  She is brought to the emergency department via EMS today after her home health aide was was giving her a bath she reportedly slumped over in the bathtub had one episode of vomiting and had increased right-sided weakness with drooling. She states that her last normal was proximally 4 days ago and she had progressive weakness since then.    Past Medical History  Diagnosis Date  . History of cardiac catheterization 2002    Negative  . Vertebral fracture   . Restless legs syndrome with nocturnal myoclonus 05/13/2013     requip controlled   . Abnormal uterine bleeding 1700,1749  . Hypothyroidism   . Arthritis   . Osteoporosis   . History of stomach ulcers   . History of jaundice as a child   . Anemia   . Head pain, chronic     "I have pain in the back of my head for months"  . Dizziness     TAKES MECLIZINE TO TREAT  . Hemiparesis and speech and language deficit as late effects of stroke ([place]) 02/09/2015  . Paroxysmal atrial fibrillation [facility])    Past Surgical History  Procedure Laterality Date  . Back surgery      x 3  . Breast surgery Bilateral     1 lump each breast removed and benign  . Nasal sinus surgery    . Dilation and curettage of uterus  1970  . Catheterization  2003    cardiac cath  . Kyphoplasty  2010  . Cataract extraction Bilateral 2011  . Cholecystectomy  1995     Lap  . Cardiac catheterization  2002    "it was normal" per pt - no record found  . Robotic assisted salpingo oopherectomy Bilateral 11/02/2014    Procedure: ROBOTIC ASSISTED bilateral SALPINGO OOPHORECTOMY;  Surgeon: Everitt Amber, MD;  Location: [facility];  Service: Gynecology;  Laterality: Bilateral;  . Laparoscopy N/A 11/02/2014    Procedure: LAPAROSCOPY DIAGNOSTIC;  Surgeon: Everitt Amber, MD;  Location: [facility];  Service: Gynecology;  Laterality: N/A;  . Abdominal hysterectomy    . Total hip arthroplasty Right 05/23/2015    Procedure: HEMI-ARTHROPLASTY RIGHT HIP ANTERIOR APPROACH;  Surgeon: Leandrew Koyanagi, MD;  Location: [place];  Service: Orthopedics;  Laterality: Right;   Family History  Problem Relation Age of Onset  . Asthma Brother   . Osteoarthritis Son   . Hypertension Father   . Stroke Father   . Cancer Brother     stomach   Social History  Substance Use Topics  . Smoking status: Never Smoker   . Smokeless tobacco: Never Used  . Alcohol Use: No   OB History    Gravida Para Term Preterm AB TAB SAB Ectopic Multiple Living   2 2       1 3      Review of Systems  Constitutional: Positive for activity change. Negative for fever, chills, diaphoresis and appetite  change.  Respiratory: Positive for cough.   Cardiovascular: Negative.   Gastrointestinal: Negative for abdominal pain and constipation.  Genitourinary: Negative.   Musculoskeletal: Negative.   Skin: Negative.   Neurological: Positive for weakness. Negative for syncope.  Psychiatric/Behavioral: Negative.       Allergies  Ibuprofen; Lyrica; Penicillins; Demerol; Dilaudid; Morphine and related; Percocet; Vicodin; Fentanyl; Lidoderm; Other; and Tramadol  Home Medications   Prior to Admission medications   Medication Sig Start Date End Date Taking? Authorizing Provider  acetaminophen (TYLENOL) 500 MG tablet Take 1,000 mg by mouth every 6 (six) hours as needed for mild pain, moderate pain, fever or headache.   Yes Historical  Provider, MD  amitriptyline (ELAVIL) 10 MG tablet Take 10 mg by mouth at bedtime.  04/26/15  Yes Historical Provider, MD  cholecalciferol (VITAMIN D) 1000 UNITS tablet Take 1,000 Units by mouth 2 (two) times daily.    Yes Historical Provider, MD  denosumab (PROLIA) 60 MG/ML SOLN injection Inject 60 mg into the skin every 6 (six) months. Administer in upper arm, thigh, or abdomen   Yes Historical Provider, MD  levothyroxine (SYNTHROID, LEVOTHROID) 75 MCG tablet Take 75 mcg by mouth daily before breakfast.   Yes Historical Provider, MD  metoprolol tartrate (LOPRESSOR) 25 MG tablet Take 1 tablet (25 mg total) by mouth 2 (two) times daily. 03/25/15  Yes Ivan Anchors Love, PA-C  Multiple Vitamin (MULTIVITAMIN WITH MINERALS) TABS tablet Take 0.5 tablets by mouth daily with lunch.    Yes Historical Provider, MD  pantoprazole (PROTONIX) 40 MG tablet Take 40 mg by mouth daily.    Yes Historical Provider, MD  Propylene Glycol (SYSTANE BALANCE OP) Place 1 drop into both eyes daily as needed (dry eyes).    Yes Historical Provider, MD  sertraline (ZOLOFT) 50 MG tablet Take 50 mg by mouth daily.  05/10/15  Yes Historical Provider, MD  apixaban (ELIQUIS) 2.5 MG TABS tablet Take 1 tablet (2.5 mg total) by mouth 2 (two) times daily. 07/07/15   Oswald Hillock, MD  furosemide (LASIX) 20 MG tablet Take 1 tablet (20 mg total) by mouth daily. 07/07/15   Oswald Hillock, MD  methocarbamol (ROBAXIN) 500 MG tablet Take 1 tablet (500 mg total) by mouth every 6 (six) hours as needed for muscle spasms. Patient not taking: Reported on 07/05/2015 05/25/15   Nishant Dhungel, MD  tamsulosin (FLOMAX) 0.4 MG CAPS capsule Take 1 capsule (0.4 mg total) by mouth daily. Patient not taking: Reported on 07/05/2015 05/25/15   Nishant Dhungel, MD   BP 134/59 mmHg  Pulse 61  Temp(Src) 97.5 F (36.4 C) (Oral)  Resp 20  Ht 5\' 1"  (1.549 m)  Wt 95 lb 7.4 oz (43.3 kg)  BMI 18.05 kg/m2  SpO2 100%  LMP 09/03/1996 (Approximate) Physical Exam   Constitutional: She appears well-developed. No distress.  Cachectic, elderly female  HENT:  Head: Normocephalic and atraumatic.  Nose: Nose normal.  Mouth/Throat: Oropharynx is clear and moist. No oropharyngeal exudate.  Eyes: Conjunctivae and EOM are normal. Pupils are equal, round, and reactive to light. Right eye exhibits no discharge. Left eye exhibits no discharge. No scleral icterus.  Neck: Normal range of motion. No JVD present. No tracheal deviation present. No thyromegaly present.  Cardiovascular: Normal rate, regular rhythm, normal heart sounds and intact distal pulses.  Exam reveals no gallop and no friction rub.   No murmur heard. No pitting edema  Pulmonary/Chest: No respiratory distress. She has no wheezes. She has no rales. She exhibits  no tenderness.  Decreased breath sounds throughout with anterior lateral auscultation, no wheeze, no rales, poor inspiratory effort, no tachypnea  Abdominal: Soft. Bowel sounds are normal. She exhibits no distension and no mass. There is no tenderness. There is no rebound and no guarding.  Musculoskeletal: Normal range of motion. She exhibits no edema or tenderness.  Lymphadenopathy:    She has no cervical adenopathy.  Neurological: She is alert. She has normal reflexes. No cranial nerve deficit. She exhibits normal muscle tone. Coordination normal.  Grip strength is symmetrical,   Skin: Skin is warm and dry. No rash noted. She is not diaphoretic. No erythema. No pallor.  Psychiatric: She has a normal mood and affect. Her behavior is normal. Judgment and thought content normal.  Nursing note and vitals reviewed.   ED Course  Procedures (including critical care time) Labs Review Labs Reviewed  PROTIME-INR - Abnormal; Notable for the following:    Prothrombin Time 16.4 (*)    All other components within normal limits  CBC - Abnormal; Notable for the following:    RBC 3.49 (*)    Hemoglobin 10.8 (*)    HCT 34.1 (*)    All other  components within normal limits  COMPREHENSIVE METABOLIC PANEL - Abnormal; Notable for the following:    Chloride 100 (*)    BUN 27 (*)    Calcium 11.2 (*)    Alkaline Phosphatase 151 (*)    GFR calc non Af Amer 58 (*)    All other components within normal limits  COMPREHENSIVE METABOLIC PANEL - Abnormal; Notable for the following:    Potassium 3.2 (*)    BUN 25 (*)    Creatinine, Ser 1.03 (*)    Calcium 10.7 (*)    Total Protein 6.2 (*)    Albumin 3.2 (*)    Alkaline Phosphatase 128 (*)    GFR calc non Af Amer 49 (*)    GFR calc Af Amer 56 (*)    All other components within normal limits  CBC - Abnormal; Notable for the following:    RBC 3.17 (*)    Hemoglobin 9.6 (*)    HCT 30.6 (*)    All other components within normal limits  PTH, INTACT AND CALCIUM - Abnormal; Notable for the following:    Calcium, Total (PTH) 10.6 (*)    All other components within normal limits  BRAIN NATRIURETIC PEPTIDE - Abnormal; Notable for the following:    B Natriuretic Peptide 220.4 (*)    All other components within normal limits  BASIC METABOLIC PANEL - Abnormal; Notable for the following:    CO2 33 (*)    BUN 21 (*)    Calcium 10.6 (*)    GFR calc non Af Amer 56 (*)    All other components within normal limits  I-STAT CHEM 8, ED - Abnormal; Notable for the following:    Chloride 100 (*)    BUN 28 (*)    Calcium, Ion 1.41 (*)    Hemoglobin 11.6 (*)    HCT 34.0 (*)    All other components within normal limits  APTT  DIFFERENTIAL  URINE RAPID DRUG SCREEN, HOSP PERFORMED  URINALYSIS, ROUTINE W REFLEX MICROSCOPIC (NOT AT [facility])  VITAMIN D 25 HYDROXY  PTH-RELATED PEPTIDE  PHOSPHORUS  I-STAT TROPOININ, ED  I-STAT CG4 LACTIC ACID, ED  I-STAT CG4 LACTIC ACID, ED    Imaging Review No results found. I have personally reviewed and evaluated these images and lab results as  part of my medical decision-making.   EKG Interpretation   Date/Time:  Tuesday July 05 2015 14:00:42  EDT Ventricular Rate:  57 PR Interval:  225 QRS Duration: 99 QT Interval:  455 QTC Calculation: 443 R Axis:   -3 Text Interpretation:  Sinus rhythm Prolonged PR interval RSR' in V1 or V2,  right VCD or RVH No significant change since last tracing Confirmed by  Lonia Skinner ([id number]) on 07/05/2015 2:32:24 PM      MDM   Final diagnoses:  Pleural effusion    Elderly female, brought to the ER due to increasing weakness.  Patient reportedly slumped over today while being bathed, had one episode of vomiting. She has had progressive loss of function, 4 days ago she is able to walk on her own. Her son and home health worker stated she has not had any observed dyspnea but that she's had generalized weakness.  She does have a history of CVA with right hemiparesis and speech and language deficits which are reportedly at her baseline. She is alert, responds to verbal questions and commands. Will speak one word answers at a time.  She has good strength throughout.  Patient was worked up for weakness versus stroke, also workup for possible sources of infection Patient's workup was pertinent for anemia which is consistent with her baseline, mildly elevated BUN, elevated alkaline phos, chest x-ray shows progressive bibasilar pleural effusions with atelectasis L>R.    Head CT was negative for acute intracranial pathology, lactic acid was negative, urinalysis was clean.  Patient reportedly has oxygen saturations at home ranging from 80s to low 90s without home O2.  In the ER she was placed on 2L [place]. She was admitted for pleural effusion with new oxygen requirement.     Delsa Grana, PA-C 07/25/15 0444  Harvel Quale, MD 07/30/15 [phone]

## 2015-07-05 NOTE — ED Notes (Signed)
RN attempted IV x2.  

## 2015-07-05 NOTE — ED Notes (Signed)
Patient transported to CT 

## 2015-07-05 NOTE — H&P (Signed)
History and Physical  Linda [name] RCV:[id number] DOB: [birth year] DOA: 07/05/2015  Referring physician: EDP PCP: [name], MD   Chief Complaint: weakness, unresponsiveness  HPI: Linda [name] is a 81 y.o. female   Who was  discharged from SNF to home 4 days ago on Saturday, was brought to [facility] ED due to progressive weakness, per home care RN patient was able to feed herself and able to walk with a walker for about 70-47feets at SNF, she returned back home , was doing fine on Saturday, then started to become weak, today she was not able to feed herself or walk, home health RN noticed she was not responsive for about 92mins, EMS called.  Patient suffered from a stroke in April , has aphasia and right sided weakness. She is not able to talk but communicate by shaking or nodding head, she denies pain, denies sob. No fever, basic labs showed mild hypercalcemia, cxr with bilateral pleural effusion. ua unremarkable. Ct head no acute findings. hospitalist called to admit the patient.  Review of Systems:  Detail per HPI, Review of systems are otherwise negative  Past Medical History  Diagnosis Date  . History of cardiac catheterization 2002    Negative  . Vertebral fracture   . Restless legs syndrome with nocturnal myoclonus 05/13/2013     [place] controlled   . Abnormal uterine bleeding 1025,8527  . Hypothyroidism   . Arthritis   . Osteoporosis   . History of stomach ulcers   . History of jaundice as a child   . Anemia   . Head pain, chronic     "I have pain in the back of my head for months"  . Dizziness     TAKES MECLIZINE TO TREAT  . Hemiparesis and speech and language deficit as late effects of stroke ([place]) 02/09/2015  . Paroxysmal atrial fibrillation [facility])    Past Surgical History  Procedure Laterality Date  . Back surgery      x 3  . Breast surgery Bilateral     1 lump each breast removed and benign  . Nasal sinus surgery    . Dilation and curettage of uterus   1970  . Catheterization  2003    cardiac cath  . Kyphoplasty  2010  . Cataract extraction Bilateral 2011  . Cholecystectomy  1995    Lap  . Cardiac catheterization  2002    "it was normal" per pt - no record found  . Robotic assisted salpingo oopherectomy Bilateral 11/02/2014    Procedure: ROBOTIC ASSISTED bilateral SALPINGO OOPHORECTOMY;  Surgeon: Everitt Amber, MD;  Location: [facility];  Service: Gynecology;  Laterality: Bilateral;  . Laparoscopy N/A 11/02/2014    Procedure: LAPAROSCOPY DIAGNOSTIC;  Surgeon: Everitt Amber, MD;  Location: [facility];  Service: Gynecology;  Laterality: N/A;  . Abdominal hysterectomy    . Total hip arthroplasty Right 05/23/2015    Procedure: HEMI-ARTHROPLASTY RIGHT HIP ANTERIOR APPROACH;  Surgeon: Leandrew Koyanagi, MD;  Location: [place];  Service: Orthopedics;  Laterality: Right;   Social History:  reports that she has never smoked. She has never used smokeless tobacco. She reports that she does not drink alcohol or use illicit drugs. Patient lives at home & is able to participate in activities of daily living with assistance, residual right sided weakness, walks with a walker.  Allergies  Allergen Reactions  . Ibuprofen Anaphylaxis    Passed out and had to be hospitalized  . Lyrica [Pregabalin] Other (See Comments)    "  made me a zombie'  . Penicillins Other (See Comments)    Skin on head was crawling and very very sleepy Has patient had a PCN reaction causing immediate rash, facial/tongue/throat swelling, SOB or lightheadedness with hypotension: No  Has patient had a PCN reaction causing severe rash involving mucus membranes or skin necrosis: No Has patient had a PCN reaction that required hospitalization Unknown Has patient had a PCN reaction occurring within the last 10 years: Unknown If all of the above answers are "NO", then may proceed with Cephalosporin use.   . Demerol [Meperidine Hcl] Nausea And Vomiting  . Dilaudid [Hydromorphone Hcl] Nausea And Vomiting  .  Morphine And Related Nausea And Vomiting  . Percocet [Oxycodone-Acetaminophen] Nausea And Vomiting  . Vicodin [Hydrocodone-Acetaminophen] Nausea And Vomiting  . Fentanyl Other (See Comments)    unknown  . Lidoderm [Lidocaine] Other (See Comments)    unknown  . Other Other (See Comments)    hamburger  . Tramadol Other (See Comments)    unknown    Family History  Problem Relation Age of Onset  . Asthma Brother   . Osteoarthritis Son   . Hypertension Father   . Stroke Father   . Cancer Brother     stomach      Prior to Admission medications   Medication Sig Start Date End Date Taking? Authorizing Provider  acetaminophen (TYLENOL) 500 MG tablet Take 1,000 mg by mouth every 6 (six) hours as needed for mild pain, moderate pain, fever or headache.   Yes Historical Provider, MD  amitriptyline (ELAVIL) 10 MG tablet Take 10 mg by mouth at bedtime.  04/26/15  Yes Historical Provider, MD  apixaban (ELIQUIS) 5 MG TABS tablet Take 5 mg by mouth 2 (two) times daily.   Yes Historical Provider, MD  Calcium Carb-Cholecalciferol (CALCIUM 500 +D) 500-400 MG-UNIT TABS Take 1 tablet by mouth 2 (two) times daily.   Yes Historical Provider, MD  cholecalciferol (VITAMIN D) 1000 UNITS tablet Take 1,000 Units by mouth 2 (two) times daily.    Yes Historical Provider, MD  denosumab (PROLIA) 60 MG/ML SOLN injection Inject 60 mg into the skin every 6 (six) months. Administer in upper arm, thigh, or abdomen   Yes Historical Provider, MD  levothyroxine (SYNTHROID, LEVOTHROID) 75 MCG tablet Take 75 mcg by mouth daily before breakfast.   Yes Historical Provider, MD  metoprolol tartrate (LOPRESSOR) 25 MG tablet Take 1 tablet (25 mg total) by mouth 2 (two) times daily. 03/25/15  Yes Ivan Anchors Love, PA-C  Multiple Vitamin (MULTIVITAMIN WITH MINERALS) TABS tablet Take 0.5 tablets by mouth daily with lunch.    Yes Historical Provider, MD  pantoprazole (PROTONIX) 40 MG tablet Take 40 mg by mouth daily.    Yes Historical  Provider, MD  Propylene Glycol (SYSTANE BALANCE OP) Place 1 drop into both eyes daily as needed (dry eyes).    Yes Historical Provider, MD  sertraline (ZOLOFT) 50 MG tablet Take 50 mg by mouth daily.  05/10/15  Yes Historical Provider, MD  acetaminophen (TYLENOL) 500 MG chewable tablet Chew 1 tablet (500 mg total) by mouth every 6 (six) hours as needed for pain. Patient not taking: Reported on 07/05/2015 05/23/15   Leandrew Koyanagi, MD  methocarbamol (ROBAXIN) 500 MG tablet Take 1 tablet (500 mg total) by mouth every 6 (six) hours as needed for muscle spasms. Patient not taking: Reported on 07/05/2015 05/25/15   Nishant Dhungel, MD  tamsulosin (FLOMAX) 0.4 MG CAPS capsule Take 1 capsule (0.4 mg  total) by mouth daily. Patient not taking: Reported on 07/05/2015 05/25/15   Louellen Molder, MD    Physical Exam: BP 147/65 mmHg  Pulse 64  Temp(Src) 97.6 F (36.4 C) (Oral)  Resp 16  SpO2 96%  LMP 09/03/1996 (Approximate)  General:  Frail, but NAD, very thin Eyes: PERRL ENT: unremarkable Neck: supple, no JVD Cardiovascular: RRR Respiratory: diminished at bases, no rales, no wheezing, no rhonchi Abdomen: soft/ND/ND, positive bowel sounds Skin: no rash Musculoskeletal:  No edema Psychiatric: calm/cooperative Neurologic: baseline aphasia, residual right sided weakness, following command          Labs on Admission:  Basic Metabolic Panel:  Recent Labs Lab 07/05/15 1356 07/05/15 1404  NA 137 138  K 3.6 3.6  CL 100* 100*  CO2 30  --   GLUCOSE 97 96  BUN 27* 28*  CREATININE 0.89 1.00  CALCIUM 11.2*  --    Liver Function Tests:  Recent Labs Lab 07/05/15 1356  AST 35  ALT 26  ALKPHOS 151*  BILITOT 0.7  PROT 6.8  ALBUMIN 3.6   No results for input(s): LIPASE, AMYLASE in the last 168 hours. No results for input(s): AMMONIA in the last 168 hours. CBC:  Recent Labs Lab 07/05/15 1356 07/05/15 1404  WBC 9.2  --   NEUTROABS 6.5  --   HGB 10.8* 11.6*  HCT 34.1* 34.0*  MCV 97.7   --   PLT 298  --    Cardiac Enzymes: No results for input(s): CKTOTAL, CKMB, CKMBINDEX, TROPONINI in the last 168 hours.  BNP (last 3 results) No results for input(s): BNP in the last 8760 hours.  ProBNP (last 3 results) No results for input(s): PROBNP in the last 8760 hours.  CBG: No results for input(s): GLUCAP in the last 168 hours.  Radiological Exams on Admission: Dg Chest 2 View  07/05/2015  CLINICAL DATA:  RIGHT-sided weakness noted today during bath, cough, atrial fibrillation EXAM: CHEST  2 VIEW COMPARISON:  Portable exam 1511 hours compared to 05/21/2015 FINDINGS: Rotated to the LEFT. Upper normal heart size. Mediastinal contours and pulmonary vascularity normal. Atherosclerotic calcification aortic arch. Emphysematous changes with bibasilar effusions and atelectasis greater on LEFT, increased since previous exam. Remaining lungs clear. No pneumothorax. Bones demineralized with prior spinal augmentation procedures at adjacent levels in the mid to inferior thoracic spine associated with focal thoracic kyphosis. IMPRESSION: COPD changes with increased bibasilar effusions and atelectasis, LEFT greater than RIGHT. Electronically Signed   By: Lavonia Dana M.D.   On: 07/05/2015 15:28   Ct Head Wo Contrast  07/05/2015  CLINICAL DATA:  Acute onset of right-sided weakness. History of prior CVAs. EXAM: CT HEAD WITHOUT CONTRAST TECHNIQUE: Contiguous axial images were obtained from the base of the skull through the vertex without intravenous contrast. COMPARISON:  05/20/2015 FINDINGS: Stable age related cerebral atrophy, ventriculomegaly and periventricular white matter disease. Multiple remote bilateral infarcts are noted with encephalomalacia. No new/ acute intracranial findings such as new hemispheric infarction or hemorrhage. The brainstem and cerebellum are grossly normal. IMPRESSION: No acute intracranial findings. Stable age related cerebral atrophy, ventriculomegaly, periventricular white  matter disease and multiple remote bilateral infarcts. Electronically Signed   By: Marijo Sanes M.D.   On: 07/05/2015 15:31   Ct Angio Chest Pe W/cm &/or Wo Cm  07/05/2015  CLINICAL DATA:  81 year old with shortness of breath and hypoxemia. Bilateral pleural effusions identified on earlier chest x-ray. EXAM: CT ANGIOGRAPHY CHEST WITH CONTRAST TECHNIQUE: Multidetector CT imaging of the chest was performed  using the standard protocol during bolus administration of intravenous contrast. Multiplanar CT image reconstructions and MIPs were obtained to evaluate the vascular anatomy. CONTRAST:  Blank ml Omnipaque 350 IV. COMPARISON:  No prior CT. Multiple prior chest x-rays including earlier same date. FINDINGS: Contrast opacification the pulmonary arteries is very good. Respiratory motion blurred images of the bases. Overall, the study is of good diagnostic quality. No filling defects within either main pulmonary artery or their branches in either lung to suggest pulmonary embolism. Heart mildly enlarged. Mild LAD and right coronary artery atherosclerosis. Mild atherosclerosis that mild to moderate atherosclerosis involving the thoracic and upper abdominal aorta without evidence of aneurysm. No pericardial effusion. Large bilateral pleural effusions, left greater than right, with associated consolidation in the lower lobes. Lungs otherwise clear. No evidence of interstitial pulmonary edema. Calcified granuloma in the lateral right lower lobe. Hyperinflation. Central airways patent with moderate bronchial wall thickening. No pathologic mediastinal, hilar or axillary lymphadenopathy. Thyroid gland unremarkable. Visualized upper abdomen unremarkable for the early arterial phase of enhancement. Bone window images demonstrate generalized osseous demineralization, prior compression fractures with augmentation at T8 and T9, degenerative disc disease and spondylosis involving the visualized lower cervical spine and the entire  visualized thoracic spine. Review of the MIP images confirms the above findings. IMPRESSION: 1. No evidence of pulmonary embolism. 2. Large bilateral pleural effusions, left greater than right, with associated passive atelectasis in the lower lobes. 3. Mild cardiomegaly. Hyperinflation consistent with COPD and/or asthma. No acute cardiopulmonary disease otherwise. 4. Calcified granuloma in the right lower lobe. Electronically Signed   By: Evangeline Dakin M.D.   On: 07/05/2015 18:48    EKG: Independently reviewed. Sinus rhythm, no acute st/t changes  Assessment/Plan Present on Admission:  . Hypoxia  Hypoxia, continue oxygen supplement. Bilateral pleural effusion: unclear etiology. CTA no PE, no mass, echo pending,   Syncope? Keep on tele, echo pending  Hypercalcemia: review home meds, she has been on calcium and vitd supplement, d/c this, will chest vitd level, pth, pth related peptide, phos level.  Paf: currently sinus rhythm, continue lopressor, elliquis  H/o CVA with aphasia, right sided weakness at baseline  FTT: may need to return to SNF at discharge.    DVT prophylaxis: on eliquis  Consultants: none  Code Status: full   Family Communication:  Patient and son, daughter in law  Disposition Plan: admit to med tele  Time spent: 8mins  Daquon Greenleaf MD, PhD [facility] Pager [phone] If 7PM-7AM, please contact night-coverage at [email or web address], password [facility]

## 2015-07-06 ENCOUNTER — Inpatient Hospital Stay (HOSPITAL_BASED_OUTPATIENT_CLINIC_OR_DEPARTMENT_OTHER): Payer: Medicare Other

## 2015-07-06 DIAGNOSIS — R0600 Dyspnea, unspecified: Secondary | ICD-10-CM

## 2015-07-06 DIAGNOSIS — R531 Weakness: Secondary | ICD-10-CM | POA: Diagnosis not present

## 2015-07-06 DIAGNOSIS — R0902 Hypoxemia: Secondary | ICD-10-CM

## 2015-07-06 LAB — CBC
HCT: 30.6 % — ABNORMAL LOW (ref 36.0–46.0)
Hemoglobin: 9.6 g/dL — ABNORMAL LOW (ref 12.0–15.0)
MCH: 30.3 pg (ref 26.0–34.0)
MCHC: 31.4 g/dL (ref 30.0–36.0)
MCV: 96.5 fL (ref 78.0–100.0)
PLATELETS: 274 10*3/uL (ref 150–400)
RBC: 3.17 MIL/uL — ABNORMAL LOW (ref 3.87–5.11)
RDW: 14.2 % (ref 11.5–15.5)
WBC: 7.3 10*3/uL (ref 4.0–10.5)

## 2015-07-06 LAB — COMPREHENSIVE METABOLIC PANEL
ALT: 21 U/L (ref 14–54)
AST: 24 U/L (ref 15–41)
Albumin: 3.2 g/dL — ABNORMAL LOW (ref 3.5–5.0)
Alkaline Phosphatase: 128 U/L — ABNORMAL HIGH (ref 38–126)
Anion gap: 5 (ref 5–15)
BILIRUBIN TOTAL: 0.4 mg/dL (ref 0.3–1.2)
BUN: 25 mg/dL — AB (ref 6–20)
CALCIUM: 10.7 mg/dL — AB (ref 8.9–10.3)
CO2: 29 mmol/L (ref 22–32)
CREATININE: 1.03 mg/dL — AB (ref 0.44–1.00)
Chloride: 105 mmol/L (ref 101–111)
GFR calc Af Amer: 56 mL/min — ABNORMAL LOW (ref >60)
GFR, EST NON AFRICAN AMERICAN: 49 mL/min — AB (ref >60)
Glucose, Bld: 94 mg/dL (ref 65–99)
Potassium: 3.2 mmol/L — ABNORMAL LOW (ref 3.5–5.1)
Sodium: 139 mmol/L (ref 135–145)
TOTAL PROTEIN: 6.2 g/dL — AB (ref 6.5–8.1)

## 2015-07-06 LAB — BRAIN NATRIURETIC PEPTIDE: B NATRIURETIC PEPTIDE 5: 220.4 pg/mL — AB (ref 0.0–100.0)

## 2015-07-06 LAB — PHOSPHORUS: PHOSPHORUS: 3.9 mg/dL (ref 2.5–4.6)

## 2015-07-06 MED ORDER — FUROSEMIDE 20 MG PO TABS
20.0000 mg | ORAL_TABLET | Freq: Every day | ORAL | Status: DC
  Administered 2015-07-06 – 2015-07-07 (×2): 20 mg via ORAL
  Filled 2015-07-06 (×2): qty 1

## 2015-07-06 MED ORDER — POTASSIUM CHLORIDE 10 MEQ/100ML IV SOLN
10.0000 meq | INTRAVENOUS | Status: AC
  Administered 2015-07-06 (×3): 10 meq via INTRAVENOUS
  Filled 2015-07-06 (×2): qty 100

## 2015-07-06 NOTE — Progress Notes (Signed)
  Echocardiogram 2D Echocardiogram has been performed.  Diamond Nickel 07/06/2015, 11:41 AM

## 2015-07-06 NOTE — Care Management Obs Status (Signed)
[place] NOTIFICATION   Patient Details  Name: GLENICE CICCONE MRN: [id number] Date of Birth: [birth year]   Medicare Observation Status Notification Given:       Purcell Mouton, RN 07/06/2015, 12:08 PM

## 2015-07-06 NOTE — Progress Notes (Signed)
Pt inct of urine and stool, one stool noted with bright blood mixed in stool. Will cont to f/u. SRP, RN

## 2015-07-06 NOTE — Plan of Care (Signed)
Problem: Education: Goal: Knowledge of [facility] Education information/materials will improve Outcome: Completed/Met Date Met:  07/06/15 Educated caregiver at bedside

## 2015-07-06 NOTE — Progress Notes (Signed)
[facility] PROGRESS NOTE  Linda [name]:[id number] DOB: [birth year] DOA: 07/05/2015 PCP: [name], MD  Assessment/Plan: 1. Hypoxia- likely from underlying COPD. Continue oxygen. 2. ? Diastolic CHF- start low dose lasix 20 mg po daily. 3. Hypokalemia- replace potassium and check bmp in am. 4. Paroxysmal atrial fibrillation- continue lopressor, eliquis. 5. Hypercalcemia- hold calcium supplements, check cmp in am.  Code Status: Full code Family Communication: Discussed with caregiver at bedside Disposition Plan: home when stable   Consultants:  None   Procedures:     None   Antibiotics:  None   HPI/Subjective: 81 y.o. female who was discharged from SNF to home 4 days ago on Saturday, was brought to [facility] ED due to progressive weakness, per home care RN patient was able to feed herself and able to walk with a walker for about 70-32feets at SNF, she returned back home , was doing fine on Saturday, then started to become weak, today she was not able to feed herself or walk, home health RN noticed she was not responsive for about 89mins, EMS called.  Patient suffered from a stroke in April , has aphasia and right sided weakness. She is not able to talk but communicate by shaking or nodding head, she denies pain, denies sob. No fever, basic labs showed mild hypercalcemia, cxr with bilateral pleural effusion. ua unremarkable. Ct head no acute findings. hospitalist called to admit the patient. This morning patient denies shortness of breath.  Objective: Filed Vitals:   07/06/15 1354  BP: 127/52  Pulse:   Temp:   Resp:     Intake/Output Summary (Last 24 hours) at 07/06/15 1747 Last data filed at 07/06/15 1248  Gross per 24 hour  Intake    303 ml  Output      0 ml  Net    303 ml   Filed Weights   07/05/15 1903  Weight: 43.3 kg (95 lb 7.4 oz)    Exam:   General:  Appear in no acute distress  Cardiovascular: S1S2 RRR  Respiratory: Clear  bilaterally  Abdomen: soft, nontender, no organomegaly  Musculoskeletal: No cyanosis/clubbing/edema of the lower extremities  Data Reviewed: Basic Metabolic Panel:  Recent Labs Lab 07/05/15 1356 07/05/15 1404 07/06/15 0442  NA 137 138 139  K 3.6 3.6 3.2*  CL 100* 100* 105  CO2 30  --  29  GLUCOSE 97 96 94  BUN 27* 28* 25*  CREATININE 0.89 1.00 1.03*  CALCIUM 11.2*  --  10.7*  PHOS  --   --  3.9   Liver Function Tests:  Recent Labs Lab 07/05/15 1356 07/06/15 0442  AST 35 24  ALT 26 21  ALKPHOS 151* 128*  BILITOT 0.7 0.4  PROT 6.8 6.2*  ALBUMIN 3.6 3.2*   No results for input(s): LIPASE, AMYLASE in the last 168 hours. No results for input(s): AMMONIA in the last 168 hours. CBC:  Recent Labs Lab 07/05/15 1356 07/05/15 1404 07/06/15 0442  WBC 9.2  --  7.3  NEUTROABS 6.5  --   --   HGB 10.8* 11.6* 9.6*  HCT 34.1* 34.0* 30.6*  MCV 97.7  --  96.5  PLT 298  --  274   Cardiac Enzymes: No results for input(s): CKTOTAL, CKMB, CKMBINDEX, TROPONINI in the last 168 hours. BNP (last 3 results)  Recent Labs  07/06/15 0442  BNP 220.4*    ProBNP (last 3 results) No results for input(s): PROBNP in the last 8760 hours.  CBG: No results for  input(s): GLUCAP in the last 168 hours.  No results found for this or any previous visit (from the past 240 hour(s)).   Studies: Dg Chest 2 View  07/05/2015  CLINICAL DATA:  RIGHT-sided weakness noted today during bath, cough, atrial fibrillation EXAM: CHEST  2 VIEW COMPARISON:  Portable exam 1511 hours compared to 05/21/2015 FINDINGS: Rotated to the LEFT. Upper normal heart size. Mediastinal contours and pulmonary vascularity normal. Atherosclerotic calcification aortic arch. Emphysematous changes with bibasilar effusions and atelectasis greater on LEFT, increased since previous exam. Remaining lungs clear. No pneumothorax. Bones demineralized with prior spinal augmentation procedures at adjacent levels in the mid to inferior  thoracic spine associated with focal thoracic kyphosis. IMPRESSION: COPD changes with increased bibasilar effusions and atelectasis, LEFT greater than RIGHT. Electronically Signed   By: Lavonia Dana M.D.   On: 07/05/2015 15:28   Ct Head Wo Contrast  07/05/2015  CLINICAL DATA:  Acute onset of right-sided weakness. History of prior CVAs. EXAM: CT HEAD WITHOUT CONTRAST TECHNIQUE: Contiguous axial images were obtained from the base of the skull through the vertex without intravenous contrast. COMPARISON:  05/20/2015 FINDINGS: Stable age related cerebral atrophy, ventriculomegaly and periventricular white matter disease. Multiple remote bilateral infarcts are noted with encephalomalacia. No new/ acute intracranial findings such as new hemispheric infarction or hemorrhage. The brainstem and cerebellum are grossly normal. IMPRESSION: No acute intracranial findings. Stable age related cerebral atrophy, ventriculomegaly, periventricular white matter disease and multiple remote bilateral infarcts. Electronically Signed   By: Marijo Sanes M.D.   On: 07/05/2015 15:31   Ct Angio Chest Pe W/cm &/or Wo Cm  07/05/2015  CLINICAL DATA:  81 year old with shortness of breath and hypoxemia. Bilateral pleural effusions identified on earlier chest x-ray. EXAM: CT ANGIOGRAPHY CHEST WITH CONTRAST TECHNIQUE: Multidetector CT imaging of the chest was performed using the standard protocol during bolus administration of intravenous contrast. Multiplanar CT image reconstructions and MIPs were obtained to evaluate the vascular anatomy. CONTRAST:  Blank ml Omnipaque 350 IV. COMPARISON:  No prior CT. Multiple prior chest x-rays including earlier same date. FINDINGS: Contrast opacification the pulmonary arteries is very good. Respiratory motion blurred images of the bases. Overall, the study is of good diagnostic quality. No filling defects within either main pulmonary artery or their branches in either lung to suggest pulmonary embolism.  Heart mildly enlarged. Mild LAD and right coronary artery atherosclerosis. Mild atherosclerosis that mild to moderate atherosclerosis involving the thoracic and upper abdominal aorta without evidence of aneurysm. No pericardial effusion. Large bilateral pleural effusions, left greater than right, with associated consolidation in the lower lobes. Lungs otherwise clear. No evidence of interstitial pulmonary edema. Calcified granuloma in the lateral right lower lobe. Hyperinflation. Central airways patent with moderate bronchial wall thickening. No pathologic mediastinal, hilar or axillary lymphadenopathy. Thyroid gland unremarkable. Visualized upper abdomen unremarkable for the early arterial phase of enhancement. Bone window images demonstrate generalized osseous demineralization, prior compression fractures with augmentation at T8 and T9, degenerative disc disease and spondylosis involving the visualized lower cervical spine and the entire visualized thoracic spine. Review of the MIP images confirms the above findings. IMPRESSION: 1. No evidence of pulmonary embolism. 2. Large bilateral pleural effusions, left greater than right, with associated passive atelectasis in the lower lobes. 3. Mild cardiomegaly. Hyperinflation consistent with COPD and/or asthma. No acute cardiopulmonary disease otherwise. 4. Calcified granuloma in the right lower lobe. Electronically Signed   By: Evangeline Dakin M.D.   On: 07/05/2015 18:48    Scheduled Meds: . amitriptyline  10 mg Oral QHS  . apixaban  2.5 mg Oral BID  . furosemide  20 mg Oral Daily  . levothyroxine  75 mcg Oral QAC breakfast  . metoprolol tartrate  25 mg Oral BID  . multivitamin with minerals  0.5 tablet Oral Q lunch  . pantoprazole  40 mg Oral Daily  . sertraline  50 mg Oral Daily  . sodium chloride  3 mL Intravenous Q12H  . sodium chloride  3 mL Intravenous Q12H   Continuous Infusions:   Active Problems:   Weakness   Hypoxia    Time spent: 25  min    [facility]  [facility] Pager [phone]*. If 7PM-7AM, please contact night-coverage at [email or web address], password [facility] 07/06/2015, 5:47 PM  LOS: 1 day

## 2015-07-07 DIAGNOSIS — R0902 Hypoxemia: Secondary | ICD-10-CM | POA: Diagnosis not present

## 2015-07-07 DIAGNOSIS — R531 Weakness: Secondary | ICD-10-CM | POA: Diagnosis not present

## 2015-07-07 LAB — BASIC METABOLIC PANEL
Anion gap: 5 (ref 5–15)
BUN: 21 mg/dL — ABNORMAL HIGH (ref 6–20)
CHLORIDE: 101 mmol/L (ref 101–111)
CO2: 33 mmol/L — ABNORMAL HIGH (ref 22–32)
Calcium: 10.6 mg/dL — ABNORMAL HIGH (ref 8.9–10.3)
Creatinine, Ser: 0.92 mg/dL (ref 0.44–1.00)
GFR, EST NON AFRICAN AMERICAN: 56 mL/min — AB (ref >60)
Glucose, Bld: 95 mg/dL (ref 65–99)
POTASSIUM: 3.6 mmol/L (ref 3.5–5.1)
SODIUM: 139 mmol/L (ref 135–145)

## 2015-07-07 LAB — VITAMIN D 25 HYDROXY (VIT D DEFICIENCY, FRACTURES): Vit D, 25-Hydroxy: 66.8 ng/mL (ref 30.0–100.0)

## 2015-07-07 LAB — PTH, INTACT AND CALCIUM
Calcium, Total (PTH): 10.6 mg/dL — ABNORMAL HIGH (ref 8.7–10.3)
PTH: 18 pg/mL (ref 15–65)

## 2015-07-07 MED ORDER — APIXABAN 2.5 MG PO TABS: 2.5000 mg | ORAL_TABLET | Freq: Two times a day (BID) | ORAL | Status: DC

## 2015-07-07 MED ORDER — ENSURE ENLIVE PO LIQD: 237.0000 mL | Freq: Two times a day (BID) | ORAL | Status: DC

## 2015-07-07 MED ORDER — FUROSEMIDE 20 MG PO TABS: 20.0000 mg | ORAL_TABLET | Freq: Every day | ORAL | Status: AC

## 2015-07-07 NOTE — Progress Notes (Signed)
Patient's O2 Saturation dropped to 73 on RA while patient was resting in bed. 2 L O2 placed via [place] and sat's returned to 96-100. Caregiver and patient education on pursed lip breathing with teachback/demonstration.  Barbee Shropshire. Brigitte Pulse, RN

## 2015-07-07 NOTE — Care Management Note (Signed)
Case Management Note  Patient Details  Name: CONSTANCIA GEETING MRN: [id number] Date of Birth: 1929/01/03  Subjective/Objective:           81 yo admitted with Weakness         Action/Plan: To home with [facility] health  Expected Discharge Date:   (unknown)               Expected Discharge Plan:  [place]  In-House Referral:     Discharge planning Services  CM Consult  Post Acute Care Choice:  Resumption of Svcs/PTA Provider, Durable Medical Equipment (Active w/[facility] for [facility]) Choice offered to:     DME Arranged:  Oxygen, Hospital bed DME Agency:  [place]:  PT, OT, RN, Nurse's Aide [facility] Agency:  [place]  Status of Service:  In process, will continue to follow  Medicare Important Message Given:    Date Medicare IM Given:    Medicare IM give by:    Date Additional Medicare IM Given:    Additional Medicare Important Message give by:     If discussed at [place] of Stay Meetings, dates discussed:    Additional Comments: Pt is currently active with [facility] for [facility] services. It was confirmed with [facility] rep they would continue to follow at DC. Orders for 02 and hospital bed received. [facility] rep informed of orders Lynnell Catalan, RN 07/07/2015, 12:50 PM

## 2015-07-07 NOTE — Progress Notes (Addendum)
SATURATION QUALIFICATIONS: (This note is used to comply with regulatory documentation for home oxygen)  Patient Saturations on Room Air at Rest = 73%  Patient Saturations on Room Air while Ambulating =   Patient Saturations on 2  Liters of oxygen while at rest =   Please briefly explain why patient needs home oxygen: To maintain 02 saturations

## 2015-07-07 NOTE — Progress Notes (Signed)
Initial Nutrition Assessment  DOCUMENTATION CODES:   Severe malnutrition in context of chronic illness, Underweight  INTERVENTION:  - Will order Ensure Enlive po BID, each supplement provides 350 kcal and 20 grams of protein - Encouragement with intakes of supplements and fluid - RD will continue to monitor for needs  NUTRITION DIAGNOSIS:   Malnutrition related to chronic illness as evidenced by severe depletion of muscle mass, severe depletion of body fat.  GOAL:   Patient will meet greater than or equal to 90% of their needs  MONITOR:   PO intake, Supplement acceptance, Weight trends, Labs, Skin, I & O's  REASON FOR ASSESSMENT:   Other (Comment) (underweight BMI)   ASSESSMENT:   81 year-old females who wasdischarged from SNF to home 4 days ago on Saturday, was brought to [facility] ED due to progressive weakness, per home care RN patient was able to feed herself and able to walk with a walker for about 70-51feets at SNF, she returned back home , was doing fine on Saturday, then started to become weak, today she was not able to feed herself or walk, home health RN noticed she was not responsive for about 76mins, EMS called.   Pt seen for underweight BMI. Per chart review, pt ate 100% of breakfast this AM and 50% of lunch yesterday (11/2). Pt sleeping in chair at time of RD visit. Spoke with caregiver who is at bedside and provided all information.   PTA pt had a very good appetite and ate "100% and then some." Caregiver states that she began working with pt in August 2016 at which time pt weighed 82 lbs. She helped pt with encouragement with meals and fluids and states pt gained up to 104.2 lbs but subsequently lost 4 lbs while she was at a rehab facility recently. Pt is able to self-feed with finger foods but is unable to use a fork or spoon due to R-sided weakness resulting from stroke in April 2016; caregiver feeds pt non-fingerfood foods. At home pt was provided with 2900 mL fluid/day  due to ongoing diarrhea/excessive stool output. She was drinking protein shakes but had also had Ensure in the past which she did well with and would be willing to consume.   Per chart review, pt has lost 5 lbs (5% body weight) in the past 1 month which is significant for time frame. Severe muscle and fat wasting noted to upper body; lower body not assessed at this time. Needs increased due to hx of stroke.  Likely to meet needs with addition of supplement. Medications reviewed. Labs reviewed; BUN elevated, Ca: 10.6 mg/dL, GFR: 56.   Diet Order:  Diet Heart Room service appropriate?: Yes; Fluid consistency:: Thin  Skin:  Reviewed, no issues  Last BM:  11/2  Height:   Ht Readings from Last 1 Encounters:  07/05/15 5\' 1"  (1.549 m)    Weight:   Wt Readings from Last 1 Encounters:  07/05/15 95 lb 7.4 oz (43.3 kg)    Ideal Body Weight:  47.73 kg (kg)  BMI:  Body mass index is 18.05 kg/(m^2).  Estimated Nutritional Needs:   Kcal:  1200-1400  Protein:  50-60 grams  Fluid:  2-2.5 L/day  EDUCATION NEEDS:   No education needs identified at this time     Jarome Matin, RD, LDN Inpatient Clinical Dietitian Pager # [phone] After hours/weekend pager # [phone]

## 2015-07-07 NOTE — Progress Notes (Signed)
Patient's Son here to pick the patient up, says home O2 has been delivered. D/C teaching done and paperwork given.

## 2015-07-07 NOTE — Progress Notes (Signed)
caregiver states oxygen has not been delivered to house. Patients son at home waiting for delivery. Patient will be discharged when oxygen arrives at her house [place]. Brigitte Pulse, RN

## 2015-07-07 NOTE — Progress Notes (Signed)
Patient has D/C orders, just waiting on home O2 to be delivered. Denies any needs at this time. Caregiver at bedside. Will continue to monitor.

## 2015-07-07 NOTE — Discharge Summary (Signed)
Physician Discharge Summary  Linda [name] GYJ:[id number] DOB: 1928-09-12 DOA: 07/05/2015  PCP: [name], MD  Admit date: 07/05/2015 Discharge date: 07/07/2015  Time spent: 25* minutes  Recommendations for Outpatient Follow-up:  1. Follow up PCP in 2 weeks to check BMP  Discharge Diagnoses:  Active Problems:   Weakness   Hypoxia   Discharge Condition: Stable  Diet recommendation: Low salt diet  Filed Weights   07/05/15 1903  Weight: 43.3 kg (95 lb 7.4 oz)    History of present illness:  81 y.o. female who was discharged from SNF to home 4 days ago on Saturday, was brought to [facility] ED due to progressive weakness, per home care RN patient was able to feed herself and able to walk with a walker for about 70-67feets at SNF, she returned back home , was doing fine on Saturday, then started to become weak, today she was not able to feed herself or walk, home health RN noticed she was not responsive for about 50mins, EMS called.  Patient suffered from a stroke in April , has aphasia and right sided weakness. She is not able to talk but communicate by shaking or nodding head, she denies pain, denies sob. No fever, basic labs showed mild hypercalcemia, cxr with bilateral pleural effusion. ua unremarkable. Ct head no acute findings. hospitalist called to admit the patient.  Hospital Course:  1. Hypoxia- resolved, likely from underlying COPD. Continue oxygen at 2 liter/min. 2. ? Diastolic CHF- started low dose lasix 20 mg po daily. 3. Hypokalemia- replaced potassium and today the potassium is 3.6 4. Paroxysmal atrial fibrillation- continue lopressor, eliquis.will change the dose of Eliquis to 2.5 mg po bid as per her low body weight and advanced age as per pharmacy recommendation. 5. Hypercalcemia- repeat calcium is 10.6, will discontinue the calcium supplements. Continue vitamin D.   Procedures:  None  Consultations:  None  Discharge Exam: Filed Vitals:   07/07/15  0912  BP:   Pulse: 71  Temp:   Resp:     General: Appears in no acute distress Cardiovascular: S1S2 RRR Respiratory: Clear bilaterally  Discharge Instructions   Discharge Instructions    Diet - low sodium heart healthy    Complete by:  As directed      Increase activity slowly    Complete by:  As directed           Current Discharge Medication List    START taking these medications   Details  furosemide (LASIX) 20 MG tablet Take 1 tablet (20 mg total) by mouth daily. Qty: 30 tablet, Refills: 2      CONTINUE these medications which have CHANGED   Details  apixaban (ELIQUIS) 2.5 MG TABS tablet Take 1 tablet (2.5 mg total) by mouth 2 (two) times daily. Qty: 60 tablet, Refills: 2      CONTINUE these medications which have NOT CHANGED   Details  acetaminophen (TYLENOL) 500 MG tablet Take 1,000 mg by mouth every 6 (six) hours as needed for mild pain, moderate pain, fever or headache.    amitriptyline (ELAVIL) 10 MG tablet Take 10 mg by mouth at bedtime.  Refills: 6    cholecalciferol (VITAMIN D) 1000 UNITS tablet Take 1,000 Units by mouth 2 (two) times daily.     denosumab (PROLIA) 60 MG/ML SOLN injection Inject 60 mg into the skin every 6 (six) months. Administer in upper arm, thigh, or abdomen    levothyroxine (SYNTHROID, LEVOTHROID) 75 MCG tablet Take 75 mcg by mouth  daily before breakfast.    metoprolol tartrate (LOPRESSOR) 25 MG tablet Take 1 tablet (25 mg total) by mouth 2 (two) times daily. Qty: 30 tablet, Refills: 1    Multiple Vitamin (MULTIVITAMIN WITH MINERALS) TABS tablet Take 0.5 tablets by mouth daily with lunch.     pantoprazole (PROTONIX) 40 MG tablet Take 40 mg by mouth daily.     Propylene Glycol (SYSTANE BALANCE OP) Place 1 drop into both eyes daily as needed (dry eyes).     sertraline (ZOLOFT) 50 MG tablet Take 50 mg by mouth daily.  Refills: 3    methocarbamol (ROBAXIN) 500 MG tablet Take 1 tablet (500 mg total) by mouth every 6 (six) hours  as needed for muscle spasms. Qty: 30 tablet, Refills: 0    tamsulosin (FLOMAX) 0.4 MG CAPS capsule Take 1 capsule (0.4 mg total) by mouth daily. Qty: 30 capsule, Refills: 0      STOP taking these medications     Calcium Carb-Cholecalciferol (CALCIUM 500 +D) 500-400 MG-UNIT TABS      acetaminophen (TYLENOL) 500 MG chewable tablet        Allergies  Allergen Reactions  . Ibuprofen Anaphylaxis    Passed out and had to be hospitalized  . Lyrica [Pregabalin] Other (See Comments)    "made me a zombie'  . Penicillins Other (See Comments)    Skin on head was crawling and very very sleepy Has patient had a PCN reaction causing immediate rash, facial/tongue/throat swelling, SOB or lightheadedness with hypotension: No  Has patient had a PCN reaction causing severe rash involving mucus membranes or skin necrosis: No Has patient had a PCN reaction that required hospitalization Unknown Has patient had a PCN reaction occurring within the last 10 years: Unknown If all of the above answers are "NO", then may proceed with Cephalosporin use.   . Demerol [Meperidine Hcl] Nausea And Vomiting  . Dilaudid [Hydromorphone Hcl] Nausea And Vomiting  . Morphine And Related Nausea And Vomiting  . Percocet [Oxycodone-Acetaminophen] Nausea And Vomiting  . Vicodin [Hydrocodone-Acetaminophen] Nausea And Vomiting  . Fentanyl Other (See Comments)    unknown  . Lidoderm [Lidocaine] Other (See Comments)    unknown  . Other Other (See Comments)    hamburger  . Tramadol Other (See Comments)    unknown      The results of significant diagnostics from this hospitalization (including imaging, microbiology, ancillary and laboratory) are listed below for reference.    Significant Diagnostic Studies: Dg Chest 2 View  07/05/2015  CLINICAL DATA:  RIGHT-sided weakness noted today during bath, cough, atrial fibrillation EXAM: CHEST  2 VIEW COMPARISON:  Portable exam 1511 hours compared to 05/21/2015 FINDINGS:  Rotated to the LEFT. Upper normal heart size. Mediastinal contours and pulmonary vascularity normal. Atherosclerotic calcification aortic arch. Emphysematous changes with bibasilar effusions and atelectasis greater on LEFT, increased since previous exam. Remaining lungs clear. No pneumothorax. Bones demineralized with prior spinal augmentation procedures at adjacent levels in the mid to inferior thoracic spine associated with focal thoracic kyphosis. IMPRESSION: COPD changes with increased bibasilar effusions and atelectasis, LEFT greater than RIGHT. Electronically Signed   By: Lavonia Dana M.D.   On: 07/05/2015 15:28   Ct Head Wo Contrast  07/05/2015  CLINICAL DATA:  Acute onset of right-sided weakness. History of prior CVAs. EXAM: CT HEAD WITHOUT CONTRAST TECHNIQUE: Contiguous axial images were obtained from the base of the skull through the vertex without intravenous contrast. COMPARISON:  05/20/2015 FINDINGS: Stable age related cerebral atrophy, ventriculomegaly and  periventricular white matter disease. Multiple remote bilateral infarcts are noted with encephalomalacia. No new/ acute intracranial findings such as new hemispheric infarction or hemorrhage. The brainstem and cerebellum are grossly normal. IMPRESSION: No acute intracranial findings. Stable age related cerebral atrophy, ventriculomegaly, periventricular white matter disease and multiple remote bilateral infarcts. Electronically Signed   By: Marijo Sanes M.D.   On: 07/05/2015 15:31   Ct Angio Chest Pe W/cm &/or Wo Cm  07/05/2015  CLINICAL DATA:  81 year old with shortness of breath and hypoxemia. Bilateral pleural effusions identified on earlier chest x-ray. EXAM: CT ANGIOGRAPHY CHEST WITH CONTRAST TECHNIQUE: Multidetector CT imaging of the chest was performed using the standard protocol during bolus administration of intravenous contrast. Multiplanar CT image reconstructions and MIPs were obtained to evaluate the vascular anatomy. CONTRAST:   Blank ml Omnipaque 350 IV. COMPARISON:  No prior CT. Multiple prior chest x-rays including earlier same date. FINDINGS: Contrast opacification the pulmonary arteries is very good. Respiratory motion blurred images of the bases. Overall, the study is of good diagnostic quality. No filling defects within either main pulmonary artery or their branches in either lung to suggest pulmonary embolism. Heart mildly enlarged. Mild LAD and right coronary artery atherosclerosis. Mild atherosclerosis that mild to moderate atherosclerosis involving the thoracic and upper abdominal aorta without evidence of aneurysm. No pericardial effusion. Large bilateral pleural effusions, left greater than right, with associated consolidation in the lower lobes. Lungs otherwise clear. No evidence of interstitial pulmonary edema. Calcified granuloma in the lateral right lower lobe. Hyperinflation. Central airways patent with moderate bronchial wall thickening. No pathologic mediastinal, hilar or axillary lymphadenopathy. Thyroid gland unremarkable. Visualized upper abdomen unremarkable for the early arterial phase of enhancement. Bone window images demonstrate generalized osseous demineralization, prior compression fractures with augmentation at T8 and T9, degenerative disc disease and spondylosis involving the visualized lower cervical spine and the entire visualized thoracic spine. Review of the MIP images confirms the above findings. IMPRESSION: 1. No evidence of pulmonary embolism. 2. Large bilateral pleural effusions, left greater than right, with associated passive atelectasis in the lower lobes. 3. Mild cardiomegaly. Hyperinflation consistent with COPD and/or asthma. No acute cardiopulmonary disease otherwise. 4. Calcified granuloma in the right lower lobe. Electronically Signed   By: Evangeline Dakin M.D.   On: 07/05/2015 18:48    Microbiology: No results found for this or any previous visit (from the past 240 hour(s)).    Labs: Basic Metabolic Panel:  Recent Labs Lab 07/05/15 1356 07/05/15 1404 07/06/15 0442 07/07/15 0425  NA 137 138 139 139  K 3.6 3.6 3.2* 3.6  CL 100* 100* 105 101  CO2 30  --  29 33*  GLUCOSE 97 96 94 95  BUN 27* 28* 25* 21*  CREATININE 0.89 1.00 1.03* 0.92  CALCIUM 11.2*  --  10.7*  10.6* 10.6*  PHOS  --   --  3.9  --    Liver Function Tests:  Recent Labs Lab 07/05/15 1356 07/06/15 0442  AST 35 24  ALT 26 21  ALKPHOS 151* 128*  BILITOT 0.7 0.4  PROT 6.8 6.2*  ALBUMIN 3.6 3.2*   No results for input(s): LIPASE, AMYLASE in the last 168 hours. No results for input(s): AMMONIA in the last 168 hours. CBC:  Recent Labs Lab 07/05/15 1356 07/05/15 1404 07/06/15 0442  WBC 9.2  --  7.3  NEUTROABS 6.5  --   --   HGB 10.8* 11.6* 9.6*  HCT 34.1* 34.0* 30.6*  MCV 97.7  --  96.5  PLT 298  --  274   Cardiac Enzymes: No results for input(s): CKTOTAL, CKMB, CKMBINDEX, TROPONINI in the last 168 hours. BNP: BNP (last 3 results)  Recent Labs  07/06/15 0442  BNP 220.4*    ProBNP (last 3 results) No results for input(s): PROBNP in the last 8760 hours.  CBG: No results for input(s): GLUCAP in the last 168 hours.     SignedEleonore Chiquito S  [facility] 07/07/2015, 2:00 PM

## 2015-07-07 NOTE — Evaluation (Signed)
Physical Therapy Evaluation Patient Details Name: Linda [name] MRN: [id number] DOB: 05-07-1929 Today's Date: 07/07/2015   History of Present Illness  81 yo female with hx of stroke (April'16) with R hemiparesis, recent (September'16) R femoral neck fx, osteoporosis, hypothyroid, and PAF. She was d/c from SNF 4 days prior and was brought to the ED due to sudden weakness and decreased Oxygen levels.      Clinical Impression  Pt admitted with above diagnosis. Pt currently with functional limitations due to the deficits listed below (see PT Problem List). Pt will benefit from skilled PT to increase their independence and safety with mobility to allow discharge to the venue listed below. Pt with plans to d/c home with 24-hr assist from personal care assistance and her son at home. Would benefit from HHPT for continuous rehab.        Follow Up Recommendations Home health PT;SNF;Supervision/Assistance - 24 hour (depending on progress)    Equipment Recommendations       Recommendations for Other Services       Precautions / Restrictions Precautions Precautions: Fall Precaution Comments: monitor oxygen sat Restrictions Weight Bearing Restrictions: No      Mobility  Bed Mobility Overal bed mobility: Needs Assistance Bed Mobility: Supine to Sit     Supine to sit: Mod assist     General bed mobility comments: assist with trunk forward, multiple attempts by pt with LE transfer, cues for hand placement and pushing off  Transfers Overall transfer level: Needs assistance Equipment used: Rolling walker (2 wheeled) Transfers: Sit to/from Stand Sit to Stand: Mod assist         General transfer comment: steady assist with rise and descent, assist with maintaining midline during standing and sitting, cues for hand placemnt and RW use. Pt transferred to [facility] and a few steps to recliner.      Ambulation/Gait Ambulation/Gait assistance: Mod assist Ambulation Distance (Feet): 8  Feet Assistive device: Rolling walker (2 wheeled) Gait Pattern/deviations: Step-through pattern;Antalgic;Leaning posteriorly;Trunk flexed;Decreased stride length Gait velocity: decreased    General Gait Details: Assist with trunk maintaining erect and maintaining midline orientation, maintained  supplemental oxygen at 2L O2  Stairs            Wheelchair Mobility    Modified Rankin (Stroke Patients Only)       Balance Overall balance assessment: Needs assistance Sitting-balance support: Bilateral upper extremity supported Sitting balance-Leahy Scale: Poor Sitting balance - Comments: difficulties maintaining sitting  balance, pt continuously leaning to the left  Postural control: Left lateral lean Standing balance support: Bilateral upper extremity supported Standing balance-Leahy Scale: Poor Standing balance comment: Pt requires support of RW and mod assist from therapist to maintain standing balance                              Pertinent Vitals/Pain Pain Assessment: No/denies pain    Home Living Family/patient expects to be discharged to:: Private residence Living Arrangements: Children (son) Available Help at Discharge: Personal care attendant;Available 24 hours/day;Family Type of Home: House Home Access: Stairs to enter Entrance Stairs-Rails: Chemical engineer of Steps: 7   Home Equipment: Cane - single point;Walker - 2 wheels      Prior Function Level of Independence: Needs assistance   Gait / Transfers Assistance Needed: assist with RW, backward walking/transfer assist by personal care attendant     ADL's / Homemaking Assistance Needed: assist by personal care attendant  Hand Dominance        Extremity/Trunk Assessment   Upper Extremity Assessment: Generalized weakness;RUE deficits/detail RUE Deficits / Details: deficits d/t previous stroke          Lower Extremity Assessment: Generalized weakness;RLE  deficits/detail RLE Deficits / Details: deficits due to previous stroke     Cervical / Trunk Assessment: Kyphotic  Communication   Communication: Expressive difficulties  Cognition Arousal/Alertness: Awake/alert Behavior During Therapy: Flat affect Overall Cognitive Status: Difficult to assess                      General Comments      Exercises        Assessment/Plan    PT Assessment Patient needs continued PT services  PT Diagnosis Difficulty walking;Abnormality of gait;Generalized weakness   PT Problem List Decreased strength;Decreased activity tolerance;Cardiopulmonary status limiting activity;Decreased balance;Decreased mobility;Decreased knowledge of precautions;Decreased safety awareness;Decreased knowledge of use of DME  PT Treatment Interventions DME instruction;Gait training;Functional mobility training;Therapeutic activities;Therapeutic exercise;Balance training;Patient/family education   PT Goals (Current goals can be found in the Care Plan section) Acute Rehab PT Goals Patient Stated Goal: to go back home  PT Goal Formulation: Patient unable to participate in goal setting (with personal care attendant ) Time For Goal Achievement: 07/14/15 Potential to Achieve Goals: Good    Frequency Min 3X/week   Barriers to discharge        Co-evaluation               End of Session Equipment Utilized During Treatment: Gait belt;Oxygen Activity Tolerance: Patient limited by fatigue Patient left: in chair;with call bell/phone within reach;with chair alarm set;with nursing/sitter in room Nurse Communication: Mobility status         Time: 4193-7902 PT Time Calculation (min) (ACUTE ONLY): 28 min   Charges:   PT Evaluation $Initial PT Evaluation Tier I: 1 Procedure PT Treatments $Therapeutic Activity: 8-22 mins   PT G Codes:        Sarah Baez, SPT 03-Aug-2015, 12:35 PM

## 2015-07-10 DIAGNOSIS — I69351 Hemiplegia and hemiparesis following cerebral infarction affecting right dominant side: Secondary | ICD-10-CM | POA: Diagnosis not present

## 2015-07-10 DIAGNOSIS — I10 Essential (primary) hypertension: Secondary | ICD-10-CM | POA: Diagnosis not present

## 2015-07-10 DIAGNOSIS — Z471 Aftercare following joint replacement surgery: Secondary | ICD-10-CM | POA: Diagnosis not present

## 2015-07-10 DIAGNOSIS — I482 Chronic atrial fibrillation: Secondary | ICD-10-CM | POA: Diagnosis not present

## 2015-07-10 DIAGNOSIS — Z96641 Presence of right artificial hip joint: Secondary | ICD-10-CM | POA: Diagnosis not present

## 2015-07-10 DIAGNOSIS — I69320 Aphasia following cerebral infarction: Secondary | ICD-10-CM | POA: Diagnosis not present

## 2015-07-12 LAB — PTH-RELATED PEPTIDE: PTH-related peptide: <1.1 pmol/L

## 2015-07-12 NOTE — Progress Notes (Signed)
G-Codes for PT evaluation    07/07/15 1135  PT Time Calculation  PT Start Time (ACUTE ONLY) 1049  PT Stop Time (ACUTE ONLY) 1117  PT Time Calculation (min) (ACUTE ONLY) 28 min  PT G-Codes **NOT FOR INPATIENT CLASS**  Functional Assessment Tool Used clinical judgement  Functional Limitation Mobility: Walking and moving around  Mobility: Walking and Moving Around Current Status (X8333) CK  Mobility: Walking and Moving Around Goal Status (O3291) CJ  PT General Charges  $$ ACUTE PT VISIT 1 Procedure  PT Evaluation  $Initial PT Evaluation Tier I 1 Procedure  PT Treatments  $Therapeutic Activity 8-22 mins   Carmelia Bake, PT, DPT 07/12/2015 Pager: [phone]

## 2015-07-13 DIAGNOSIS — Z96641 Presence of right artificial hip joint: Secondary | ICD-10-CM | POA: Diagnosis not present

## 2015-07-13 DIAGNOSIS — I10 Essential (primary) hypertension: Secondary | ICD-10-CM | POA: Diagnosis not present

## 2015-07-13 DIAGNOSIS — Z471 Aftercare following joint replacement surgery: Secondary | ICD-10-CM | POA: Diagnosis not present

## 2015-07-13 DIAGNOSIS — I482 Chronic atrial fibrillation: Secondary | ICD-10-CM | POA: Diagnosis not present

## 2015-07-13 DIAGNOSIS — I69320 Aphasia following cerebral infarction: Secondary | ICD-10-CM | POA: Diagnosis not present

## 2015-07-13 DIAGNOSIS — I69351 Hemiplegia and hemiparesis following cerebral infarction affecting right dominant side: Secondary | ICD-10-CM | POA: Diagnosis not present

## 2015-07-14 DIAGNOSIS — I10 Essential (primary) hypertension: Secondary | ICD-10-CM | POA: Diagnosis not present

## 2015-07-14 DIAGNOSIS — I69320 Aphasia following cerebral infarction: Secondary | ICD-10-CM | POA: Diagnosis not present

## 2015-07-14 DIAGNOSIS — Z96641 Presence of right artificial hip joint: Secondary | ICD-10-CM | POA: Diagnosis not present

## 2015-07-14 DIAGNOSIS — Z471 Aftercare following joint replacement surgery: Secondary | ICD-10-CM | POA: Diagnosis not present

## 2015-07-14 DIAGNOSIS — I482 Chronic atrial fibrillation: Secondary | ICD-10-CM | POA: Diagnosis not present

## 2015-07-14 DIAGNOSIS — I69351 Hemiplegia and hemiparesis following cerebral infarction affecting right dominant side: Secondary | ICD-10-CM | POA: Diagnosis not present

## 2015-07-15 DIAGNOSIS — I10 Essential (primary) hypertension: Secondary | ICD-10-CM | POA: Diagnosis not present

## 2015-07-15 DIAGNOSIS — Z96641 Presence of right artificial hip joint: Secondary | ICD-10-CM | POA: Diagnosis not present

## 2015-07-15 DIAGNOSIS — I69351 Hemiplegia and hemiparesis following cerebral infarction affecting right dominant side: Secondary | ICD-10-CM | POA: Diagnosis not present

## 2015-07-15 DIAGNOSIS — Z471 Aftercare following joint replacement surgery: Secondary | ICD-10-CM | POA: Diagnosis not present

## 2015-07-15 DIAGNOSIS — I4891 Unspecified atrial fibrillation: Secondary | ICD-10-CM | POA: Diagnosis not present

## 2015-07-15 DIAGNOSIS — I482 Chronic atrial fibrillation: Secondary | ICD-10-CM | POA: Diagnosis not present

## 2015-07-15 DIAGNOSIS — I69320 Aphasia following cerebral infarction: Secondary | ICD-10-CM | POA: Diagnosis not present

## 2015-07-19 DIAGNOSIS — S72001D Fracture of unspecified part of neck of right femur, subsequent encounter for closed fracture with routine healing: Secondary | ICD-10-CM | POA: Diagnosis not present

## 2015-07-19 DIAGNOSIS — I69351 Hemiplegia and hemiparesis following cerebral infarction affecting right dominant side: Secondary | ICD-10-CM | POA: Diagnosis not present

## 2015-07-19 DIAGNOSIS — Z471 Aftercare following joint replacement surgery: Secondary | ICD-10-CM | POA: Diagnosis not present

## 2015-07-19 DIAGNOSIS — Z96641 Presence of right artificial hip joint: Secondary | ICD-10-CM | POA: Diagnosis not present

## 2015-07-19 DIAGNOSIS — I482 Chronic atrial fibrillation: Secondary | ICD-10-CM | POA: Diagnosis not present

## 2015-07-19 DIAGNOSIS — I69320 Aphasia following cerebral infarction: Secondary | ICD-10-CM | POA: Diagnosis not present

## 2015-07-19 DIAGNOSIS — I10 Essential (primary) hypertension: Secondary | ICD-10-CM | POA: Diagnosis not present

## 2015-07-20 DIAGNOSIS — I69351 Hemiplegia and hemiparesis following cerebral infarction affecting right dominant side: Secondary | ICD-10-CM | POA: Diagnosis not present

## 2015-07-20 DIAGNOSIS — I10 Essential (primary) hypertension: Secondary | ICD-10-CM | POA: Diagnosis not present

## 2015-07-20 DIAGNOSIS — I482 Chronic atrial fibrillation: Secondary | ICD-10-CM | POA: Diagnosis not present

## 2015-07-20 DIAGNOSIS — Z471 Aftercare following joint replacement surgery: Secondary | ICD-10-CM | POA: Diagnosis not present

## 2015-07-20 DIAGNOSIS — Z96641 Presence of right artificial hip joint: Secondary | ICD-10-CM | POA: Diagnosis not present

## 2015-07-20 DIAGNOSIS — I69320 Aphasia following cerebral infarction: Secondary | ICD-10-CM | POA: Diagnosis not present

## 2015-07-21 DIAGNOSIS — I4891 Unspecified atrial fibrillation: Secondary | ICD-10-CM | POA: Diagnosis not present

## 2015-07-21 DIAGNOSIS — F329 Major depressive disorder, single episode, unspecified: Secondary | ICD-10-CM | POA: Diagnosis not present

## 2015-07-21 DIAGNOSIS — G4709 Other insomnia: Secondary | ICD-10-CM | POA: Diagnosis not present

## 2015-07-21 DIAGNOSIS — E039 Hypothyroidism, unspecified: Secondary | ICD-10-CM | POA: Diagnosis not present

## 2015-07-22 DIAGNOSIS — Z96641 Presence of right artificial hip joint: Secondary | ICD-10-CM | POA: Diagnosis not present

## 2015-07-22 DIAGNOSIS — I69320 Aphasia following cerebral infarction: Secondary | ICD-10-CM | POA: Diagnosis not present

## 2015-07-22 DIAGNOSIS — I482 Chronic atrial fibrillation: Secondary | ICD-10-CM | POA: Diagnosis not present

## 2015-07-22 DIAGNOSIS — I10 Essential (primary) hypertension: Secondary | ICD-10-CM | POA: Diagnosis not present

## 2015-07-22 DIAGNOSIS — Z471 Aftercare following joint replacement surgery: Secondary | ICD-10-CM | POA: Diagnosis not present

## 2015-07-22 DIAGNOSIS — I69351 Hemiplegia and hemiparesis following cerebral infarction affecting right dominant side: Secondary | ICD-10-CM | POA: Diagnosis not present

## 2015-07-25 DIAGNOSIS — I69320 Aphasia following cerebral infarction: Secondary | ICD-10-CM | POA: Diagnosis not present

## 2015-07-25 DIAGNOSIS — I69351 Hemiplegia and hemiparesis following cerebral infarction affecting right dominant side: Secondary | ICD-10-CM | POA: Diagnosis not present

## 2015-07-25 DIAGNOSIS — I482 Chronic atrial fibrillation: Secondary | ICD-10-CM | POA: Diagnosis not present

## 2015-07-25 DIAGNOSIS — Z471 Aftercare following joint replacement surgery: Secondary | ICD-10-CM | POA: Diagnosis not present

## 2015-07-25 DIAGNOSIS — Z96641 Presence of right artificial hip joint: Secondary | ICD-10-CM | POA: Diagnosis not present

## 2015-07-25 DIAGNOSIS — I10 Essential (primary) hypertension: Secondary | ICD-10-CM | POA: Diagnosis not present

## 2015-07-26 DIAGNOSIS — I482 Chronic atrial fibrillation: Secondary | ICD-10-CM | POA: Diagnosis not present

## 2015-07-26 DIAGNOSIS — I69320 Aphasia following cerebral infarction: Secondary | ICD-10-CM | POA: Diagnosis not present

## 2015-07-26 DIAGNOSIS — Z96641 Presence of right artificial hip joint: Secondary | ICD-10-CM | POA: Diagnosis not present

## 2015-07-26 DIAGNOSIS — I69351 Hemiplegia and hemiparesis following cerebral infarction affecting right dominant side: Secondary | ICD-10-CM | POA: Diagnosis not present

## 2015-07-26 DIAGNOSIS — I10 Essential (primary) hypertension: Secondary | ICD-10-CM | POA: Diagnosis not present

## 2015-07-26 DIAGNOSIS — Z471 Aftercare following joint replacement surgery: Secondary | ICD-10-CM | POA: Diagnosis not present

## 2015-07-27 DIAGNOSIS — I10 Essential (primary) hypertension: Secondary | ICD-10-CM | POA: Diagnosis not present

## 2015-07-27 DIAGNOSIS — Z471 Aftercare following joint replacement surgery: Secondary | ICD-10-CM | POA: Diagnosis not present

## 2015-07-27 DIAGNOSIS — I482 Chronic atrial fibrillation: Secondary | ICD-10-CM | POA: Diagnosis not present

## 2015-07-27 DIAGNOSIS — I69351 Hemiplegia and hemiparesis following cerebral infarction affecting right dominant side: Secondary | ICD-10-CM | POA: Diagnosis not present

## 2015-07-27 DIAGNOSIS — Z96641 Presence of right artificial hip joint: Secondary | ICD-10-CM | POA: Diagnosis not present

## 2015-07-27 DIAGNOSIS — I69320 Aphasia following cerebral infarction: Secondary | ICD-10-CM | POA: Diagnosis not present

## 2015-08-01 DIAGNOSIS — Z96641 Presence of right artificial hip joint: Secondary | ICD-10-CM | POA: Diagnosis not present

## 2015-08-01 DIAGNOSIS — I10 Essential (primary) hypertension: Secondary | ICD-10-CM | POA: Diagnosis not present

## 2015-08-01 DIAGNOSIS — I69351 Hemiplegia and hemiparesis following cerebral infarction affecting right dominant side: Secondary | ICD-10-CM | POA: Diagnosis not present

## 2015-08-01 DIAGNOSIS — Z471 Aftercare following joint replacement surgery: Secondary | ICD-10-CM | POA: Diagnosis not present

## 2015-08-01 DIAGNOSIS — I482 Chronic atrial fibrillation: Secondary | ICD-10-CM | POA: Diagnosis not present

## 2015-08-01 DIAGNOSIS — I69320 Aphasia following cerebral infarction: Secondary | ICD-10-CM | POA: Diagnosis not present

## 2015-08-03 DIAGNOSIS — Z96641 Presence of right artificial hip joint: Secondary | ICD-10-CM | POA: Diagnosis not present

## 2015-08-03 DIAGNOSIS — I482 Chronic atrial fibrillation: Secondary | ICD-10-CM | POA: Diagnosis not present

## 2015-08-03 DIAGNOSIS — I10 Essential (primary) hypertension: Secondary | ICD-10-CM | POA: Diagnosis not present

## 2015-08-03 DIAGNOSIS — I69351 Hemiplegia and hemiparesis following cerebral infarction affecting right dominant side: Secondary | ICD-10-CM | POA: Diagnosis not present

## 2015-08-03 DIAGNOSIS — I69320 Aphasia following cerebral infarction: Secondary | ICD-10-CM | POA: Diagnosis not present

## 2015-08-03 DIAGNOSIS — Z471 Aftercare following joint replacement surgery: Secondary | ICD-10-CM | POA: Diagnosis not present

## 2015-08-04 DIAGNOSIS — Z96641 Presence of right artificial hip joint: Secondary | ICD-10-CM | POA: Diagnosis not present

## 2015-08-04 DIAGNOSIS — I482 Chronic atrial fibrillation: Secondary | ICD-10-CM | POA: Diagnosis not present

## 2015-08-04 DIAGNOSIS — Z471 Aftercare following joint replacement surgery: Secondary | ICD-10-CM | POA: Diagnosis not present

## 2015-08-04 DIAGNOSIS — I69320 Aphasia following cerebral infarction: Secondary | ICD-10-CM | POA: Diagnosis not present

## 2015-08-04 DIAGNOSIS — I10 Essential (primary) hypertension: Secondary | ICD-10-CM | POA: Diagnosis not present

## 2015-08-04 DIAGNOSIS — I69351 Hemiplegia and hemiparesis following cerebral infarction affecting right dominant side: Secondary | ICD-10-CM | POA: Diagnosis not present

## 2015-08-05 DIAGNOSIS — Z7902 Long term (current) use of antithrombotics/antiplatelets: Secondary | ICD-10-CM | POA: Diagnosis not present

## 2015-08-05 DIAGNOSIS — R52 Pain, unspecified: Secondary | ICD-10-CM | POA: Diagnosis not present

## 2015-08-05 DIAGNOSIS — J449 Chronic obstructive pulmonary disease, unspecified: Secondary | ICD-10-CM | POA: Diagnosis not present

## 2015-08-07 ENCOUNTER — Emergency Department (HOSPITAL_COMMUNITY): Payer: Medicare Other

## 2015-08-07 ENCOUNTER — Inpatient Hospital Stay (HOSPITAL_COMMUNITY)
Admission: EM | Admit: 2015-08-07 | Discharge: 2015-08-12 | DRG: 183 | Disposition: A | Discharge status: Home / Self Care | Payer: Medicare Other | Attending: Internal Medicine | Admitting: Internal Medicine

## 2015-08-07 ENCOUNTER — Encounter (HOSPITAL_COMMUNITY): Payer: Self-pay | Admitting: *Deleted

## 2015-08-07 DIAGNOSIS — S2231XA Fracture of one rib, right side, initial encounter for closed fracture: Secondary | ICD-10-CM | POA: Diagnosis not present

## 2015-08-07 DIAGNOSIS — W19XXXA Unspecified fall, initial encounter: Secondary | ICD-10-CM | POA: Diagnosis present

## 2015-08-07 DIAGNOSIS — S32019A Unspecified fracture of first lumbar vertebra, initial encounter for closed fracture: Secondary | ICD-10-CM | POA: Diagnosis present

## 2015-08-07 DIAGNOSIS — G2581 Restless legs syndrome: Secondary | ICD-10-CM | POA: Diagnosis present

## 2015-08-07 DIAGNOSIS — Z880 Allergy status to penicillin: Secondary | ICD-10-CM

## 2015-08-07 DIAGNOSIS — K219 Gastro-esophageal reflux disease without esophagitis: Secondary | ICD-10-CM | POA: Diagnosis present

## 2015-08-07 DIAGNOSIS — J90 Pleural effusion, not elsewhere classified: Secondary | ICD-10-CM | POA: Diagnosis not present

## 2015-08-07 DIAGNOSIS — IMO0002 Reserved for concepts with insufficient information to code with codable children: Secondary | ICD-10-CM

## 2015-08-07 DIAGNOSIS — E876 Hypokalemia: Secondary | ICD-10-CM | POA: Diagnosis present

## 2015-08-07 DIAGNOSIS — I69359 Hemiplegia and hemiparesis following cerebral infarction affecting unspecified side: Secondary | ICD-10-CM | POA: Diagnosis not present

## 2015-08-07 DIAGNOSIS — F32A Depression, unspecified: Secondary | ICD-10-CM | POA: Diagnosis present

## 2015-08-07 DIAGNOSIS — M810 Age-related osteoporosis without current pathological fracture: Secondary | ICD-10-CM | POA: Diagnosis present

## 2015-08-07 DIAGNOSIS — I130 Hypertensive heart and chronic kidney disease with heart failure and stage 1 through stage 4 chronic kidney disease, or unspecified chronic kidney disease: Secondary | ICD-10-CM | POA: Diagnosis present

## 2015-08-07 DIAGNOSIS — F329 Major depressive disorder, single episode, unspecified: Secondary | ICD-10-CM | POA: Diagnosis not present

## 2015-08-07 DIAGNOSIS — I69328 Other speech and language deficits following cerebral infarction: Secondary | ICD-10-CM | POA: Diagnosis not present

## 2015-08-07 DIAGNOSIS — E039 Hypothyroidism, unspecified: Secondary | ICD-10-CM | POA: Diagnosis present

## 2015-08-07 DIAGNOSIS — I69320 Aphasia following cerebral infarction: Secondary | ICD-10-CM | POA: Diagnosis not present

## 2015-08-07 DIAGNOSIS — E860 Dehydration: Secondary | ICD-10-CM | POA: Diagnosis present

## 2015-08-07 DIAGNOSIS — Z7901 Long term (current) use of anticoagulants: Secondary | ICD-10-CM | POA: Diagnosis not present

## 2015-08-07 DIAGNOSIS — I69920 Aphasia following unspecified cerebrovascular disease: Secondary | ICD-10-CM

## 2015-08-07 DIAGNOSIS — D72829 Elevated white blood cell count, unspecified: Secondary | ICD-10-CM | POA: Diagnosis present

## 2015-08-07 DIAGNOSIS — S271XXA Traumatic hemothorax, initial encounter: Secondary | ICD-10-CM | POA: Diagnosis present

## 2015-08-07 DIAGNOSIS — S2249XA Multiple fractures of ribs, unspecified side, initial encounter for closed fracture: Secondary | ICD-10-CM | POA: Diagnosis present

## 2015-08-07 DIAGNOSIS — R079 Chest pain, unspecified: Secondary | ICD-10-CM | POA: Diagnosis not present

## 2015-08-07 DIAGNOSIS — I480 Paroxysmal atrial fibrillation: Secondary | ICD-10-CM | POA: Diagnosis present

## 2015-08-07 DIAGNOSIS — J942 Hemothorax: Secondary | ICD-10-CM

## 2015-08-07 DIAGNOSIS — Z515 Encounter for palliative care: Secondary | ICD-10-CM | POA: Insufficient documentation

## 2015-08-07 DIAGNOSIS — J948 Other specified pleural conditions: Secondary | ICD-10-CM | POA: Diagnosis not present

## 2015-08-07 DIAGNOSIS — S225XXS Flail chest, sequela: Secondary | ICD-10-CM | POA: Diagnosis not present

## 2015-08-07 DIAGNOSIS — R0602 Shortness of breath: Secondary | ICD-10-CM | POA: Diagnosis not present

## 2015-08-07 DIAGNOSIS — Z681 Body mass index (BMI) 19 or less, adult: Secondary | ICD-10-CM

## 2015-08-07 DIAGNOSIS — Z79899 Other long term (current) drug therapy: Secondary | ICD-10-CM

## 2015-08-07 DIAGNOSIS — E038 Other specified hypothyroidism: Secondary | ICD-10-CM | POA: Diagnosis present

## 2015-08-07 DIAGNOSIS — T501X5A Adverse effect of loop [high-ceiling] diuretics, initial encounter: Secondary | ICD-10-CM | POA: Diagnosis present

## 2015-08-07 DIAGNOSIS — E43 Unspecified severe protein-calorie malnutrition: Secondary | ICD-10-CM | POA: Diagnosis present

## 2015-08-07 DIAGNOSIS — N183 Chronic kidney disease, stage 3 unspecified: Secondary | ICD-10-CM | POA: Diagnosis present

## 2015-08-07 DIAGNOSIS — Z885 Allergy status to narcotic agent status: Secondary | ICD-10-CM | POA: Diagnosis not present

## 2015-08-07 DIAGNOSIS — Z9889 Other specified postprocedural states: Secondary | ICD-10-CM

## 2015-08-07 DIAGNOSIS — D631 Anemia in chronic kidney disease: Secondary | ICD-10-CM | POA: Diagnosis present

## 2015-08-07 DIAGNOSIS — S2241XA Multiple fractures of ribs, right side, initial encounter for closed fracture: Secondary | ICD-10-CM | POA: Diagnosis present

## 2015-08-07 DIAGNOSIS — J181 Lobar pneumonia, unspecified organism: Secondary | ICD-10-CM | POA: Diagnosis not present

## 2015-08-07 DIAGNOSIS — Z886 Allergy status to analgesic agent status: Secondary | ICD-10-CM | POA: Diagnosis not present

## 2015-08-07 DIAGNOSIS — I5032 Chronic diastolic (congestive) heart failure: Secondary | ICD-10-CM | POA: Diagnosis not present

## 2015-08-07 DIAGNOSIS — J9811 Atelectasis: Secondary | ICD-10-CM | POA: Diagnosis present

## 2015-08-07 DIAGNOSIS — S225XXA Flail chest, initial encounter for closed fracture: Principal | ICD-10-CM | POA: Diagnosis present

## 2015-08-07 DIAGNOSIS — J939 Pneumothorax, unspecified: Secondary | ICD-10-CM | POA: Diagnosis not present

## 2015-08-07 DIAGNOSIS — Z888 Allergy status to other drugs, medicaments and biological substances status: Secondary | ICD-10-CM | POA: Diagnosis not present

## 2015-08-07 DIAGNOSIS — Z7189 Other specified counseling: Secondary | ICD-10-CM | POA: Insufficient documentation

## 2015-08-07 DIAGNOSIS — R6889 Other general symptoms and signs: Secondary | ICD-10-CM | POA: Diagnosis not present

## 2015-08-07 DIAGNOSIS — S2241XS Multiple fractures of ribs, right side, sequela: Secondary | ICD-10-CM | POA: Diagnosis not present

## 2015-08-07 DIAGNOSIS — R109 Unspecified abdominal pain: Secondary | ICD-10-CM | POA: Diagnosis present

## 2015-08-07 DIAGNOSIS — S2249XD Multiple fractures of ribs, unspecified side, subsequent encounter for fracture with routine healing: Secondary | ICD-10-CM | POA: Diagnosis not present

## 2015-08-07 DIAGNOSIS — I10 Essential (primary) hypertension: Secondary | ICD-10-CM | POA: Diagnosis present

## 2015-08-07 HISTORY — DX: Flail chest, initial encounter for closed fracture: S22.5XXA

## 2015-08-07 HISTORY — DX: Multiple fractures of ribs, right side, initial encounter for closed fracture: S22.41XA

## 2015-08-07 HISTORY — DX: Pleural effusion, not elsewhere classified: J90

## 2015-08-07 LAB — URINALYSIS, ROUTINE W REFLEX MICROSCOPIC
Bilirubin Urine: NEGATIVE
Glucose, UA: NEGATIVE mg/dL
Hgb urine dipstick: NEGATIVE
KETONES UR: NEGATIVE mg/dL
LEUKOCYTES UA: NEGATIVE
NITRITE: NEGATIVE
PROTEIN: NEGATIVE mg/dL
Specific Gravity, Urine: 1.015 (ref 1.005–1.030)
pH: 6.5 (ref 5.0–8.0)

## 2015-08-07 LAB — LIPASE, BLOOD: Lipase: 25 U/L (ref 11–51)

## 2015-08-07 LAB — CBC WITH DIFFERENTIAL/PLATELET
Basophils Absolute: 0 10*3/uL (ref 0.0–0.1)
Basophils Relative: 0 %
EOS ABS: 0 10*3/uL (ref 0.0–0.7)
EOS PCT: 0 %
HCT: 29.9 % — ABNORMAL LOW (ref 36.0–46.0)
HEMOGLOBIN: 9.7 g/dL — AB (ref 12.0–15.0)
LYMPHS ABS: 1.3 10*3/uL (ref 0.7–4.0)
LYMPHS PCT: 9 %
MCH: 31.2 pg (ref 26.0–34.0)
MCHC: 32.4 g/dL (ref 30.0–36.0)
MCV: 96.1 fL (ref 78.0–100.0)
MONOS PCT: 9 %
Monocytes Absolute: 1.3 10*3/uL — ABNORMAL HIGH (ref 0.1–1.0)
NEUTROS PCT: 82 %
Neutro Abs: 11.9 10*3/uL — ABNORMAL HIGH (ref 1.7–7.7)
Platelets: 306 10*3/uL (ref 150–400)
RBC: 3.11 MIL/uL — ABNORMAL LOW (ref 3.87–5.11)
RDW: 13.4 % (ref 11.5–15.5)
WBC: 14.5 10*3/uL — ABNORMAL HIGH (ref 4.0–10.5)

## 2015-08-07 LAB — COMPREHENSIVE METABOLIC PANEL
ALBUMIN: 3.5 g/dL (ref 3.5–5.0)
ALT: 27 U/L (ref 14–54)
AST: 36 U/L (ref 15–41)
Alkaline Phosphatase: 119 U/L (ref 38–126)
Anion gap: 9 (ref 5–15)
BILIRUBIN TOTAL: 0.5 mg/dL (ref 0.3–1.2)
BUN: 25 mg/dL — AB (ref 6–20)
CHLORIDE: 98 mmol/L — AB (ref 101–111)
CO2: 33 mmol/L — AB (ref 22–32)
Calcium: 10.9 mg/dL — ABNORMAL HIGH (ref 8.9–10.3)
Creatinine, Ser: 1.06 mg/dL — ABNORMAL HIGH (ref 0.44–1.00)
GFR calc Af Amer: 54 mL/min — ABNORMAL LOW (ref >60)
GFR calc non Af Amer: 47 mL/min — ABNORMAL LOW (ref >60)
GLUCOSE: 147 mg/dL — AB (ref 65–99)
POTASSIUM: 2.9 mmol/L — AB (ref 3.5–5.1)
SODIUM: 140 mmol/L (ref 135–145)
TOTAL PROTEIN: 7 g/dL (ref 6.5–8.1)

## 2015-08-07 LAB — I-STAT TROPONIN, ED: Troponin i, poc: 0.02 ng/mL (ref 0.00–0.08)

## 2015-08-07 LAB — CBG MONITORING, ED: GLUCOSE-CAPILLARY: 136 mg/dL — AB (ref 65–99)

## 2015-08-07 LAB — MRSA PCR SCREENING: MRSA BY PCR: NEGATIVE

## 2015-08-07 MED ORDER — ONDANSETRON HCL 4 MG/2ML IJ SOLN
4.0000 mg | Freq: Four times a day (QID) | INTRAMUSCULAR | Status: DC | PRN
  Administered 2015-08-08: 4 mg via INTRAVENOUS
  Filled 2015-08-07: qty 2

## 2015-08-07 MED ORDER — SODIUM CHLORIDE 0.9 % IJ SOLN
3.0000 mL | Freq: Two times a day (BID) | INTRAMUSCULAR | Status: DC
  Administered 2015-08-08 – 2015-08-12 (×9): 3 mL via INTRAVENOUS

## 2015-08-07 MED ORDER — FUROSEMIDE 40 MG PO TABS
20.0000 mg | ORAL_TABLET | Freq: Every day | ORAL | Status: DC
  Administered 2015-08-07 – 2015-08-12 (×6): 20 mg via ORAL
  Filled 2015-08-07 (×6): qty 1

## 2015-08-07 MED ORDER — LEVOFLOXACIN IN D5W 750 MG/150ML IV SOLN
750.0000 mg | INTRAVENOUS | Status: AC
  Administered 2015-08-07 – 2015-08-11 (×3): 750 mg via INTRAVENOUS
  Filled 2015-08-07 (×2): qty 150

## 2015-08-07 MED ORDER — LEVOFLOXACIN IN D5W 750 MG/150ML IV SOLN
750.0000 mg | INTRAVENOUS | Status: DC
  Filled 2015-08-07: qty 150

## 2015-08-07 MED ORDER — IOHEXOL 300 MG/ML  SOLN
80.0000 mL | Freq: Once | INTRAMUSCULAR | Status: AC | PRN
  Administered 2015-08-07: 80 mL via INTRAVENOUS

## 2015-08-07 MED ORDER — APIXABAN 2.5 MG PO TABS: 2.5000 mg | ORAL_TABLET | Freq: Two times a day (BID) | ORAL | Status: DC

## 2015-08-07 MED ORDER — SODIUM CHLORIDE 0.9 % IV BOLUS (SEPSIS)
500.0000 mL | Freq: Once | INTRAVENOUS | Status: AC
  Administered 2015-08-07: 500 mL via INTRAVENOUS

## 2015-08-07 MED ORDER — HYDRALAZINE HCL 20 MG/ML IJ SOLN: 5.0000 mg | Freq: Four times a day (QID) | INTRAMUSCULAR | Status: DC | PRN

## 2015-08-07 MED ORDER — LEVOTHYROXINE SODIUM 75 MCG PO TABS
75.0000 ug | ORAL_TABLET | Freq: Every day | ORAL | Status: DC
  Administered 2015-08-08 – 2015-08-12 (×5): 75 ug via ORAL
  Filled 2015-08-07 (×5): qty 1

## 2015-08-07 MED ORDER — VITAMIN D3 25 MCG (1000 UNIT) PO TABS
1000.0000 [IU] | ORAL_TABLET | Freq: Two times a day (BID) | ORAL | Status: DC
  Administered 2015-08-07 – 2015-08-12 (×10): 1000 [IU] via ORAL
  Filled 2015-08-07 (×20): qty 1

## 2015-08-07 MED ORDER — POLYVINYL ALCOHOL 1.4 % OP SOLN
1.0000 [drp] | OPHTHALMIC | Status: DC | PRN
  Filled 2015-08-07 (×2): qty 15

## 2015-08-07 MED ORDER — PROPYLENE GLYCOL 0.6 % OP SOLN: Freq: Every day | OPHTHALMIC | Status: DC | PRN

## 2015-08-07 MED ORDER — SODIUM CHLORIDE 0.9 % IV SOLN
INTRAVENOUS | Status: AC
  Administered 2015-08-07: 21:00:00 via INTRAVENOUS

## 2015-08-07 MED ORDER — PANTOPRAZOLE SODIUM 40 MG PO TBEC
40.0000 mg | DELAYED_RELEASE_TABLET | Freq: Every day | ORAL | Status: DC
  Administered 2015-08-07 – 2015-08-12 (×6): 40 mg via ORAL
  Filled 2015-08-07 (×6): qty 1

## 2015-08-07 MED ORDER — SERTRALINE HCL 50 MG PO TABS
50.0000 mg | ORAL_TABLET | Freq: Every day | ORAL | Status: DC
  Administered 2015-08-07 – 2015-08-12 (×6): 50 mg via ORAL
  Filled 2015-08-07 (×6): qty 1

## 2015-08-07 MED ORDER — ADULT MULTIVITAMIN W/MINERALS CH
0.5000 | ORAL_TABLET | Freq: Every day | ORAL | Status: DC
Start: 2015-08-08 — End: 2015-08-07

## 2015-08-07 MED ORDER — ONDANSETRON HCL 4 MG/2ML IJ SOLN: 4.0000 mg | Freq: Once | INTRAMUSCULAR | Status: DC

## 2015-08-07 MED ORDER — ADULT MULTIVITAMIN LIQUID CH
2.5000 mL | Freq: Every day | ORAL | Status: DC
  Filled 2015-08-07: qty 2.5

## 2015-08-07 MED ORDER — METOPROLOL TARTRATE 25 MG PO TABS
25.0000 mg | ORAL_TABLET | Freq: Two times a day (BID) | ORAL | Status: DC
  Administered 2015-08-07 – 2015-08-12 (×10): 25 mg via ORAL
  Filled 2015-08-07 (×10): qty 1

## 2015-08-07 MED ORDER — POTASSIUM CHLORIDE 10 MEQ/100ML IV SOLN
10.0000 meq | INTRAVENOUS | Status: AC
  Administered 2015-08-07 (×2): 10 meq via INTRAVENOUS
  Filled 2015-08-07: qty 100

## 2015-08-07 MED ORDER — IOHEXOL 300 MG/ML  SOLN
50.0000 mL | Freq: Once | INTRAMUSCULAR | Status: AC | PRN
  Administered 2015-08-07: 50 mL via ORAL

## 2015-08-07 MED ORDER — ACETAMINOPHEN 500 MG PO TABS
1000.0000 mg | ORAL_TABLET | Freq: Four times a day (QID) | ORAL | Status: DC | PRN
  Administered 2015-08-08 – 2015-08-12 (×15): 1000 mg via ORAL
  Filled 2015-08-07 (×16): qty 2

## 2015-08-07 MED ORDER — ONDANSETRON HCL 4 MG PO TABS: 4.0000 mg | ORAL_TABLET | Freq: Four times a day (QID) | ORAL | Status: DC | PRN

## 2015-08-07 MED ORDER — ACETAMINOPHEN 325 MG PO TABS
650.0000 mg | ORAL_TABLET | Freq: Once | ORAL | Status: AC
  Administered 2015-08-07: 650 mg via ORAL
  Filled 2015-08-07: qty 2

## 2015-08-07 NOTE — ED Provider Notes (Signed)
CSN: [id number]     Arrival date & time 08/07/15  0909 History   First MD Initiated Contact with Patient 08/07/15 0914     Chief Complaint  Patient presents with  . Flank Pain     (Consider location/radiation/quality/duration/timing/severity/associated sxs/prior Treatment) The history is provided by the EMS personnel and a relative.  CHANEQUA KASPRZYK is a 81 y.o. female hx of afib on eliquis, hypertension, restless leg syndrome, previous stroke with aphasia here presenting with vomiting, abdominal pain. Patient was noted to have several episodes of vomiting yesterday. She was noted to have some pain in the right flank area. The caregiver noticed that there may be some swelling on the right side but that seemed to resolve today. Denies any diarrhea. Denies any fevers. Patient aphasic at baseline and unable to give history.    Level V caveat- dementia, aphasia  Past Medical History  Diagnosis Date  . History of cardiac catheterization 2002    Negative  . Vertebral fracture   . Restless legs syndrome with nocturnal myoclonus 05/13/2013     [place] controlled   . Abnormal uterine bleeding [id number]  . Hypothyroidism   . Arthritis   . Osteoporosis   . History of stomach ulcers   . History of jaundice as a child   . Anemia   . Head pain, chronic     "I have pain in the back of my head for months"  . Dizziness     TAKES MECLIZINE TO TREAT  . Hemiparesis and speech and language deficit as late effects of stroke ([place]) 02/09/2015  . Paroxysmal atrial fibrillation [facility])    Past Surgical History  Procedure Laterality Date  . Back surgery      x 3  . Breast surgery Bilateral     1 lump each breast removed and benign  . Nasal sinus surgery    . Dilation and curettage of uterus  1970  . Catheterization  2003    cardiac cath  . Kyphoplasty  2010  . Cataract extraction Bilateral 2011  . Cholecystectomy  1995    Lap  . Cardiac catheterization  2002    "it was normal" per pt - no record  found  . Robotic assisted salpingo oopherectomy Bilateral 11/02/2014    Procedure: ROBOTIC ASSISTED bilateral SALPINGO OOPHORECTOMY;  Surgeon: Everitt Amber, MD;  Location: [facility];  Service: Gynecology;  Laterality: Bilateral;  . Laparoscopy N/A 11/02/2014    Procedure: LAPAROSCOPY DIAGNOSTIC;  Surgeon: Everitt Amber, MD;  Location: [facility];  Service: Gynecology;  Laterality: N/A;  . Abdominal hysterectomy    . Total hip arthroplasty Right 05/23/2015    Procedure: HEMI-ARTHROPLASTY RIGHT HIP ANTERIOR APPROACH;  Surgeon: Leandrew Koyanagi, MD;  Location: [place];  Service: Orthopedics;  Laterality: Right;   Family History  Problem Relation Age of Onset  . Asthma Brother   . Osteoarthritis Son   . Hypertension Father   . Stroke Father   . Cancer Brother     stomach   Social History  Substance Use Topics  . Smoking status: Never Smoker   . Smokeless tobacco: Never Used  . Alcohol Use: No   OB History    Gravida Para Term Preterm AB TAB SAB Ectopic Multiple Living   2 2       1 3      Review of Systems  Unable to perform ROS: Patient nonverbal  Genitourinary: Positive for flank pain.  All other systems reviewed and are negative.  Allergies  Ibuprofen; Lyrica; Penicillins; Demerol; Dilaudid; Morphine and related; Percocet; Vicodin; Fentanyl; Lidoderm; Other; and Tramadol  Home Medications   Prior to Admission medications   Medication Sig Start Date End Date Taking? Authorizing Provider  acetaminophen (TYLENOL) 500 MG tablet Take 1,000 mg by mouth every 6 (six) hours as needed for mild pain, moderate pain, fever or headache.   Yes Historical Provider, MD  apixaban (ELIQUIS) 2.5 MG TABS tablet Take 1 tablet (2.5 mg total) by mouth 2 (two) times daily. 07/07/15  Yes Oswald Hillock, MD  cholecalciferol (VITAMIN D) 1000 UNITS tablet Take 1,000 Units by mouth 2 (two) times daily.    Yes Historical Provider, MD  ciprofloxacin (CIPRO) 250 MG tablet Take 250 mg by mouth 2 (two) times daily. 07/31/15   Yes Historical Provider, MD  denosumab (PROLIA) 60 MG/ML SOLN injection Inject 60 mg into the skin every 6 (six) months. Administer in upper arm, thigh, or abdomen   Yes Historical Provider, MD  furosemide (LASIX) 20 MG tablet Take 1 tablet (20 mg total) by mouth daily. 07/07/15  Yes Oswald Hillock, MD  levothyroxine (SYNTHROID, LEVOTHROID) 75 MCG tablet Take 75 mcg by mouth daily before breakfast.   Yes Historical Provider, MD  metoprolol tartrate (LOPRESSOR) 25 MG tablet Take 1 tablet (25 mg total) by mouth 2 (two) times daily. 03/25/15  Yes Ivan Anchors Love, PA-C  Multiple Vitamin (MULTIVITAMIN WITH MINERALS) TABS tablet Take 0.5 tablets by mouth daily with lunch.    Yes Historical Provider, MD  pantoprazole (PROTONIX) 40 MG tablet Take 40 mg by mouth daily.    Yes Historical Provider, MD  Propylene Glycol (SYSTANE BALANCE OP) Place 1 drop into both eyes daily as needed (dry eyes).    Yes Historical Provider, MD  sertraline (ZOLOFT) 50 MG tablet Take 50 mg by mouth daily.  05/10/15  Yes Historical Provider, MD  methocarbamol (ROBAXIN) 500 MG tablet Take 1 tablet (500 mg total) by mouth every 6 (six) hours as needed for muscle spasms. Patient not taking: Reported on 07/05/2015 05/25/15   Nishant Dhungel, MD  tamsulosin (FLOMAX) 0.4 MG CAPS capsule Take 1 capsule (0.4 mg total) by mouth daily. Patient not taking: Reported on 07/05/2015 05/25/15   Nishant Dhungel, MD   BP 152/52 mmHg  Pulse 63  Temp(Src) 98.5 F (36.9 C) (Rectal)  Resp 28  SpO2 91%  LMP 09/03/1996 (Approximate) Physical Exam  Constitutional:  Chronically ill, MM slightly dry   HENT:  Head: Normocephalic.  MM slightly dry   Eyes: Conjunctivae are normal. Pupils are equal, round, and reactive to light.  Neck: Normal range of motion. Neck supple.  Cardiovascular: Normal rate, regular rhythm and normal heart sounds.   Pulmonary/Chest: Effort normal and breath sounds normal. No respiratory distress. She has no wheezes. She has no rales.   Abdominal: Soft. Bowel sounds are normal.  ? Swelling R flank, nontender. No obvious mass.   Musculoskeletal: Normal range of motion. She exhibits no edema or tenderness.  Neurological:  Demented, moving all extremities   Skin: Skin is warm and dry.  Psychiatric:  Unable   Nursing note and vitals reviewed.   ED Course  Procedures (including critical care time)  CRITICAL CARE Performed by: Darl Householder, Tanisa Lagace   Total critical care time: 30 minutes  Critical care time was exclusive of separately billable procedures and treating other patients.  Critical care was necessary to treat or prevent imminent or life-threatening deterioration.  Critical care was time spent personally by me  on the following activities: development of treatment plan with patient and/or surrogate as well as nursing, discussions with consultants, evaluation of patient's response to treatment, examination of patient, obtaining history from patient or surrogate, ordering and performing treatments and interventions, ordering and review of laboratory studies, ordering and review of radiographic studies, pulse oximetry and re-evaluation of patient's condition.   Labs Review Labs Reviewed  CBC WITH DIFFERENTIAL/PLATELET - Abnormal; Notable for the following:    WBC 14.5 (*)    RBC 3.11 (*)    Hemoglobin 9.7 (*)    HCT 29.9 (*)    Neutro Abs 11.9 (*)    Monocytes Absolute 1.3 (*)    All other components within normal limits  COMPREHENSIVE METABOLIC PANEL - Abnormal; Notable for the following:    Potassium 2.9 (*)    Chloride 98 (*)    CO2 33 (*)    Glucose, Bld 147 (*)    BUN 25 (*)    Creatinine, Ser 1.06 (*)    Calcium 10.9 (*)    GFR calc non Af Amer 47 (*)    GFR calc Af Amer 54 (*)    All other components within normal limits  CBG MONITORING, ED - Abnormal; Notable for the following:    Glucose-Capillary 136 (*)    All other components within normal limits  URINE CULTURE  CULTURE, BLOOD (ROUTINE X 2)   CULTURE, BLOOD (ROUTINE X 2)  LIPASE, BLOOD  URINALYSIS, ROUTINE W REFLEX MICROSCOPIC (NOT AT [facility])  I-STAT TROPOININ, ED    Imaging Review Ct Chest W Contrast  08/07/2015  CLINICAL DATA:  Right-sided pain with episode of emesis yesterday. Elevated white count. EXAM: CT CHEST, ABDOMEN, AND PELVIS WITH CONTRAST TECHNIQUE: Multidetector CT imaging of the chest, abdomen and pelvis was performed following the standard protocol during bolus administration of intravenous contrast. CONTRAST:  60mL OMNIPAQUE IOHEXOL 300 MG/ML SOLN, 33mL OMNIPAQUE IOHEXOL 300 MG/ML SOLN COMPARISON:  CT chest dated 07/05/2015 and CT abdomen and pelvis dated 03/10/2015 FINDINGS: CT CHEST FINDINGS Mediastinum/Lymph Nodes: Scattered mild atherosclerotic changes again noted along the walls of the normal-caliber thoracic aorta. No aortic aneurysm or dissection. Heart size is normal. No pericardial effusion. No mass or enlarged lymph nodes seen within the mediastinum or perihilar regions. Lungs/Pleura: Bilateral pleural effusions, moderate to large in size, with adjacent compressive atelectasis. No convincing pneumothorax, perhaps trace pneumothorax along the right lateral chest wall, image 27 of series 2. Musculoskeletal: There are displaced fractures of the right sixth through tenth posterior-lateral ribs. The right seventh through ninth ribs are fractured in more than 1 location compatible with flail chest. The right ninth posterior rib fracture is severely comminuted and displaced. The right posterior eighth rib fracture is also significantly displaced, with greater than 1 cm diastases. Patient has chronic compression fracture deformities within the mid to lower thoracic spine status post treatment with vertebroplasty. There is a new mild compression fracture deformity involving the superior endplate of the L1 vertebral body. CT ABDOMEN PELVIS FINDINGS Hepatobiliary: No masses or other significant abnormality. Status post  cholecystectomy. Pancreas: No mass, inflammatory changes, or other significant abnormality. Spleen: Within normal limits in size and appearance. Adrenals/Urinary Tract: No masses identified. No evidence of hydronephrosis. Bladder mildly distended but otherwise unremarkable. Stomach/Bowel: Scattered diverticulosis noted within the sigmoid colon without evidence of acute diverticulitis. Transverse colon and right colon mildly distended with gas and stool. Bowel otherwise normal in caliber and configuration throughout. No evidence of bowel wall thickening or bowel wall inflammation. Vascular/Lymphatic:  Scattered atherosclerotic changes of the normal-caliber abdominal aorta. No acute- appearing vascular abnormality identified. No enlarged lymph nodes seen within the abdomen or pelvis. Reproductive: No mass or other significant abnormality. Other: No free fluid or hemorrhage identified in the abdomen or pelvis. No free intraperitoneal air. Musculoskeletal: Mild compression fracture deformity of the L1 vertebral body which appears acute. Degenerative changes throughout the lumbar spine, at least moderate in degree. Status post previous right hip arthroplasty. Superficial soft tissues are unremarkable. IMPRESSION: 1. Displaced fractures of the right sixth through tenth posterior-lateral ribs. Right seventh through ninth ribs are fractured in more than 1 location indicating flail chest. The right ninth posterior rib fracture is severely comminuted and displaced. The right posterior eighth rib fracture is also significantly displaced, with greater than 1 cm displacement and over-riding of the main fracture fragments. 2. No convincing pneumothorax, perhaps focal trace pneumothorax along the right lateral chest wall. Small amount of associated soft tissue air about the rib fracture sites. 3. Bilateral pleural effusions, moderate to large in size, with adjacent compressive atelectasis. These pleural effusions appear stable  compared to the previous chest CT of 07/05/2015. 4. No acute mediastinal abnormality. 5. Mild acute-appearing compression fracture deformity involving the superior endplate of the L1 vertebral body, new compared to previous CTs, presumably fairly acute. Additional chronic compression fracture deformities within the thoracic spine. 6. No evidence of acute intra-abdominal or intrapelvic abnormality. Fairly large amount of stool and gas within the colon (constipation? ). These results were called by telephone at the time of interpretation on 08/07/2015 at 11:57 am to Dr. Shirlyn Goltz , who verbally acknowledged these results. Electronically Signed   By: Franki Cabot M.D.   On: 08/07/2015 11:58   Ct Abdomen Pelvis W Contrast  08/07/2015  CLINICAL DATA:  Right-sided pain with episode of emesis yesterday. Elevated white count. EXAM: CT CHEST, ABDOMEN, AND PELVIS WITH CONTRAST TECHNIQUE: Multidetector CT imaging of the chest, abdomen and pelvis was performed following the standard protocol during bolus administration of intravenous contrast. CONTRAST:  65mL OMNIPAQUE IOHEXOL 300 MG/ML SOLN, 75mL OMNIPAQUE IOHEXOL 300 MG/ML SOLN COMPARISON:  CT chest dated 07/05/2015 and CT abdomen and pelvis dated 03/10/2015 FINDINGS: CT CHEST FINDINGS Mediastinum/Lymph Nodes: Scattered mild atherosclerotic changes again noted along the walls of the normal-caliber thoracic aorta. No aortic aneurysm or dissection. Heart size is normal. No pericardial effusion. No mass or enlarged lymph nodes seen within the mediastinum or perihilar regions. Lungs/Pleura: Bilateral pleural effusions, moderate to large in size, with adjacent compressive atelectasis. No convincing pneumothorax, perhaps trace pneumothorax along the right lateral chest wall, image 27 of series 2. Musculoskeletal: There are displaced fractures of the right sixth through tenth posterior-lateral ribs. The right seventh through ninth ribs are fractured in more than 1 location  compatible with flail chest. The right ninth posterior rib fracture is severely comminuted and displaced. The right posterior eighth rib fracture is also significantly displaced, with greater than 1 cm diastases. Patient has chronic compression fracture deformities within the mid to lower thoracic spine status post treatment with vertebroplasty. There is a new mild compression fracture deformity involving the superior endplate of the L1 vertebral body. CT ABDOMEN PELVIS FINDINGS Hepatobiliary: No masses or other significant abnormality. Status post cholecystectomy. Pancreas: No mass, inflammatory changes, or other significant abnormality. Spleen: Within normal limits in size and appearance. Adrenals/Urinary Tract: No masses identified. No evidence of hydronephrosis. Bladder mildly distended but otherwise unremarkable. Stomach/Bowel: Scattered diverticulosis noted within the sigmoid colon without evidence of acute  diverticulitis. Transverse colon and right colon mildly distended with gas and stool. Bowel otherwise normal in caliber and configuration throughout. No evidence of bowel wall thickening or bowel wall inflammation. Vascular/Lymphatic: Scattered atherosclerotic changes of the normal-caliber abdominal aorta. No acute- appearing vascular abnormality identified. No enlarged lymph nodes seen within the abdomen or pelvis. Reproductive: No mass or other significant abnormality. Other: No free fluid or hemorrhage identified in the abdomen or pelvis. No free intraperitoneal air. Musculoskeletal: Mild compression fracture deformity of the L1 vertebral body which appears acute. Degenerative changes throughout the lumbar spine, at least moderate in degree. Status post previous right hip arthroplasty. Superficial soft tissues are unremarkable. IMPRESSION: 1. Displaced fractures of the right sixth through tenth posterior-lateral ribs. Right seventh through ninth ribs are fractured in more than 1 location indicating flail  chest. The right ninth posterior rib fracture is severely comminuted and displaced. The right posterior eighth rib fracture is also significantly displaced, with greater than 1 cm displacement and over-riding of the main fracture fragments. 2. No convincing pneumothorax, perhaps focal trace pneumothorax along the right lateral chest wall. Small amount of associated soft tissue air about the rib fracture sites. 3. Bilateral pleural effusions, moderate to large in size, with adjacent compressive atelectasis. These pleural effusions appear stable compared to the previous chest CT of 07/05/2015. 4. No acute mediastinal abnormality. 5. Mild acute-appearing compression fracture deformity involving the superior endplate of the L1 vertebral body, new compared to previous CTs, presumably fairly acute. Additional chronic compression fracture deformities within the thoracic spine. 6. No evidence of acute intra-abdominal or intrapelvic abnormality. Fairly large amount of stool and gas within the colon (constipation? ). These results were called by telephone at the time of interpretation on 08/07/2015 at 11:57 am to Dr. Shirlyn Goltz , who verbally acknowledged these results. Electronically Signed   By: Franki Cabot M.D.   On: 08/07/2015 11:58   Dg Chest Port 1 View  08/07/2015  CLINICAL DATA:  Altered mental status and shortness of breath today, vomiting yesterday per nurse's note. EXAM: PORTABLE CHEST 1 VIEW COMPARISON:  Chest x-ray dated 08/05/2015. FINDINGS: Mild cardiomegaly is unchanged. There is new central pulmonary vascular congestion and bilateral interstitial edema. Moderate sized layering pleural effusion on the left appears grossly stable, with probable adjacent atelectasis. There are acute displaced fractures of the lateral right sixth through eighth ribs, new compared to plain film of 08/05/2015 and chest CT 07/05/2015, with additional questionable slightly displaced fracture of the right posterior fifth rib. No  left-sided rib fracture seen. IMPRESSION: 1. New displaced fractures of the right lateral sixth through eighth ribs, with questionable additional fracture of the posterior right fifth rib. 2. Cardiomegaly with central pulmonary vascular congestion and new bilateral interstitial edema suggesting volume overload/CHF. More confluent opacities at the right lung base suggest either confluent pulmonary edema or lung contusion (given the overlying rib fractures). 3. Moderate sized left pleural effusion, with probable associated atelectasis, appears stable. These results were called by telephone at the time of interpretation on 08/07/2015 at 10:31 am to Dr. Shirlyn Goltz , who verbally acknowledged these results. Electronically Signed   By: Franki Cabot M.D.   On: 08/07/2015 10:33   I have personally reviewed and evaluated these images and lab results as part of my medical decision-making.   EKG Interpretation   Date/Time:  Sunday August 07 2015 09:34:15 EST Ventricular Rate:  55 PR Interval:    QRS Duration: 101 QT Interval:  489 QTC Calculation: 468 R Axis:   -  8 Text Interpretation:  Normal sinus rhythm RSR' in V1 or V2, right VCD or  RVH No significant change since last tracing Confirmed by Jessiah Wojnar  MD, Elbridge Magowan  ([id number]) on 08/07/2015 9:47:44 AM      MDM   Final diagnoses:  None    SKILYN LEVENTRY is a 81 y.o. female here with vomiting, R sided flank pain. Consider SBO vs pyelo vs stone vs pneumonia. Will get labs, CT ab/pel, CXR, UA.   10:30 AM Xray showed multiple rib fractures. Ordered CT chest.  12:30 pm CT showed multiple rib fractures with possible flail chest. Consulted Dr. Harlow Asa from surgery. He recommend admission to [facility] with CT surgery consult and hospitalist admission given comorbidities.   1 pm Consulted CT surgery, Dr. Ricard Dillon. He recommend admission to [facility]. No anticoagulation (even for DVT prophylaxis). No need for plating for now. He will see patient at [facility]. Will admit to  stepdown at [facility] under Dr. Charlies Silvers.    Wandra Arthurs, MD 08/07/15 1314

## 2015-08-07 NOTE — H&P (Addendum)
[facility] History and Physical  Linda [name] [id number] DOB: 08/26/1929 DOA: 08/07/2015  Referring physician: ER physician: Dr Shirlyn Goltz PCP: [name], MD  Chief Complaint: flank pain on the right side  HPI:  81 year old female with past medical history of stroke, non verbal, paroxysmal a fib (on anticoagulation with coumadin), chronic diastolic CHF (last 2 D ECHO in 09/2014 with normal EF), hypertension, last admission early in 07/2015 for hypoxia thought to be due to COPD and CHF. Pt is non verbal at baseline and unable to give any history. Her family is there at the bedside and reports pt had episodes of vomiting and flank pain on the right side. They apparently felt a know on the right side in the flank area. No fevers. No cough. No diarrhea but her family did not know when the bowel movement was. No reports of fall. At baseline, pt able to walk with the walker and assistance but felt really weak over last few days with poor po intake.    In ED, pt was hemodynamically stable. Blood work showed WBC count of 14.5, hemoglobin 9.7, potassium 2.9 (suplemented in ED), Creatinine 1.06 (baseline in 05/2015 was 1.01), calcium 10.9. UA was unremarkable. CXR demonstrated new displaced fractures of the right lateral sixth through eighth ribs, cardiomegaly with central pulmonary vascular congestion and new bilateral interstitial edema suggesting volume overload/CHF. CT chest / abdomen demonstrated displaced fractures of the right sixth through tenth posterior-lateral ribs, fractured in more than 1 location indicating flail chest.There is no convincing pneumothorax, perhaps focal trace pneumothorax along the right lateral chest wall. There are bilateral pleural effusions, moderate to large in size, with adjacent compressive atelectasis and they appear stable compared to the previous chest CT of 07/05/2015. There was also mild acute-appearing compression fracture deformity involving  the superior endplate of the L1 vertebral body.  Assessment & Plan    Principal Problem:   Right flank pain / Multiple rib fractures with flail chest  - CXR demonstrated new displaced fractures of the right lateral sixth through eighth ribs, cardiomegaly with central pulmonary vascular congestion and new bilateral interstitial edema suggesting volume overload/CHF. CT chest / abdomen demonstrated displaced fractures of the right sixth through tenth posterior-lateral ribs, fractured in more than 1 location indicating flail chest.There is no convincing pneumothorax, perhaps focal trace pneumothorax along the right lateral chest wall. There are bilateral pleural effusions, moderate to large in size, with adjacent compressive atelectasis and they appear stable compared to the previous chest CT of 07/05/2015.   - Appreciate Dr, Ricard Dillon recommendations - She will be transferred to [facility] to SDU per CTS request - Supportive care for now  - Only SCD's for DVT prophylaxis if surgery needed  Active Problems:   Lobar pneumonia, unspecified organism ([place]) / Leukocytosis - Started empiric Levaquin considering leukocytosis and compression atelectasis seen on CXR and CT scan - Follow up blood cultures, resp cultures, legionella and strep pneumonia    Chronic diastolic CHF (congestive heart failure) ([place]) / Bilateral pleural effusions - CXR showed CHF - Not in resp distress - Resume lasix per home dose, 20 mg daily - Monitor renal functio - Daily weight and intake and output monitoring - 2 D ECHO in 09/2014 with normal EF    Essential hypertension - Continue lasix, metoprolol per home dose - Added hydralazine PRN for BP above 150/90 - Monitor renal function    Paroxysmal atrial fibrillation (HCC) / Long-term (current) use of anticoagulants - CHADS vasc score -  at least 67 (age, gender, CHF, HTN, CVA) - Rate controlled with metoprolol    Hemiparesis and speech and language deficit as late effects of stroke  ([place]) / 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 - Stable    Hypokalemia - Due to lasix - Supplemented     Chronic depression - Continue Zoloft     Esophageal reflux - Continue protonix    Other specified hypothyroidism - Continue synthroid    CKD (chronic kidney disease) stage 3, GFR 30-59 ml/min - Baseline creatinine 1.01 (in 05/2015) - Cr on this admission 1.06    Anemia of chronic renal failure, stage 3 (moderate)  - Hemoglobin stable     Hypercalcemia - Due to dehydration - Has received IV fluids in ED - Follow up Calcium level in am    Endplate vertebral fracture - Hard to determine the level of pain - Will monitor for now - May need IR involvement for vertebroplasty    Protein-calorie malnutrition, severe ([place]) - In the context of chronic illness - nutrition consulted   DVT prophylaxis:  - SCD's bilaterally   Radiological Exams on Admission: Ct Chest W Contrast 08/07/2015  1. Displaced fractures of the right sixth through tenth posterior-lateral ribs. Right seventh through ninth ribs are fractured in more than 1 location indicating flail chest. The right ninth posterior rib fracture is severely comminuted and displaced. The right posterior eighth rib fracture is also significantly displaced, with greater than 1 cm displacement and over-riding of the main fracture fragments. 2. No convincing pneumothorax, perhaps focal trace pneumothorax along the right lateral chest wall. Small amount of associated soft tissue air about the rib fracture sites. 3. Bilateral pleural effusions, moderate to large in size, with adjacent compressive atelectasis. These pleural effusions appear stable compared to the previous chest CT of 07/05/2015. 4. No acute mediastinal abnormality. 5. Mild acute-appearing compression fracture deformity involving the superior endplate of the L1 vertebral body, new compared to previous CTs, presumably fairly acute. Additional  chronic compression fracture deformities within the thoracic spine. 6. No evidence of acute intra-abdominal or intrapelvic abnormality. Fairly large amount of stool and gas within the colon (constipation? ).  Ct Abdomen Pelvis W Contrast 08/07/2015 1. Displaced fractures of the right sixth through tenth posterior-lateral ribs. Right seventh through ninth ribs are fractured in more than 1 location indicating flail chest. The right ninth posterior rib fracture is severely comminuted and displaced. The right posterior eighth rib fracture is also significantly displaced, with greater than 1 cm displacement and over-riding of the main fracture fragments. 2. No convincing pneumothorax, perhaps focal trace pneumothorax along the right lateral chest wall. Small amount of associated soft tissue air about the rib fracture sites. 3. Bilateral pleural effusions, moderate to large in size, with adjacent compressive atelectasis. These pleural effusions appear stable compared to the previous chest CT of 07/05/2015. 4. No acute mediastinal abnormality. 5. Mild acute-appearing compression fracture deformity involving the superior endplate of the L1 vertebral body, new compared to previous CTs, presumably fairly acute. Additional chronic compression fracture deformities within the thoracic spine. 6. No evidence of acute intra-abdominal or intrapelvic abnormality. Fairly large amount of stool and gas within the colon (constipation? ).  Dg Chest Port 1 View 08/07/2015  1. New displaced fractures of the right lateral sixth through eighth ribs, with questionable additional fracture of the posterior right fifth rib. 2. Cardiomegaly with central pulmonary vascular congestion and new bilateral interstitial edema suggesting volume overload/CHF. More confluent opacities at  the right lung base suggest either confluent pulmonary edema or lung contusion (given the overlying rib fractures). 3. Moderate sized left pleural effusion, with probable  associated atelectasis, appears stable.     Code Status: Full Family Communication: Family not at the bedside  Disposition Plan: Admit for further evaluation, SDU  Leisa Lenz, MD  [facility] Pager [phone]  Time spent in minutes: 75 minutes  Review of Systems:  Pt non verbal so unable to provide details of medical history     Past Medical History  Diagnosis Date  . History of cardiac catheterization 2002    Negative  . Vertebral fracture   . Restless legs syndrome with nocturnal myoclonus 05/13/2013     requip controlled   . Abnormal uterine bleeding [id number]  . Hypothyroidism   . Arthritis   . Osteoporosis   . History of stomach ulcers   . History of jaundice as a child   . Anemia   . Head pain, chronic     "I have pain in the back of my head for months"  . Dizziness     TAKES MECLIZINE TO TREAT  . Hemiparesis and speech and language deficit as late effects of stroke ([place]) 02/09/2015  . Paroxysmal atrial fibrillation [facility])    Past Surgical History  Procedure Laterality Date  . Back surgery      x 3  . Breast surgery Bilateral     1 lump each breast removed and benign  . Nasal sinus surgery    . Dilation and curettage of uterus  1970  . Catheterization  2003    cardiac cath  . Kyphoplasty  2010  . Cataract extraction Bilateral 2011  . Cholecystectomy  1995    Lap  . Cardiac catheterization  2002    "it was normal" per pt - no record found  . Robotic assisted salpingo oopherectomy Bilateral 11/02/2014    Procedure: ROBOTIC ASSISTED bilateral SALPINGO OOPHORECTOMY;  Surgeon: Everitt Amber, MD;  Location: [facility];  Service: Gynecology;  Laterality: Bilateral;  . Laparoscopy N/A 11/02/2014    Procedure: LAPAROSCOPY DIAGNOSTIC;  Surgeon: Everitt Amber, MD;  Location: [facility];  Service: Gynecology;  Laterality: N/A;  . Abdominal hysterectomy    . Total hip arthroplasty Right 05/23/2015    Procedure: HEMI-ARTHROPLASTY RIGHT HIP ANTERIOR APPROACH;  Surgeon: Leandrew Koyanagi, MD;  Location: [place];  Service: Orthopedics;  Laterality: Right;   Social History:  reports that she has never smoked. She has never used smokeless tobacco. She reports that she does not drink alcohol or use illicit drugs.  Allergies  Allergen Reactions  . Ibuprofen Anaphylaxis    Passed out and had to be hospitalized  . Lyrica [Pregabalin] Other (See Comments)    "made me a zombie'  . Penicillins Other (See Comments)    Skin on head was crawling and very very sleepy Has patient had a PCN reaction causing immediate rash, facial/tongue/throat swelling, SOB or lightheadedness with hypotension: No  Has patient had a PCN reaction causing severe rash involving mucus membranes or skin necrosis: No Has patient had a PCN reaction that required hospitalization Unknown Has patient had a PCN reaction occurring within the last 10 years: Unknown If all of the above answers are "NO", then may proceed with Cephalosporin use.   . Demerol [Meperidine Hcl] Nausea And Vomiting  . Dilaudid [Hydromorphone Hcl] Nausea And Vomiting  . Morphine And Related Nausea And Vomiting  . Percocet [Oxycodone-Acetaminophen] Nausea And Vomiting  . Vicodin [  Hydrocodone-Acetaminophen] Nausea And Vomiting  . Fentanyl Other (See Comments)    unknown  . Lidoderm [Lidocaine] Other (See Comments)    unknown  . Other Other (See Comments)    hamburger  . Tramadol Other (See Comments)    unknown    Family History:  Family History  Problem Relation Age of Onset  . Asthma Brother   . Osteoarthritis Son   . Hypertension Father   . Stroke Father   . Cancer Brother     stomach     Prior to Admission medications   Medication Sig Start Date End Date Taking? Authorizing Provider  acetaminophen (TYLENOL) 500 MG tablet Take 1,000 mg by mouth every 6 (six) hours as needed for mild pain, moderate pain, fever or headache.   Yes Historical Provider, MD  apixaban (ELIQUIS) 2.5 MG TABS tablet Take 1 tablet (2.5 mg total) by  mouth 2 (two) times daily. 07/07/15  Yes Oswald Hillock, MD  cholecalciferol (VITAMIN D) 1000 UNITS tablet Take 1,000 Units by mouth 2 (two) times daily.    Yes Historical Provider, MD  ciprofloxacin (CIPRO) 250 MG tablet Take 250 mg by mouth 2 (two) times daily. 07/31/15  Yes Historical Provider, MD  denosumab (PROLIA) 60 MG/ML SOLN injection Inject 60 mg into the skin every 6 (six) months. Administer in upper arm, thigh, or abdomen   Yes Historical Provider, MD  furosemide (LASIX) 20 MG tablet Take 1 tablet (20 mg total) by mouth daily. 07/07/15  Yes Oswald Hillock, MD  levothyroxine (SYNTHROID, LEVOTHROID) 75 MCG tablet Take 75 mcg by mouth daily before breakfast.   Yes Historical Provider, MD  metoprolol tartrate (LOPRESSOR) 25 MG tablet Take 1 tablet (25 mg total) by mouth 2 (two) times daily. 03/25/15  Yes Ivan Anchors Love, PA-C  Multiple Vitamin (MULTIVITAMIN WITH MINERALS) TABS tablet Take 0.5 tablets by mouth daily with lunch.    Yes Historical Provider, MD  pantoprazole (PROTONIX) 40 MG tablet Take 40 mg by mouth daily.    Yes Historical Provider, MD  Propylene Glycol (SYSTANE BALANCE OP) Place 1 drop into both eyes daily as needed (dry eyes).    Yes Historical Provider, MD  sertraline (ZOLOFT) 50 MG tablet Take 50 mg by mouth daily.  05/10/15  Yes Historical Provider, MD  methocarbamol (ROBAXIN) 500 MG tablet Take 1 tablet (500 mg total) by mouth every 6 (six) hours as needed for muscle spasms. Patient not taking: Reported on 07/05/2015 05/25/15   Nishant Dhungel, MD  tamsulosin (FLOMAX) 0.4 MG CAPS capsule Take 1 capsule (0.4 mg total) by mouth daily. Patient not taking: Reported on 07/05/2015 05/25/15   Louellen Molder, MD   Physical Exam: Filed Vitals:   08/07/15 0918 08/07/15 0933 08/07/15 1137  BP: 148/69  152/52  Pulse: 66  63  Temp:  98.5 F (36.9 C)   TempSrc: Oral Rectal   Resp: 16  28  SpO2:   91%    Physical Exam  Constitutional: Appears well-developed and well-nourished. No  distress. Non verbal HENT: Normocephalic. No tonsillar erythema or exudates Eyes: Conjunctivae are normal. No scleral icterus.  Neck: Normal ROM. Neck supple. No JVD. No tracheal deviation. No thyromegaly.  CVS: rate controlled, S1/S2 +, no murmurs, no gallops, no carotid bruit.  Pulmonary: Effort and breath sounds normal, no stridor, rhonchi, wheezes, rales.  Abdominal: Soft. BS +,  no distension, tenderness, rebound or guarding.  Musculoskeletal: Normal range of motion. No edema and no tenderness.  Lymphadenopathy: No lymphadenopathy noted, cervical,  inguinal. Neuro: Alert.No focal neurologic deficits. Skin: Skin is warm and dry. No rash noted.   Psychiatric: Normal mood and affect. Behavior normal.   Labs on Admission:  Basic Metabolic Panel:  Recent Labs Lab 08/07/15 0945  NA 140  K 2.9*  CL 98*  CO2 33*  GLUCOSE 147*  BUN 25*  CREATININE 1.06*  CALCIUM 10.9*   Liver Function Tests:  Recent Labs Lab 08/07/15 0945  AST 36  ALT 27  ALKPHOS 119  BILITOT 0.5  PROT 7.0  ALBUMIN 3.5    Recent Labs Lab 08/07/15 0945  LIPASE 25   No results for input(s): AMMONIA in the last 168 hours. CBC:  Recent Labs Lab 08/07/15 0945  WBC 14.5*  NEUTROABS 11.9*  HGB 9.7*  HCT 29.9*  MCV 96.1  PLT 306   Cardiac Enzymes: No results for input(s): CKTOTAL, CKMB, CKMBINDEX, TROPONINI in the last 168 hours. BNP: Invalid input(s): POCBNP CBG:  Recent Labs Lab 08/07/15 0941  GLUCAP 136*    If 7PM-7AM, please contact night-coverage [email or web address] Password [email or web address] 08/07/2015, 1:17 PM

## 2015-08-07 NOTE — ED Notes (Signed)
Patient lives at home with family and caregivers. Patient had to episodes of vomiting without diarrhea yesterday and was grimacing when touch on the right flank during bathing. Family thought they saw and felt a knot on her right side yesterday, not noted on assessment today.

## 2015-08-07 NOTE — ED Notes (Signed)
Nurse at bedside starting iv/ collecting labs

## 2015-08-07 NOTE — Progress Notes (Signed)
ANTIBIOTIC CONSULT NOTE - INITIAL  Pharmacy Consult for levaquin Indication: pneumonia  Allergies  Allergen Reactions  . Ibuprofen Anaphylaxis    Passed out and had to be hospitalized  . Lyrica [Pregabalin] Other (See Comments)    "made me a zombie'  . Penicillins Other (See Comments)    Skin on head was crawling and very very sleepy Has patient had a PCN reaction causing immediate rash, facial/tongue/throat swelling, SOB or lightheadedness with hypotension: No  Has patient had a PCN reaction causing severe rash involving mucus membranes or skin necrosis: No Has patient had a PCN reaction that required hospitalization Unknown Has patient had a PCN reaction occurring within the last 10 years: Unknown If all of the above answers are "NO", then may proceed with Cephalosporin use.   . Demerol [Meperidine Hcl] Nausea And Vomiting  . Dilaudid [Hydromorphone Hcl] Nausea And Vomiting  . Morphine And Related Nausea And Vomiting  . Percocet [Oxycodone-Acetaminophen] Nausea And Vomiting  . Vicodin [Hydrocodone-Acetaminophen] Nausea And Vomiting  . Fentanyl Other (See Comments)    unknown  . Lidoderm [Lidocaine] Other (See Comments)    unknown  . Other Other (See Comments)    hamburger  . Tramadol Other (See Comments)    unknown    Patient Measurements:    Vital Signs: Temp: 98.5 F (36.9 C) (12/04 0933) Temp Source: Rectal (12/04 0933) BP: 152/52 mmHg (12/04 1137) Pulse Rate: 63 (12/04 1137) Intake/Output from previous day:   Intake/Output from this shift: Total I/O In: 500 [IV Piggyback:500] Out: -   Labs:  Recent Labs  08/07/15 0945  WBC 14.5*  HGB 9.7*  PLT 306  CREATININE 1.06*   CrCl cannot be calculated (Unknown ideal weight.). No results for input(s): VANCOTROUGH, VANCOPEAK, VANCORANDOM, GENTTROUGH, GENTPEAK, GENTRANDOM, TOBRATROUGH, TOBRAPEAK, TOBRARND, AMIKACINPEAK, AMIKACINTROU, AMIKACIN in the last 72 hours.   Microbiology: No results found for this or  any previous visit (from the past 720 hour(s)).  Medical History: Past Medical History  Diagnosis Date  . History of cardiac catheterization 2002    Negative  . Vertebral fracture   . Restless legs syndrome with nocturnal myoclonus 05/13/2013     requip controlled   . Abnormal uterine bleeding [id number]  . Hypothyroidism   . Arthritis   . Osteoporosis   . History of stomach ulcers   . History of jaundice as a child   . Anemia   . Head pain, chronic     "I have pain in the back of my head for months"  . Dizziness     TAKES MECLIZINE TO TREAT  . Hemiparesis and speech and language deficit as late effects of stroke ([place]) 02/09/2015  . Paroxysmal atrial fibrillation [facility])     Assessment: Patient's an 81 y.o F on Eliquis PTA for afib who presented to the ED on 12/04 with c/o right frank pain.  Abd pelvis CT showed ribs fractures.  To start levaquin for suspected PNA.  - afeb, wbc 14.5, scr 1.06 (crcl~27)  12/4 LVQ>>  12/4 ucx: 12/4 bcx x2:   Goal of Therapy:  Eradication of infection  Plan:  - change levaquin to 750 mg IV q48h for 5 days - pharmacy will sign off for levaquin as crcl will remain <50 d/t age  60, 51 08/07/2015,1:24 PM

## 2015-08-07 NOTE — Consult Note (Addendum)
[address]       [place] [address]             [phone]          CARDIOTHORACIC SURGERY CONSULTATION REPORT  PCP is [name], MD Referring Provider is Wandra Arthurs, MD Primary Cardiologist is Jacolyn Reedy, MD Primary Neurologist is Kathrynn Ducking, MD PMR Specialist is [name], MD  Reason for consultation:  Multiple rib fractures and flail chest  HPI:  Patient is an 81 year old female with hypertension and chronic diastolic congestive heart failure who suffered an embolic stroke in the left MCA distribution on 12/25/2014. She was noted to be in atrial fibrillation at the time. She developed right hemiparesis and severe expressive aphasia.  She has been chronically anticoagulated ever since using Eliquis for paroxysmal atrial fibrillation.  She has been hospitalized numerous times since then and according to her son she has suffered 2 or 3 additional "mini strokes". For the most part she has been living at home with one of her 3 sons who cares for her.  In September she suffered a fall that resulted in a closed right hip fracture requiring right total hip arthroplasty.  She was discharged from the hospital to [facility] for rehabilitation but eventually returned home again where she lives with her son. He has arranged for in-home care to assist with her management while he is at work.  Shortly after she returned home from the nursing home she was hospitalized for several days in early November because of profound weakness. She was noted to have bilateral pleural effusions, left greater than right and severe protein depleted malnutrition complicating underlying severe weakness, limited mobility, and chronic diastolic congestive heart failure.   The patient was brought to the emergency department earlier today by her son because she has been experiencing right-sided pain for the past week or so.  She was taken to her primary care  physician's office 2 days ago for the same problem and some blood work and urine specimens were obtained due to concerns that she might have a urinary tract infection.  Since then one of the patient's caregivers had noticed some swelling on the right side. The patient was brought to the ED where CT scan of the chest abdomen and pelvis demonstrates multiple displaced rib fractures on the right side with a small segment of flail chest. There are bilateral pleural effusions, left greater than right, which appear essentially stable in comparison with CT scan performed 07/05/2015. Cardiothoracic surgical consultation was requested.  The patient is evaluated in the emergency department at [facility].  Two of the patient's sons are at the bedside including the son who is the primary caregiver. There is no recent history of trauma of any kind. No recent history of falls reported by any of the patient's caregivers. The patient has been experiencing right-sided pain off and on for approximately 1 week. At times she appears dyspneic but this seems to correlate with when she is in pain. There is no history of fevers or chills. Appetite is poor. She had some vomiting but for the most part she just hasn't been eating. In the emergency department the patient opens her eyes and responds to verbal commands. She is aphasic. She does nod her head when asked if she is in pain. She shakes her head no when asked if she has shortness of breath.  The patient's son state that they have not had  any conversations with of her caregivers regarding regarding long-term prognosis, expectations, or treatment goals.  History reviewed. No pertinent past medical history.  Past Surgical History  Procedure Laterality Date  . Back surgery      x 3  . Breast surgery Bilateral     1 lump each breast removed and benign  . Nasal sinus surgery    . Dilation and curettage of uterus  1970  . Catheterization  2003    cardiac cath  . Kyphoplasty  2010    . Cataract extraction Bilateral 2011  . Cholecystectomy  1995    Lap  . Cardiac catheterization  2002    "it was normal" per pt - no record found  . Robotic assisted salpingo oopherectomy Bilateral 11/02/2014    Procedure: ROBOTIC ASSISTED bilateral SALPINGO OOPHORECTOMY;  Surgeon: Everitt Amber, MD;  Location: [facility];  Service: Gynecology;  Laterality: Bilateral;  . Laparoscopy N/A 11/02/2014    Procedure: LAPAROSCOPY DIAGNOSTIC;  Surgeon: Everitt Amber, MD;  Location: [facility];  Service: Gynecology;  Laterality: N/A;  . Abdominal hysterectomy    . Total hip arthroplasty Right 05/23/2015    Procedure: HEMI-ARTHROPLASTY RIGHT HIP ANTERIOR APPROACH;  Surgeon: Leandrew Koyanagi, MD;  Location: [facility];  Service: Orthopedics;  Laterality: Right;    Family History  Problem Relation Age of Onset  . Asthma Brother   . Osteoarthritis Son   . Hypertension Father   . Stroke Father   . Cancer Brother     stomach    Social History   Social History  . Marital Status: Widowed    Spouse Name: N/A  . Number of Children: 3  . Years of Education: some coll.   Occupational History  . retired     Primary school teacher position   Social History Main Topics  . Smoking status: Never Smoker   . Smokeless tobacco: Never Used  . Alcohol Use: No  . Drug Use: No  . Sexual Activity: No   Other Topics Concern  . Not on file   Social History Narrative   Patient is widowed with 3 children.   Patient is right handed.   Patient has some college education.   Patient drinks 1-2 cups some days of the week.    Prior to Admission medications   Medication Sig Start Date End Date Taking? Authorizing Provider  acetaminophen (TYLENOL) 500 MG tablet Take 1,000 mg by mouth every 6 (six) hours as needed for mild pain, moderate pain, fever or headache.   Yes Historical Provider, MD  apixaban (ELIQUIS) 2.5 MG TABS tablet Take 1 tablet (2.5 mg total) by mouth 2 (two) times daily. 07/07/15  Yes Oswald Hillock, MD  cholecalciferol  (VITAMIN D) 1000 UNITS tablet Take 1,000 Units by mouth 2 (two) times daily.    Yes Historical Provider, MD  ciprofloxacin (CIPRO) 250 MG tablet Take 250 mg by mouth 2 (two) times daily. 07/31/15  Yes Historical Provider, MD  denosumab (PROLIA) 60 MG/ML SOLN injection Inject 60 mg into the skin every 6 (six) months. Administer in upper arm, thigh, or abdomen   Yes Historical Provider, MD  furosemide (LASIX) 20 MG tablet Take 1 tablet (20 mg total) by mouth daily. 07/07/15  Yes Oswald Hillock, MD  levothyroxine (SYNTHROID, LEVOTHROID) 75 MCG tablet Take 75 mcg by mouth daily before breakfast.   Yes Historical Provider, MD  metoprolol tartrate (LOPRESSOR) 25 MG tablet Take 1 tablet (25 mg total) by mouth 2 (two) times daily.  03/25/15  Yes Ivan Anchors Love, PA-C  Multiple Vitamin (MULTIVITAMIN WITH MINERALS) TABS tablet Take 0.5 tablets by mouth daily with lunch.    Yes Historical Provider, MD  pantoprazole (PROTONIX) 40 MG tablet Take 40 mg by mouth daily.    Yes Historical Provider, MD  Propylene Glycol (SYSTANE BALANCE OP) Place 1 drop into both eyes daily as needed (dry eyes).    Yes Historical Provider, MD  sertraline (ZOLOFT) 50 MG tablet Take 50 mg by mouth daily.  05/10/15  Yes Historical Provider, MD  methocarbamol (ROBAXIN) 500 MG tablet Take 1 tablet (500 mg total) by mouth every 6 (six) hours as needed for muscle spasms. Patient not taking: Reported on 07/05/2015 05/25/15   Nishant Dhungel, MD  tamsulosin (FLOMAX) 0.4 MG CAPS capsule Take 1 capsule (0.4 mg total) by mouth daily. Patient not taking: Reported on 07/05/2015 05/25/15   Louellen Molder, MD    Current Facility-Administered Medications  Medication Dose Route Frequency Provider Last Rate Last Dose  . 0.9 %  sodium chloride infusion   Intravenous Continuous Robbie Lis, MD      . acetaminophen (TYLENOL) tablet 1,000 mg  1,000 mg Oral Q6H PRN Robbie Lis, MD      . cholecalciferol (VITAMIN D) tablet 1,000 Units  1,000 Units Oral BID Robbie Lis, MD      . hydrALAZINE (APRESOLINE) injection 5 mg  5 mg Intravenous Q6H PRN Robbie Lis, MD      . levofloxacin (LEVAQUIN) IVPB 750 mg  750 mg Intravenous Q48H Anh Carlynn Purl, RPH   Stopped at 08/07/15 1525  . [START ON 08/08/2015] levothyroxine (SYNTHROID, LEVOTHROID) tablet 75 mcg  75 mcg Oral QAC breakfast Robbie Lis, MD      . metoprolol tartrate (LOPRESSOR) tablet 25 mg  25 mg Oral BID Robbie Lis, MD      . Derrill Memo ON 08/08/2015] multivitamin with minerals tablet 0.5 tablet  0.5 tablet Oral Q lunch Robbie Lis, MD      . ondansetron [facility]) tablet 4 mg  4 mg Oral Q6H PRN Robbie Lis, MD       Or  . ondansetron [facility]) injection 4 mg  4 mg Intravenous Q6H PRN Robbie Lis, MD      . pantoprazole (PROTONIX) EC tablet 40 mg  40 mg Oral Daily Robbie Lis, MD      . Propylene Glycol 0.6 % SOLN   Both Eyes Daily PRN Robbie Lis, MD      . sertraline (ZOLOFT) tablet 50 mg  50 mg Oral Daily Robbie Lis, MD      . sodium chloride 0.9 % injection 3 mL  3 mL Intravenous Q12H Robbie Lis, MD       Current Outpatient Prescriptions  Medication Sig Dispense Refill  . acetaminophen (TYLENOL) 500 MG tablet Take 1,000 mg by mouth every 6 (six) hours as needed for mild pain, moderate pain, fever or headache.    Marland Kitchen apixaban (ELIQUIS) 2.5 MG TABS tablet Take 1 tablet (2.5 mg total) by mouth 2 (two) times daily. 60 tablet 2  . cholecalciferol (VITAMIN D) 1000 UNITS tablet Take 1,000 Units by mouth 2 (two) times daily.     . ciprofloxacin (CIPRO) 250 MG tablet Take 250 mg by mouth 2 (two) times daily.  0  . denosumab (PROLIA) 60 MG/ML SOLN injection Inject 60 mg into the skin every 6 (six) months. Administer in upper arm, thigh, or  abdomen    . furosemide (LASIX) 20 MG tablet Take 1 tablet (20 mg total) by mouth daily. 30 tablet 2  . levothyroxine (SYNTHROID, LEVOTHROID) 75 MCG tablet Take 75 mcg by mouth daily before breakfast.    . metoprolol tartrate (LOPRESSOR) 25 MG tablet Take 1  tablet (25 mg total) by mouth 2 (two) times daily. 30 tablet 1  . Multiple Vitamin (MULTIVITAMIN WITH MINERALS) TABS tablet Take 0.5 tablets by mouth daily with lunch.     . pantoprazole (PROTONIX) 40 MG tablet Take 40 mg by mouth daily.     Marland Kitchen Propylene Glycol (SYSTANE BALANCE OP) Place 1 drop into both eyes daily as needed (dry eyes).     . sertraline (ZOLOFT) 50 MG tablet Take 50 mg by mouth daily.   3  . methocarbamol (ROBAXIN) 500 MG tablet Take 1 tablet (500 mg total) by mouth every 6 (six) hours as needed for muscle spasms. (Patient not taking: Reported on 07/05/2015) 30 tablet 0  . tamsulosin (FLOMAX) 0.4 MG CAPS capsule Take 1 capsule (0.4 mg total) by mouth daily. (Patient not taking: Reported on 07/05/2015) 30 capsule 0    Allergies  Allergen Reactions  . Ibuprofen Anaphylaxis    Passed out and had to be hospitalized  . Lyrica [Pregabalin] Other (See Comments)    "made me a zombie'  . Penicillins Other (See Comments)    Skin on head was crawling and very very sleepy Has patient had a PCN reaction causing immediate rash, facial/tongue/throat swelling, SOB or lightheadedness with hypotension: No  Has patient had a PCN reaction causing severe rash involving mucus membranes or skin necrosis: No Has patient had a PCN reaction that required hospitalization Unknown Has patient had a PCN reaction occurring within the last 10 years: Unknown If all of the above answers are "NO", then may proceed with Cephalosporin use.   . Demerol [Meperidine Hcl] Nausea And Vomiting  . Dilaudid [Hydromorphone Hcl] Nausea And Vomiting  . Morphine And Related Nausea And Vomiting  . Percocet [Oxycodone-Acetaminophen] Nausea And Vomiting  . Vicodin [Hydrocodone-Acetaminophen] Nausea And Vomiting  . Fentanyl Other (See Comments)    unknown  . Lidoderm [Lidocaine] Other (See Comments)    unknown  . Other Other (See Comments)    hamburger  . Tramadol Other (See Comments)    unknown      Review of  Systems:  Per HPI.  Remainder non-contributory     Physical Exam:   BP 165/68 mmHg  Pulse 61  Temp(Src) 98.5 F (36.9 C) (Rectal)  Resp 26  SpO2 98%  LMP 09/03/1996 (Approximate)  General:  Elderly, cachectic and frail-appearing  HEENT:  Unremarkable   Neck:   no JVD, no bruits, no adenopathy   Chest:   Tender on palpation of right chest, diminished breath sounds at bases, otherwise clear  CV:   RRR, no  murmur   Abdomen:  soft, non-tender, no masses   Extremities:  warm, well-perfused, pulses diminished, no lower extremity edema  Rectal/GU  Deferred  Neuro:   Grossly non-focal and symmetrical throughout  Skin:   Clean and dry, no rashes, no breakdown  Diagnostic Tests:  CT CHEST, ABDOMEN, AND PELVIS WITH CONTRAST  TECHNIQUE: Multidetector CT imaging of the chest, abdomen and pelvis was performed following the standard protocol during bolus administration of intravenous contrast.  CONTRAST: 54mL OMNIPAQUE IOHEXOL 300 MG/ML SOLN, 69mL OMNIPAQUE IOHEXOL 300 MG/ML SOLN  COMPARISON: CT chest dated 07/05/2015 and CT abdomen and pelvis dated 03/10/2015  FINDINGS: CT CHEST FINDINGS  Mediastinum/Lymph Nodes: Scattered mild atherosclerotic changes again noted along the walls of the normal-caliber thoracic aorta. No aortic aneurysm or dissection. Heart size is normal. No pericardial effusion. No mass or enlarged lymph nodes seen within the mediastinum or perihilar regions.  Lungs/Pleura: Bilateral pleural effusions, moderate to large in size, with adjacent compressive atelectasis. No convincing pneumothorax, perhaps trace pneumothorax along the right lateral chest wall, image 27 of series 2.  Musculoskeletal: There are displaced fractures of the right sixth through tenth posterior-lateral ribs. The right seventh through ninth ribs are fractured in more than 1 location compatible with flail chest. The right ninth posterior rib fracture is severely comminuted  and displaced. The right posterior eighth rib fracture is also significantly displaced, with greater than 1 cm diastases.  Patient has chronic compression fracture deformities within the mid to lower thoracic spine status post treatment with vertebroplasty. There is a new mild compression fracture deformity involving the superior endplate of the L1 vertebral body.  CT ABDOMEN PELVIS FINDINGS  Hepatobiliary: No masses or other significant abnormality. Status post cholecystectomy.  Pancreas: No mass, inflammatory changes, or other significant abnormality.  Spleen: Within normal limits in size and appearance.  Adrenals/Urinary Tract: No masses identified. No evidence of hydronephrosis. Bladder mildly distended but otherwise unremarkable.  Stomach/Bowel: Scattered diverticulosis noted within the sigmoid colon without evidence of acute diverticulitis. Transverse colon and right colon mildly distended with gas and stool. Bowel otherwise normal in caliber and configuration throughout. No evidence of bowel wall thickening or bowel wall inflammation.  Vascular/Lymphatic: Scattered atherosclerotic changes of the normal-caliber abdominal aorta. No acute- appearing vascular abnormality identified. No enlarged lymph nodes seen within the abdomen or pelvis.  Reproductive: No mass or other significant abnormality.  Other: No free fluid or hemorrhage identified in the abdomen or pelvis. No free intraperitoneal air.  Musculoskeletal: Mild compression fracture deformity of the L1 vertebral body which appears acute. Degenerative changes throughout the lumbar spine, at least moderate in degree. Status post previous right hip arthroplasty. Superficial soft tissues are unremarkable.  IMPRESSION: 1. Displaced fractures of the right sixth through tenth posterior-lateral ribs. Right seventh through ninth ribs are fractured in more than 1 location indicating flail chest. The  right ninth posterior rib fracture is severely comminuted and displaced. The right posterior eighth rib fracture is also significantly displaced, with greater than 1 cm displacement and over-riding of the main fracture fragments. 2. No convincing pneumothorax, perhaps focal trace pneumothorax along the right lateral chest wall. Small amount of associated soft tissue air about the rib fracture sites. 3. Bilateral pleural effusions, moderate to large in size, with adjacent compressive atelectasis. These pleural effusions appear stable compared to the previous chest CT of 07/05/2015. 4. No acute mediastinal abnormality. 5. Mild acute-appearing compression fracture deformity involving the superior endplate of the L1 vertebral body, new compared to previous CTs, presumably fairly acute. Additional chronic compression fracture deformities within the thoracic spine. 6. No evidence of acute intra-abdominal or intrapelvic abnormality. Fairly large amount of stool and gas within the colon (constipation? ). These results were called by telephone at the time of interpretation on 08/07/2015 at 11:57 am to Dr. Shirlyn Goltz , who verbally acknowledged these results.   Electronically Signed  By: Franki Cabot M.D.  On: 08/07/2015 11:58    Impression:  I have personally reviewed the patient's CT scan and discussed the findings at length with the patient's sons at her bedside.  Patient has multiple right-sided rib fractures including the  6th through the 10th ribs straight laterally as well as multiple breaks involving the 7th, 8th and 9th ribs, resulting in a small flail segment.  The 8th rib fracture is severely displaced.  There is no reported history of any known trauma, but the onset of pain began sometime within the last week or so.  The patient is extremely frail and elderly and has been chronically debilitated ever since she suffered a stroke last spring. Although she had reportedly recovered  somewhat over the summer, she has gone downhill ever since she suffered a fall and hip fracture in September.  She appears to be frail enough that traumatic rib fracture might have occurred with relatively minimal force, such as lifting her from the bed.  She would not be considered a candidate for rib plating under any circumstances because of her advanced age, extremely debilitated physical condition, and poor overall long-term prognosis.  The patient is breathing comfortably at this time, although she clearly experiences significant pain with deep inspiration. She has bilateral pleural effusions left greater than right which are chronic and similar in size to how they appeared on CT scan performed 07/05/2015.   There are no findings to suggest that the patient has developed hemothorax as a complication of her rib fractures and there are no clear indications that either effusion needs to be drained at that time.   Plan:  I recommend holding all forms of pharmacologic anticoagulation for at least 48 hours to make sure that the patient doesn't develop signs of bleeding related to her rib fractures. Chest tube placement, Pleurx catheter placement, or needle thoracentesis could be considered for palliative purposes if the patient develops worsening shortness of breath and radiographic signs of enlarging pleural effusions and/or hemothorax.  She will remain at very high risk for the development of pneumonia and/or acute respiratory failure.  However, primary goals of therapy should be related to adequate pain control.   I spent a considerable amount of time discussing the patient's underlying chronic medical problems and goals of therapy with the patient's sons at the bedside. Nobody has had similar discussions with them in the past, and they would be interested in meeting with professionals from the palliative care team while the patient is in the hospital at this time. They are remarkably intelligent and  supportive and hope to bring the patient home again as soon as possible once her condition has been stabilized. They now understand that her long-term prognosis is not good.  In my opinion CODE STATUS should be readdressed.   I spent in excess of 120 minutes during the conduct of this hospital consultation and >50% of this time involved direct face-to-face encounter for counseling and/or coordination of the patient's care.   Valentina Gu. Roxy Manns, MD 08/07/2015 7:11 PM

## 2015-08-08 ENCOUNTER — Inpatient Hospital Stay (HOSPITAL_COMMUNITY): Payer: Medicare Other

## 2015-08-08 DIAGNOSIS — Z515 Encounter for palliative care: Secondary | ICD-10-CM | POA: Insufficient documentation

## 2015-08-08 LAB — TSH: TSH: 2.924 u[IU]/mL (ref 0.350–4.500)

## 2015-08-08 LAB — COMPREHENSIVE METABOLIC PANEL
ALK PHOS: 97 U/L (ref 38–126)
ALT: 22 U/L (ref 14–54)
ANION GAP: 8 (ref 5–15)
AST: 26 U/L (ref 15–41)
Albumin: 2.9 g/dL — ABNORMAL LOW (ref 3.5–5.0)
BUN: 15 mg/dL (ref 6–20)
CALCIUM: 10.2 mg/dL (ref 8.9–10.3)
CO2: 33 mmol/L — AB (ref 22–32)
CREATININE: 0.92 mg/dL (ref 0.44–1.00)
Chloride: 98 mmol/L — ABNORMAL LOW (ref 101–111)
GFR, EST NON AFRICAN AMERICAN: 56 mL/min — AB (ref >60)
GLUCOSE: 115 mg/dL — AB (ref 65–99)
Potassium: 2.8 mmol/L — ABNORMAL LOW (ref 3.5–5.1)
SODIUM: 139 mmol/L (ref 135–145)
TOTAL PROTEIN: 5.9 g/dL — AB (ref 6.5–8.1)
Total Bilirubin: 0.6 mg/dL (ref 0.3–1.2)

## 2015-08-08 LAB — CBC WITH DIFFERENTIAL/PLATELET
Basophils Absolute: 0 10*3/uL (ref 0.0–0.1)
Basophils Relative: 0 %
EOS ABS: 0 10*3/uL (ref 0.0–0.7)
EOS PCT: 0 %
HCT: 28.1 % — ABNORMAL LOW (ref 36.0–46.0)
HEMOGLOBIN: 9.2 g/dL — AB (ref 12.0–15.0)
LYMPHS ABS: 1.4 10*3/uL (ref 0.7–4.0)
Lymphocytes Relative: 12 %
MCH: 30.9 pg (ref 26.0–34.0)
MCHC: 32.7 g/dL (ref 30.0–36.0)
MCV: 94.3 fL (ref 78.0–100.0)
MONOS PCT: 9 %
Monocytes Absolute: 1 10*3/uL (ref 0.1–1.0)
Neutro Abs: 9.6 10*3/uL — ABNORMAL HIGH (ref 1.7–7.7)
Neutrophils Relative %: 79 %
PLATELETS: 262 10*3/uL (ref 150–400)
RBC: 2.98 MIL/uL — ABNORMAL LOW (ref 3.87–5.11)
RDW: 13.3 % (ref 11.5–15.5)
WBC: 12 10*3/uL — ABNORMAL HIGH (ref 4.0–10.5)

## 2015-08-08 LAB — PROTIME-INR
INR: 1.28 (ref 0.00–1.49)
Prothrombin Time: 16.1 seconds — ABNORMAL HIGH (ref 11.6–15.2)

## 2015-08-08 LAB — MAGNESIUM: Magnesium: 1.7 mg/dL (ref 1.7–2.4)

## 2015-08-08 LAB — GLUCOSE, CAPILLARY
GLUCOSE-CAPILLARY: 100 mg/dL — AB (ref 65–99)
Glucose-Capillary: 125 mg/dL — ABNORMAL HIGH (ref 65–99)

## 2015-08-08 LAB — URINE CULTURE: Culture: NO GROWTH

## 2015-08-08 LAB — APTT: aPTT: 34 seconds (ref 24–37)

## 2015-08-08 LAB — STREP PNEUMONIAE URINARY ANTIGEN: STREP PNEUMO URINARY ANTIGEN: NEGATIVE

## 2015-08-08 LAB — PHOSPHORUS: Phosphorus: 2.7 mg/dL (ref 2.5–4.6)

## 2015-08-08 MED ORDER — POTASSIUM CHLORIDE 20 MEQ/15ML (10%) PO SOLN
40.0000 meq | Freq: Once | ORAL | Status: AC
  Administered 2015-08-08: 40 meq via ORAL
  Filled 2015-08-08: qty 30

## 2015-08-08 MED ORDER — ADULT MULTIVITAMIN W/MINERALS CH
1.0000 | ORAL_TABLET | Freq: Every day | ORAL | Status: DC
  Administered 2015-08-08 – 2015-08-12 (×5): 1 via ORAL
  Filled 2015-08-08 (×5): qty 1

## 2015-08-08 MED ORDER — ENSURE ENLIVE PO LIQD
237.0000 mL | Freq: Two times a day (BID) | ORAL | Status: DC
  Administered 2015-08-08 – 2015-08-12 (×9): 237 mL via ORAL

## 2015-08-08 MED ORDER — POTASSIUM CHLORIDE 10 MEQ/100ML IV SOLN
10.0000 meq | INTRAVENOUS | Status: AC
  Administered 2015-08-08 (×3): 10 meq via INTRAVENOUS
  Filled 2015-08-08 (×3): qty 100

## 2015-08-08 NOTE — Progress Notes (Signed)
Utilization Review Completed.Linda [name] T1/25/2016  

## 2015-08-08 NOTE — Progress Notes (Signed)
[facility] PROGRESS NOTE  Linda [name] [id number] DOB: October 22, 1928 DOA: 08/07/2015 PCP: [name], MD  Assessment/Plan: 1. Multiple rib fractures with flail chest- patient seen by cardiothoracic surgeon Dr. Ricard Dillon, who has recommended palliative care consultation. Also if patient develops worsening shortness of breath with radiographic signs of enlarging pleural effusion on or pneumothorax she might require chest tube placement, Pleurx catheter placement of needle thoracentesis. Pain is adequately controlled. Continue when necessary Tylenol. 2. Chronic diastolic CHF/bilateral pleural effusion- patient is currently not in respiratory distress, appears euvolemic though chest x-ray shows CHF, continue Lasix 20 daily gram by mouth daily. 3. Lobar pneumonia- patient started on empiric Levaquin considering leukocytosis and compression atelectasis seen on chest x-ray and CT scan. Urinary strep pneumo antigen is negative urine for Legionella is pending, blood cultures negative to date. 4. Essential hypertension- blood pressure controlled, continue metoprolol, Lasix, when necessary hydralazine 5. Paroxysmal atrial fibrillation- rate controlled with metoprolol. CHADS2VASC score is 6. Patient takes Apixaban  at home. Anticoagulation on hold to prevent hemothorax. 6. Hemiparesis/aphasia due to cva- stable 7. Depression-continue Zoloft 8. Hypercalcemia- patient came with calcium of 10.9 which has now improved to 10.2 with IV fluids. 9. Hypokalemia- potassium is 2.8 this morning, will repeat his potassium and check serum magnesium 10. Endplate vertebral fracture- CT abdomen pelvis shows mild acute appearing compression fracture deformity involving the superior end plate of L1 vertebral body which is new compared to previous CT. Continue pain management. Patient does not appear to be in severe pain. 11. DVT prophylaxis- SCDs  Code Status: Full code Family Communication: *Discussed with  patient's caregiver at bedside, tried to call patient's son on phone and left a message Disposition Plan: Pending improvement in the chest pain, goals of care discussion     Consultants:  CVT S  Procedures:  None  Antibiotics:  None:  HPI/Subjective:  81 year old female with past medical history of stroke, non verbal, paroxysmal a fib (on anticoagulation with coumadin), chronic diastolic CHF (last 2 D ECHO in 09/2014 with normal EF), hypertension, last admission early in 07/2015 for hypoxia thought to be due to COPD and CHF. Pt is non verbal at baseline and unable to give any history. Her family is there at the bedside and reports pt had episodes of vomiting and flank pain on the right side. They apparently felt a know on the right side in the flank area. No fevers. No cough. No diarrhea but her family did not know when the bowel movement was. No reports of fall. At baseline, pt able to walk with the walker and assistance but felt really weak over last few days with poor po intake.  CT chest / abdomen demonstrated displaced fractures of the right sixth through tenth posterior-lateral ribs, fractured in more than 1 location indicating flail chest.There is no convincing pneumothorax, perhaps focal trace pneumothorax along the right lateral chest wall. There are bilateral pleural effusions, moderate to large in size, with adjacent compressive atelectasis and they appear stable compared to the previous chest CT of 07/05/2015.  This morning patient denies any chest pain or shortness of breath.  Objective: Filed Vitals:   08/08/15 0010 08/08/15 0435  BP: 154/67 157/69  Pulse: 59 61  Temp: 97.5 F (36.4 C) 98.6 F (37 C)  Resp: 15 28    Intake/Output Summary (Last 24 hours) at 08/08/15 0929 Last data filed at 08/08/15 0500  Gross per 24 hour  Intake 1005.17 ml  Output      0  ml  Net 1005.17 ml   Filed Weights   08/07/15 1937 08/08/15 0500  Weight: 44.6 kg (98 lb 5.2 oz) 44.5 kg  (98 lb 1.7 oz)    Exam:   General:  Appears in no acute distress, lying comfortably  Cardiovascular: S1-S2 regular  Respiratory: Clear to auscultation bilaterally  Abdomen: Soft, nontender, no organomegaly  Musculoskeletal: No cyanosis/clubbing/edema of the lower extremities   Data Reviewed: Basic Metabolic Panel:  Recent Labs Lab 08/07/15 0945 08/08/15 0517  NA 140 139  K 2.9* 2.8*  CL 98* 98*  CO2 33* 33*  GLUCOSE 147* 115*  BUN 25* 15  CREATININE 1.06* 0.92  CALCIUM 10.9* 10.2  MG  --  1.7  PHOS  --  2.7   Liver Function Tests:  Recent Labs Lab 08/07/15 0945 08/08/15 0517  AST 36 26  ALT 27 22  ALKPHOS 119 97  BILITOT 0.5 0.6  PROT 7.0 5.9*  ALBUMIN 3.5 2.9*    Recent Labs Lab 08/07/15 0945  LIPASE 25   No results for input(s): AMMONIA in the last 168 hours. CBC:  Recent Labs Lab 08/07/15 0945 08/08/15 0517  WBC 14.5* 12.0*  NEUTROABS 11.9* 9.6*  HGB 9.7* 9.2*  HCT 29.9* 28.1*  MCV 96.1 94.3  PLT 306 262   Cardiac Enzymes: No results for input(s): CKTOTAL, CKMB, CKMBINDEX, TROPONINI in the last 168 hours. BNP (last 3 results)  Recent Labs  07/06/15 0442  BNP 220.4*    ProBNP (last 3 results) No results for input(s): PROBNP in the last 8760 hours.  CBG:  Recent Labs Lab 08/07/15 0941 08/08/15 0752  GLUCAP 136* 100*    Recent Results (from the past 240 hour(s))  MRSA PCR Screening     Status: None   Collection Time: 08/07/15  7:35 PM  Result Value Ref Range Status   MRSA by PCR NEGATIVE NEGATIVE Final    Comment:        The GeneXpert MRSA Assay (FDA approved for NASAL specimens only), is one component of a comprehensive MRSA colonization surveillance program. It is not intended to diagnose MRSA infection nor to guide or monitor treatment for MRSA infections.      Studies: Ct Chest W Contrast  08/07/2015  CLINICAL DATA:  Right-sided pain with episode of emesis yesterday. Elevated white count. EXAM: CT CHEST,  ABDOMEN, AND PELVIS WITH CONTRAST TECHNIQUE: Multidetector CT imaging of the chest, abdomen and pelvis was performed following the standard protocol during bolus administration of intravenous contrast. CONTRAST:  31mL OMNIPAQUE IOHEXOL 300 MG/ML SOLN, 73mL OMNIPAQUE IOHEXOL 300 MG/ML SOLN COMPARISON:  CT chest dated 07/05/2015 and CT abdomen and pelvis dated 03/10/2015 FINDINGS: CT CHEST FINDINGS Mediastinum/Lymph Nodes: Scattered mild atherosclerotic changes again noted along the walls of the normal-caliber thoracic aorta. No aortic aneurysm or dissection. Heart size is normal. No pericardial effusion. No mass or enlarged lymph nodes seen within the mediastinum or perihilar regions. Lungs/Pleura: Bilateral pleural effusions, moderate to large in size, with adjacent compressive atelectasis. No convincing pneumothorax, perhaps trace pneumothorax along the right lateral chest wall, image 27 of series 2. Musculoskeletal: There are displaced fractures of the right sixth through tenth posterior-lateral ribs. The right seventh through ninth ribs are fractured in more than 1 location compatible with flail chest. The right ninth posterior rib fracture is severely comminuted and displaced. The right posterior eighth rib fracture is also significantly displaced, with greater than 1 cm diastases. Patient has chronic compression fracture deformities within the mid to lower thoracic  spine status post treatment with vertebroplasty. There is a new mild compression fracture deformity involving the superior endplate of the L1 vertebral body. CT ABDOMEN PELVIS FINDINGS Hepatobiliary: No masses or other significant abnormality. Status post cholecystectomy. Pancreas: No mass, inflammatory changes, or other significant abnormality. Spleen: Within normal limits in size and appearance. Adrenals/Urinary Tract: No masses identified. No evidence of hydronephrosis. Bladder mildly distended but otherwise unremarkable. Stomach/Bowel: Scattered  diverticulosis noted within the sigmoid colon without evidence of acute diverticulitis. Transverse colon and right colon mildly distended with gas and stool. Bowel otherwise normal in caliber and configuration throughout. No evidence of bowel wall thickening or bowel wall inflammation. Vascular/Lymphatic: Scattered atherosclerotic changes of the normal-caliber abdominal aorta. No acute- appearing vascular abnormality identified. No enlarged lymph nodes seen within the abdomen or pelvis. Reproductive: No mass or other significant abnormality. Other: No free fluid or hemorrhage identified in the abdomen or pelvis. No free intraperitoneal air. Musculoskeletal: Mild compression fracture deformity of the L1 vertebral body which appears acute. Degenerative changes throughout the lumbar spine, at least moderate in degree. Status post previous right hip arthroplasty. Superficial soft tissues are unremarkable. IMPRESSION: 1. Displaced fractures of the right sixth through tenth posterior-lateral ribs. Right seventh through ninth ribs are fractured in more than 1 location indicating flail chest. The right ninth posterior rib fracture is severely comminuted and displaced. The right posterior eighth rib fracture is also significantly displaced, with greater than 1 cm displacement and over-riding of the main fracture fragments. 2. No convincing pneumothorax, perhaps focal trace pneumothorax along the right lateral chest wall. Small amount of associated soft tissue air about the rib fracture sites. 3. Bilateral pleural effusions, moderate to large in size, with adjacent compressive atelectasis. These pleural effusions appear stable compared to the previous chest CT of 07/05/2015. 4. No acute mediastinal abnormality. 5. Mild acute-appearing compression fracture deformity involving the superior endplate of the L1 vertebral body, new compared to previous CTs, presumably fairly acute. Additional chronic compression fracture  deformities within the thoracic spine. 6. No evidence of acute intra-abdominal or intrapelvic abnormality. Fairly large amount of stool and gas within the colon (constipation? ). These results were called by telephone at the time of interpretation on 08/07/2015 at 11:57 am to Dr. Shirlyn Goltz , who verbally acknowledged these results. Electronically Signed   By: Franki Cabot M.D.   On: 08/07/2015 11:58   Ct Abdomen Pelvis W Contrast  08/07/2015  CLINICAL DATA:  Right-sided pain with episode of emesis yesterday. Elevated white count. EXAM: CT CHEST, ABDOMEN, AND PELVIS WITH CONTRAST TECHNIQUE: Multidetector CT imaging of the chest, abdomen and pelvis was performed following the standard protocol during bolus administration of intravenous contrast. CONTRAST:  42mL OMNIPAQUE IOHEXOL 300 MG/ML SOLN, 62mL OMNIPAQUE IOHEXOL 300 MG/ML SOLN COMPARISON:  CT chest dated 07/05/2015 and CT abdomen and pelvis dated 03/10/2015 FINDINGS: CT CHEST FINDINGS Mediastinum/Lymph Nodes: Scattered mild atherosclerotic changes again noted along the walls of the normal-caliber thoracic aorta. No aortic aneurysm or dissection. Heart size is normal. No pericardial effusion. No mass or enlarged lymph nodes seen within the mediastinum or perihilar regions. Lungs/Pleura: Bilateral pleural effusions, moderate to large in size, with adjacent compressive atelectasis. No convincing pneumothorax, perhaps trace pneumothorax along the right lateral chest wall, image 27 of series 2. Musculoskeletal: There are displaced fractures of the right sixth through tenth posterior-lateral ribs. The right seventh through ninth ribs are fractured in more than 1 location compatible with flail chest. The right ninth posterior rib fracture is severely  comminuted and displaced. The right posterior eighth rib fracture is also significantly displaced, with greater than 1 cm diastases. Patient has chronic compression fracture deformities within the mid to lower thoracic  spine status post treatment with vertebroplasty. There is a new mild compression fracture deformity involving the superior endplate of the L1 vertebral body. CT ABDOMEN PELVIS FINDINGS Hepatobiliary: No masses or other significant abnormality. Status post cholecystectomy. Pancreas: No mass, inflammatory changes, or other significant abnormality. Spleen: Within normal limits in size and appearance. Adrenals/Urinary Tract: No masses identified. No evidence of hydronephrosis. Bladder mildly distended but otherwise unremarkable. Stomach/Bowel: Scattered diverticulosis noted within the sigmoid colon without evidence of acute diverticulitis. Transverse colon and right colon mildly distended with gas and stool. Bowel otherwise normal in caliber and configuration throughout. No evidence of bowel wall thickening or bowel wall inflammation. Vascular/Lymphatic: Scattered atherosclerotic changes of the normal-caliber abdominal aorta. No acute- appearing vascular abnormality identified. No enlarged lymph nodes seen within the abdomen or pelvis. Reproductive: No mass or other significant abnormality. Other: No free fluid or hemorrhage identified in the abdomen or pelvis. No free intraperitoneal air. Musculoskeletal: Mild compression fracture deformity of the L1 vertebral body which appears acute. Degenerative changes throughout the lumbar spine, at least moderate in degree. Status post previous right hip arthroplasty. Superficial soft tissues are unremarkable. IMPRESSION: 1. Displaced fractures of the right sixth through tenth posterior-lateral ribs. Right seventh through ninth ribs are fractured in more than 1 location indicating flail chest. The right ninth posterior rib fracture is severely comminuted and displaced. The right posterior eighth rib fracture is also significantly displaced, with greater than 1 cm displacement and over-riding of the main fracture fragments. 2. No convincing pneumothorax, perhaps focal trace  pneumothorax along the right lateral chest wall. Small amount of associated soft tissue air about the rib fracture sites. 3. Bilateral pleural effusions, moderate to large in size, with adjacent compressive atelectasis. These pleural effusions appear stable compared to the previous chest CT of 07/05/2015. 4. No acute mediastinal abnormality. 5. Mild acute-appearing compression fracture deformity involving the superior endplate of the L1 vertebral body, new compared to previous CTs, presumably fairly acute. Additional chronic compression fracture deformities within the thoracic spine. 6. No evidence of acute intra-abdominal or intrapelvic abnormality. Fairly large amount of stool and gas within the colon (constipation? ). These results were called by telephone at the time of interpretation on 08/07/2015 at 11:57 am to Dr. Shirlyn Goltz , who verbally acknowledged these results. Electronically Signed   By: Franki Cabot M.D.   On: 08/07/2015 11:58   Dg Chest Port 1 View  08/08/2015  CLINICAL DATA:  Right-sided rib fractures, flail chest, shortness of breath, CHF previous CVA EXAM: PORTABLE CHEST 1 VIEW COMPARISON:  Portable chest x-ray of August 07, 2015 FINDINGS: The right-sided pleural effusion has increased and is moderate in size. The small left pleural effusion is stable. The retrocardiac region on the left remains dense. The heart is normal in size. The pulmonary vascularity is indistinct but stable. There is no pneumothorax. Fractures of the lateral aspects of the right sixth through ninth ribs are demonstrated. IMPRESSION: Increased pleural fluid on the right likely hemothorax. Small left pleural effusion. Stable pulmonary vascular congestion with mild interstitial edema consistent with CHF. Left lower lobe atelectasis. Electronically Signed   By: David  Martinique M.D.   On: 08/08/2015 07:02   Dg Chest Port 1 View  08/07/2015  CLINICAL DATA:  Altered mental status and shortness of breath today, vomiting  yesterday  per nurse's note. EXAM: PORTABLE CHEST 1 VIEW COMPARISON:  Chest x-ray dated 08/05/2015. FINDINGS: Mild cardiomegaly is unchanged. There is new central pulmonary vascular congestion and bilateral interstitial edema. Moderate sized layering pleural effusion on the left appears grossly stable, with probable adjacent atelectasis. There are acute displaced fractures of the lateral right sixth through eighth ribs, new compared to plain film of 08/05/2015 and chest CT 07/05/2015, with additional questionable slightly displaced fracture of the right posterior fifth rib. No left-sided rib fracture seen. IMPRESSION: 1. New displaced fractures of the right lateral sixth through eighth ribs, with questionable additional fracture of the posterior right fifth rib. 2. Cardiomegaly with central pulmonary vascular congestion and new bilateral interstitial edema suggesting volume overload/CHF. More confluent opacities at the right lung base suggest either confluent pulmonary edema or lung contusion (given the overlying rib fractures). 3. Moderate sized left pleural effusion, with probable associated atelectasis, appears stable. These results were called by telephone at the time of interpretation on 08/07/2015 at 10:31 am to Dr. Shirlyn Goltz , who verbally acknowledged these results. Electronically Signed   By: Franki Cabot M.D.   On: 08/07/2015 10:33    Scheduled Meds: . cholecalciferol  1,000 Units Oral BID  . furosemide  20 mg Oral Daily  . levofloxacin (LEVAQUIN) IV  750 mg Intravenous Q48H  . levothyroxine  75 mcg Oral QAC breakfast  . metoprolol tartrate  25 mg Oral BID  . multivitamin  2.5 mL Oral Q lunch  . pantoprazole  40 mg Oral Daily  . sertraline  50 mg Oral Daily  . 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 If 7PM-7AM, please contact night-coverage at [email or web address], password [facility] 08/08/2015, 9:29 AM  LOS: 1 day

## 2015-08-08 NOTE — Progress Notes (Signed)
Initial Nutrition Assessment  DOCUMENTATION CODES:   Severe malnutrition in context of chronic illness, Underweight  INTERVENTION:   Ensure Enlive po BID, each supplement provides 350 kcal and 20 grams of protein  NUTRITION DIAGNOSIS:   Malnutrition related to chronic illness as evidenced by severe depletion of body fat, severe depletion of muscle mass   GOAL:   Patient will meet greater than or equal to 90% of their needs  MONITOR:   PO intake, Supplement acceptance, Labs, Weight trends, I & O's  REASON FOR ASSESSMENT:   Consult Assessment of nutrition requirement/status  ASSESSMENT:   81 yo with PMH of stroke, CHF, HTN, last admission early in 07/2015 for hypoxia thought to be due to COPD and CHF. Her family is there at the bedside and reports pt had episodes of vomiting and flank pain on the right side. They apparently felt a know on the right side in the flank area. No fevers. No diarrhea but her family did not know when the bowel movement was. No reports of fall. At baseline, pt able to walk with the walker and assistance but felt really weak over last few days with poor po intake.   Patient is non-verbal and unable to provide history.  Pt known to Clinical Nutrition during previous admission.  + hx of malnutrition which is ongoing.  PO intake 75% per flowsheet records.  Has taken Ensure oral nutrition supplements in the past; RD to order.  Palliative Care Team following.  Nutrition-Focused physical exam completed. Findings are severe fat depletion, severe muscle depletion, and no edema.   Diet Order:  Diet regular Room service appropriate?: Yes; Fluid consistency:: Thin  Skin:  Reviewed, no issues  Last BM:  12/2  Height:   Ht Readings from Last 1 Encounters:  08/07/15 5\' 3"  (1.6 m)    Weight:   Wt Readings from Last 1 Encounters:  08/08/15 98 lb 1.7 oz (44.5 kg)    Ideal Body Weight:  52.2 kg  BMI:  Body mass index is 17.38 kg/(m^2).  Estimated  Nutritional Needs:   Kcal:  1200-1400  Protein:  60-70 gm  Fluid:  >/= 1.5 L  EDUCATION NEEDS:   No education needs identified at this time  Arthur Holms, RD, LDN Pager #: [phone] After-Hours Pager #: [phone]

## 2015-08-08 NOTE — Progress Notes (Signed)
      [address]       [place] [address]             [phone]     CARDIOTHORACIC SURGERY PROGRESS NOTE  Subjective: More alert today.  Cheerful.  Denies SOB.  Comfortable.  Patient's sons are not currently at bedside.  Objective: Vital signs in last 24 hours: Temp:  [97.5 F (36.4 C)-99.4 F (37.4 C)] 99.4 F (37.4 C) (12/05 1700) Pulse Rate:  [55-67] 67 (12/05 1700) Cardiac Rhythm:  [-] Normal sinus rhythm (12/05 0800) Resp:  [15-33] 17 (12/05 1700) BP: (145-168)/(62-69) 168/68 mmHg (12/05 1700) SpO2:  [93 %-100 %] 100 % (12/05 1700) Weight:  [44.5 kg (98 lb 1.7 oz)-44.6 kg (98 lb 5.2 oz)] 44.5 kg (98 lb 1.7 oz) (12/05 0500)  Physical Exam:  Rhythm:   sinus  Breath sounds: Diminished at bases  Heart sounds:  RRR  Incisions:  n/a  Abdomen:  soft  Extremities:  warm   Intake/Output from previous day: 12/04 0701 - 12/05 0700 In: 1005.2 [P.O.:118; I.V.:37.2; IV Piggyback:850] Out: -  Intake/Output this shift: Total I/O In: 320 [P.O.:120; IV Piggyback:200] Out: -   Lab Results:  Recent Labs  08/07/15 0945 08/08/15 0517  WBC 14.5* 12.0*  HGB 9.7* 9.2*  HCT 29.9* 28.1*  PLT 306 262   BMET:  Recent Labs  08/07/15 0945 08/08/15 0517  NA 140 139  K 2.9* 2.8*  CL 98* 98*  CO2 33* 33*  GLUCOSE 147* 115*  BUN 25* 15  CREATININE 1.06* 0.92  CALCIUM 10.9* 10.2    CBG (last 3)   Recent Labs  08/07/15 0941 08/08/15 0752 08/08/15 1220  GLUCAP 136* 100* 125*   PT/INR:   Recent Labs  08/08/15 0517  LABPROT 16.1*  INR 1.28    CXR:  PORTABLE CHEST 1 VIEW  COMPARISON: Portable chest x-ray of August 07, 2015  FINDINGS: The right-sided pleural effusion has increased and is moderate in size. The small left pleural effusion is stable. The retrocardiac region on the left remains dense. The heart is normal in size. The pulmonary vascularity is indistinct but stable. There is no pneumothorax. Fractures of the lateral aspects  of the right sixth through ninth ribs are demonstrated.  IMPRESSION: Increased pleural fluid on the right likely hemothorax. Small left pleural effusion. Stable pulmonary vascular congestion with mild interstitial edema consistent with CHF. Left lower lobe atelectasis.   Electronically Signed  By: David Martinique M.D.  On: 08/08/2015 07:02  Assessment/Plan:  Clinically stable.  Appreciate input from Dr Rowe Pavy and [place] Team.  Will continue to follow.  Check repeat CXR in am.  I spent in excess of 15 minutes during the conduct of this hospital encounter and >50% of this time involved direct face-to-face encounter with the patient for counseling and/or coordination of their care.   Rexene Alberts, MD 08/08/2015 5:37 PM

## 2015-08-08 NOTE — Progress Notes (Addendum)
Consult request received, chart reviewed, patient seen and examined. Discussed with caregiver Marissa by the bedside in detail, she takes care of the patient Monday-Friday at home.  Call placed several times and message left for HCPOA son Austyn Haire at [id number] and [id number]. Unable to establish contact with son Jenny Reichmann.  Full note will follow once contact with Jenny Reichmann established and family meeting is scheduled. Agree that a more comfort/palliative based approach to her care is medically appropriate, this includes establishing DNR DNI, will discuss in detail with son.  Thank you for consult Loistine Chance, MD [facility] palliative medicine team [phone]  Addendum: Received call back from son Unika Holdcroft HCPOA agent for Ms Orecchio: Discussed briefly about the patient's acute medical conditions complicating this hospitalization. Specifically, DNR DNI discussed in detail.  PLAN: Family meeting with son Otavia Montalban on 08-09-15 at 1600, after Jenny Reichmann gets out of work Full note, assessment and recommendations will follow.

## 2015-08-08 NOTE — Evaluation (Signed)
Physical Therapy Evaluation Patient Details Name: Linda [name] MRN: [id number] DOB: August 11, 1929 Today's Date: 08/08/2015   History of Present Illness  Patient is an 81 year old female with hypertension and chronic diastolic congestive heart failure who suffered an embolic stroke in the left MCA distribution on 12/25/2014. She was noted to be in atrial fibrillation at the time. She developed right hemiparesis and severe expressive aphasia. She has been chronically anticoagulated ever since using Eliquis for paroxysmal atrial fibrillation. She has been hospitalized numerous times since then and according to her son she has suffered 2 or 3 additional "mini strokes". For the most part she has been living at home with one of her 3 sons who cares for her. In September she suffered a fall that resulted in a closed right hip fracture requiring right total hip arthroplasty. She was discharged from the hospital to [facility] for rehabilitation but eventually returned home again where she lives with her son. He has arranged for in-home care to assist with her management while he is at work. Shortly after she returned home from the nursing home she was hospitalized for several days in early November because of profound weakness. She was noted to have bilateral pleural effusions, left greater than right and severe protein depleted malnutrition complicating underlying severe weakness, limited mobility, and chronic diastolic congestive heart failure.   Clinical Impression  Pt admitted with above diagnosis. Pt currently with functional limitations due to the deficits listed below (see PT Problem List). Pt was able to stand and transfer with +2 mod to max assist.  Has a significant posterior lean which caregiver Luna Fuse that was present stated was her baseline status.  Should be able to go home with [facility] and 24 hour caregivers at d/c.   Pt will benefit from skilled PT to increase their independence and safety with  mobility to allow discharge to the venue listed below.      Follow Up Recommendations Home health PT;Supervision/Assistance - 24 hour    Equipment Recommendations  None recommended by PT    Recommendations for Other Services       Precautions / Restrictions Precautions Precautions: Fall;Anterior Hip Restrictions Weight Bearing Restrictions: No      Mobility  Bed Mobility Overal bed mobility: Needs Assistance;+2 for physical assistance Bed Mobility: Supine to Sit     Supine to sit: Mod assist;+2 for physical assistance     General bed mobility comments: Needed assist for LEs and for elevation of trunk.  Transfers Overall transfer level: Needs assistance Equipment used: Rolling walker (2 wheeled) Transfers: Sit to/from [facility] Sit to Stand: Mod assist;Max assist;+2 physical assistance;From elevated surface Stand pivot transfers: Mod assist;+2 physical assistance;Max assist       General transfer comment: Pt initially came to standing with mod assist and cues.  Pt was having a BM therefore caregiver grabbed the 3N1 forthe pt and brought it beside bed.  Pt took a few pivotal steps toward 3N1 with progressively worsening posterior lean as she was stepping.  PT cued pt maximally but pt kept increasing her posterior lean to where PT and tech had to perform max assist to sit on 3N1.  Cleaned pt with total assist.  Again, once pt began stepping, pt with progressively increasing her posterior lean again needing max assist and assist to control descent into the recliner.  Needed assist to move pt back in chair using pad as she could not follow commands to scoot back.   Ambulation/Gait  Stairs            Wheelchair Mobility    Modified Rankin (Stroke Patients Only)       Balance Overall balance assessment: Needs assistance;History of Falls Sitting-balance support: Bilateral upper extremity supported;Feet supported Sitting  balance-Leahy Scale: Poor Sitting balance - Comments: Pt able to sit EOB with min to mod assist and cues due to posterior lean  Postural control: Posterior lean Standing balance support: Bilateral upper extremity supported;During functional activity Standing balance-Leahy Scale: Zero Standing balance comment: Pt needing max assist most of time due to posterior lean.                              Pertinent Vitals/Pain Pain Assessment: No/denies pain  O2 on 2LO2 at rest with sats 87-88% once in chair.  Incr to 3LO2 to keep sats >90%.  Left pt on 3LO2 as O2 sats would not stay above 90% on 2LO2 once in chair.  Other VSS.      Home Living Family/patient expects to be discharged to:: Private residence Living Arrangements: Children Available Help at Discharge: Personal care attendant;Available 24 hours/day;Family Type of Home: House Home Access: Stairs to enter Entrance Stairs-Rails: Chemical engineer of Steps: 7 Home Layout: One level;Laundry or work area in [place]: [name] - single point;Walker - 2 wheels;Bedside commode;Shower seat (gait belt) Additional Comments: chart indicates family getting hospital bed as well    Prior Function Level of Independence: Needs assistance   Gait / Transfers Assistance Needed: assist with RW, backward walking/transfer assist by personal care attendant     ADL's / Homemaking Assistance Needed: assist by personal care attendant   Comments: pt needs a lot of cues for safety with significant posterior lean which caregiver sounds like she has been assisting as such PTA.       Hand Dominance   Dominant Hand: Right    Extremity/Trunk Assessment   Upper Extremity Assessment: Defer to OT evaluation           Lower Extremity Assessment: RLE deficits/detail RLE Deficits / Details: grossly 3-/5    Cervical / Trunk Assessment: Kyphotic  Communication   Communication: Expressive difficulties  Cognition  Arousal/Alertness: Awake/alert Behavior During Therapy: Flat affect;Anxious Overall Cognitive Status: History of cognitive impairments - at baseline                      General Comments General comments (skin integrity, edema, etc.): Pt caregiver present and states that pt had posterior lean PTA. She says they have a system and pt and her work well together.      Exercises        Assessment/Plan    PT Assessment Patient needs continued PT services  PT Diagnosis Generalized weakness   PT Problem List Decreased activity tolerance;Decreased balance;Decreased mobility;Decreased knowledge of use of DME;Decreased safety awareness;Decreased knowledge of precautions;Decreased strength;Decreased range of motion;Decreased cognition  PT Treatment Interventions DME instruction;Gait training;Functional mobility training;Therapeutic activities;Therapeutic exercise;Balance training;Patient/family education   PT Goals (Current goals can be found in the Care Plan section) Acute Rehab PT Goals Patient Stated Goal: to go home with caregivers and son PT Goal Formulation: With patient Time For Goal Achievement: 08/22/15 Potential to Achieve Goals: Fair    Frequency Min 3X/week   Barriers to discharge        Co-evaluation               End of  Session Equipment Utilized During Treatment: Gait belt;Oxygen Activity Tolerance: Patient limited by fatigue;Patient limited by pain Patient left: in chair;with call bell/phone within reach;with family/visitor present Nurse Communication: Mobility status;Need for lift equipment         Time: 1132-1156 PT Time Calculation (min) (ACUTE ONLY): 24 min   Charges:   PT Evaluation $Initial PT Evaluation Tier I: 1 Procedure PT Treatments $Therapeutic Activity: 8-22 mins   PT G CodesDenice Paradise 08/23/2015, 3:47 PM Dawn White,PT Acute Rehabilitation [phone] (pager)

## 2015-08-09 ENCOUNTER — Inpatient Hospital Stay (HOSPITAL_COMMUNITY): Payer: Medicare Other

## 2015-08-09 DIAGNOSIS — J90 Pleural effusion, not elsewhere classified: Secondary | ICD-10-CM

## 2015-08-09 DIAGNOSIS — Z7189 Other specified counseling: Secondary | ICD-10-CM | POA: Insufficient documentation

## 2015-08-09 DIAGNOSIS — S2241XA Multiple fractures of ribs, right side, initial encounter for closed fracture: Secondary | ICD-10-CM

## 2015-08-09 LAB — GLUCOSE, CAPILLARY: GLUCOSE-CAPILLARY: 108 mg/dL — AB (ref 65–99)

## 2015-08-09 LAB — COMPREHENSIVE METABOLIC PANEL
ALBUMIN: 2.7 g/dL — AB (ref 3.5–5.0)
ALT: 21 U/L (ref 14–54)
AST: 28 U/L (ref 15–41)
Alkaline Phosphatase: 99 U/L (ref 38–126)
Anion gap: 5 (ref 5–15)
BILIRUBIN TOTAL: 0.4 mg/dL (ref 0.3–1.2)
BUN: 13 mg/dL (ref 6–20)
CO2: 31 mmol/L (ref 22–32)
Calcium: 9.9 mg/dL (ref 8.9–10.3)
Chloride: 100 mmol/L — ABNORMAL LOW (ref 101–111)
Creatinine, Ser: 0.96 mg/dL (ref 0.44–1.00)
GFR calc Af Amer: >60 mL/min (ref >60)
GFR calc non Af Amer: 53 mL/min — ABNORMAL LOW (ref >60)
GLUCOSE: 119 mg/dL — AB (ref 65–99)
POTASSIUM: 3.7 mmol/L (ref 3.5–5.1)
SODIUM: 136 mmol/L (ref 135–145)
TOTAL PROTEIN: 5.5 g/dL — AB (ref 6.5–8.1)

## 2015-08-09 LAB — MAGNESIUM: MAGNESIUM: 1.7 mg/dL (ref 1.7–2.4)

## 2015-08-09 MED ORDER — MAGNESIUM SULFATE BOLUS VIA INFUSION
2.0000 g | Freq: Once | INTRAVENOUS | Status: DC
Start: 2015-08-09 — End: 2015-08-09
  Filled 2015-08-09 (×2): qty 500

## 2015-08-09 MED ORDER — MAGNESIUM SULFATE 2 GM/50ML IV SOLN
2.0000 g | Freq: Once | INTRAVENOUS | Status: AC
  Administered 2015-08-09: 2 g via INTRAVENOUS
  Filled 2015-08-09: qty 50

## 2015-08-09 NOTE — Progress Notes (Signed)
[address]       Linda [name],Linda [name] [address]             [phone]     CARDIOTHORACIC SURGERY PROGRESS NOTE  Subjective: Looks comfortable.  Breaths are shallow but patient denies ongoing pain or SOB  Objective: Vital signs in last 24 hours: Temp:  [98.1 F (36.7 C)-99.2 F (37.3 C)] 99.1 F (37.3 C) (12/06 1637) Pulse Rate:  [59-80] 77 (12/06 1637) Cardiac Rhythm:  [-] Normal sinus rhythm (12/06 1052) Resp:  [16-29] 29 (12/06 1637) BP: (118-167)/(63-82) 118/67 mmHg (12/06 1637) SpO2:  [89 %-98 %] 95 % (12/06 1637) Weight:  [46 kg (101 lb 6.6 oz)] 46 kg (101 lb 6.6 oz) (12/06 0500)  Physical Exam:  Rhythm:   sinus  Breath sounds: Diminished both bases  Heart sounds:  RRR  Incisions:  n/a  Abdomen:  soft  Extremities:  warm   Intake/Output from previous day: 12/05 0701 - 12/06 0700 In: 920 [P.O.:720; IV Piggyback:200] Out: -  Intake/Output this shift: Total I/O In: 460 [P.O.:460] Out: -   Lab Results:  Recent Labs  08/07/15 0945 08/08/15 0517  WBC 14.5* 12.0*  HGB 9.7* 9.2*  HCT 29.9* 28.1*  PLT 306 262   BMET:  Recent Labs  08/08/15 0517 08/09/15 0305  NA 139 136  K 2.8* 3.7  CL 98* 100*  CO2 33* 31  GLUCOSE 115* 119*  BUN 15 13  CREATININE 0.92 0.96  CALCIUM 10.2 9.9    CBG (last 3)   Recent Labs  08/08/15 0752 08/08/15 1220 08/09/15 0808  GLUCAP 100* 125* 108*   PT/INR:   Recent Labs  08/08/15 0517  LABPROT 16.1*  INR 1.28    CXR:  PORTABLE CHEST 1 VIEW  COMPARISON: Portable chest x-ray of August 08, 2015  FINDINGS: The pleural effusion -hemo thorax on the right has increased and is moderate in size. Multiple fractures of the posterior lateral aspects of the right fifth through ninth ribs are again demonstrated. There is no pneumothorax. There is stable left lower lobe atelectasis and small pleural effusion. The cardiac silhouette is top-normal in size. The central pulmonary vascularity  is prominent. The bony structures other than the right lateral ribs exhibit no acute abnormalities.  IMPRESSION: Slight interval increase in the volume of the right-sided pleural effusion-hemothorax. Stable left basilar atelectasis and small pleural effusion. There is mild central pulmonary vascular congestion.   Electronically Signed  By: David Martinique M.D.  On: 08/09/2015 07:11  Assessment/Plan:  Right pleural effusion continues to enlarge.  The patient has had chronic bilateral effusions related to CHF, but the increased size of the right effusion is undoubtedly related to her recent rib fractures.  I discussed options for management via telephone with the patient's son and medical POA, Eiko Cretella.  We discussed the potential short and long term palliative benefits associated with drainage of her pleural effusions.  Chest tube placement would likely be the most effective means but come with significant discomfort.  The radiographic appearance of her effusions suggest that thoracentesis might be effective, and there's little harm associated with attempting thoracentesis first.  He prefers this approach.  Will ask Radiology to proceed with U/S guided right thoracentesis first, followed by left thoracentesis.   All questions answered.  I spent in excess of 15 minutes during the conduct of this hospital encounter and >50% of this time involved direct face-to-face encounter with the patient for counseling and/or coordination of  their care.   Rexene Alberts, MD 08/09/2015 5:51 PM

## 2015-08-09 NOTE — Consult Note (Signed)
Consultation Note Date: 08/09/2015   Patient Name: Linda [name]  DOB: 11-19-1928  MRN: [id number]  Age / Sex: 81 y.o., female  PCP: Deland Pretty, MD Referring Physician: Oswald Hillock, MD  Reason for Consultation: Establishing goals of care    Clinical Assessment/Narrative:  Patient is an 81 year old lady with a past medical history significant for HTN, diastolic CHF, she had a stroke in April, 2016. She has A fib. Patient lives at home with assistance from round the clock caregivers. She has 3 sons, eldest son Arta Bruce is noted to be the HCPOA agent.   Patient was brought into the hospital, she has been diagnosed with displaced fractures R 6-10 ribs, lobar PNA, flail chest. She has been seen by CT surgery, it has been recommended that the patient ought to consider comfort measures, thoracentesis and drainage of pleural fluid as a palliative measure if there are uncontrolled symptoms.   A palliative consult has been placed for code status and goals of care discussions. Discussed with patient's son HCPOA agent Legrand Como over the phone on 08-08-15. Family meeting was set up for 1600 on 08-09-15 Patient seen and examined. Elderly lady frail resting in bed does not appear to be in any acute distress. Answers a few yes/no type questions appropriately. Denies chest discomfort denies dyspnea, does not appear to be using any accessory muscles of respiration.  Patient lives at home and is cared for at home by round-the-clock caregivers. Primary caregiver Mable Fill is present at the bedside.  Family meeting: Met with patient's son healthcare power of attorney agent Legrand Como. Discussed the patient's multiple rib fractures with flail chest. Discussed recommendations put forth by cardiothoracic surgeon. Discussed that repeat imaging with chest x-ray today shows increased size of right-sided effusion possibly hemopneumothorax. Scope of  palliative services introduced. Described palliative care as a mode of care for appropriate pain and non-pain symptom management, introduction of comfort measures. Discussed about adding hospice as an extra layer of support at home. Patient wishes were to not lay in bed and linger like a vegetable per her son Legrand Como.  We'll request case management consultation for making arrangements for home with hospice. Continue to observe patient's for any symptoms. CODE STATUS is now established as DO NOT RESUSCITATE/DO NOT INTUBATE. All questions answered. Patient's son Legrand Como states that he would agree with their discussions and decision mentioned above.  Contacts/Participants in Discussion: Primary Decision Maker: Legrand Como Relationship to Patient son HCPOA: yes     SUMMARY OF RECOMMENDATIONS: DNR DNI Hospice consult Home with Hospice once arrangements made Continue comfort measures, pain management.    Code Status/Advance Care Planning: DNR    Code Status Orders        Start     Ordered   08/09/15 1646  Do not attempt resuscitation (DNR)   Continuous    Question Answer Comment  In the event of cardiac or respiratory ARREST Do not call a "code blue"   In the event of cardiac or respiratory ARREST Do not perform Intubation, CPR, defibrillation or ACLS   In the event of cardiac or respiratory ARREST Use medication by any route, position, wound care, and other measures to relive pain and suffering. May use oxygen, suction and manual treatment of airway obstruction as needed for comfort.      08/09/15 1646    Advance Directive Documentation        Most Recent Value   Type of Advance Directive  Healthcare Power of Attorney [Son Arta Bruce  [place]]   Pre-existing out of facility DNR order (yellow form or pink MOST form)     "MOST" Form in Place?        Other Directives:Other  Symptom Management:    patient with extensive opioid medication " allergies" listed as nausea and vomiting.  Continue to monitor for pain needs  Palliative Prophylaxis:   Delirium Protocol  Additional Recommendations (Limitations, Scope, Preferences):  Home with hospice once arrangements completed.    Psycho-social/Spiritual:  Support System: Strong Desire for further Chaplaincy support:no Additional Recommendations: Education on Hospice  Prognosis: < 6 months  Discharge Planning: Home with Hospice   Chief Complaint/ Primary Diagnoses: Present on Admission:  . Chronic depression . Esophageal reflux . Essential hypertension . Hypokalemia . Paroxysmal atrial fibrillation (HCC) . Protein-calorie malnutrition, severe ([place]) . Lobar pneumonia, unspecified organism ([place]) . Right flank pain . Other specified hypothyroidism . Leukocytosis . CKD (chronic kidney disease) stage 3, GFR 30-59 ml/min . Anemia of chronic renal failure, stage 3 (moderate) . Hypercalcemia . Chronic diastolic CHF (congestive heart failure) ([place]) . Closed fracture of multiple ribs of right side . Flail chest on right side . Pleural effusion, bilateral . Multiple rib fractures  I have reviewed the medical record, interviewed the patient and family, and examined the patient. The following aspects are pertinent.  Past Medical History  Diagnosis Date  . Closed fracture of multiple ribs of right side 08/07/2015  . Flail chest on right side 08/07/2015  . Pleural effusion, bilateral 07/05/2015   Social History   Social History  . Marital Status: Widowed    Spouse Name: N/A  . Number of Children: 3  . Years of Education: some coll.   Occupational History  . retired     Primary school teacher position   Social History Main Topics  . Smoking status: Never Smoker   . Smokeless tobacco: Never Used  . Alcohol Use: No  . Drug Use: No  . Sexual Activity: No   Other Topics Concern  . None   Social History Narrative   Patient is widowed with 3 children.   Patient is right handed.   Patient has some college  education.   Patient drinks 1-2 cups some days of the week.   Family History  Problem Relation Age of Onset  . Asthma Brother   . Osteoarthritis Son   . Hypertension Father   . Stroke Father   . Cancer Brother     stomach   Scheduled Meds: . cholecalciferol  1,000 Units Oral BID  . feeding supplement (ENSURE ENLIVE)  237 mL Oral BID BM  . furosemide  20 mg Oral Daily  . levofloxacin (LEVAQUIN) IV  750 mg Intravenous Q48H  . levothyroxine  75 mcg Oral QAC breakfast  . magnesium sulfate 1 - 4 g bolus IVPB  2 g Intravenous Once  . metoprolol tartrate  25 mg Oral BID  . multivitamin with minerals  1 tablet Oral Daily  . pantoprazole  40 mg Oral Daily  . sertraline  50 mg Oral Daily  . sodium chloride  3 mL Intravenous Q12H   Continuous Infusions:  PRN Meds:.acetaminophen, hydrALAZINE, ondansetron **OR** ondansetron (ZOFRAN) IV, polyvinyl alcohol Medications Prior to Admission:  Prior to Admission medications   Medication Sig Start Date End Date Taking? Authorizing Provider  acetaminophen (TYLENOL) 500 MG tablet Take 1,000 mg by mouth every 6 (six) hours as needed for mild pain, moderate pain, fever or headache.   Yes Historical Provider, MD  apixaban (ELIQUIS) 2.5 MG TABS tablet Take 1 tablet (2.5 mg total) by mouth 2 (two) times daily. 07/07/15  Yes Oswald Hillock, MD  cholecalciferol (VITAMIN D) 1000 UNITS tablet Take 1,000 Units by mouth 2 (two) times daily.    Yes Historical Provider, MD  ciprofloxacin (CIPRO) 250 MG tablet Take 250 mg by mouth 2 (two) times daily. 07/31/15  Yes Historical Provider, MD  denosumab (PROLIA) 60 MG/ML SOLN injection Inject 60 mg into the skin every 6 (six) months. Administer in upper arm, thigh, or abdomen   Yes Historical Provider, MD  furosemide (LASIX) 20 MG tablet Take 1 tablet (20 mg total) by mouth daily. 07/07/15  Yes Oswald Hillock, MD  levothyroxine (SYNTHROID, LEVOTHROID) 75 MCG tablet Take 75 mcg by mouth daily before breakfast.   Yes Historical  Provider, MD  metoprolol tartrate (LOPRESSOR) 25 MG tablet Take 1 tablet (25 mg total) by mouth 2 (two) times daily. 03/25/15  Yes Ivan Anchors Love, PA-C  Multiple Vitamin (MULTIVITAMIN WITH MINERALS) TABS tablet Take 0.5 tablets by mouth daily with lunch.    Yes Historical Provider, MD  pantoprazole (PROTONIX) 40 MG tablet Take 40 mg by mouth daily.    Yes Historical Provider, MD  Propylene Glycol (SYSTANE BALANCE OP) Place 1 drop into both eyes daily as needed (dry eyes).    Yes Historical Provider, MD  sertraline (ZOLOFT) 50 MG tablet Take 50 mg by mouth daily.  05/10/15  Yes Historical Provider, MD  methocarbamol (ROBAXIN) 500 MG tablet Take 1 tablet (500 mg total) by mouth every 6 (six) hours as needed for muscle spasms. Patient not taking: Reported on 07/05/2015 05/25/15   Nishant Dhungel, MD  tamsulosin (FLOMAX) 0.4 MG CAPS capsule Take 1 capsule (0.4 mg total) by mouth daily. Patient not taking: Reported on 07/05/2015 05/25/15   Louellen Molder, MD   Allergies  Allergen Reactions  . Ibuprofen Anaphylaxis    Passed out and had to be hospitalized  . Lyrica [Pregabalin] Other (See Comments)    "made me a zombie'  . Penicillins Other (See Comments)    Skin on head was crawling and very very sleepy Has patient had a PCN reaction causing immediate rash, facial/tongue/throat swelling, SOB or lightheadedness with hypotension: No  Has patient had a PCN reaction causing severe rash involving mucus membranes or skin necrosis: No Has patient had a PCN reaction that required hospitalization Unknown Has patient had a PCN reaction occurring within the last 10 years: Unknown If all of the above answers are "NO", then may proceed with Cephalosporin use.   . Demerol [Meperidine Hcl] Nausea And Vomiting  . Dilaudid [Hydromorphone Hcl] Nausea And Vomiting  . Morphine And Related Nausea And Vomiting  . Percocet [Oxycodone-Acetaminophen] Nausea And Vomiting  . Vicodin [Hydrocodone-Acetaminophen] Nausea And  Vomiting  . Fentanyl Other (See Comments)    unknown  . Lidoderm [Lidocaine] Other (See Comments)    unknown  . Other Other (See Comments)    hamburger  . Tramadol Other (See Comments)    unknown    Review of Systems Weakness, good PO intake. No distress.   Physical Exam NAD Diminished breathing S1 S2 Abdomen soft  No edema Awake alert, doesn't verbalize much but answers few questions appropriately.   Vital Signs: BP 118/67 mmHg  Pulse 77  Temp(Src) 99.1 F (37.3 C) (Axillary)  Resp 29  Ht '5\' 3"'  (1.6 m)  Wt 46 kg (101 lb 6.6 oz)  BMI 17.97 kg/m2  SpO2 95%  LMP  09/03/1996 (Approximate)  SpO2: SpO2: 95 % O2 Device:SpO2: 95 % O2 Flow Rate: .O2 Flow Rate (L/min): 2 L/min  IO: Intake/output summary:  Intake/Output Summary (Last 24 hours) at 08/09/15 1647 Last data filed at 08/09/15 4854  Gross per 24 hour  Intake    820 ml  Output      0 ml  Net    820 ml    LBM: Last BM Date: 08/08/15 Baseline Weight: Weight: 44.6 kg (98 lb 5.2 oz) Most recent weight: Weight: 46 kg (101 lb 6.6 oz)      Palliative Assessment/Data:  Flowsheet Rows        Most Recent Value   Intake Tab    Referral Department  Hospitalist   Unit at Time of Referral  Cardiac/Telemetry Unit   Palliative Care Primary Diagnosis  Pulmonary   Date Notified  08/07/15   Palliative Care Type  New Palliative care   Reason for referral  Clarify Goals of Care   Date of Admission  08/07/15   Date first seen by Palliative Care  08/09/15   # of days Palliative referral response time  0 Day(s)   # of days IP prior to Palliative referral  0   Clinical Assessment    Palliative Performance Scale Score  30%   Pain Max last 24 hours  4   Pain Min Last 24 hours  3   Psychosocial & Spiritual Assessment    Palliative Care Outcomes       Additional Data Reviewed:  CBC:    Component Value Date/Time   WBC 12.0* 08/08/2015 0517   WBC 9.5 06/15/2015   HGB 9.2* 08/08/2015 0517   HCT 28.1* 08/08/2015 0517    PLT 262 08/08/2015 0517   MCV 94.3 08/08/2015 0517   NEUTROABS 9.6* 08/08/2015 0517   LYMPHSABS 1.4 08/08/2015 0517   MONOABS 1.0 08/08/2015 0517   EOSABS 0.0 08/08/2015 0517   BASOSABS 0.0 08/08/2015 0517   Comprehensive Metabolic Panel:    Component Value Date/Time   NA 136 08/09/2015 0305   NA 139 02/02/2015 1248   K 3.7 08/09/2015 0305   CL 100* 08/09/2015 0305   CO2 31 08/09/2015 0305   BUN 13 08/09/2015 0305   BUN 19 02/02/2015 1248   CREATININE 0.96 08/09/2015 0305   CREATININE 1.0 02/02/2015 1248   GLUCOSE 119* 08/09/2015 0305   CALCIUM 9.9 08/09/2015 0305   CALCIUM 10.6* 07/06/2015 0442   AST 28 08/09/2015 0305   ALT 21 08/09/2015 0305   ALKPHOS 99 08/09/2015 0305   BILITOT 0.4 08/09/2015 0305   PROT 5.5* 08/09/2015 0305   ALBUMIN 2.7* 08/09/2015 0305     Time In: 1600 Time Out 1700 Time Total: 60 Greater than 50%  of this time was spent counseling and coordinating care related to the above assessment and plan.  Signed by: Loistine Chance, MD [id number] Loistine Chance, MD  08/09/2015, 4:47 PM  Please contact Palliative Medicine Team phone at [phone] for questions and concerns.

## 2015-08-09 NOTE — Progress Notes (Signed)
[facility] PROGRESS NOTE  Linda [name] [id number] DOB: [birth year] DOA: 08/07/2015 PCP: [name], MD  Assessment/Plan: 1. Multiple rib fractures with flail chest- patient seen by cardiothoracic surgeon Dr. Ricard Dillon, who has recommended palliative care consultation. Also if patient develops worsening shortness of breath with radiographic signs of enlarging pleural effusion on or pneumothorax she might require chest tube placement, Pleurx catheter placement or needle thoracentesis. Pain is adequately controlled. Continue when necessary Tylenol.  2. Chronic diastolic CHF/bilateral pleural effusion- patient is currently not in respiratory distress, appears euvolemic though chest x-ray shows CHF, continue Lasix 20 milligrams by mouth daily. 3. Lobar pneumonia- patient started on empiric Levaquin considering leukocytosis and compression atelectasis seen on chest x-ray and CT scan. Urinary strep pneumo antigen is negative urine for Legionella is pending, blood cultures negative to date. 4. Essential hypertension- blood pressure controlled, continue metoprolol, Lasix, when necessary hydralazine. 5. Paroxysmal atrial fibrillation- rate controlled with metoprolol. CHADS2VASC score is 6. Patient takes Apixaban  at home. Anticoagulation on hold to prevent hemothorax. 6. Hemiparesis/aphasia due to cva- stable 7. Depression-continue Zoloft 8. Hypercalcemia- patient came with calcium of 10.9 which has now improved to 9.9 with IV fluids. 9. Hypokalemia- replete, today potassium is 3.7 10. Hypomagnesemia- magnesium is 1.7, will give 2 g mag sulfate IV 1 11. Endplate vertebral fracture- CT abdomen pelvis shows mild acute appearing compression fracture deformity involving the superior end plate of L1 vertebral body which is new compared to previous CT. Continue pain management. Patient does not appear to be in severe pain. 12. DVT prophylaxis- SCDs  Code Status: Full code Family  Communication: *Discussed with patient's caregiver at bedside, tried to call patient's son on phone and left a message. Palliative care goals of discussion scheduled for today Disposition Plan: Pending improvement in the chest pain, goals of care discussion     Consultants:  CVT S  Procedures:  None  Antibiotics:  None:  HPI/Subjective:  81 year old female with past medical history of stroke, non verbal, paroxysmal a fib (on anticoagulation with coumadin), chronic diastolic CHF (last 2 D ECHO in 09/2014 with normal EF), hypertension, last admission early in 07/2015 for hypoxia thought to be due to COPD and CHF. Pt is non verbal at baseline and unable to give any history. Her family is there at the bedside and reports pt had episodes of vomiting and flank pain on the right side. They apparently felt a knot  on the right side in the flank area. CT chest / abdomen demonstrated displaced fractures of the right sixth through tenth posterior-lateral ribs, fractured in more than 1 location indicating flail chest.There is no convincing pneumothorax, perhaps focal trace pneumothorax along the right lateral chest wall. There are bilateral pleural effusions, moderate to large in size, with adjacent compressive atelectasis and they appear stable compared to the previous chest CT of 07/05/2015.  Patient was evaluated by CVT S, no surgical intervention recommended at this time. Palliative care consultation obtained. Meeting scheduled for today.  This morning patient resting comfortably, denies any pain or shortness of breath.  Objective: Filed Vitals:   08/09/15 0412 08/09/15 0809  BP: 163/77 159/77  Pulse: 62 69  Temp: 98.9 F (37.2 C) 98.4 F (36.9 C)  Resp: 20 18    Intake/Output Summary (Last 24 hours) at 08/09/15 1002 Last data filed at 08/09/15 [id number]  Gross per 24 hour  Intake   1260 ml  Output      0 ml  Net   1260  ml   Filed Weights   08/07/15 1937 08/08/15 0500 08/09/15 0500   Weight: 44.6 kg (98 lb 5.2 oz) 44.5 kg (98 lb 1.7 oz) 46 kg (101 lb 6.6 oz)    Exam:   General:  Appears in no acute distress, lying comfortably  Cardiovascular: S1-S2 regular  Respiratory: Clear to auscultation bilaterally  Abdomen: Soft, nontender, no organomegaly  Musculoskeletal: No cyanosis/clubbing/edema of the lower extremities   Data Reviewed: Basic Metabolic Panel:  Recent Labs Lab 08/07/15 0945 08/08/15 0517 08/09/15 0305  NA 140 139 136  K 2.9* 2.8* 3.7  CL 98* 98* 100*  CO2 33* 33* 31  GLUCOSE 147* 115* 119*  BUN 25* 15 13  CREATININE 1.06* 0.92 0.96  CALCIUM 10.9* 10.2 9.9  MG  --  1.7 1.7  PHOS  --  2.7  --    Liver Function Tests:  Recent Labs Lab 08/07/15 0945 08/08/15 0517 08/09/15 0305  AST 36 26 28  ALT 27 22 21   ALKPHOS 119 97 99  BILITOT 0.5 0.6 0.4  PROT 7.0 5.9* 5.5*  ALBUMIN 3.5 2.9* 2.7*    Recent Labs Lab 08/07/15 0945  LIPASE 25   No results for input(s): AMMONIA in the last 168 hours. CBC:  Recent Labs Lab 08/07/15 0945 08/08/15 0517  WBC 14.5* 12.0*  NEUTROABS 11.9* 9.6*  HGB 9.7* 9.2*  HCT 29.9* 28.1*  MCV 96.1 94.3  PLT 306 262   Cardiac Enzymes: No results for input(s): CKTOTAL, CKMB, CKMBINDEX, TROPONINI in the last 168 hours. BNP (last 3 results)  Recent Labs  07/06/15 0442  BNP 220.4*    ProBNP (last 3 results) No results for input(s): PROBNP in the last 8760 hours.  CBG:  Recent Labs Lab 08/07/15 0941 08/08/15 0752 08/08/15 1220 08/09/15 0808  GLUCAP 136* 100* 125* 108*    Recent Results (from the past 240 hour(s))  Urine culture     Status: None   Collection Time: 08/07/15  9:50 AM  Result Value Ref Range Status   Specimen Description URINE, CATHETERIZED  Final   Special Requests NONE  Final   Culture   Final    NO GROWTH 1 DAY Performed at [facility]    Report Status 08/08/2015 FINAL  Final  Blood culture (routine x 2)     Status: None (Preliminary result)    Collection Time: 08/07/15 10:43 AM  Result Value Ref Range Status   Specimen Description LEFT ANTECUBITAL  Final   Special Requests BOTTLES DRAWN AEROBIC AND ANAEROBIC 5CC  Final   Culture   Final    NO GROWTH 1 DAY Performed at [facility]    Report Status PENDING  Incomplete  Blood culture (routine x 2)     Status: None (Preliminary result)   Collection Time: 08/07/15 11:10 AM  Result Value Ref Range Status   Specimen Description RIGHT ANTECUBITAL  Final   Special Requests BOTTLES DRAWN AEROBIC AND ANAEROBIC 5CC  Final   Culture   Final    NO GROWTH < 24 HOURS Performed at [facility]    Report Status PENDING  Incomplete  MRSA PCR Screening     Status: None   Collection Time: 08/07/15  7:35 PM  Result Value Ref Range Status   MRSA by PCR NEGATIVE NEGATIVE Final    Comment:        The GeneXpert MRSA Assay (FDA approved for NASAL specimens only), is one component of a comprehensive MRSA colonization surveillance program.  It is not intended to diagnose MRSA infection nor to guide or monitor treatment for MRSA infections.      Studies: Ct Chest W Contrast  08/07/2015  CLINICAL DATA:  Right-sided pain with episode of emesis yesterday. Elevated white count. EXAM: CT CHEST, ABDOMEN, AND PELVIS WITH CONTRAST TECHNIQUE: Multidetector CT imaging of the chest, abdomen and pelvis was performed following the standard protocol during bolus administration of intravenous contrast. CONTRAST:  22mL OMNIPAQUE IOHEXOL 300 MG/ML SOLN, 79mL OMNIPAQUE IOHEXOL 300 MG/ML SOLN COMPARISON:  CT chest dated 07/05/2015 and CT abdomen and pelvis dated 03/10/2015 FINDINGS: CT CHEST FINDINGS Mediastinum/Lymph Nodes: Scattered mild atherosclerotic changes again noted along the walls of the normal-caliber thoracic aorta. No aortic aneurysm or dissection. Heart size is normal. No pericardial effusion. No mass or enlarged lymph nodes seen within the mediastinum or perihilar regions. Lungs/Pleura:  Bilateral pleural effusions, moderate to large in size, with adjacent compressive atelectasis. No convincing pneumothorax, perhaps trace pneumothorax along the right lateral chest wall, image 27 of series 2. Musculoskeletal: There are displaced fractures of the right sixth through tenth posterior-lateral ribs. The right seventh through ninth ribs are fractured in more than 1 location compatible with flail chest. The right ninth posterior rib fracture is severely comminuted and displaced. The right posterior eighth rib fracture is also significantly displaced, with greater than 1 cm diastases. Patient has chronic compression fracture deformities within the mid to lower thoracic spine status post treatment with vertebroplasty. There is a new mild compression fracture deformity involving the superior endplate of the L1 vertebral body. CT ABDOMEN PELVIS FINDINGS Hepatobiliary: No masses or other significant abnormality. Status post cholecystectomy. Pancreas: No mass, inflammatory changes, or other significant abnormality. Spleen: Within normal limits in size and appearance. Adrenals/Urinary Tract: No masses identified. No evidence of hydronephrosis. Bladder mildly distended but otherwise unremarkable. Stomach/Bowel: Scattered diverticulosis noted within the sigmoid colon without evidence of acute diverticulitis. Transverse colon and right colon mildly distended with gas and stool. Bowel otherwise normal in caliber and configuration throughout. No evidence of bowel wall thickening or bowel wall inflammation. Vascular/Lymphatic: Scattered atherosclerotic changes of the normal-caliber abdominal aorta. No acute- appearing vascular abnormality identified. No enlarged lymph nodes seen within the abdomen or pelvis. Reproductive: No mass or other significant abnormality. Other: No free fluid or hemorrhage identified in the abdomen or pelvis. No free intraperitoneal air. Musculoskeletal: Mild compression fracture deformity of  the L1 vertebral body which appears acute. Degenerative changes throughout the lumbar spine, at least moderate in degree. Status post previous right hip arthroplasty. Superficial soft tissues are unremarkable. IMPRESSION: 1. Displaced fractures of the right sixth through tenth posterior-lateral ribs. Right seventh through ninth ribs are fractured in more than 1 location indicating flail chest. The right ninth posterior rib fracture is severely comminuted and displaced. The right posterior eighth rib fracture is also significantly displaced, with greater than 1 cm displacement and over-riding of the main fracture fragments. 2. No convincing pneumothorax, perhaps focal trace pneumothorax along the right lateral chest wall. Small amount of associated soft tissue air about the rib fracture sites. 3. Bilateral pleural effusions, moderate to large in size, with adjacent compressive atelectasis. These pleural effusions appear stable compared to the previous chest CT of 07/05/2015. 4. No acute mediastinal abnormality. 5. Mild acute-appearing compression fracture deformity involving the superior endplate of the L1 vertebral body, new compared to previous CTs, presumably fairly acute. Additional chronic compression fracture deformities within the thoracic spine. 6. No evidence of acute intra-abdominal or intrapelvic abnormality. Fairly  large amount of stool and gas within the colon (constipation? ). These results were called by telephone at the time of interpretation on 08/07/2015 at 11:57 am to Dr. Shirlyn Goltz , who verbally acknowledged these results. Electronically Signed   By: Franki Cabot M.D.   On: 08/07/2015 11:58   Ct Abdomen Pelvis W Contrast  08/07/2015  CLINICAL DATA:  Right-sided pain with episode of emesis yesterday. Elevated white count. EXAM: CT CHEST, ABDOMEN, AND PELVIS WITH CONTRAST TECHNIQUE: Multidetector CT imaging of the chest, abdomen and pelvis was performed following the standard protocol during  bolus administration of intravenous contrast. CONTRAST:  37mL OMNIPAQUE IOHEXOL 300 MG/ML SOLN, 30mL OMNIPAQUE IOHEXOL 300 MG/ML SOLN COMPARISON:  CT chest dated 07/05/2015 and CT abdomen and pelvis dated 03/10/2015 FINDINGS: CT CHEST FINDINGS Mediastinum/Lymph Nodes: Scattered mild atherosclerotic changes again noted along the walls of the normal-caliber thoracic aorta. No aortic aneurysm or dissection. Heart size is normal. No pericardial effusion. No mass or enlarged lymph nodes seen within the mediastinum or perihilar regions. Lungs/Pleura: Bilateral pleural effusions, moderate to large in size, with adjacent compressive atelectasis. No convincing pneumothorax, perhaps trace pneumothorax along the right lateral chest wall, image 27 of series 2. Musculoskeletal: There are displaced fractures of the right sixth through tenth posterior-lateral ribs. The right seventh through ninth ribs are fractured in more than 1 location compatible with flail chest. The right ninth posterior rib fracture is severely comminuted and displaced. The right posterior eighth rib fracture is also significantly displaced, with greater than 1 cm diastases. Patient has chronic compression fracture deformities within the mid to lower thoracic spine status post treatment with vertebroplasty. There is a new mild compression fracture deformity involving the superior endplate of the L1 vertebral body. CT ABDOMEN PELVIS FINDINGS Hepatobiliary: No masses or other significant abnormality. Status post cholecystectomy. Pancreas: No mass, inflammatory changes, or other significant abnormality. Spleen: Within normal limits in size and appearance. Adrenals/Urinary Tract: No masses identified. No evidence of hydronephrosis. Bladder mildly distended but otherwise unremarkable. Stomach/Bowel: Scattered diverticulosis noted within the sigmoid colon without evidence of acute diverticulitis. Transverse colon and right colon mildly distended with gas and  stool. Bowel otherwise normal in caliber and configuration throughout. No evidence of bowel wall thickening or bowel wall inflammation. Vascular/Lymphatic: Scattered atherosclerotic changes of the normal-caliber abdominal aorta. No acute- appearing vascular abnormality identified. No enlarged lymph nodes seen within the abdomen or pelvis. Reproductive: No mass or other significant abnormality. Other: No free fluid or hemorrhage identified in the abdomen or pelvis. No free intraperitoneal air. Musculoskeletal: Mild compression fracture deformity of the L1 vertebral body which appears acute. Degenerative changes throughout the lumbar spine, at least moderate in degree. Status post previous right hip arthroplasty. Superficial soft tissues are unremarkable. IMPRESSION: 1. Displaced fractures of the right sixth through tenth posterior-lateral ribs. Right seventh through ninth ribs are fractured in more than 1 location indicating flail chest. The right ninth posterior rib fracture is severely comminuted and displaced. The right posterior eighth rib fracture is also significantly displaced, with greater than 1 cm displacement and over-riding of the main fracture fragments. 2. No convincing pneumothorax, perhaps focal trace pneumothorax along the right lateral chest wall. Small amount of associated soft tissue air about the rib fracture sites. 3. Bilateral pleural effusions, moderate to large in size, with adjacent compressive atelectasis. These pleural effusions appear stable compared to the previous chest CT of 07/05/2015. 4. No acute mediastinal abnormality. 5. Mild acute-appearing compression fracture deformity involving the superior endplate of  the L1 vertebral body, new compared to previous CTs, presumably fairly acute. Additional chronic compression fracture deformities within the thoracic spine. 6. No evidence of acute intra-abdominal or intrapelvic abnormality. Fairly large amount of stool and gas within the colon  (constipation? ). These results were called by telephone at the time of interpretation on 08/07/2015 at 11:57 am to Dr. Shirlyn Goltz , who verbally acknowledged these results. Electronically Signed   By: Franki Cabot M.D.   On: 08/07/2015 11:58   Dg Chest Port 1 View  08/09/2015  CLINICAL DATA:  Follow-up right pleural effusion/ hemothorax. Status post multiple right rib fractures. EXAM: PORTABLE CHEST 1 VIEW COMPARISON:  Portable chest x-ray of August 08, 2015 FINDINGS: The pleural effusion -hemo thorax on the right has increased and is moderate in size. Multiple fractures of the posterior lateral aspects of the right fifth through ninth ribs are again demonstrated. There is no pneumothorax. There is stable left lower lobe atelectasis and small pleural effusion. The cardiac silhouette is top-normal in size. The central pulmonary vascularity is prominent. The bony structures other than the right lateral ribs exhibit no acute abnormalities. IMPRESSION: Slight interval increase in the volume of the right-sided pleural effusion-hemothorax. Stable left basilar atelectasis and small pleural effusion. There is mild central pulmonary vascular congestion. Electronically Signed   By: David  Martinique M.D.   On: 08/09/2015 07:11   Dg Chest Port 1 View  08/08/2015  CLINICAL DATA:  Right-sided rib fractures, flail chest, shortness of breath, CHF previous CVA EXAM: PORTABLE CHEST 1 VIEW COMPARISON:  Portable chest x-ray of August 07, 2015 FINDINGS: The right-sided pleural effusion has increased and is moderate in size. The small left pleural effusion is stable. The retrocardiac region on the left remains dense. The heart is normal in size. The pulmonary vascularity is indistinct but stable. There is no pneumothorax. Fractures of the lateral aspects of the right sixth through ninth ribs are demonstrated. IMPRESSION: Increased pleural fluid on the right likely hemothorax. Small left pleural effusion. Stable pulmonary vascular  congestion with mild interstitial edema consistent with CHF. Left lower lobe atelectasis. Electronically Signed   By: David  Martinique M.D.   On: 08/08/2015 07:02   Dg Chest Port 1 View  08/07/2015  CLINICAL DATA:  Altered mental status and shortness of breath today, vomiting yesterday per nurse's note. EXAM: PORTABLE CHEST 1 VIEW COMPARISON:  Chest x-ray dated 08/05/2015. FINDINGS: Mild cardiomegaly is unchanged. There is new central pulmonary vascular congestion and bilateral interstitial edema. Moderate sized layering pleural effusion on the left appears grossly stable, with probable adjacent atelectasis. There are acute displaced fractures of the lateral right sixth through eighth ribs, new compared to plain film of 08/05/2015 and chest CT 07/05/2015, with additional questionable slightly displaced fracture of the right posterior fifth rib. No left-sided rib fracture seen. IMPRESSION: 1. New displaced fractures of the right lateral sixth through eighth ribs, with questionable additional fracture of the posterior right fifth rib. 2. Cardiomegaly with central pulmonary vascular congestion and new bilateral interstitial edema suggesting volume overload/CHF. More confluent opacities at the right lung base suggest either confluent pulmonary edema or lung contusion (given the overlying rib fractures). 3. Moderate sized left pleural effusion, with probable associated atelectasis, appears stable. These results were called by telephone at the time of interpretation on 08/07/2015 at 10:31 am to Dr. Shirlyn Goltz , who verbally acknowledged these results. Electronically Signed   By: Franki Cabot M.D.   On: 08/07/2015 10:33    Scheduled Meds: .  cholecalciferol  1,000 Units Oral BID  . feeding supplement (ENSURE ENLIVE)  237 mL Oral BID BM  . furosemide  20 mg Oral Daily  . levofloxacin (LEVAQUIN) IV  750 mg Intravenous Q48H  . levothyroxine  75 mcg Oral QAC breakfast  . metoprolol tartrate  25 mg Oral BID  .  multivitamin with minerals  1 tablet Oral Daily  . pantoprazole  40 mg Oral Daily  . sertraline  50 mg Oral Daily  . 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 If 7PM-7AM, please contact night-coverage at [email or web address], password [facility] 08/09/2015, 10:02 AM  LOS: 2 days

## 2015-08-10 ENCOUNTER — Inpatient Hospital Stay (HOSPITAL_COMMUNITY): Payer: Medicare Other

## 2015-08-10 LAB — BODY FLUID CELL COUNT WITH DIFFERENTIAL
Eos, Fluid: 2 %
LYMPHS FL: 10 %
Monocyte-Macrophage-Serous Fluid: 7 % — ABNORMAL LOW (ref 50–90)
Neutrophil Count, Fluid: 81 % — ABNORMAL HIGH (ref 0–25)
Total Nucleated Cell Count, Fluid: 682 cu mm (ref 0–1000)

## 2015-08-10 LAB — BASIC METABOLIC PANEL
Anion gap: 7 (ref 5–15)
BUN: 16 mg/dL (ref 6–20)
CALCIUM: 9.6 mg/dL (ref 8.9–10.3)
CHLORIDE: 95 mmol/L — AB (ref 101–111)
CO2: 32 mmol/L (ref 22–32)
CREATININE: 1.01 mg/dL — AB (ref 0.44–1.00)
GFR calc Af Amer: 58 mL/min — ABNORMAL LOW (ref >60)
GFR calc non Af Amer: 50 mL/min — ABNORMAL LOW (ref >60)
GLUCOSE: 120 mg/dL — AB (ref 65–99)
Potassium: 3.8 mmol/L (ref 3.5–5.1)
Sodium: 134 mmol/L — ABNORMAL LOW (ref 135–145)

## 2015-08-10 LAB — LEGIONELLA PNEUMOPHILA SEROGP 1 UR AG: L. PNEUMOPHILA SEROGP 1 UR AG: NEGATIVE

## 2015-08-10 LAB — CBC
HCT: 30.6 % — ABNORMAL LOW (ref 36.0–46.0)
Hemoglobin: 9.9 g/dL — ABNORMAL LOW (ref 12.0–15.0)
MCH: 30.8 pg (ref 26.0–34.0)
MCHC: 32.4 g/dL (ref 30.0–36.0)
MCV: 95.3 fL (ref 78.0–100.0)
PLATELETS: 338 10*3/uL (ref 150–400)
RBC: 3.21 MIL/uL — ABNORMAL LOW (ref 3.87–5.11)
RDW: 13.3 % (ref 11.5–15.5)
WBC: 12.6 10*3/uL — ABNORMAL HIGH (ref 4.0–10.5)

## 2015-08-10 LAB — GRAM STAIN

## 2015-08-10 LAB — GLUCOSE, SEROUS FLUID: GLUCOSE FL: 119 mg/dL

## 2015-08-10 LAB — PROTEIN, BODY FLUID: TOTAL PROTEIN, FLUID: 3.5 g/dL

## 2015-08-10 LAB — GLUCOSE, CAPILLARY: GLUCOSE-CAPILLARY: 113 mg/dL — AB (ref 65–99)

## 2015-08-10 MED ORDER — LIDOCAINE HCL (PF) 1 % IJ SOLN
INTRAMUSCULAR | Status: AC
  Filled 2015-08-10: qty 10

## 2015-08-10 NOTE — Progress Notes (Signed)
[facility] PROGRESS NOTE  Linda [name] [id number] DOB: [birth year] DOA: 08/07/2015 PCP: [name], MD  Assessment/Plan: 1. Multiple rib fractures with flail chest- patient seen by cardiothoracic surgeon Dr. Ricard Dillon. Plan is to proceed with thoracentesis bilaterally, alternates days  for palliative purpose.  Pain is adequately controlled. Continue with Tylenol prn.   2. Chronic diastolic CHF/bilateral pleural effusion- patient is currently not in respiratory distress, appears euvolemic though chest x-ray shows CHF, continue Lasix 20 milligrams by mouth daily. Monitor renal function.   3. Lobar pneumonia- patient started on empiric Levaquin considering leukocytosis and compression atelectasis seen on chest x-ray and CT scan. Urinary strep pneumo antigen is negative urine for Legionella is pending, blood cultures negative to date.  4. Essential hypertension- blood pressure controlled, continue metoprolol, Lasix, when necessary hydralazine.  5. Paroxysmal atrial fibrillation- rate controlled with metoprolol. CHADS2VASC score is 6. Patient takes Apixaban  at home. Anticoagulation on hold to prevent hemothorax.  6. Hemiparesis/aphasia due to cva- stable  7. Depression-continue Zolof  8. Hypomagnesemia- magnesium is 1.7, received  2 g mag sulfate IV 1. Repeat in am.   9. Endplate vertebral fracture- CT abdomen pelvis shows mild acute appearing compression fracture deformity involving the superior end plate of L1 vertebral body which is new compared to previous CT. Continue pain management. Patient does not appear to be in severe pain. 10. DVT prophylaxis- SCDs  Code Status: DNR. Family Communication: care discussed with caregiver who was at bedside.  Disposition Plan: Pending improvement in the chest pain, goals of care discussion, thoracentesis planned for today.      Consultants:  CVT S  Procedures:  None  Antibiotics:  None:  HPI/Subjective: Alert in  no distress. Denies pain. Had BM yesterday.  Eating ok per caregiver.   Objective: Filed Vitals:   08/10/15 0503 08/10/15 0804  BP: 130/70 134/76  Pulse: 74 82  Temp: 97.6 F (36.4 C) 98.3 F (36.8 C)  Resp: 18 24    Intake/Output Summary (Last 24 hours) at 08/10/15 0848 Last data filed at 08/09/15 2148  Gross per 24 hour  Intake    583 ml  Output      0 ml  Net    583 ml   Filed Weights   08/08/15 0500 08/09/15 0500 08/10/15 0459  Weight: 44.5 kg (98 lb 1.7 oz) 46 kg (101 lb 6.6 oz) 48.3 kg (106 lb 7.7 oz)    Exam:   General:  Appears in no acute distress, lying comfortably  Cardiovascular: S1-S2 regular  Respiratory: Clear to auscultation bilaterally  Abdomen: Soft, nontender, no organomegaly  Musculoskeletal: edema of the lower extremities   Data Reviewed: Basic Metabolic Panel:  Recent Labs Lab 08/07/15 0945 08/08/15 0517 08/09/15 0305 08/10/15 0324  NA 140 139 136 134*  K 2.9* 2.8* 3.7 3.8  CL 98* 98* 100* 95*  CO2 33* 33* 31 32  GLUCOSE 147* 115* 119* 120*  BUN 25* 15 13 16   CREATININE 1.06* 0.92 0.96 1.01*  CALCIUM 10.9* 10.2 9.9 9.6  MG  --  1.7 1.7  --   PHOS  --  2.7  --   --    Liver Function Tests:  Recent Labs Lab 08/07/15 0945 08/08/15 0517 08/09/15 0305  AST 36 26 28  ALT 27 22 21   ALKPHOS 119 97 99  BILITOT 0.5 0.6 0.4  PROT 7.0 5.9* 5.5*  ALBUMIN 3.5 2.9* 2.7*    Recent Labs Lab 08/07/15 0945  LIPASE 25   No  results for input(s): AMMONIA in the last 168 hours. CBC:  Recent Labs Lab 08/07/15 0945 08/08/15 0517 08/10/15 0324  WBC 14.5* 12.0* 12.6*  NEUTROABS 11.9* 9.6*  --   HGB 9.7* 9.2* 9.9*  HCT 29.9* 28.1* 30.6*  MCV 96.1 94.3 95.3  PLT 306 262 338   Cardiac Enzymes: No results for input(s): CKTOTAL, CKMB, CKMBINDEX, TROPONINI in the last 168 hours. BNP (last 3 results)  Recent Labs  07/06/15 0442  BNP 220.4*    ProBNP (last 3 results) No results for input(s): PROBNP in the last 8760  hours.  CBG:  Recent Labs Lab 08/07/15 0941 08/08/15 0752 08/08/15 1220 08/09/15 0808  GLUCAP 136* 100* 125* 108*    Recent Results (from the past 240 hour(s))  Urine culture     Status: None   Collection Time: 08/07/15  9:50 AM  Result Value Ref Range Status   Specimen Description URINE, CATHETERIZED  Final   Special Requests NONE  Final   Culture   Final    NO GROWTH 1 DAY Performed at [facility]    Report Status 08/08/2015 FINAL  Final  Blood culture (routine x 2)     Status: None (Preliminary result)   Collection Time: 08/07/15 10:43 AM  Result Value Ref Range Status   Specimen Description LEFT ANTECUBITAL  Final   Special Requests BOTTLES DRAWN AEROBIC AND ANAEROBIC 5CC  Final   Culture   Final    NO GROWTH 2 DAYS Performed at [facility]    Report Status PENDING  Incomplete  Blood culture (routine x 2)     Status: None (Preliminary result)   Collection Time: 08/07/15 11:10 AM  Result Value Ref Range Status   Specimen Description RIGHT ANTECUBITAL  Final   Special Requests BOTTLES DRAWN AEROBIC AND ANAEROBIC 5CC  Final   Culture   Final    NO GROWTH 2 DAYS Performed at [facility]    Report Status PENDING  Incomplete  MRSA PCR Screening     Status: None   Collection Time: 08/07/15  7:35 PM  Result Value Ref Range Status   MRSA by PCR NEGATIVE NEGATIVE Final    Comment:        The GeneXpert MRSA Assay (FDA approved for NASAL specimens only), is one component of a comprehensive MRSA colonization surveillance program. It is not intended to diagnose MRSA infection nor to guide or monitor treatment for MRSA infections.      Studies: Dg Chest Port 1 View  08/09/2015  CLINICAL DATA:  Follow-up right pleural effusion/ hemothorax. Status post multiple right rib fractures. EXAM: PORTABLE CHEST 1 VIEW COMPARISON:  Portable chest x-ray of August 08, 2015 FINDINGS: The pleural effusion -hemo thorax on the right has increased and is  moderate in size. Multiple fractures of the posterior lateral aspects of the right fifth through ninth ribs are again demonstrated. There is no pneumothorax. There is stable left lower lobe atelectasis and small pleural effusion. The cardiac silhouette is top-normal in size. The central pulmonary vascularity is prominent. The bony structures other than the right lateral ribs exhibit no acute abnormalities. IMPRESSION: Slight interval increase in the volume of the right-sided pleural effusion-hemothorax. Stable left basilar atelectasis and small pleural effusion. There is mild central pulmonary vascular congestion. Electronically Signed   By: David  Martinique M.D.   On: 08/09/2015 07:11    Scheduled Meds: . cholecalciferol  1,000 Units Oral BID  . feeding supplement (ENSURE ENLIVE)  237 mL  Oral BID BM  . furosemide  20 mg Oral Daily  . levofloxacin (LEVAQUIN) IV  750 mg Intravenous Q48H  . levothyroxine  75 mcg Oral QAC breakfast  . metoprolol tartrate  25 mg Oral BID  . multivitamin with minerals  1 tablet Oral Daily  . pantoprazole  40 mg Oral Daily  . sertraline  50 mg Oral Daily  . 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 If 7PM-7AM, please contact night-coverage at [email or web address], password [facility] 08/10/2015, 8:48 AM  LOS: 3 days

## 2015-08-10 NOTE — Progress Notes (Signed)
Pt returned from US thoracentesis alert Bandaid intact Rt back VS stable. No difficulty breathing.

## 2015-08-10 NOTE — Progress Notes (Signed)
Attempted to contact the patient's son to discuss obtaining consent for thoracentesis planned for today.  Legrand Como, the son with POA did not answer at any of the numbers listed.  This RN left a message on the number listed as "cell phone" and included the phone number of this unit for him to call upon receiving the message.

## 2015-08-10 NOTE — Progress Notes (Signed)
      [address]       [place] [address]             [phone]     CARDIOTHORACIC SURGERY PROGRESS NOTE  Subjective: Looks good and nods head stating that breathing has improved since thoracentesis.  Comfortable.  Objective: Vital signs in last 24 hours: Temp:  [97.6 F (36.4 C)-99.1 F (37.3 C)] 98.3 F (36.8 C) (12/07 0804) Pulse Rate:  [67-89] 89 (12/07 1223) Cardiac Rhythm:  [-] Normal sinus rhythm (12/07 0730) Resp:  [17-29] 24 (12/07 0804) BP: (118-149)/(64-99) 118/99 mmHg (12/07 1500) SpO2:  [92 %-99 %] 97 % (12/07 0804) Weight:  [48.3 kg (106 lb 7.7 oz)] 48.3 kg (106 lb 7.7 oz) (12/07 0459)  Physical Exam:  Rhythm:   sinus  Breath sounds: Diminished at bases  Heart sounds:  RRR  Incisions:  n/a  Abdomen:  soft  Extremities:  warm   Intake/Output from previous day: 12/06 0701 - 12/07 0700 In: 783 [P.O.:580; I.V.:3; IV Piggyback:200] Out: -  Intake/Output this shift: Total I/O In: 654 [P.O.:654] Out: 2 [Urine:2]  Lab Results:  Recent Labs  08/08/15 0517 08/10/15 0324  WBC 12.0* 12.6*  HGB 9.2* 9.9*  HCT 28.1* 30.6*  PLT 262 338   BMET:  Recent Labs  08/09/15 0305 08/10/15 0324  NA 136 134*  K 3.7 3.8  CL 100* 95*  CO2 31 32  GLUCOSE 119* 120*  BUN 13 16  CREATININE 0.96 1.01*  CALCIUM 9.9 9.6    CBG (last 3)   Recent Labs  08/08/15 1220 08/09/15 0808 08/10/15 0802  GLUCAP 125* 108* 113*   PT/INR:   Recent Labs  08/08/15 0517  LABPROT 16.1*  INR 1.28    CXR:  CHEST 1 VIEW  COMPARISON: the previous day's study  FINDINGS: No pneumothorax. Significant improvement right pleural effusion with small residual. Stable moderate left pleural effusion. Atelectasis/ consolidation at both lung bases, improved on the right since previous exam. Mild cardiomegaly stable. Atheromatous aorta. Changes of two level kyphoplasty/vertebroplasty in the mid thoracic spine as before. Multiple displaced right rib  fractures as before.  IMPRESSION: 1. No pneumothorax post right thoracentesis, with small residual effusion.   Electronically Signed  By: Lucrezia Europe M.D.  On: 08/10/2015 12:13  Assessment/Plan:  Excellent results w/ right thoracentesis with small residual right pleural effusion +/- hemothorax.  Will plan left thoracentesis for tomorrow.  I spent in excess of 15 minutes during the conduct of this hospital encounter and >50% of this time involved direct face-to-face encounter with the patient for counseling and/or coordination of their care.    Rexene Alberts, MD 08/10/2015 4:12 PM

## 2015-08-10 NOTE — Progress Notes (Signed)
I stopped to check on Ms. Bacino this afternoon following her right sided thoracentesis.  She reports that her breathing is better following thoracentesis this afternoon.  She denies complaints other than "some" pain.  Discussed with her RN who will giver her PRN tylenol.  Plan reported to be left sided thoracentesis tomorrow.  Eventual goal is home with hospice.  Will f/u tomorrow.  Micheline Rough, MD [place] Team [phone]

## 2015-08-10 NOTE — Progress Notes (Signed)
Pt unable to sign her own consent for thoracentesis on right. Obtained phone consent from Syrina Lonsdale. Witnessed by 2 RN's. [place] called when consent was obtained. Son said that Dr Roxy Manns had called him yesterday and fully explained everything did not have any more questions.

## 2015-08-10 NOTE — Progress Notes (Signed)
PT Cancellation Note  Patient Details Name: Linda [name] MRN: [id number] DOB: 01-May-1929   Cancelled Treatment:    Reason Eval/Treat Not Completed: Medical issues which prohibited therapy (pt going for thoracentesis.  Nurse asked to [place].  Will f/u tomorrow.  )Thanks.    Irwin Brakeman F 08/10/2015, 10:40 AM  [facility] [phone] (pager)

## 2015-08-10 NOTE — Procedures (Signed)
US Right thoracentesis No complication No blood loss. See complete dictation in [facility] PACS.  

## 2015-08-11 ENCOUNTER — Inpatient Hospital Stay (HOSPITAL_COMMUNITY): Payer: Medicare Other

## 2015-08-11 DIAGNOSIS — S2249XD Multiple fractures of ribs, unspecified side, subsequent encounter for fracture with routine healing: Secondary | ICD-10-CM

## 2015-08-11 DIAGNOSIS — N183 Chronic kidney disease, stage 3 (moderate): Secondary | ICD-10-CM

## 2015-08-11 DIAGNOSIS — S2241XS Multiple fractures of ribs, right side, sequela: Secondary | ICD-10-CM

## 2015-08-11 DIAGNOSIS — D631 Anemia in chronic kidney disease: Secondary | ICD-10-CM

## 2015-08-11 DIAGNOSIS — J181 Lobar pneumonia, unspecified organism: Secondary | ICD-10-CM

## 2015-08-11 LAB — BODY FLUID CELL COUNT WITH DIFFERENTIAL
Lymphs, Fluid: 35 %
MONOCYTE-MACROPHAGE-SEROUS FLUID: 32 % — AB (ref 50–90)
Neutrophil Count, Fluid: 33 % — ABNORMAL HIGH (ref 0–25)
WBC FLUID: 223 uL (ref 0–1000)

## 2015-08-11 LAB — PROTEIN, BODY FLUID: Total protein, fluid: <3 g/dL

## 2015-08-11 LAB — GLUCOSE, CAPILLARY: Glucose-Capillary: 108 mg/dL — ABNORMAL HIGH (ref 65–99)

## 2015-08-11 LAB — GLUCOSE, SEROUS FLUID: Glucose, Fluid: 130 mg/dL

## 2015-08-11 LAB — GRAM STAIN

## 2015-08-11 LAB — CBC
HCT: 30.6 % — ABNORMAL LOW (ref 36.0–46.0)
Hemoglobin: 9.6 g/dL — ABNORMAL LOW (ref 12.0–15.0)
MCH: 30.1 pg (ref 26.0–34.0)
MCHC: 31.4 g/dL (ref 30.0–36.0)
MCV: 95.9 fL (ref 78.0–100.0)
PLATELETS: 378 10*3/uL (ref 150–400)
RBC: 3.19 MIL/uL — AB (ref 3.87–5.11)
RDW: 13.5 % (ref 11.5–15.5)
WBC: 11.2 10*3/uL — ABNORMAL HIGH (ref 4.0–10.5)

## 2015-08-11 LAB — BASIC METABOLIC PANEL
Anion gap: 6 (ref 5–15)
BUN: 20 mg/dL (ref 6–20)
CO2: 33 mmol/L — AB (ref 22–32)
Calcium: 9.7 mg/dL (ref 8.9–10.3)
Chloride: 99 mmol/L — ABNORMAL LOW (ref 101–111)
Creatinine, Ser: 1.06 mg/dL — ABNORMAL HIGH (ref 0.44–1.00)
GFR calc Af Amer: 54 mL/min — ABNORMAL LOW (ref >60)
GFR, EST NON AFRICAN AMERICAN: 47 mL/min — AB (ref >60)
GLUCOSE: 111 mg/dL — AB (ref 65–99)
POTASSIUM: 3.8 mmol/L (ref 3.5–5.1)
Sodium: 138 mmol/L (ref 135–145)

## 2015-08-11 LAB — MAGNESIUM: Magnesium: 2.3 mg/dL (ref 1.7–2.4)

## 2015-08-11 MED ORDER — LIDOCAINE HCL (PF) 1 % IJ SOLN
INTRAMUSCULAR | Status: AC
  Filled 2015-08-11: qty 10

## 2015-08-11 NOTE — Progress Notes (Signed)
[facility] PROGRESS NOTE  Linda [name] [id number] DOB: 1928/09/28 DOA: 08/07/2015 PCP: [name], MD Interim summary: 81 year old female with past medical history of stroke, non verbal, paroxysmal a fib (on anticoagulation with coumadin), chronic diastolic CHF (last 2 D ECHO in 09/2014 with normal EF), hypertension, presents with flank pain on the right. She was found to have new displaced fractures on the right from 6th to 11th ribs, bilateral pleural effusions and lobar pneumonia. She was admitted to medical service for further evaluation. Palliative care consulted for goals of care, and plan for home hospice once thoracentesis is done.   Assessment/Plan: 1. Multiple rib fractures with flail chest- patient seen by cardiothoracic surgeon Dr. Ricard Dillon. Plan is to proceed with thoracentesis bilaterally, alternates days  for palliative purpose.  Pain is adequately controlled. Continue with Tylenol prn.   2. Chronic diastolic CHF/bilateral pleural effusion- patient is currently not in respiratory distress, appears euvolemic though chest x-ray shows CHF, continue Lasix 20 mg by mouth daily. Monitor renal function while on lasix.   3. Lobar pneumonia- patient started on empiric Levaquin considering leukocytosis and compression atelectasis seen on chest x-ray and CT scan. She will have completed 5 days by today.  Urinary strep pneumo antigen is negative urine for Legionella is pending, blood cultures negative to date.  4. Essential hypertension- blood pressure controlled, continue metoprolol, Lasix, and prn hydralazine.  5. Paroxysmal atrial fibrillation- rate controlled with metoprolol. CHADS2VASC score is 6. Patient takes Apixaban  at home. Anticoagulation on hold to prevent hemothorax.  6. Hemiparesis/aphasia due to cva- stable  7. Depression-continue Zolof  8. Hypomagnesemia- repleted and normal today.    9. Endplate vertebral fracture- CT abdomen pelvis shows mild acute  appearing compression fracture deformity involving the superior end plate of L1 vertebral body which is new compared to previous CT. Continue pain management. Patient does not appear to be in severe pain. 10. DVT prophylaxis- SCDs  Code Status: DNR. Family Communication: care discussed with caregiver who was at bedside.  Disposition Plan: plan for home hospice in 1 day after the thoracentesis.     Consultants:  CVT S  Palliative care   Procedures:  thoracentesis bilateral.   Antibiotics:  None:  HPI/Subjective: Residual pain, no new issues,breathing has improved.    Objective: Filed Vitals:   08/11/15 0400 08/11/15 0700  BP: 130/66 137/73  Pulse: 79 79  Temp: 98.7 F (37.1 C) 99 F (37.2 C)  Resp: 18 25    Intake/Output Summary (Last 24 hours) at 08/11/15 0958 Last data filed at 08/10/15 2153  Gross per 24 hour  Intake    420 ml  Output      1 ml  Net    419 ml   Filed Weights   08/09/15 0500 08/10/15 0459 08/11/15 0437  Weight: 46 kg (101 lb 6.6 oz) 48.3 kg (106 lb 7.7 oz) 47.3 kg (104 lb 4.4 oz)    Exam:   General:  Appears in no acute distress, lying comfortably  Cardiovascular: S1-S2 regular  Respiratory: Clear to auscultation bilaterally  Abdomen: Soft, nontender, no organomegaly  Musculoskeletal: edema of the lower extremities   Data Reviewed: Basic Metabolic Panel:  Recent Labs Lab 08/07/15 0945 08/08/15 0517 08/09/15 0305 08/10/15 0324 08/11/15 0332  NA 140 139 136 134* 138  K 2.9* 2.8* 3.7 3.8 3.8  CL 98* 98* 100* 95* 99*  CO2 33* 33* 31 32 33*  GLUCOSE 147* 115* 119* 120* 111*  BUN 25* 15 13  16 20  CREATININE 1.06* 0.92 0.96 1.01* 1.06*  CALCIUM 10.9* 10.2 9.9 9.6 9.7  MG  --  1.7 1.7  --  2.3  PHOS  --  2.7  --   --   --    Liver Function Tests:  Recent Labs Lab 08/07/15 0945 08/08/15 0517 08/09/15 0305  AST 36 26 28  ALT 27 22 21   ALKPHOS 119 97 99  BILITOT 0.5 0.6 0.4  PROT 7.0 5.9* 5.5*  ALBUMIN 3.5 2.9*  2.7*    Recent Labs Lab 08/07/15 0945  LIPASE 25   No results for input(s): AMMONIA in the last 168 hours. CBC:  Recent Labs Lab 08/07/15 0945 08/08/15 0517 08/10/15 0324 08/11/15 0332  WBC 14.5* 12.0* 12.6* 11.2*  NEUTROABS 11.9* 9.6*  --   --   HGB 9.7* 9.2* 9.9* 9.6*  HCT 29.9* 28.1* 30.6* 30.6*  MCV 96.1 94.3 95.3 95.9  PLT 306 262 338 378   Cardiac Enzymes: No results for input(s): CKTOTAL, CKMB, CKMBINDEX, TROPONINI in the last 168 hours. BNP (last 3 results)  Recent Labs  07/06/15 0442  BNP 220.4*    ProBNP (last 3 results) No results for input(s): PROBNP in the last 8760 hours.  CBG:  Recent Labs Lab 08/07/15 0941 08/08/15 0752 08/08/15 1220 08/09/15 0808 08/10/15 0802  GLUCAP 136* 100* 125* 108* 113*    Recent Results (from the past 240 hour(s))  Urine culture     Status: None   Collection Time: 08/07/15  9:50 AM  Result Value Ref Range Status   Specimen Description URINE, CATHETERIZED  Final   Special Requests NONE  Final   Culture   Final    NO GROWTH 1 DAY Performed at [facility]    Report Status 08/08/2015 FINAL  Final  Blood culture (routine x 2)     Status: None (Preliminary result)   Collection Time: 08/07/15 10:43 AM  Result Value Ref Range Status   Specimen Description LEFT ANTECUBITAL  Final   Special Requests BOTTLES DRAWN AEROBIC AND ANAEROBIC 5CC  Final   Culture   Final    NO GROWTH 3 DAYS Performed at [facility]    Report Status PENDING  Incomplete  Blood culture (routine x 2)     Status: None (Preliminary result)   Collection Time: 08/07/15 11:10 AM  Result Value Ref Range Status   Specimen Description RIGHT ANTECUBITAL  Final   Special Requests BOTTLES DRAWN AEROBIC AND ANAEROBIC 5CC  Final   Culture   Final    NO GROWTH 3 DAYS Performed at [facility]    Report Status PENDING  Incomplete  MRSA PCR Screening     Status: None   Collection Time: 08/07/15  7:35 PM  Result Value Ref Range  Status   MRSA by PCR NEGATIVE NEGATIVE Final    Comment:        The GeneXpert MRSA Assay (FDA approved for NASAL specimens only), is one component of a comprehensive MRSA colonization surveillance program. It is not intended to diagnose MRSA infection nor to guide or monitor treatment for MRSA infections.   Gram stain     Status: None   Collection Time: 08/10/15 11:05 AM  Result Value Ref Range Status   Specimen Description FLUID PLEURAL RIGHT  Final   Special Requests NONE  Final   Gram Stain   Final    MODERATE WBC PRESENT,BOTH PMN AND MONONUCLEAR NO ORGANISMS SEEN    Report Status 08/10/2015  FINAL  Final     Studies: Dg Chest 1 View  08/10/2015  CLINICAL DATA:  Bilateral pleural effusions, post right thoracentesis EXAM: CHEST  1 VIEW COMPARISON:  the previous day's study FINDINGS: No pneumothorax. Significant improvement right pleural effusion with small residual. Stable moderate left pleural effusion. Atelectasis/ consolidation at both lung bases, improved on the right since previous exam. Mild cardiomegaly stable. Atheromatous aorta. Changes of two level kyphoplasty/vertebroplasty in the mid thoracic spine as before. Multiple displaced right rib fractures as before. IMPRESSION: 1. No pneumothorax post right thoracentesis, with small residual effusion. Electronically Signed   By: Lucrezia Europe M.D.   On: 08/10/2015 12:13   Dg Chest Port 1 View  08/11/2015  CLINICAL DATA:  Pleural effusion. EXAM: PORTABLE CHEST 1 VIEW COMPARISON:  08/10/2015.  08/09/2015.  CT 08/07/2015 . FINDINGS: Mediastinum hilar structures are normal. Cardiomegaly with pulmonary vascular prominence diffuse bilateral infiltrates and bilateral effusions suggesting congestive heart failure. Multiple right rib fractures are again noted. These are displaced. Small right apical pneumothorax is noted. IMPRESSION: 1. Multiple displayed right rib fractures again noted. Small right apical pneumothorax noted on today's exam.  2. Cardiomegaly with pulmonary vascular congestion, bilateral pulmonary infiltrates and pleural effusions suggesting congestive heart failure. Critical Value/emergent results were called by telephone at the time of interpretation on 08/11/2015 at 7:16 am to nurse Gerald Stabs, who verbally acknowledged these results. Electronically Signed   By: Marcello Moores  Register   On: 08/11/2015 07:17   US Thoracentesis Asp Pleural Space W/img Guide  08/10/2015  CLINICAL DATA:  Multiple rib fractures, flail chest, pneumonia EXAM: EXAM THORACENTESIS WITH ULTRASOUND TECHNIQUE: The procedure, risks (including but not limited to bleeding, infection, organ damage ), benefits, and alternatives were explained to the patient and son, power of attorney. Questions regarding the procedure were encouraged and answered. The patient understands and consents to the procedure. Survey ultrasound of the right hemithorax was performed and an appropriate skin entry site was localized. Site was marked, prepped with Betadine, draped in usual sterile fashion, infiltrated locally with 1% lidocaine. The Saf-T-Centesis needle was advanced into the pleural space. Blood-tinged fluid returned. A total of 1 L was removed. Sample sent for the requested laboratory studies. Post procedure imaging shows small amount of residual fluid. The patient tolerated procedure well. COMPLICATIONS: COMPLICATIONS None immediate IMPRESSION: Technically successful ultrasound-guided right thoracentesis. Follow-up chest radiograph pending. Electronically Signed   By: Lucrezia Europe M.D.   On: 08/10/2015 12:35    Scheduled Meds: . cholecalciferol  1,000 Units Oral BID  . feeding supplement (ENSURE ENLIVE)  237 mL Oral BID BM  . furosemide  20 mg Oral Daily  . levofloxacin (LEVAQUIN) IV  750 mg Intravenous Q48H  . levothyroxine  75 mcg Oral QAC breakfast  . metoprolol tartrate  25 mg Oral BID  . multivitamin with minerals  1 tablet Oral Daily  . pantoprazole  40 mg Oral Daily  .  sertraline  50 mg Oral Daily  . 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 If 7PM-7AM, please contact night-coverage at [email or web address], password [facility] 08/11/2015, 9:58 AM  LOS: 4 days

## 2015-08-11 NOTE — Progress Notes (Signed)
Notified by Babette Relic, RN, CMRN of family request for [facility] [place] [facility] at home after discharge. Chart and patient information currently under review to confirm hospice eligibility.  Spoke with private paid caregiver Marissa at bedside, and then with son Legrand Como over the telephone  to initiate education related to hospice philosophy, services and team approach to care. Family verbalized understanding of the information.  Legrand Como would like to meet tomorrow to go over information again.  Unable to contact him to confirm appt.  Will check back in the morning.  Please send signed completed DNR form home with patient.  Patient will need prescriptions for discharge comfort medications.  No DME required at this time.  Family has hospital bed, BSC, shower chair and oxygen in the home.  [facility] aware of the above.  Completed discharge summary will need to be faxed to [facility] at [phone] when final.  Please notify [facility] when patient is ready to leave unit at discharge-call [phone].  [facility] information and contact numbers have been given to  [facility]  during visit.  Above information shared with Babette Relic, RN CMRN.  Please call with any questions.  Thank Dennis Bast  Mickie Kay, [place], [place]   [phone]

## 2015-08-11 NOTE — Progress Notes (Signed)
PT Cancellation Note  Patient Details Name: Linda [name] MRN: [id number] DOB: Oct 15, 1928   Cancelled Treatment:    Reason Eval/Treat Not Completed: Medical issues which prohibited therapy. Pt undergoing left side thoracentesis today. PT to re-attempt tomorrow.   Lorriane Shire 08/11/2015, 11:20 AM

## 2015-08-11 NOTE — Progress Notes (Signed)
Pt seen this am with her caregiver, Linda [name], at bedside. She reports that her breathing continues to feel improved following thoracentesis yesterday afternoon. She denies complaints.  Plan for left sided thoracentesis today. Eventual goal is home with hospice.  Micheline Rough, MD [facility] [phone]

## 2015-08-11 NOTE — Care Management Important Message (Signed)
Important Message  Patient Details  Name: Linda [name] MRN: [id number] Date of Birth: 03-17-1929   Medicare Important Message Given:  Yes    Nathen May 08/11/2015, 2:11 PM

## 2015-08-11 NOTE — Care Management Note (Signed)
Case Management Note  Patient Details  Name: Linda [name] MRN: [id number] Date of Birth: [birth year]  Subjective/Objective:    Pt lives with son, has 24 hr paid caregivers.  Family has chosen home hospice care for pt, provided list of agencies and referral made to [facility] per request.                       Expected Discharge Plan:  [place]  Discharge planning Services  CM Consult  Post Acute Care Choice:  Hospice Choice offered to:  Adult Children  HH Arranged:  RN, Social Work [employer] Agency:  [facility] [place]  Status of Service:  In process, will continue to follow  Girard Cooter, RN 08/11/2015, 1:58 PM

## 2015-08-12 ENCOUNTER — Inpatient Hospital Stay (HOSPITAL_COMMUNITY): Payer: Medicare Other

## 2015-08-12 DIAGNOSIS — S2241XA Multiple fractures of ribs, right side, initial encounter for closed fracture: Secondary | ICD-10-CM

## 2015-08-12 DIAGNOSIS — J90 Pleural effusion, not elsewhere classified: Secondary | ICD-10-CM

## 2015-08-12 LAB — CULTURE, BLOOD (ROUTINE X 2)
CULTURE: NO GROWTH
CULTURE: NO GROWTH

## 2015-08-12 LAB — GLUCOSE, CAPILLARY: GLUCOSE-CAPILLARY: 114 mg/dL — AB (ref 65–99)

## 2015-08-12 MED ORDER — ASPIRIN EC 81 MG PO TBEC: 81.0000 mg | DELAYED_RELEASE_TABLET | Freq: Every day | ORAL | Status: AC

## 2015-08-12 MED ORDER — ENSURE ENLIVE PO LIQD: 237.0000 mL | Freq: Two times a day (BID) | ORAL | Status: AC

## 2015-08-12 NOTE — Discharge Summary (Signed)
Physician Discharge Summary  PELLA ORIGER [id number] DOB: March 31, 1929 DOA: 08/07/2015  PCP: [name], MD  Admit date: 08/07/2015 Discharge date: 08/12/2015  Time spent: 57minutes  Recommendations for Outpatient Follow-up:  Follow upwith hospice MD as recommended.  Follow up with Dr Roxy Manns as recommended.  Follow up with PCP in one week and get CXR.    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  Filed Weights   08/10/15 0459 08/11/15 0437 08/12/15 0500  Weight: 48.3 kg (106 lb 7.7 oz) 47.3 kg (104 lb 4.4 oz) 49.3 kg (108 lb 11 oz)    History of present illness:   81 year old female with past medical history of stroke, non verbal, paroxysmal a fib (on anticoagulation with coumadin), chronic diastolic CHF (last 2 D ECHO in 09/2014 with normal EF), hypertension, presents with flank pain on the right. She was found to have new displaced fractures on the right from 6th to 11th ribs, bilateral pleural effusions and lobar pneumonia.  She was admitted to medical service for further evaluation. Palliative care consulted for goals of care, and plan for home hospice once thoracentesis is done. Hospital Course:  1. Multiple rib fractures with flail chest- patient seen by cardiothoracic surgeon Dr. Ricard Dillon.she underwent thoracocentesis bilateral and a repeat CT scan of the chest which showed moderate pleural effusion with compressive atelectasis. Small left pleural effusion. Improved aeration ont he left lower lobe . Pain is adequately controlled. Continue with Tylenol prn.recommend outpatient follow up with Dr Roxy Manns in 3 to 4 weeks and with PCP in one week and get a repeat CXR.   2. Chronic diastolic CHF/bilateral pleural effusion- patient is currently not in respiratory distress, appears euvolemic though chest x-ray shows CHF, continue Lasix 20 mg by mouth daily. Monitor renal function while on lasix.  3. Lobar pneumonia- patient started on empiric Levaquin considering leukocytosis and compression atelectasis seen on chest x-ray and CT scan. She will have completed 5 days . Urinary strep pneumo antigen is negative urine for Legionella is pending, blood cultures negative to date.  4. Essential hypertension- blood pressure controlled, continue metoprolol, Lasix, and prn hydralazine.  5. Paroxysmal atrial fibrillation- rate controlled with metoprolol. CHADS2VASC score is 6. Patient takes Apixaban at home. Anticoagulation on hold to prevent hemothorax. Started the patient on aspirin as per [facility] recommendations.   6. Hemiparesis/aphasia due to cva- stable  7. Depression-continue Zolof  8. Hypomagnesemia- repleted and normal today.  9. Endplate vertebral fracture- CT abdomen pelvis shows mild acute appearing compression fracture deformity involving the superior end plate of L1  vertebral body which is new compared to previous CT. Continue pain management. Patient does not appear to be in severe  pain.  Procedures:  Thoracentesis. BILATERAL.  Consultations:  Cardiothoracic surgery.   Palliative care   Hospice consult.     Discharge Exam: Filed Vitals:   08/12/15 1037 08/12/15 1157  BP: 127/65 115/79  Pulse: 88 77  Temp:  98 F (36.7 C)  Resp:  23    General: alert afebrile comfortable Cardiovascular: s1s2 Respiratory: diminished air entry at bases.  Discharge Instructions   Discharge Instructions    Diet general    Complete by:  As directed      Discharge instructions    Complete by:  As directed   Follow up with hospice MD as recommended. Follow up with cardiothoracic surgeon as recommended in 3 to 4 weeks, please call to make appointment.  Follow up with pcp to get repeat CXR in one week.          Current Discharge Medication List    START taking these medications   Details  aspirin EC 81 MG tablet Take 1 tablet (81 mg total) by mouth daily. Qty: 30 tablet, Refills: 0    feeding supplement, ENSURE ENLIVE, (ENSURE ENLIVE) LIQD Take 237 mLs by mouth 2 (two) times daily between meals. Qty: 237 mL, Refills: 12      CONTINUE these medications which have NOT CHANGED   Details  acetaminophen (TYLENOL) 500 MG tablet Take 1,000 mg by mouth every 6 (six) hours as needed for mild pain, moderate pain, fever or headache.    cholecalciferol (VITAMIN D) 1000 UNITS tablet Take 1,000 Units by mouth 2 (two) times daily.     denosumab (PROLIA) 60 MG/ML SOLN injection Inject 60 mg into the skin every 6 (six) months. Administer in upper arm, thigh, or abdomen    furosemide (LASIX) 20 MG tablet Take 1 tablet (20 mg total) by mouth daily. Qty: 30 tablet, Refills: 2    levothyroxine (SYNTHROID, LEVOTHROID) 75 MCG tablet Take 75 mcg by mouth daily before breakfast.    metoprolol tartrate (LOPRESSOR) 25 MG tablet Take 1 tablet (25 mg total) by mouth 2 (two) times daily. Qty: 30 tablet, Refills: 1    Multiple Vitamin (MULTIVITAMIN WITH MINERALS) TABS tablet Take  0.5 tablets by mouth daily with lunch.     pantoprazole (PROTONIX) 40 MG tablet Take 40 mg by mouth daily.     Propylene Glycol (SYSTANE BALANCE OP) Place 1 drop into both eyes daily as needed (dry eyes).     sertraline (ZOLOFT) 50 MG tablet Take 50 mg by mouth daily.  Refills: 3      STOP taking these medications     apixaban (ELIQUIS) 2.5 MG TABS tablet      ciprofloxacin (CIPRO) 250 MG tablet      methocarbamol (ROBAXIN) 500 MG tablet      tamsulosin (FLOMAX) 0.4 MG CAPS capsule        Allergies  Allergen Reactions  . Ibuprofen Anaphylaxis    Passed out and had to be hospitalized  . Lyrica [Pregabalin] Other (See Comments)    "made me a zombie'  . Penicillins Other (See Comments)    Skin on head was crawling and very very sleepy Has patient had a PCN reaction causing immediate rash, facial/tongue/throat swelling, SOB or lightheadedness with hypotension: No  Has patient had a PCN reaction causing severe rash involving mucus membranes or skin necrosis: No Has patient had a PCN reaction that  required hospitalization Unknown Has patient had a PCN reaction occurring within the last 10 years: Unknown If all of the above answers are "NO", then may proceed with Cephalosporin use.   . Demerol [Meperidine Hcl] Nausea And Vomiting  . Dilaudid [Hydromorphone Hcl] Nausea And Vomiting  . Morphine And Related Nausea And Vomiting  . Percocet [Oxycodone-Acetaminophen] Nausea And Vomiting  . Vicodin [Hydrocodone-Acetaminophen] Nausea And Vomiting  . Fentanyl Other (See Comments)    unknown  . Lidoderm [Lidocaine] Other (See Comments)    unknown  . Other Other (See Comments)    hamburger  . Tramadol Other (See Comments)    unknown      The results of significant diagnostics from this hospitalization (including imaging, microbiology, ancillary and laboratory) are listed below for reference.    Significant Diagnostic Studies: Dg Chest 1 View  08/11/2015  CLINICAL DATA:  Status  post left thoracentesis EXAM: CHEST  1 VIEW COMPARISON:  08/11/2015 FINDINGS: Cardiac shadow is stable. Persistent right-sided pleural effusion is again seen. Left pleural effusion has resolved following thoracentesis. No definitive pneumothorax is seen. Multiple rib fractures are noted on the right. The small apical pneumothorax is stable. Changes of prior vertebral augmentation are again seen. IMPRESSION: Status post left thoracentesis without pneumothorax. Persistent right-sided pleural effusion, right rib fractures and tiny right apical pneumothorax Electronically Signed   By: Inez Catalina M.D.   On: 08/11/2015 14:32   Dg Chest 1 View  08/10/2015  CLINICAL DATA:  Bilateral pleural effusions, post right thoracentesis EXAM: CHEST  1 VIEW COMPARISON:  the previous day's study FINDINGS: No pneumothorax. Significant improvement right pleural effusion with small residual. Stable moderate left pleural effusion. Atelectasis/ consolidation at both lung bases, improved on the right since previous exam. Mild cardiomegaly stable. Atheromatous aorta. Changes of two level kyphoplasty/vertebroplasty in the mid thoracic spine as before. Multiple displaced right rib fractures as before. IMPRESSION: 1. No pneumothorax post right thoracentesis, with small residual effusion. Electronically Signed   By: Lucrezia Europe M.D.   On: 08/10/2015 12:13   Ct Chest Wo Contrast  08/12/2015  CLINICAL DATA:  Fall on 08/07/2015 with hemothorax and rib fractures. Worsening pain. Shortness of breath. Initial encounter. EXAM: CT CHEST WITHOUT CONTRAST TECHNIQUE: Multidetector CT imaging of the chest was performed following the standard protocol without IV contrast. COMPARISON:  08/07/2015 chest CT.  08/11/2015 chest radiograph. FINDINGS: No enlarged axillary, mediastinal, or hilar lymph nodes are identified on this unenhanced study. The heart is normal in size. Mild aortic and coronary artery atherosclerotic calcification is noted. There is no  pericardial effusion. Moderate right pleural effusion has mildly increased in size compared to the prior CT. A small left pleural effusion has decreased in size. There is a tiny right pneumothorax as seen on recent chest radiographs. There is compressive atelectasis in the right lung with complete right lower lobe collapse. Left lower lobe aeration has improved from the prior CT, with persistent atelectasis posteriorly and medially. 3 mm left upper lobe nodules are unchanged from 11/04/2007 and considered benign (series 4, images 16 and 26). The visualized portion of the upper abdomen is unremarkable aside from vascular calcification. Fractures are again seen of the right fifth through tenth ribs, with multiple fractures being comminuted and with multiple ribs fractured in more than 1 location. There is unchanged, prominent inward displacement of the eighth and ninth rib fractures. Chronic T8 and T9 vertebral compression fractures are again seen status post augmentation. Mild L1 superior endplate compression fracture is unchanged and  recent in appearance. IMPRESSION: 1. Moderate right pleural effusion, mildly increased from the prior CT. Associated compressive atelectasis with complete right lower lobe collapse. 2. Small left pleural effusion, smaller than on the prior CT. Improved aeration of the left lower lobe. 3. Tiny right pneumothorax. 4. Numerous right rib fractures and mild L1 superior plate compression fracture as previously seen. Electronically Signed   By: Logan Bores M.D.   On: 08/12/2015 08:08   Ct Chest W Contrast  08/07/2015  CLINICAL DATA:  Right-sided pain with episode of emesis yesterday. Elevated white count. EXAM: CT CHEST, ABDOMEN, AND PELVIS WITH CONTRAST TECHNIQUE: Multidetector CT imaging of the chest, abdomen and pelvis was performed following the standard protocol during bolus administration of intravenous contrast. CONTRAST:  2mL OMNIPAQUE IOHEXOL 300 MG/ML SOLN, 104mL OMNIPAQUE  IOHEXOL 300 MG/ML SOLN COMPARISON:  CT chest dated 07/05/2015 and CT abdomen and pelvis dated 03/10/2015 FINDINGS: CT CHEST FINDINGS Mediastinum/Lymph Nodes: Scattered mild atherosclerotic changes again noted along the walls of the normal-caliber thoracic aorta. No aortic aneurysm or dissection. Heart size is normal. No pericardial effusion. No mass or enlarged lymph nodes seen within the mediastinum or perihilar regions. Lungs/Pleura: Bilateral pleural effusions, moderate to large in size, with adjacent compressive atelectasis. No convincing pneumothorax, perhaps trace pneumothorax along the right lateral chest wall, image 27 of series 2. Musculoskeletal: There are displaced fractures of the right sixth through tenth posterior-lateral ribs. The right seventh through ninth ribs are fractured in more than 1 location compatible with flail chest. The right ninth posterior rib fracture is severely comminuted and displaced. The right posterior eighth rib fracture is also significantly displaced, with greater than 1 cm diastases. Patient has chronic compression fracture deformities within the mid to lower thoracic spine status post treatment with vertebroplasty. There is a new mild compression fracture deformity involving the superior endplate of the L1 vertebral body. CT ABDOMEN PELVIS FINDINGS Hepatobiliary: No masses or other significant abnormality. Status post cholecystectomy. Pancreas: No mass, inflammatory changes, or other significant abnormality. Spleen: Within normal limits in size and appearance. Adrenals/Urinary Tract: No masses identified. No evidence of hydronephrosis. Bladder mildly distended but otherwise unremarkable. Stomach/Bowel: Scattered diverticulosis noted within the sigmoid colon without evidence of acute diverticulitis. Transverse colon and right colon mildly distended with gas and stool. Bowel otherwise normal in caliber and configuration throughout. No evidence of bowel wall thickening or bowel  wall inflammation. Vascular/Lymphatic: Scattered atherosclerotic changes of the normal-caliber abdominal aorta. No acute- appearing vascular abnormality identified. No enlarged lymph nodes seen within the abdomen or pelvis. Reproductive: No mass or other significant abnormality. Other: No free fluid or hemorrhage identified in the abdomen or pelvis. No free intraperitoneal air. Musculoskeletal: Mild compression fracture deformity of the L1 vertebral body which appears acute. Degenerative changes throughout the lumbar spine, at least moderate in degree. Status post previous right hip arthroplasty. Superficial soft tissues are unremarkable. IMPRESSION: 1. Displaced fractures of the right sixth through tenth posterior-lateral ribs. Right seventh through ninth ribs are fractured in more than 1 location indicating flail chest. The right ninth posterior rib fracture is severely comminuted and displaced. The right posterior eighth rib fracture is also significantly displaced, with greater than 1 cm displacement and over-riding of the main fracture fragments. 2. No convincing pneumothorax, perhaps focal trace pneumothorax along the right lateral chest wall. Small amount of associated soft tissue air about the rib fracture sites. 3. Bilateral pleural effusions, moderate to large in size, with adjacent compressive atelectasis. These pleural effusions appear stable compared  to the previous chest CT of 07/05/2015. 4. No acute mediastinal abnormality. 5. Mild acute-appearing compression fracture deformity involving the superior endplate of the L1 vertebral body, new compared to previous CTs, presumably fairly acute. Additional chronic compression fracture deformities within the thoracic spine. 6. No evidence of acute intra-abdominal or intrapelvic abnormality. Fairly large amount of stool and gas within the colon (constipation? ). These results were called by telephone at the time of interpretation on 08/07/2015 at 11:57 am to  Dr. Shirlyn Goltz , who verbally acknowledged these results. Electronically Signed   By: Franki Cabot M.D.   On: 08/07/2015 11:58   Ct Abdomen Pelvis W Contrast  08/07/2015  CLINICAL DATA:  Right-sided pain with episode of emesis yesterday. Elevated white count. EXAM: CT CHEST, ABDOMEN, AND PELVIS WITH CONTRAST TECHNIQUE: Multidetector CT imaging of the chest, abdomen and pelvis was performed following the standard protocol during bolus administration of intravenous contrast. CONTRAST:  86mL OMNIPAQUE IOHEXOL 300 MG/ML SOLN, 51mL OMNIPAQUE IOHEXOL 300 MG/ML SOLN COMPARISON:  CT chest dated 07/05/2015 and CT abdomen and pelvis dated 03/10/2015 FINDINGS: CT CHEST FINDINGS Mediastinum/Lymph Nodes: Scattered mild atherosclerotic changes again noted along the walls of the normal-caliber thoracic aorta. No aortic aneurysm or dissection. Heart size is normal. No pericardial effusion. No mass or enlarged lymph nodes seen within the mediastinum or perihilar regions. Lungs/Pleura: Bilateral pleural effusions, moderate to large in size, with adjacent compressive atelectasis. No convincing pneumothorax, perhaps trace pneumothorax along the right lateral chest wall, image 27 of series 2. Musculoskeletal: There are displaced fractures of the right sixth through tenth posterior-lateral ribs. The right seventh through ninth ribs are fractured in more than 1 location compatible with flail chest. The right ninth posterior rib fracture is severely comminuted and displaced. The right posterior eighth rib fracture is also significantly displaced, with greater than 1 cm diastases. Patient has chronic compression fracture deformities within the mid to lower thoracic spine status post treatment with vertebroplasty. There is a new mild compression fracture deformity involving the superior endplate of the L1 vertebral body. CT ABDOMEN PELVIS FINDINGS Hepatobiliary: No masses or other significant abnormality. Status post cholecystectomy.  Pancreas: No mass, inflammatory changes, or other significant abnormality. Spleen: Within normal limits in size and appearance. Adrenals/Urinary Tract: No masses identified. No evidence of hydronephrosis. Bladder mildly distended but otherwise unremarkable. Stomach/Bowel: Scattered diverticulosis noted within the sigmoid colon without evidence of acute diverticulitis. Transverse colon and right colon mildly distended with gas and stool. Bowel otherwise normal in caliber and configuration throughout. No evidence of bowel wall thickening or bowel wall inflammation. Vascular/Lymphatic: Scattered atherosclerotic changes of the normal-caliber abdominal aorta. No acute- appearing vascular abnormality identified. No enlarged lymph nodes seen within the abdomen or pelvis. Reproductive: No mass or other significant abnormality. Other: No free fluid or hemorrhage identified in the abdomen or pelvis. No free intraperitoneal air. Musculoskeletal: Mild compression fracture deformity of the L1 vertebral body which appears acute. Degenerative changes throughout the lumbar spine, at least moderate in degree. Status post previous right hip arthroplasty. Superficial soft tissues are unremarkable. IMPRESSION: 1. Displaced fractures of the right sixth through tenth posterior-lateral ribs. Right seventh through ninth ribs are fractured in more than 1 location indicating flail chest. The right ninth posterior rib fracture is severely comminuted and displaced. The right posterior eighth rib fracture is also significantly displaced, with greater than 1 cm displacement and over-riding of the main fracture fragments. 2. No convincing pneumothorax, perhaps focal trace pneumothorax along the right lateral chest wall.  Small amount of associated soft tissue air about the rib fracture sites. 3. Bilateral pleural effusions, moderate to large in size, with adjacent compressive atelectasis. These pleural effusions appear stable compared to the  previous chest CT of 07/05/2015. 4. No acute mediastinal abnormality. 5. Mild acute-appearing compression fracture deformity involving the superior endplate of the L1 vertebral body, new compared to previous CTs, presumably fairly acute. Additional chronic compression fracture deformities within the thoracic spine. 6. No evidence of acute intra-abdominal or intrapelvic abnormality. Fairly large amount of stool and gas within the colon (constipation? ). These results were called by telephone at the time of interpretation on 08/07/2015 at 11:57 am to Dr. Shirlyn Goltz , who verbally acknowledged these results. Electronically Signed   By: Franki Cabot M.D.   On: 08/07/2015 11:58   Dg Chest Port 1 View  08/11/2015  CLINICAL DATA:  Pleural effusion. EXAM: PORTABLE CHEST 1 VIEW COMPARISON:  08/10/2015.  08/09/2015.  CT 08/07/2015 . FINDINGS: Mediastinum hilar structures are normal. Cardiomegaly with pulmonary vascular prominence diffuse bilateral infiltrates and bilateral effusions suggesting congestive heart failure. Multiple right rib fractures are again noted. These are displaced. Small right apical pneumothorax is noted. IMPRESSION: 1. Multiple displayed right rib fractures again noted. Small right apical pneumothorax noted on today's exam. 2. Cardiomegaly with pulmonary vascular congestion, bilateral pulmonary infiltrates and pleural effusions suggesting congestive heart failure. Critical Value/emergent results were called by telephone at the time of interpretation on 08/11/2015 at 7:16 am to nurse Gerald Stabs, who verbally acknowledged these results. Electronically Signed   By: Marcello Moores  Register   On: 08/11/2015 07:17   Dg Chest Port 1 View  08/09/2015  CLINICAL DATA:  Follow-up right pleural effusion/ hemothorax. Status post multiple right rib fractures. EXAM: PORTABLE CHEST 1 VIEW COMPARISON:  Portable chest x-ray of August 08, 2015 FINDINGS: The pleural effusion -hemo thorax on the right has increased and is moderate  in size. Multiple fractures of the posterior lateral aspects of the right fifth through ninth ribs are again demonstrated. There is no pneumothorax. There is stable left lower lobe atelectasis and small pleural effusion. The cardiac silhouette is top-normal in size. The central pulmonary vascularity is prominent. The bony structures other than the right lateral ribs exhibit no acute abnormalities. IMPRESSION: Slight interval increase in the volume of the right-sided pleural effusion-hemothorax. Stable left basilar atelectasis and small pleural effusion. There is mild central pulmonary vascular congestion. Electronically Signed   By: David  Martinique M.D.   On: 08/09/2015 07:11   Dg Chest Port 1 View  08/08/2015  CLINICAL DATA:  Right-sided rib fractures, flail chest, shortness of breath, CHF previous CVA EXAM: PORTABLE CHEST 1 VIEW COMPARISON:  Portable chest x-ray of August 07, 2015 FINDINGS: The right-sided pleural effusion has increased and is moderate in size. The small left pleural effusion is stable. The retrocardiac region on the left remains dense. The heart is normal in size. The pulmonary vascularity is indistinct but stable. There is no pneumothorax. Fractures of the lateral aspects of the right sixth through ninth ribs are demonstrated. IMPRESSION: Increased pleural fluid on the right likely hemothorax. Small left pleural effusion. Stable pulmonary vascular congestion with mild interstitial edema consistent with CHF. Left lower lobe atelectasis. Electronically Signed   By: David  Martinique M.D.   On: 08/08/2015 07:02   Dg Chest Port 1 View  08/07/2015  CLINICAL DATA:  Altered mental status and shortness of breath today, vomiting yesterday per nurse's note. EXAM: PORTABLE CHEST 1 VIEW COMPARISON:  Chest  x-ray dated 08/05/2015. FINDINGS: Mild cardiomegaly is unchanged. There is new central pulmonary vascular congestion and bilateral interstitial edema. Moderate sized layering pleural effusion on the left  appears grossly stable, with probable adjacent atelectasis. There are acute displaced fractures of the lateral right sixth through eighth ribs, new compared to plain film of 08/05/2015 and chest CT 07/05/2015, with additional questionable slightly displaced fracture of the right posterior fifth rib. No left-sided rib fracture seen. IMPRESSION: 1. New displaced fractures of the right lateral sixth through eighth ribs, with questionable additional fracture of the posterior right fifth rib. 2. Cardiomegaly with central pulmonary vascular congestion and new bilateral interstitial edema suggesting volume overload/CHF. More confluent opacities at the right lung base suggest either confluent pulmonary edema or lung contusion (given the overlying rib fractures). 3. Moderate sized left pleural effusion, with probable associated atelectasis, appears stable. These results were called by telephone at the time of interpretation on 08/07/2015 at 10:31 am to Dr. Shirlyn Goltz , who verbally acknowledged these results. Electronically Signed   By: Franki Cabot M.D.   On: 08/07/2015 10:33   US Thoracentesis Asp Pleural Space W/img Guide  08/11/2015  CLINICAL DATA:  Multiple rib fractures after fall. Status post right thoracentesis yesterday. Left pleural effusion. Request for diagnostic and therapeutic left thoracentesis EXAM: ULTRASOUND GUIDED LEFT THORACENTESIS COMPARISON:  None. PROCEDURE: An ultrasound guided thoracentesis was thoroughly discussed with the patient and questions answered. The benefits, risks, alternatives and complications were also discussed. The patient understands and wishes to proceed with the procedure. Written consent was obtained. Ultrasound was performed to localize and mark an adequate pocket of fluid in the left chest. The area was then prepped and draped in the normal sterile fashion. 1% Lidocaine was used for local anesthesia. Under ultrasound guidance a Safe T Centesis catheter was introduced.  Thoracentesis was performed. The catheter was removed and a dressing applied. COMPLICATIONS: None immediate. FINDINGS: A total of approximately 500 mls of clear yellow fluid was removed. A fluid sample wassent for laboratory analysis. IMPRESSION: Successful ultrasound guided left thoracentesis yielding 500 mls of pleural fluid. Read by:  Gareth Eagle, PA-C Electronically Signed   By: Corrie Mckusick D.O.   On: 08/11/2015 15:00   US Thoracentesis Asp Pleural Space W/img Guide  08/10/2015  CLINICAL DATA:  Multiple rib fractures, flail chest, pneumonia EXAM: EXAM THORACENTESIS WITH ULTRASOUND TECHNIQUE: The procedure, risks (including but not limited to bleeding, infection, organ damage ), benefits, and alternatives were explained to the patient and son, power of attorney. Questions regarding the procedure were encouraged and answered. The patient understands and consents to the procedure. Survey ultrasound of the right hemithorax was performed and an appropriate skin entry site was localized. Site was marked, prepped with Betadine, draped in usual sterile fashion, infiltrated locally with 1% lidocaine. The Saf-T-Centesis needle was advanced into the pleural space. Blood-tinged fluid returned. A total of 1 L was removed. Sample sent for the requested laboratory studies. Post procedure imaging shows small amount of residual fluid. The patient tolerated procedure well. COMPLICATIONS: COMPLICATIONS None immediate IMPRESSION: Technically successful ultrasound-guided right thoracentesis. Follow-up chest radiograph pending. Electronically Signed   By: Lucrezia Europe M.D.   On: 08/10/2015 12:35    Microbiology: Recent Results (from the past 240 hour(s))  Urine culture     Status: None   Collection Time: 08/07/15  9:50 AM  Result Value Ref Range Status   Specimen Description URINE, CATHETERIZED  Final   Special Requests NONE  Final   Culture  Final    NO GROWTH 1 DAY Performed at [facility]    Report Status  08/08/2015 FINAL  Final  Blood culture (routine x 2)     Status: None   Collection Time: 08/07/15 10:43 AM  Result Value Ref Range Status   Specimen Description LEFT ANTECUBITAL  Final   Special Requests BOTTLES DRAWN AEROBIC AND ANAEROBIC 5CC  Final   Culture   Final    NO GROWTH 5 DAYS Performed at [facility]    Report Status 08/12/2015 FINAL  Final  Blood culture (routine x 2)     Status: None   Collection Time: 08/07/15 11:10 AM  Result Value Ref Range Status   Specimen Description RIGHT ANTECUBITAL  Final   Special Requests BOTTLES DRAWN AEROBIC AND ANAEROBIC 5CC  Final   Culture   Final    NO GROWTH 5 DAYS Performed at [facility]    Report Status 08/12/2015 FINAL  Final  MRSA PCR Screening     Status: None   Collection Time: 08/07/15  7:35 PM  Result Value Ref Range Status   MRSA by PCR NEGATIVE NEGATIVE Final    Comment:        The GeneXpert MRSA Assay (FDA approved for NASAL specimens only), is one component of a comprehensive MRSA colonization surveillance program. It is not intended to diagnose MRSA infection nor to guide or monitor treatment for MRSA infections.   Culture, body fluid-bottle     Status: None (Preliminary result)   Collection Time: 08/10/15 11:05 AM  Result Value Ref Range Status   Specimen Description FLUID PLEURAL RIGHT  Final   Special Requests NONE  Final   Culture NO GROWTH 2 DAYS  Final   Report Status PENDING  Incomplete  Gram stain     Status: None   Collection Time: 08/10/15 11:05 AM  Result Value Ref Range Status   Specimen Description FLUID PLEURAL RIGHT  Final   Special Requests NONE  Final   Gram Stain   Final    MODERATE WBC PRESENT,BOTH PMN AND MONONUCLEAR NO ORGANISMS SEEN    Report Status 08/10/2015 FINAL  Final  Culture, body fluid-bottle     Status: None (Preliminary result)   Collection Time: 08/11/15  2:15 PM  Result Value Ref Range Status   Specimen Description FLUID LEFT PLEURAL  Final    Special Requests NONE  Final   Culture NO GROWTH < 24 HOURS  Final   Report Status PENDING  Incomplete  Gram stain     Status: None   Collection Time: 08/11/15  2:15 PM  Result Value Ref Range Status   Specimen Description FLUID LEFT PLEURAL  Final   Special Requests NONE  Final   Gram Stain   Final    MODERATE WBC PRESENT,BOTH PMN AND MONONUCLEAR NO ORGANISMS SEEN    Report Status 08/11/2015 FINAL  Final     Labs: Basic Metabolic Panel:  Recent Labs Lab 08/07/15 0945 08/08/15 0517 08/09/15 0305 08/10/15 0324 08/11/15 0332  NA 140 139 136 134* 138  K 2.9* 2.8* 3.7 3.8 3.8  CL 98* 98* 100* 95* 99*  CO2 33* 33* 31 32 33*  GLUCOSE 147* 115* 119* 120* 111*  BUN 25* 15 13 16 20   CREATININE 1.06* 0.92 0.96 1.01* 1.06*  CALCIUM 10.9* 10.2 9.9 9.6 9.7  MG  --  1.7 1.7  --  2.3  PHOS  --  2.7  --   --   --  Liver Function Tests:  Recent Labs Lab 08/07/15 0945 08/08/15 0517 08/09/15 0305  AST 36 26 28  ALT 27 22 21   ALKPHOS 119 97 99  BILITOT 0.5 0.6 0.4  PROT 7.0 5.9* 5.5*  ALBUMIN 3.5 2.9* 2.7*    Recent Labs Lab 08/07/15 0945  LIPASE 25   No results for input(s): AMMONIA in the last 168 hours. CBC:  Recent Labs Lab 08/07/15 0945 08/08/15 0517 08/10/15 0324 08/11/15 0332  WBC 14.5* 12.0* 12.6* 11.2*  NEUTROABS 11.9* 9.6*  --   --   HGB 9.7* 9.2* 9.9* 9.6*  HCT 29.9* 28.1* 30.6* 30.6*  MCV 96.1 94.3 95.3 95.9  PLT 306 262 338 378   Cardiac Enzymes: No results for input(s): CKTOTAL, CKMB, CKMBINDEX, TROPONINI in the last 168 hours. BNP: BNP (last 3 results)  Recent Labs  07/06/15 0442  BNP 220.4*    ProBNP (last 3 results) No results for input(s): PROBNP in the last 8760 hours.  CBG:  Recent Labs Lab 08/08/15 1220 08/09/15 0808 08/10/15 0802 08/11/15 0734 08/12/15 0740  GLUCAP 125* 108* 113* 108* 114*       Signed:  Roshan Salamon  [facility] 08/12/2015, 2:42 PM

## 2015-08-12 NOTE — Progress Notes (Signed)
PT Cancellation Note  Patient Details Name: Linda [name] MRN: [id number] DOB: [birth year]   Cancelled Treatment:    Reason Eval/Treat Not Completed: Medical issues which prohibited therapy (Will sign off per CM and nurse.  Pt going home with Hospice care.)   Denice Paradise 08/12/2015, 10:03 AM [facility] [phone] (pager)

## 2015-08-12 NOTE — Progress Notes (Signed)
Transfer packet on chart  Patient will discharge to home Anticipated discharge date:12/9 Family notified: pt son Transportation by [facility]- RN to call when pt ready and son at home  [place] signing off.  Domenica Reamer, [place] Social Worker [phone]

## 2015-08-12 NOTE — Progress Notes (Addendum)
[address]       [place] [address]             [phone]     CARDIOTHORACIC SURGERY PROGRESS NOTE  Subjective: Looks bright and cheerful today.  Denies pain or SOB  Objective: Vital signs in last 24 hours: Temp:  [97.1 F (36.2 C)-98.5 F (36.9 C)] 97.1 F (36.2 C) (12/09 0743) Pulse Rate:  [69-88] 88 (12/09 1037) Cardiac Rhythm:  [-] Normal sinus rhythm (12/09 0746) Resp:  [15-40] 20 (12/09 0743) BP: (118-136)/(50-71) 127/65 mmHg (12/09 1037) SpO2:  [96 %-100 %] 98 % (12/09 0743) Weight:  [49.3 kg (108 lb 11 oz)] 49.3 kg (108 lb 11 oz) (12/09 0500)  Physical Exam:  Rhythm:   sinus  Breath sounds: Diminished at bases  Heart sounds:  RRR  Incisions:  n/a  Abdomen:  soft  Extremities:  warm   Intake/Output from previous day: 12/08 0701 - 12/09 0700 In: 1203 [P.O.:1200; I.V.:3] Out: -  Intake/Output this shift: Total I/O In: 360 [P.O.:360] Out: -   Lab Results:  Recent Labs  08/10/15 0324 08/11/15 0332  WBC 12.6* 11.2*  HGB 9.9* 9.6*  HCT 30.6* 30.6*  PLT 338 378   BMET:  Recent Labs  08/10/15 0324 08/11/15 0332  NA 134* 138  K 3.8 3.8  CL 95* 99*  CO2 32 33*  GLUCOSE 120* 111*  BUN 16 20  CREATININE 1.01* 1.06*  CALCIUM 9.6 9.7    CBG (last 3)   Recent Labs  08/10/15 0802 08/11/15 0734 08/12/15 0740  GLUCAP 113* 108* 114*   PT/INR:  No results for input(s): LABPROT, INR in the last 72 hours.  CXR:  CT CHEST WITHOUT CONTRAST  TECHNIQUE: Multidetector CT imaging of the chest was performed following the standard protocol without IV contrast.  COMPARISON: 08/07/2015 chest CT. 08/11/2015 chest radiograph.  FINDINGS: No enlarged axillary, mediastinal, or hilar lymph nodes are identified on this unenhanced study. The heart is normal in size. Mild aortic and coronary artery atherosclerotic calcification is noted. There is no pericardial effusion.  Moderate right pleural effusion has mildly increased in  size compared to the prior CT. A small left pleural effusion has decreased in size. There is a tiny right pneumothorax as seen on recent chest radiographs. There is compressive atelectasis in the right lung with complete right lower lobe collapse. Left lower lobe aeration has improved from the prior CT, with persistent atelectasis posteriorly and medially. 3 mm left upper lobe nodules are unchanged from 11/04/2007 and considered benign (series 4, images 16 and 26).  The visualized portion of the upper abdomen is unremarkable aside from vascular calcification. Fractures are again seen of the right fifth through tenth ribs, with multiple fractures being comminuted and with multiple ribs fractured in more than 1 location. There is unchanged, prominent inward displacement of the eighth and ninth rib fractures. Chronic T8 and T9 vertebral compression fractures are again seen status post augmentation. Mild L1 superior endplate compression fracture is unchanged and recent in appearance.  IMPRESSION: 1. Moderate right pleural effusion, mildly increased from the prior CT. Associated compressive atelectasis with complete right lower lobe collapse. 2. Small left pleural effusion, smaller than on the prior CT. Improved aeration of the left lower lobe. 3. Tiny right pneumothorax. 4. Numerous right rib fractures and mild L1 superior plate compression fracture as previously seen.   Electronically Signed  By: Logan Bores M.D.  On: 08/12/2015 08:08   Assessment/Plan:  Follow up CT reveals expected atelectasis of right lower lobe and what I would consider to be a relatively small residual right pleural effusion.  She will be at high risk for the development of pneumonia if she doesn't already have it, although so far she has remained afebrile with no productive cough and minimal leukocytosis.    I discussed her current condition at length with her son Jenny Reichmann over the telephone.  She should  not be restarted on Eliquis due to high risk of bleeding associated with multiple rib fractures and flail chest.  I would recommend ASA +/- Plavix only and not consider warfarin or NOAC.  She will unquestionably be at increased risk for thromboembolism and stroke, but risks of bleeding are too high at this time.  We also discussed mobility.  It is okay to get her up to a chair but this will likely require 2-person assist and/or Hoyer lift to minimize movement of her chest wall.  Overall prognosis is guarded at best with high risk for the development of pneumonia and/or respiratory failure.  If she develops re accumulation of a right pleural effusion we could consider chest tube or Pleur-X catheter placement for palliative purposes in the future.  I would be willing to see her in the office in 3-4 weeks for follow up if desired.  Okay for d/c from our standpoint once adequate home arrangements are in place.  I spent in excess of 30 minutes during the conduct of this hospital encounter and >50% of this time involved direct face-to-face encounter with the patient for counseling and/or coordination of their care.   Rexene Alberts, MD 08/12/2015 11:58 AM

## 2015-08-12 NOTE — Progress Notes (Signed)
Pt to be discharged to home per [facility] ambulance. Discharge papers sent with ambulance personnel. Pt only needs one more med tonight - circled in red and written on front of discharge. Pt unable to sign discharge - caregiver aware of her med due. Pt had tylenol po at 6 pm.

## 2015-08-12 NOTE — Progress Notes (Signed)
Pt discharged with [facility] 

## 2015-08-13 DIAGNOSIS — S299XXA Unspecified injury of thorax, initial encounter: Secondary | ICD-10-CM | POA: Diagnosis not present

## 2015-08-13 DIAGNOSIS — K219 Gastro-esophageal reflux disease without esophagitis: Secondary | ICD-10-CM | POA: Diagnosis not present

## 2015-08-13 DIAGNOSIS — D500 Iron deficiency anemia secondary to blood loss (chronic): Secondary | ICD-10-CM | POA: Diagnosis not present

## 2015-08-13 DIAGNOSIS — J9620 Acute and chronic respiratory failure, unspecified whether with hypoxia or hypercapnia: Secondary | ICD-10-CM | POA: Diagnosis not present

## 2015-08-13 DIAGNOSIS — S225XXB Flail chest, initial encounter for open fracture: Secondary | ICD-10-CM | POA: Diagnosis not present

## 2015-08-13 DIAGNOSIS — I679 Cerebrovascular disease, unspecified: Secondary | ICD-10-CM | POA: Diagnosis not present

## 2015-08-13 DIAGNOSIS — I5030 Unspecified diastolic (congestive) heart failure: Secondary | ICD-10-CM | POA: Diagnosis not present

## 2015-08-13 DIAGNOSIS — E039 Hypothyroidism, unspecified: Secondary | ICD-10-CM | POA: Diagnosis not present

## 2015-08-13 DIAGNOSIS — F339 Major depressive disorder, recurrent, unspecified: Secondary | ICD-10-CM | POA: Diagnosis not present

## 2015-08-13 DIAGNOSIS — E46 Unspecified protein-calorie malnutrition: Secondary | ICD-10-CM | POA: Diagnosis not present

## 2015-08-13 DIAGNOSIS — I4891 Unspecified atrial fibrillation: Secondary | ICD-10-CM | POA: Diagnosis not present

## 2015-08-13 DIAGNOSIS — J449 Chronic obstructive pulmonary disease, unspecified: Secondary | ICD-10-CM | POA: Diagnosis not present

## 2015-08-13 DIAGNOSIS — J189 Pneumonia, unspecified organism: Secondary | ICD-10-CM | POA: Diagnosis not present

## 2015-08-13 DIAGNOSIS — I10 Essential (primary) hypertension: Secondary | ICD-10-CM | POA: Diagnosis not present

## 2015-08-15 DIAGNOSIS — S225XXB Flail chest, initial encounter for open fracture: Secondary | ICD-10-CM | POA: Diagnosis not present

## 2015-08-15 DIAGNOSIS — I5030 Unspecified diastolic (congestive) heart failure: Secondary | ICD-10-CM | POA: Diagnosis not present

## 2015-08-15 DIAGNOSIS — S299XXA Unspecified injury of thorax, initial encounter: Secondary | ICD-10-CM | POA: Diagnosis not present

## 2015-08-15 DIAGNOSIS — J449 Chronic obstructive pulmonary disease, unspecified: Secondary | ICD-10-CM | POA: Diagnosis not present

## 2015-08-15 DIAGNOSIS — J189 Pneumonia, unspecified organism: Secondary | ICD-10-CM | POA: Diagnosis not present

## 2015-08-15 DIAGNOSIS — I679 Cerebrovascular disease, unspecified: Secondary | ICD-10-CM | POA: Diagnosis not present

## 2015-08-15 LAB — CULTURE, BODY FLUID-BOTTLE

## 2015-08-15 LAB — CULTURE, BODY FLUID W GRAM STAIN -BOTTLE: Culture: NO GROWTH

## 2015-08-16 DIAGNOSIS — S299XXA Unspecified injury of thorax, initial encounter: Secondary | ICD-10-CM | POA: Diagnosis not present

## 2015-08-16 DIAGNOSIS — S225XXB Flail chest, initial encounter for open fracture: Secondary | ICD-10-CM | POA: Diagnosis not present

## 2015-08-16 DIAGNOSIS — J449 Chronic obstructive pulmonary disease, unspecified: Secondary | ICD-10-CM | POA: Diagnosis not present

## 2015-08-16 DIAGNOSIS — J189 Pneumonia, unspecified organism: Secondary | ICD-10-CM | POA: Diagnosis not present

## 2015-08-16 DIAGNOSIS — I5030 Unspecified diastolic (congestive) heart failure: Secondary | ICD-10-CM | POA: Diagnosis not present

## 2015-08-16 DIAGNOSIS — I679 Cerebrovascular disease, unspecified: Secondary | ICD-10-CM | POA: Diagnosis not present

## 2015-08-16 LAB — CULTURE, BODY FLUID W GRAM STAIN -BOTTLE: Culture: NO GROWTH

## 2015-08-16 LAB — CULTURE, BODY FLUID-BOTTLE

## 2015-08-17 DIAGNOSIS — J449 Chronic obstructive pulmonary disease, unspecified: Secondary | ICD-10-CM | POA: Diagnosis not present

## 2015-08-17 DIAGNOSIS — I679 Cerebrovascular disease, unspecified: Secondary | ICD-10-CM | POA: Diagnosis not present

## 2015-08-17 DIAGNOSIS — J189 Pneumonia, unspecified organism: Secondary | ICD-10-CM | POA: Diagnosis not present

## 2015-08-17 DIAGNOSIS — S299XXA Unspecified injury of thorax, initial encounter: Secondary | ICD-10-CM | POA: Diagnosis not present

## 2015-08-17 DIAGNOSIS — S225XXB Flail chest, initial encounter for open fracture: Secondary | ICD-10-CM | POA: Diagnosis not present

## 2015-08-17 DIAGNOSIS — I5030 Unspecified diastolic (congestive) heart failure: Secondary | ICD-10-CM | POA: Diagnosis not present

## 2015-08-20 DIAGNOSIS — I679 Cerebrovascular disease, unspecified: Secondary | ICD-10-CM | POA: Diagnosis not present

## 2015-08-20 DIAGNOSIS — J449 Chronic obstructive pulmonary disease, unspecified: Secondary | ICD-10-CM | POA: Diagnosis not present

## 2015-08-20 DIAGNOSIS — S225XXB Flail chest, initial encounter for open fracture: Secondary | ICD-10-CM | POA: Diagnosis not present

## 2015-08-20 DIAGNOSIS — S299XXA Unspecified injury of thorax, initial encounter: Secondary | ICD-10-CM | POA: Diagnosis not present

## 2015-08-20 DIAGNOSIS — I5030 Unspecified diastolic (congestive) heart failure: Secondary | ICD-10-CM | POA: Diagnosis not present

## 2015-08-20 DIAGNOSIS — J189 Pneumonia, unspecified organism: Secondary | ICD-10-CM | POA: Diagnosis not present

## 2015-08-23 DIAGNOSIS — J449 Chronic obstructive pulmonary disease, unspecified: Secondary | ICD-10-CM | POA: Diagnosis not present

## 2015-08-23 DIAGNOSIS — I679 Cerebrovascular disease, unspecified: Secondary | ICD-10-CM | POA: Diagnosis not present

## 2015-08-23 DIAGNOSIS — S225XXB Flail chest, initial encounter for open fracture: Secondary | ICD-10-CM | POA: Diagnosis not present

## 2015-08-23 DIAGNOSIS — J189 Pneumonia, unspecified organism: Secondary | ICD-10-CM | POA: Diagnosis not present

## 2015-08-23 DIAGNOSIS — I5030 Unspecified diastolic (congestive) heart failure: Secondary | ICD-10-CM | POA: Diagnosis not present

## 2015-08-23 DIAGNOSIS — S299XXA Unspecified injury of thorax, initial encounter: Secondary | ICD-10-CM | POA: Diagnosis not present

## 2015-08-25 DIAGNOSIS — S299XXA Unspecified injury of thorax, initial encounter: Secondary | ICD-10-CM | POA: Diagnosis not present

## 2015-08-25 DIAGNOSIS — I679 Cerebrovascular disease, unspecified: Secondary | ICD-10-CM | POA: Diagnosis not present

## 2015-08-25 DIAGNOSIS — J189 Pneumonia, unspecified organism: Secondary | ICD-10-CM | POA: Diagnosis not present

## 2015-08-25 DIAGNOSIS — S225XXB Flail chest, initial encounter for open fracture: Secondary | ICD-10-CM | POA: Diagnosis not present

## 2015-08-25 DIAGNOSIS — I5030 Unspecified diastolic (congestive) heart failure: Secondary | ICD-10-CM | POA: Diagnosis not present

## 2015-08-25 DIAGNOSIS — J449 Chronic obstructive pulmonary disease, unspecified: Secondary | ICD-10-CM | POA: Diagnosis not present

## 2015-08-26 DIAGNOSIS — S225XXB Flail chest, initial encounter for open fracture: Secondary | ICD-10-CM | POA: Diagnosis not present

## 2015-08-26 DIAGNOSIS — I5030 Unspecified diastolic (congestive) heart failure: Secondary | ICD-10-CM | POA: Diagnosis not present

## 2015-08-26 DIAGNOSIS — I679 Cerebrovascular disease, unspecified: Secondary | ICD-10-CM | POA: Diagnosis not present

## 2015-08-26 DIAGNOSIS — S299XXA Unspecified injury of thorax, initial encounter: Secondary | ICD-10-CM | POA: Diagnosis not present

## 2015-08-26 DIAGNOSIS — J449 Chronic obstructive pulmonary disease, unspecified: Secondary | ICD-10-CM | POA: Diagnosis not present

## 2015-08-26 DIAGNOSIS — J189 Pneumonia, unspecified organism: Secondary | ICD-10-CM | POA: Diagnosis not present

## 2015-08-29 DIAGNOSIS — S225XXB Flail chest, initial encounter for open fracture: Secondary | ICD-10-CM | POA: Diagnosis not present

## 2015-08-29 DIAGNOSIS — S299XXA Unspecified injury of thorax, initial encounter: Secondary | ICD-10-CM | POA: Diagnosis not present

## 2015-08-29 DIAGNOSIS — J449 Chronic obstructive pulmonary disease, unspecified: Secondary | ICD-10-CM | POA: Diagnosis not present

## 2015-08-29 DIAGNOSIS — I679 Cerebrovascular disease, unspecified: Secondary | ICD-10-CM | POA: Diagnosis not present

## 2015-08-29 DIAGNOSIS — I5030 Unspecified diastolic (congestive) heart failure: Secondary | ICD-10-CM | POA: Diagnosis not present

## 2015-08-29 DIAGNOSIS — J189 Pneumonia, unspecified organism: Secondary | ICD-10-CM | POA: Diagnosis not present

## 2015-08-30 DIAGNOSIS — S299XXA Unspecified injury of thorax, initial encounter: Secondary | ICD-10-CM | POA: Diagnosis not present

## 2015-08-30 DIAGNOSIS — S225XXB Flail chest, initial encounter for open fracture: Secondary | ICD-10-CM | POA: Diagnosis not present

## 2015-08-30 DIAGNOSIS — I5030 Unspecified diastolic (congestive) heart failure: Secondary | ICD-10-CM | POA: Diagnosis not present

## 2015-08-30 DIAGNOSIS — I679 Cerebrovascular disease, unspecified: Secondary | ICD-10-CM | POA: Diagnosis not present

## 2015-08-30 DIAGNOSIS — J189 Pneumonia, unspecified organism: Secondary | ICD-10-CM | POA: Diagnosis not present

## 2015-08-30 DIAGNOSIS — J449 Chronic obstructive pulmonary disease, unspecified: Secondary | ICD-10-CM | POA: Diagnosis not present

## 2015-09-04 DIAGNOSIS — I5030 Unspecified diastolic (congestive) heart failure: Secondary | ICD-10-CM | POA: Diagnosis not present

## 2015-09-04 DIAGNOSIS — D500 Iron deficiency anemia secondary to blood loss (chronic): Secondary | ICD-10-CM | POA: Diagnosis not present

## 2015-09-04 DIAGNOSIS — J449 Chronic obstructive pulmonary disease, unspecified: Secondary | ICD-10-CM | POA: Diagnosis not present

## 2015-09-04 DIAGNOSIS — S299XXA Unspecified injury of thorax, initial encounter: Secondary | ICD-10-CM | POA: Diagnosis not present

## 2015-09-04 DIAGNOSIS — K219 Gastro-esophageal reflux disease without esophagitis: Secondary | ICD-10-CM | POA: Diagnosis not present

## 2015-09-04 DIAGNOSIS — I10 Essential (primary) hypertension: Secondary | ICD-10-CM | POA: Diagnosis not present

## 2015-09-04 DIAGNOSIS — S225XXB Flail chest, initial encounter for open fracture: Secondary | ICD-10-CM | POA: Diagnosis not present

## 2015-09-04 DIAGNOSIS — J189 Pneumonia, unspecified organism: Secondary | ICD-10-CM | POA: Diagnosis not present

## 2015-09-04 DIAGNOSIS — F339 Major depressive disorder, recurrent, unspecified: Secondary | ICD-10-CM | POA: Diagnosis not present

## 2015-09-04 DIAGNOSIS — J9620 Acute and chronic respiratory failure, unspecified whether with hypoxia or hypercapnia: Secondary | ICD-10-CM | POA: Diagnosis not present

## 2015-09-04 DIAGNOSIS — E039 Hypothyroidism, unspecified: Secondary | ICD-10-CM | POA: Diagnosis not present

## 2015-09-04 DIAGNOSIS — I4891 Unspecified atrial fibrillation: Secondary | ICD-10-CM | POA: Diagnosis not present

## 2015-09-04 DIAGNOSIS — I679 Cerebrovascular disease, unspecified: Secondary | ICD-10-CM | POA: Diagnosis not present

## 2015-09-04 DIAGNOSIS — E46 Unspecified protein-calorie malnutrition: Secondary | ICD-10-CM | POA: Diagnosis not present

## 2015-09-05 DIAGNOSIS — I679 Cerebrovascular disease, unspecified: Secondary | ICD-10-CM | POA: Diagnosis not present

## 2015-09-05 DIAGNOSIS — S225XXB Flail chest, initial encounter for open fracture: Secondary | ICD-10-CM | POA: Diagnosis not present

## 2015-09-05 DIAGNOSIS — J449 Chronic obstructive pulmonary disease, unspecified: Secondary | ICD-10-CM | POA: Diagnosis not present

## 2015-09-05 DIAGNOSIS — I5030 Unspecified diastolic (congestive) heart failure: Secondary | ICD-10-CM | POA: Diagnosis not present

## 2015-09-05 DIAGNOSIS — J189 Pneumonia, unspecified organism: Secondary | ICD-10-CM | POA: Diagnosis not present

## 2015-09-05 DIAGNOSIS — S299XXA Unspecified injury of thorax, initial encounter: Secondary | ICD-10-CM | POA: Diagnosis not present

## 2015-09-06 DIAGNOSIS — J189 Pneumonia, unspecified organism: Secondary | ICD-10-CM | POA: Diagnosis not present

## 2015-09-06 DIAGNOSIS — S225XXB Flail chest, initial encounter for open fracture: Secondary | ICD-10-CM | POA: Diagnosis not present

## 2015-09-06 DIAGNOSIS — I679 Cerebrovascular disease, unspecified: Secondary | ICD-10-CM | POA: Diagnosis not present

## 2015-09-06 DIAGNOSIS — J449 Chronic obstructive pulmonary disease, unspecified: Secondary | ICD-10-CM | POA: Diagnosis not present

## 2015-09-06 DIAGNOSIS — S299XXA Unspecified injury of thorax, initial encounter: Secondary | ICD-10-CM | POA: Diagnosis not present

## 2015-09-06 DIAGNOSIS — I5030 Unspecified diastolic (congestive) heart failure: Secondary | ICD-10-CM | POA: Diagnosis not present

## 2015-09-10 DIAGNOSIS — S225XXB Flail chest, initial encounter for open fracture: Secondary | ICD-10-CM | POA: Diagnosis not present

## 2015-09-10 DIAGNOSIS — I679 Cerebrovascular disease, unspecified: Secondary | ICD-10-CM | POA: Diagnosis not present

## 2015-09-10 DIAGNOSIS — I5030 Unspecified diastolic (congestive) heart failure: Secondary | ICD-10-CM | POA: Diagnosis not present

## 2015-09-10 DIAGNOSIS — J449 Chronic obstructive pulmonary disease, unspecified: Secondary | ICD-10-CM | POA: Diagnosis not present

## 2015-09-10 DIAGNOSIS — J189 Pneumonia, unspecified organism: Secondary | ICD-10-CM | POA: Diagnosis not present

## 2015-09-10 DIAGNOSIS — S299XXA Unspecified injury of thorax, initial encounter: Secondary | ICD-10-CM | POA: Diagnosis not present

## 2015-09-13 DIAGNOSIS — I5030 Unspecified diastolic (congestive) heart failure: Secondary | ICD-10-CM | POA: Diagnosis not present

## 2015-09-13 DIAGNOSIS — J189 Pneumonia, unspecified organism: Secondary | ICD-10-CM | POA: Diagnosis not present

## 2015-09-13 DIAGNOSIS — J449 Chronic obstructive pulmonary disease, unspecified: Secondary | ICD-10-CM | POA: Diagnosis not present

## 2015-09-13 DIAGNOSIS — I679 Cerebrovascular disease, unspecified: Secondary | ICD-10-CM | POA: Diagnosis not present

## 2015-09-13 DIAGNOSIS — S299XXA Unspecified injury of thorax, initial encounter: Secondary | ICD-10-CM | POA: Diagnosis not present

## 2015-09-13 DIAGNOSIS — S225XXB Flail chest, initial encounter for open fracture: Secondary | ICD-10-CM | POA: Diagnosis not present

## 2015-09-15 DIAGNOSIS — S225XXB Flail chest, initial encounter for open fracture: Secondary | ICD-10-CM | POA: Diagnosis not present

## 2015-09-15 DIAGNOSIS — I5030 Unspecified diastolic (congestive) heart failure: Secondary | ICD-10-CM | POA: Diagnosis not present

## 2015-09-15 DIAGNOSIS — I679 Cerebrovascular disease, unspecified: Secondary | ICD-10-CM | POA: Diagnosis not present

## 2015-09-15 DIAGNOSIS — J449 Chronic obstructive pulmonary disease, unspecified: Secondary | ICD-10-CM | POA: Diagnosis not present

## 2015-09-15 DIAGNOSIS — J189 Pneumonia, unspecified organism: Secondary | ICD-10-CM | POA: Diagnosis not present

## 2015-09-15 DIAGNOSIS — S299XXA Unspecified injury of thorax, initial encounter: Secondary | ICD-10-CM | POA: Diagnosis not present

## 2015-09-16 ENCOUNTER — Other Ambulatory Visit: Payer: Self-pay | Admitting: Thoracic Surgery (Cardiothoracic Vascular Surgery)

## 2015-09-16 DIAGNOSIS — I480 Paroxysmal atrial fibrillation: Secondary | ICD-10-CM

## 2015-09-19 ENCOUNTER — Ambulatory Visit: Payer: Medicare Other | Admitting: Thoracic Surgery (Cardiothoracic Vascular Surgery)

## 2015-09-19 DIAGNOSIS — I679 Cerebrovascular disease, unspecified: Secondary | ICD-10-CM | POA: Diagnosis not present

## 2015-09-19 DIAGNOSIS — J189 Pneumonia, unspecified organism: Secondary | ICD-10-CM | POA: Diagnosis not present

## 2015-09-19 DIAGNOSIS — I5030 Unspecified diastolic (congestive) heart failure: Secondary | ICD-10-CM | POA: Diagnosis not present

## 2015-09-19 DIAGNOSIS — S225XXB Flail chest, initial encounter for open fracture: Secondary | ICD-10-CM | POA: Diagnosis not present

## 2015-09-19 DIAGNOSIS — S299XXA Unspecified injury of thorax, initial encounter: Secondary | ICD-10-CM | POA: Diagnosis not present

## 2015-09-19 DIAGNOSIS — J449 Chronic obstructive pulmonary disease, unspecified: Secondary | ICD-10-CM | POA: Diagnosis not present

## 2015-09-20 DIAGNOSIS — J189 Pneumonia, unspecified organism: Secondary | ICD-10-CM | POA: Diagnosis not present

## 2015-09-20 DIAGNOSIS — S225XXB Flail chest, initial encounter for open fracture: Secondary | ICD-10-CM | POA: Diagnosis not present

## 2015-09-20 DIAGNOSIS — S299XXA Unspecified injury of thorax, initial encounter: Secondary | ICD-10-CM | POA: Diagnosis not present

## 2015-09-20 DIAGNOSIS — I5030 Unspecified diastolic (congestive) heart failure: Secondary | ICD-10-CM | POA: Diagnosis not present

## 2015-09-20 DIAGNOSIS — I679 Cerebrovascular disease, unspecified: Secondary | ICD-10-CM | POA: Diagnosis not present

## 2015-09-20 DIAGNOSIS — J449 Chronic obstructive pulmonary disease, unspecified: Secondary | ICD-10-CM | POA: Diagnosis not present

## 2015-09-26 DIAGNOSIS — I679 Cerebrovascular disease, unspecified: Secondary | ICD-10-CM | POA: Diagnosis not present

## 2015-09-26 DIAGNOSIS — J189 Pneumonia, unspecified organism: Secondary | ICD-10-CM | POA: Diagnosis not present

## 2015-09-26 DIAGNOSIS — J449 Chronic obstructive pulmonary disease, unspecified: Secondary | ICD-10-CM | POA: Diagnosis not present

## 2015-09-26 DIAGNOSIS — S225XXB Flail chest, initial encounter for open fracture: Secondary | ICD-10-CM | POA: Diagnosis not present

## 2015-09-26 DIAGNOSIS — S299XXA Unspecified injury of thorax, initial encounter: Secondary | ICD-10-CM | POA: Diagnosis not present

## 2015-09-26 DIAGNOSIS — I5030 Unspecified diastolic (congestive) heart failure: Secondary | ICD-10-CM | POA: Diagnosis not present

## 2015-09-27 ENCOUNTER — Telehealth: Payer: Self-pay | Admitting: Certified Nurse Midwife

## 2015-09-27 DIAGNOSIS — S299XXA Unspecified injury of thorax, initial encounter: Secondary | ICD-10-CM | POA: Diagnosis not present

## 2015-09-27 DIAGNOSIS — I679 Cerebrovascular disease, unspecified: Secondary | ICD-10-CM | POA: Diagnosis not present

## 2015-09-27 DIAGNOSIS — J449 Chronic obstructive pulmonary disease, unspecified: Secondary | ICD-10-CM | POA: Diagnosis not present

## 2015-09-27 DIAGNOSIS — I5030 Unspecified diastolic (congestive) heart failure: Secondary | ICD-10-CM | POA: Diagnosis not present

## 2015-09-27 DIAGNOSIS — J189 Pneumonia, unspecified organism: Secondary | ICD-10-CM | POA: Diagnosis not present

## 2015-09-27 DIAGNOSIS — S225XXB Flail chest, initial encounter for open fracture: Secondary | ICD-10-CM | POA: Diagnosis not present

## 2015-09-27 NOTE — Telephone Encounter (Signed)
I called patient to confirm appointment due to cancellation via automated reminder call. Patient's CNA answered the phone and said "Linda [name] is now a hospice patient and will no longer need this appointment. I canceled her appointment and removed her recall.

## 2015-09-29 ENCOUNTER — Ambulatory Visit: Payer: Medicare Other | Admitting: Certified Nurse Midwife

## 2015-09-29 DIAGNOSIS — I5030 Unspecified diastolic (congestive) heart failure: Secondary | ICD-10-CM | POA: Diagnosis not present

## 2015-09-29 DIAGNOSIS — J189 Pneumonia, unspecified organism: Secondary | ICD-10-CM | POA: Diagnosis not present

## 2015-09-29 DIAGNOSIS — I679 Cerebrovascular disease, unspecified: Secondary | ICD-10-CM | POA: Diagnosis not present

## 2015-09-29 DIAGNOSIS — S225XXB Flail chest, initial encounter for open fracture: Secondary | ICD-10-CM | POA: Diagnosis not present

## 2015-09-29 DIAGNOSIS — J449 Chronic obstructive pulmonary disease, unspecified: Secondary | ICD-10-CM | POA: Diagnosis not present

## 2015-09-29 DIAGNOSIS — S299XXA Unspecified injury of thorax, initial encounter: Secondary | ICD-10-CM | POA: Diagnosis not present

## 2015-09-30 DIAGNOSIS — I679 Cerebrovascular disease, unspecified: Secondary | ICD-10-CM | POA: Diagnosis not present

## 2015-09-30 DIAGNOSIS — J449 Chronic obstructive pulmonary disease, unspecified: Secondary | ICD-10-CM | POA: Diagnosis not present

## 2015-09-30 DIAGNOSIS — S299XXA Unspecified injury of thorax, initial encounter: Secondary | ICD-10-CM | POA: Diagnosis not present

## 2015-09-30 DIAGNOSIS — S225XXB Flail chest, initial encounter for open fracture: Secondary | ICD-10-CM | POA: Diagnosis not present

## 2015-09-30 DIAGNOSIS — I5030 Unspecified diastolic (congestive) heart failure: Secondary | ICD-10-CM | POA: Diagnosis not present

## 2015-09-30 DIAGNOSIS — J189 Pneumonia, unspecified organism: Secondary | ICD-10-CM | POA: Diagnosis not present

## 2015-10-04 DIAGNOSIS — I679 Cerebrovascular disease, unspecified: Secondary | ICD-10-CM | POA: Diagnosis not present

## 2015-10-04 DIAGNOSIS — J189 Pneumonia, unspecified organism: Secondary | ICD-10-CM | POA: Diagnosis not present

## 2015-10-04 DIAGNOSIS — I5030 Unspecified diastolic (congestive) heart failure: Secondary | ICD-10-CM | POA: Diagnosis not present

## 2015-10-04 DIAGNOSIS — S225XXB Flail chest, initial encounter for open fracture: Secondary | ICD-10-CM | POA: Diagnosis not present

## 2015-10-04 DIAGNOSIS — S299XXA Unspecified injury of thorax, initial encounter: Secondary | ICD-10-CM | POA: Diagnosis not present

## 2015-10-04 DIAGNOSIS — J449 Chronic obstructive pulmonary disease, unspecified: Secondary | ICD-10-CM | POA: Diagnosis not present

## 2015-10-05 DIAGNOSIS — I4891 Unspecified atrial fibrillation: Secondary | ICD-10-CM | POA: Diagnosis not present

## 2015-10-05 DIAGNOSIS — J189 Pneumonia, unspecified organism: Secondary | ICD-10-CM | POA: Diagnosis not present

## 2015-10-05 DIAGNOSIS — S225XXB Flail chest, initial encounter for open fracture: Secondary | ICD-10-CM | POA: Diagnosis not present

## 2015-10-05 DIAGNOSIS — I5030 Unspecified diastolic (congestive) heart failure: Secondary | ICD-10-CM | POA: Diagnosis not present

## 2015-10-05 DIAGNOSIS — F339 Major depressive disorder, recurrent, unspecified: Secondary | ICD-10-CM | POA: Diagnosis not present

## 2015-10-05 DIAGNOSIS — E46 Unspecified protein-calorie malnutrition: Secondary | ICD-10-CM | POA: Diagnosis not present

## 2015-10-05 DIAGNOSIS — I10 Essential (primary) hypertension: Secondary | ICD-10-CM | POA: Diagnosis not present

## 2015-10-05 DIAGNOSIS — D500 Iron deficiency anemia secondary to blood loss (chronic): Secondary | ICD-10-CM | POA: Diagnosis not present

## 2015-10-05 DIAGNOSIS — I679 Cerebrovascular disease, unspecified: Secondary | ICD-10-CM | POA: Diagnosis not present

## 2015-10-05 DIAGNOSIS — J9620 Acute and chronic respiratory failure, unspecified whether with hypoxia or hypercapnia: Secondary | ICD-10-CM | POA: Diagnosis not present

## 2015-10-05 DIAGNOSIS — K219 Gastro-esophageal reflux disease without esophagitis: Secondary | ICD-10-CM | POA: Diagnosis not present

## 2015-10-05 DIAGNOSIS — S299XXA Unspecified injury of thorax, initial encounter: Secondary | ICD-10-CM | POA: Diagnosis not present

## 2015-10-05 DIAGNOSIS — J449 Chronic obstructive pulmonary disease, unspecified: Secondary | ICD-10-CM | POA: Diagnosis not present

## 2015-10-05 DIAGNOSIS — E039 Hypothyroidism, unspecified: Secondary | ICD-10-CM | POA: Diagnosis not present

## 2015-10-06 DIAGNOSIS — S299XXA Unspecified injury of thorax, initial encounter: Secondary | ICD-10-CM | POA: Diagnosis not present

## 2015-10-06 DIAGNOSIS — I5030 Unspecified diastolic (congestive) heart failure: Secondary | ICD-10-CM | POA: Diagnosis not present

## 2015-10-06 DIAGNOSIS — J189 Pneumonia, unspecified organism: Secondary | ICD-10-CM | POA: Diagnosis not present

## 2015-10-06 DIAGNOSIS — S225XXB Flail chest, initial encounter for open fracture: Secondary | ICD-10-CM | POA: Diagnosis not present

## 2015-10-06 DIAGNOSIS — J449 Chronic obstructive pulmonary disease, unspecified: Secondary | ICD-10-CM | POA: Diagnosis not present

## 2015-10-06 DIAGNOSIS — I679 Cerebrovascular disease, unspecified: Secondary | ICD-10-CM | POA: Diagnosis not present

## 2015-10-09 DIAGNOSIS — J189 Pneumonia, unspecified organism: Secondary | ICD-10-CM | POA: Diagnosis not present

## 2015-10-09 DIAGNOSIS — I679 Cerebrovascular disease, unspecified: Secondary | ICD-10-CM | POA: Diagnosis not present

## 2015-10-09 DIAGNOSIS — S299XXA Unspecified injury of thorax, initial encounter: Secondary | ICD-10-CM | POA: Diagnosis not present

## 2015-10-09 DIAGNOSIS — I5030 Unspecified diastolic (congestive) heart failure: Secondary | ICD-10-CM | POA: Diagnosis not present

## 2015-10-09 DIAGNOSIS — J449 Chronic obstructive pulmonary disease, unspecified: Secondary | ICD-10-CM | POA: Diagnosis not present

## 2015-10-09 DIAGNOSIS — S225XXB Flail chest, initial encounter for open fracture: Secondary | ICD-10-CM | POA: Diagnosis not present

## 2015-10-11 DIAGNOSIS — S299XXA Unspecified injury of thorax, initial encounter: Secondary | ICD-10-CM | POA: Diagnosis not present

## 2015-10-11 DIAGNOSIS — J189 Pneumonia, unspecified organism: Secondary | ICD-10-CM | POA: Diagnosis not present

## 2015-10-11 DIAGNOSIS — S225XXB Flail chest, initial encounter for open fracture: Secondary | ICD-10-CM | POA: Diagnosis not present

## 2015-10-11 DIAGNOSIS — I679 Cerebrovascular disease, unspecified: Secondary | ICD-10-CM | POA: Diagnosis not present

## 2015-10-11 DIAGNOSIS — I5030 Unspecified diastolic (congestive) heart failure: Secondary | ICD-10-CM | POA: Diagnosis not present

## 2015-10-11 DIAGNOSIS — J449 Chronic obstructive pulmonary disease, unspecified: Secondary | ICD-10-CM | POA: Diagnosis not present

## 2015-10-17 DIAGNOSIS — I5030 Unspecified diastolic (congestive) heart failure: Secondary | ICD-10-CM | POA: Diagnosis not present

## 2015-10-17 DIAGNOSIS — S225XXB Flail chest, initial encounter for open fracture: Secondary | ICD-10-CM | POA: Diagnosis not present

## 2015-10-17 DIAGNOSIS — J189 Pneumonia, unspecified organism: Secondary | ICD-10-CM | POA: Diagnosis not present

## 2015-10-17 DIAGNOSIS — I679 Cerebrovascular disease, unspecified: Secondary | ICD-10-CM | POA: Diagnosis not present

## 2015-10-17 DIAGNOSIS — J449 Chronic obstructive pulmonary disease, unspecified: Secondary | ICD-10-CM | POA: Diagnosis not present

## 2015-10-17 DIAGNOSIS — S299XXA Unspecified injury of thorax, initial encounter: Secondary | ICD-10-CM | POA: Diagnosis not present

## 2015-10-18 DIAGNOSIS — I5030 Unspecified diastolic (congestive) heart failure: Secondary | ICD-10-CM | POA: Diagnosis not present

## 2015-10-18 DIAGNOSIS — S225XXB Flail chest, initial encounter for open fracture: Secondary | ICD-10-CM | POA: Diagnosis not present

## 2015-10-18 DIAGNOSIS — J449 Chronic obstructive pulmonary disease, unspecified: Secondary | ICD-10-CM | POA: Diagnosis not present

## 2015-10-18 DIAGNOSIS — J189 Pneumonia, unspecified organism: Secondary | ICD-10-CM | POA: Diagnosis not present

## 2015-10-18 DIAGNOSIS — I679 Cerebrovascular disease, unspecified: Secondary | ICD-10-CM | POA: Diagnosis not present

## 2015-10-18 DIAGNOSIS — S299XXA Unspecified injury of thorax, initial encounter: Secondary | ICD-10-CM | POA: Diagnosis not present

## 2015-10-19 DIAGNOSIS — I679 Cerebrovascular disease, unspecified: Secondary | ICD-10-CM | POA: Diagnosis not present

## 2015-10-19 DIAGNOSIS — S299XXA Unspecified injury of thorax, initial encounter: Secondary | ICD-10-CM | POA: Diagnosis not present

## 2015-10-19 DIAGNOSIS — I5030 Unspecified diastolic (congestive) heart failure: Secondary | ICD-10-CM | POA: Diagnosis not present

## 2015-10-19 DIAGNOSIS — S225XXB Flail chest, initial encounter for open fracture: Secondary | ICD-10-CM | POA: Diagnosis not present

## 2015-10-19 DIAGNOSIS — J189 Pneumonia, unspecified organism: Secondary | ICD-10-CM | POA: Diagnosis not present

## 2015-10-19 DIAGNOSIS — J449 Chronic obstructive pulmonary disease, unspecified: Secondary | ICD-10-CM | POA: Diagnosis not present

## 2015-10-20 DIAGNOSIS — S299XXA Unspecified injury of thorax, initial encounter: Secondary | ICD-10-CM | POA: Diagnosis not present

## 2015-10-20 DIAGNOSIS — S225XXB Flail chest, initial encounter for open fracture: Secondary | ICD-10-CM | POA: Diagnosis not present

## 2015-10-20 DIAGNOSIS — I5030 Unspecified diastolic (congestive) heart failure: Secondary | ICD-10-CM | POA: Diagnosis not present

## 2015-10-20 DIAGNOSIS — I679 Cerebrovascular disease, unspecified: Secondary | ICD-10-CM | POA: Diagnosis not present

## 2015-10-20 DIAGNOSIS — J449 Chronic obstructive pulmonary disease, unspecified: Secondary | ICD-10-CM | POA: Diagnosis not present

## 2015-10-20 DIAGNOSIS — J189 Pneumonia, unspecified organism: Secondary | ICD-10-CM | POA: Diagnosis not present

## 2015-10-22 DIAGNOSIS — J189 Pneumonia, unspecified organism: Secondary | ICD-10-CM | POA: Diagnosis not present

## 2015-10-22 DIAGNOSIS — I679 Cerebrovascular disease, unspecified: Secondary | ICD-10-CM | POA: Diagnosis not present

## 2015-10-22 DIAGNOSIS — S299XXA Unspecified injury of thorax, initial encounter: Secondary | ICD-10-CM | POA: Diagnosis not present

## 2015-10-22 DIAGNOSIS — S225XXB Flail chest, initial encounter for open fracture: Secondary | ICD-10-CM | POA: Diagnosis not present

## 2015-10-22 DIAGNOSIS — I5030 Unspecified diastolic (congestive) heart failure: Secondary | ICD-10-CM | POA: Diagnosis not present

## 2015-10-22 DIAGNOSIS — J449 Chronic obstructive pulmonary disease, unspecified: Secondary | ICD-10-CM | POA: Diagnosis not present

## 2015-10-24 DIAGNOSIS — J449 Chronic obstructive pulmonary disease, unspecified: Secondary | ICD-10-CM | POA: Diagnosis not present

## 2015-10-24 DIAGNOSIS — S225XXB Flail chest, initial encounter for open fracture: Secondary | ICD-10-CM | POA: Diagnosis not present

## 2015-10-24 DIAGNOSIS — J189 Pneumonia, unspecified organism: Secondary | ICD-10-CM | POA: Diagnosis not present

## 2015-10-24 DIAGNOSIS — S299XXA Unspecified injury of thorax, initial encounter: Secondary | ICD-10-CM | POA: Diagnosis not present

## 2015-10-24 DIAGNOSIS — I679 Cerebrovascular disease, unspecified: Secondary | ICD-10-CM | POA: Diagnosis not present

## 2015-10-24 DIAGNOSIS — I5030 Unspecified diastolic (congestive) heart failure: Secondary | ICD-10-CM | POA: Diagnosis not present

## 2015-10-25 DIAGNOSIS — I5030 Unspecified diastolic (congestive) heart failure: Secondary | ICD-10-CM | POA: Diagnosis not present

## 2015-10-25 DIAGNOSIS — J449 Chronic obstructive pulmonary disease, unspecified: Secondary | ICD-10-CM | POA: Diagnosis not present

## 2015-10-25 DIAGNOSIS — I679 Cerebrovascular disease, unspecified: Secondary | ICD-10-CM | POA: Diagnosis not present

## 2015-10-25 DIAGNOSIS — S225XXB Flail chest, initial encounter for open fracture: Secondary | ICD-10-CM | POA: Diagnosis not present

## 2015-10-25 DIAGNOSIS — J189 Pneumonia, unspecified organism: Secondary | ICD-10-CM | POA: Diagnosis not present

## 2015-10-25 DIAGNOSIS — S299XXA Unspecified injury of thorax, initial encounter: Secondary | ICD-10-CM | POA: Diagnosis not present

## 2015-11-01 DIAGNOSIS — I679 Cerebrovascular disease, unspecified: Secondary | ICD-10-CM | POA: Diagnosis not present

## 2015-11-01 DIAGNOSIS — J189 Pneumonia, unspecified organism: Secondary | ICD-10-CM | POA: Diagnosis not present

## 2015-11-01 DIAGNOSIS — J449 Chronic obstructive pulmonary disease, unspecified: Secondary | ICD-10-CM | POA: Diagnosis not present

## 2015-11-01 DIAGNOSIS — I5030 Unspecified diastolic (congestive) heart failure: Secondary | ICD-10-CM | POA: Diagnosis not present

## 2015-11-01 DIAGNOSIS — S299XXA Unspecified injury of thorax, initial encounter: Secondary | ICD-10-CM | POA: Diagnosis not present

## 2015-11-01 DIAGNOSIS — S225XXB Flail chest, initial encounter for open fracture: Secondary | ICD-10-CM | POA: Diagnosis not present

## 2015-11-02 DIAGNOSIS — J9620 Acute and chronic respiratory failure, unspecified whether with hypoxia or hypercapnia: Secondary | ICD-10-CM | POA: Diagnosis not present

## 2015-11-02 DIAGNOSIS — K219 Gastro-esophageal reflux disease without esophagitis: Secondary | ICD-10-CM | POA: Diagnosis not present

## 2015-11-02 DIAGNOSIS — D500 Iron deficiency anemia secondary to blood loss (chronic): Secondary | ICD-10-CM | POA: Diagnosis not present

## 2015-11-02 DIAGNOSIS — S299XXA Unspecified injury of thorax, initial encounter: Secondary | ICD-10-CM | POA: Diagnosis not present

## 2015-11-02 DIAGNOSIS — I5030 Unspecified diastolic (congestive) heart failure: Secondary | ICD-10-CM | POA: Diagnosis not present

## 2015-11-02 DIAGNOSIS — E46 Unspecified protein-calorie malnutrition: Secondary | ICD-10-CM | POA: Diagnosis not present

## 2015-11-02 DIAGNOSIS — J449 Chronic obstructive pulmonary disease, unspecified: Secondary | ICD-10-CM | POA: Diagnosis not present

## 2015-11-02 DIAGNOSIS — E039 Hypothyroidism, unspecified: Secondary | ICD-10-CM | POA: Diagnosis not present

## 2015-11-02 DIAGNOSIS — S225XXB Flail chest, initial encounter for open fracture: Secondary | ICD-10-CM | POA: Diagnosis not present

## 2015-11-02 DIAGNOSIS — I10 Essential (primary) hypertension: Secondary | ICD-10-CM | POA: Diagnosis not present

## 2015-11-02 DIAGNOSIS — F339 Major depressive disorder, recurrent, unspecified: Secondary | ICD-10-CM | POA: Diagnosis not present

## 2015-11-02 DIAGNOSIS — I4891 Unspecified atrial fibrillation: Secondary | ICD-10-CM | POA: Diagnosis not present

## 2015-11-02 DIAGNOSIS — J189 Pneumonia, unspecified organism: Secondary | ICD-10-CM | POA: Diagnosis not present

## 2015-11-02 DIAGNOSIS — I679 Cerebrovascular disease, unspecified: Secondary | ICD-10-CM | POA: Diagnosis not present

## 2015-11-04 DIAGNOSIS — J449 Chronic obstructive pulmonary disease, unspecified: Secondary | ICD-10-CM | POA: Diagnosis not present

## 2015-11-04 DIAGNOSIS — S225XXB Flail chest, initial encounter for open fracture: Secondary | ICD-10-CM | POA: Diagnosis not present

## 2015-11-04 DIAGNOSIS — S299XXA Unspecified injury of thorax, initial encounter: Secondary | ICD-10-CM | POA: Diagnosis not present

## 2015-11-04 DIAGNOSIS — I5030 Unspecified diastolic (congestive) heart failure: Secondary | ICD-10-CM | POA: Diagnosis not present

## 2015-11-04 DIAGNOSIS — I679 Cerebrovascular disease, unspecified: Secondary | ICD-10-CM | POA: Diagnosis not present

## 2015-11-04 DIAGNOSIS — J189 Pneumonia, unspecified organism: Secondary | ICD-10-CM | POA: Diagnosis not present

## 2015-11-07 DIAGNOSIS — I679 Cerebrovascular disease, unspecified: Secondary | ICD-10-CM | POA: Diagnosis not present

## 2015-11-07 DIAGNOSIS — S225XXB Flail chest, initial encounter for open fracture: Secondary | ICD-10-CM | POA: Diagnosis not present

## 2015-11-07 DIAGNOSIS — I5030 Unspecified diastolic (congestive) heart failure: Secondary | ICD-10-CM | POA: Diagnosis not present

## 2015-11-07 DIAGNOSIS — S299XXA Unspecified injury of thorax, initial encounter: Secondary | ICD-10-CM | POA: Diagnosis not present

## 2015-11-07 DIAGNOSIS — J189 Pneumonia, unspecified organism: Secondary | ICD-10-CM | POA: Diagnosis not present

## 2015-11-07 DIAGNOSIS — J449 Chronic obstructive pulmonary disease, unspecified: Secondary | ICD-10-CM | POA: Diagnosis not present

## 2015-11-08 DIAGNOSIS — S299XXA Unspecified injury of thorax, initial encounter: Secondary | ICD-10-CM | POA: Diagnosis not present

## 2015-11-08 DIAGNOSIS — I5030 Unspecified diastolic (congestive) heart failure: Secondary | ICD-10-CM | POA: Diagnosis not present

## 2015-11-08 DIAGNOSIS — I679 Cerebrovascular disease, unspecified: Secondary | ICD-10-CM | POA: Diagnosis not present

## 2015-11-08 DIAGNOSIS — J189 Pneumonia, unspecified organism: Secondary | ICD-10-CM | POA: Diagnosis not present

## 2015-11-08 DIAGNOSIS — S225XXB Flail chest, initial encounter for open fracture: Secondary | ICD-10-CM | POA: Diagnosis not present

## 2015-11-08 DIAGNOSIS — J449 Chronic obstructive pulmonary disease, unspecified: Secondary | ICD-10-CM | POA: Diagnosis not present

## 2015-11-09 DIAGNOSIS — J189 Pneumonia, unspecified organism: Secondary | ICD-10-CM | POA: Diagnosis not present

## 2015-11-09 DIAGNOSIS — S225XXB Flail chest, initial encounter for open fracture: Secondary | ICD-10-CM | POA: Diagnosis not present

## 2015-11-09 DIAGNOSIS — S299XXA Unspecified injury of thorax, initial encounter: Secondary | ICD-10-CM | POA: Diagnosis not present

## 2015-11-09 DIAGNOSIS — I679 Cerebrovascular disease, unspecified: Secondary | ICD-10-CM | POA: Diagnosis not present

## 2015-11-09 DIAGNOSIS — J449 Chronic obstructive pulmonary disease, unspecified: Secondary | ICD-10-CM | POA: Diagnosis not present

## 2015-11-09 DIAGNOSIS — I5030 Unspecified diastolic (congestive) heart failure: Secondary | ICD-10-CM | POA: Diagnosis not present

## 2015-11-15 DIAGNOSIS — I679 Cerebrovascular disease, unspecified: Secondary | ICD-10-CM | POA: Diagnosis not present

## 2015-11-15 DIAGNOSIS — J189 Pneumonia, unspecified organism: Secondary | ICD-10-CM | POA: Diagnosis not present

## 2015-11-15 DIAGNOSIS — S225XXB Flail chest, initial encounter for open fracture: Secondary | ICD-10-CM | POA: Diagnosis not present

## 2015-11-15 DIAGNOSIS — S299XXA Unspecified injury of thorax, initial encounter: Secondary | ICD-10-CM | POA: Diagnosis not present

## 2015-11-15 DIAGNOSIS — J449 Chronic obstructive pulmonary disease, unspecified: Secondary | ICD-10-CM | POA: Diagnosis not present

## 2015-11-15 DIAGNOSIS — I5030 Unspecified diastolic (congestive) heart failure: Secondary | ICD-10-CM | POA: Diagnosis not present

## 2015-11-18 DIAGNOSIS — I5030 Unspecified diastolic (congestive) heart failure: Secondary | ICD-10-CM | POA: Diagnosis not present

## 2015-11-18 DIAGNOSIS — I679 Cerebrovascular disease, unspecified: Secondary | ICD-10-CM | POA: Diagnosis not present

## 2015-11-18 DIAGNOSIS — J449 Chronic obstructive pulmonary disease, unspecified: Secondary | ICD-10-CM | POA: Diagnosis not present

## 2015-11-18 DIAGNOSIS — S225XXB Flail chest, initial encounter for open fracture: Secondary | ICD-10-CM | POA: Diagnosis not present

## 2015-11-18 DIAGNOSIS — S299XXA Unspecified injury of thorax, initial encounter: Secondary | ICD-10-CM | POA: Diagnosis not present

## 2015-11-18 DIAGNOSIS — J189 Pneumonia, unspecified organism: Secondary | ICD-10-CM | POA: Diagnosis not present

## 2015-11-21 DIAGNOSIS — J449 Chronic obstructive pulmonary disease, unspecified: Secondary | ICD-10-CM | POA: Diagnosis not present

## 2015-11-21 DIAGNOSIS — S299XXA Unspecified injury of thorax, initial encounter: Secondary | ICD-10-CM | POA: Diagnosis not present

## 2015-11-21 DIAGNOSIS — S225XXB Flail chest, initial encounter for open fracture: Secondary | ICD-10-CM | POA: Diagnosis not present

## 2015-11-21 DIAGNOSIS — I679 Cerebrovascular disease, unspecified: Secondary | ICD-10-CM | POA: Diagnosis not present

## 2015-11-21 DIAGNOSIS — J189 Pneumonia, unspecified organism: Secondary | ICD-10-CM | POA: Diagnosis not present

## 2015-11-21 DIAGNOSIS — I5030 Unspecified diastolic (congestive) heart failure: Secondary | ICD-10-CM | POA: Diagnosis not present

## 2015-11-22 DIAGNOSIS — J449 Chronic obstructive pulmonary disease, unspecified: Secondary | ICD-10-CM | POA: Diagnosis not present

## 2015-11-22 DIAGNOSIS — J189 Pneumonia, unspecified organism: Secondary | ICD-10-CM | POA: Diagnosis not present

## 2015-11-22 DIAGNOSIS — I679 Cerebrovascular disease, unspecified: Secondary | ICD-10-CM | POA: Diagnosis not present

## 2015-11-22 DIAGNOSIS — I5030 Unspecified diastolic (congestive) heart failure: Secondary | ICD-10-CM | POA: Diagnosis not present

## 2015-11-22 DIAGNOSIS — S299XXA Unspecified injury of thorax, initial encounter: Secondary | ICD-10-CM | POA: Diagnosis not present

## 2015-11-22 DIAGNOSIS — S225XXB Flail chest, initial encounter for open fracture: Secondary | ICD-10-CM | POA: Diagnosis not present

## 2015-11-23 DIAGNOSIS — I679 Cerebrovascular disease, unspecified: Secondary | ICD-10-CM | POA: Diagnosis not present

## 2015-11-23 DIAGNOSIS — I5030 Unspecified diastolic (congestive) heart failure: Secondary | ICD-10-CM | POA: Diagnosis not present

## 2015-11-23 DIAGNOSIS — J449 Chronic obstructive pulmonary disease, unspecified: Secondary | ICD-10-CM | POA: Diagnosis not present

## 2015-11-23 DIAGNOSIS — J189 Pneumonia, unspecified organism: Secondary | ICD-10-CM | POA: Diagnosis not present

## 2015-11-23 DIAGNOSIS — S225XXB Flail chest, initial encounter for open fracture: Secondary | ICD-10-CM | POA: Diagnosis not present

## 2015-11-23 DIAGNOSIS — S299XXA Unspecified injury of thorax, initial encounter: Secondary | ICD-10-CM | POA: Diagnosis not present

## 2015-11-25 DIAGNOSIS — J449 Chronic obstructive pulmonary disease, unspecified: Secondary | ICD-10-CM | POA: Diagnosis not present

## 2015-11-25 DIAGNOSIS — I679 Cerebrovascular disease, unspecified: Secondary | ICD-10-CM | POA: Diagnosis not present

## 2015-11-25 DIAGNOSIS — I5030 Unspecified diastolic (congestive) heart failure: Secondary | ICD-10-CM | POA: Diagnosis not present

## 2015-11-25 DIAGNOSIS — J189 Pneumonia, unspecified organism: Secondary | ICD-10-CM | POA: Diagnosis not present

## 2015-11-25 DIAGNOSIS — S299XXA Unspecified injury of thorax, initial encounter: Secondary | ICD-10-CM | POA: Diagnosis not present

## 2015-11-25 DIAGNOSIS — S225XXB Flail chest, initial encounter for open fracture: Secondary | ICD-10-CM | POA: Diagnosis not present

## 2015-11-28 DIAGNOSIS — S225XXB Flail chest, initial encounter for open fracture: Secondary | ICD-10-CM | POA: Diagnosis not present

## 2015-11-28 DIAGNOSIS — I679 Cerebrovascular disease, unspecified: Secondary | ICD-10-CM | POA: Diagnosis not present

## 2015-11-28 DIAGNOSIS — S299XXA Unspecified injury of thorax, initial encounter: Secondary | ICD-10-CM | POA: Diagnosis not present

## 2015-11-28 DIAGNOSIS — I5030 Unspecified diastolic (congestive) heart failure: Secondary | ICD-10-CM | POA: Diagnosis not present

## 2015-11-28 DIAGNOSIS — J189 Pneumonia, unspecified organism: Secondary | ICD-10-CM | POA: Diagnosis not present

## 2015-11-28 DIAGNOSIS — J449 Chronic obstructive pulmonary disease, unspecified: Secondary | ICD-10-CM | POA: Diagnosis not present

## 2015-11-29 DIAGNOSIS — J449 Chronic obstructive pulmonary disease, unspecified: Secondary | ICD-10-CM | POA: Diagnosis not present

## 2015-11-29 DIAGNOSIS — S225XXB Flail chest, initial encounter for open fracture: Secondary | ICD-10-CM | POA: Diagnosis not present

## 2015-11-29 DIAGNOSIS — J189 Pneumonia, unspecified organism: Secondary | ICD-10-CM | POA: Diagnosis not present

## 2015-11-29 DIAGNOSIS — I5030 Unspecified diastolic (congestive) heart failure: Secondary | ICD-10-CM | POA: Diagnosis not present

## 2015-11-29 DIAGNOSIS — I679 Cerebrovascular disease, unspecified: Secondary | ICD-10-CM | POA: Diagnosis not present

## 2015-11-29 DIAGNOSIS — S299XXA Unspecified injury of thorax, initial encounter: Secondary | ICD-10-CM | POA: Diagnosis not present

## 2015-12-03 DIAGNOSIS — F339 Major depressive disorder, recurrent, unspecified: Secondary | ICD-10-CM | POA: Diagnosis not present

## 2015-12-03 DIAGNOSIS — I10 Essential (primary) hypertension: Secondary | ICD-10-CM | POA: Diagnosis not present

## 2015-12-03 DIAGNOSIS — J9620 Acute and chronic respiratory failure, unspecified whether with hypoxia or hypercapnia: Secondary | ICD-10-CM | POA: Diagnosis not present

## 2015-12-03 DIAGNOSIS — I5030 Unspecified diastolic (congestive) heart failure: Secondary | ICD-10-CM | POA: Diagnosis not present

## 2015-12-03 DIAGNOSIS — I4891 Unspecified atrial fibrillation: Secondary | ICD-10-CM | POA: Diagnosis not present

## 2015-12-03 DIAGNOSIS — J449 Chronic obstructive pulmonary disease, unspecified: Secondary | ICD-10-CM | POA: Diagnosis not present

## 2015-12-03 DIAGNOSIS — D500 Iron deficiency anemia secondary to blood loss (chronic): Secondary | ICD-10-CM | POA: Diagnosis not present

## 2015-12-03 DIAGNOSIS — S225XXB Flail chest, initial encounter for open fracture: Secondary | ICD-10-CM | POA: Diagnosis not present

## 2015-12-03 DIAGNOSIS — E46 Unspecified protein-calorie malnutrition: Secondary | ICD-10-CM | POA: Diagnosis not present

## 2015-12-03 DIAGNOSIS — J189 Pneumonia, unspecified organism: Secondary | ICD-10-CM | POA: Diagnosis not present

## 2015-12-03 DIAGNOSIS — S299XXA Unspecified injury of thorax, initial encounter: Secondary | ICD-10-CM | POA: Diagnosis not present

## 2015-12-03 DIAGNOSIS — I679 Cerebrovascular disease, unspecified: Secondary | ICD-10-CM | POA: Diagnosis not present

## 2015-12-03 DIAGNOSIS — K219 Gastro-esophageal reflux disease without esophagitis: Secondary | ICD-10-CM | POA: Diagnosis not present

## 2015-12-03 DIAGNOSIS — E039 Hypothyroidism, unspecified: Secondary | ICD-10-CM | POA: Diagnosis not present

## 2015-12-06 DIAGNOSIS — J189 Pneumonia, unspecified organism: Secondary | ICD-10-CM | POA: Diagnosis not present

## 2015-12-06 DIAGNOSIS — S225XXB Flail chest, initial encounter for open fracture: Secondary | ICD-10-CM | POA: Diagnosis not present

## 2015-12-06 DIAGNOSIS — I679 Cerebrovascular disease, unspecified: Secondary | ICD-10-CM | POA: Diagnosis not present

## 2015-12-06 DIAGNOSIS — I5030 Unspecified diastolic (congestive) heart failure: Secondary | ICD-10-CM | POA: Diagnosis not present

## 2015-12-06 DIAGNOSIS — J449 Chronic obstructive pulmonary disease, unspecified: Secondary | ICD-10-CM | POA: Diagnosis not present

## 2015-12-06 DIAGNOSIS — S299XXA Unspecified injury of thorax, initial encounter: Secondary | ICD-10-CM | POA: Diagnosis not present

## 2015-12-13 DIAGNOSIS — S299XXA Unspecified injury of thorax, initial encounter: Secondary | ICD-10-CM | POA: Diagnosis not present

## 2015-12-13 DIAGNOSIS — S225XXB Flail chest, initial encounter for open fracture: Secondary | ICD-10-CM | POA: Diagnosis not present

## 2015-12-13 DIAGNOSIS — I679 Cerebrovascular disease, unspecified: Secondary | ICD-10-CM | POA: Diagnosis not present

## 2015-12-13 DIAGNOSIS — J189 Pneumonia, unspecified organism: Secondary | ICD-10-CM | POA: Diagnosis not present

## 2015-12-13 DIAGNOSIS — J449 Chronic obstructive pulmonary disease, unspecified: Secondary | ICD-10-CM | POA: Diagnosis not present

## 2015-12-13 DIAGNOSIS — I5030 Unspecified diastolic (congestive) heart failure: Secondary | ICD-10-CM | POA: Diagnosis not present

## 2015-12-20 DIAGNOSIS — S299XXA Unspecified injury of thorax, initial encounter: Secondary | ICD-10-CM | POA: Diagnosis not present

## 2015-12-20 DIAGNOSIS — J189 Pneumonia, unspecified organism: Secondary | ICD-10-CM | POA: Diagnosis not present

## 2015-12-20 DIAGNOSIS — I679 Cerebrovascular disease, unspecified: Secondary | ICD-10-CM | POA: Diagnosis not present

## 2015-12-20 DIAGNOSIS — J449 Chronic obstructive pulmonary disease, unspecified: Secondary | ICD-10-CM | POA: Diagnosis not present

## 2015-12-20 DIAGNOSIS — I5030 Unspecified diastolic (congestive) heart failure: Secondary | ICD-10-CM | POA: Diagnosis not present

## 2015-12-20 DIAGNOSIS — S225XXB Flail chest, initial encounter for open fracture: Secondary | ICD-10-CM | POA: Diagnosis not present

## 2015-12-26 DIAGNOSIS — I679 Cerebrovascular disease, unspecified: Secondary | ICD-10-CM | POA: Diagnosis not present

## 2015-12-26 DIAGNOSIS — S299XXA Unspecified injury of thorax, initial encounter: Secondary | ICD-10-CM | POA: Diagnosis not present

## 2015-12-26 DIAGNOSIS — J449 Chronic obstructive pulmonary disease, unspecified: Secondary | ICD-10-CM | POA: Diagnosis not present

## 2015-12-26 DIAGNOSIS — J189 Pneumonia, unspecified organism: Secondary | ICD-10-CM | POA: Diagnosis not present

## 2015-12-26 DIAGNOSIS — S225XXB Flail chest, initial encounter for open fracture: Secondary | ICD-10-CM | POA: Diagnosis not present

## 2015-12-26 DIAGNOSIS — I5030 Unspecified diastolic (congestive) heart failure: Secondary | ICD-10-CM | POA: Diagnosis not present

## 2015-12-27 DIAGNOSIS — S225XXB Flail chest, initial encounter for open fracture: Secondary | ICD-10-CM | POA: Diagnosis not present

## 2015-12-27 DIAGNOSIS — J449 Chronic obstructive pulmonary disease, unspecified: Secondary | ICD-10-CM | POA: Diagnosis not present

## 2015-12-27 DIAGNOSIS — J189 Pneumonia, unspecified organism: Secondary | ICD-10-CM | POA: Diagnosis not present

## 2015-12-27 DIAGNOSIS — I5030 Unspecified diastolic (congestive) heart failure: Secondary | ICD-10-CM | POA: Diagnosis not present

## 2015-12-27 DIAGNOSIS — I679 Cerebrovascular disease, unspecified: Secondary | ICD-10-CM | POA: Diagnosis not present

## 2015-12-27 DIAGNOSIS — S299XXA Unspecified injury of thorax, initial encounter: Secondary | ICD-10-CM | POA: Diagnosis not present

## 2015-12-28 DIAGNOSIS — J449 Chronic obstructive pulmonary disease, unspecified: Secondary | ICD-10-CM | POA: Diagnosis not present

## 2015-12-28 DIAGNOSIS — I679 Cerebrovascular disease, unspecified: Secondary | ICD-10-CM | POA: Diagnosis not present

## 2015-12-28 DIAGNOSIS — J189 Pneumonia, unspecified organism: Secondary | ICD-10-CM | POA: Diagnosis not present

## 2015-12-28 DIAGNOSIS — S299XXA Unspecified injury of thorax, initial encounter: Secondary | ICD-10-CM | POA: Diagnosis not present

## 2015-12-28 DIAGNOSIS — S225XXB Flail chest, initial encounter for open fracture: Secondary | ICD-10-CM | POA: Diagnosis not present

## 2015-12-28 DIAGNOSIS — I5030 Unspecified diastolic (congestive) heart failure: Secondary | ICD-10-CM | POA: Diagnosis not present

## 2015-12-30 DIAGNOSIS — I679 Cerebrovascular disease, unspecified: Secondary | ICD-10-CM | POA: Diagnosis not present

## 2015-12-30 DIAGNOSIS — I5030 Unspecified diastolic (congestive) heart failure: Secondary | ICD-10-CM | POA: Diagnosis not present

## 2015-12-30 DIAGNOSIS — S225XXB Flail chest, initial encounter for open fracture: Secondary | ICD-10-CM | POA: Diagnosis not present

## 2015-12-30 DIAGNOSIS — J189 Pneumonia, unspecified organism: Secondary | ICD-10-CM | POA: Diagnosis not present

## 2015-12-30 DIAGNOSIS — J449 Chronic obstructive pulmonary disease, unspecified: Secondary | ICD-10-CM | POA: Diagnosis not present

## 2015-12-30 DIAGNOSIS — S299XXA Unspecified injury of thorax, initial encounter: Secondary | ICD-10-CM | POA: Diagnosis not present

## 2016-01-02 DIAGNOSIS — J9620 Acute and chronic respiratory failure, unspecified whether with hypoxia or hypercapnia: Secondary | ICD-10-CM | POA: Diagnosis not present

## 2016-01-02 DIAGNOSIS — S225XXB Flail chest, initial encounter for open fracture: Secondary | ICD-10-CM | POA: Diagnosis not present

## 2016-01-02 DIAGNOSIS — E039 Hypothyroidism, unspecified: Secondary | ICD-10-CM | POA: Diagnosis not present

## 2016-01-02 DIAGNOSIS — S299XXA Unspecified injury of thorax, initial encounter: Secondary | ICD-10-CM | POA: Diagnosis not present

## 2016-01-02 DIAGNOSIS — J189 Pneumonia, unspecified organism: Secondary | ICD-10-CM | POA: Diagnosis not present

## 2016-01-02 DIAGNOSIS — I10 Essential (primary) hypertension: Secondary | ICD-10-CM | POA: Diagnosis not present

## 2016-01-02 DIAGNOSIS — J449 Chronic obstructive pulmonary disease, unspecified: Secondary | ICD-10-CM | POA: Diagnosis not present

## 2016-01-02 DIAGNOSIS — F339 Major depressive disorder, recurrent, unspecified: Secondary | ICD-10-CM | POA: Diagnosis not present

## 2016-01-02 DIAGNOSIS — D500 Iron deficiency anemia secondary to blood loss (chronic): Secondary | ICD-10-CM | POA: Diagnosis not present

## 2016-01-02 DIAGNOSIS — I4891 Unspecified atrial fibrillation: Secondary | ICD-10-CM | POA: Diagnosis not present

## 2016-01-02 DIAGNOSIS — I5030 Unspecified diastolic (congestive) heart failure: Secondary | ICD-10-CM | POA: Diagnosis not present

## 2016-01-02 DIAGNOSIS — E46 Unspecified protein-calorie malnutrition: Secondary | ICD-10-CM | POA: Diagnosis not present

## 2016-01-02 DIAGNOSIS — I679 Cerebrovascular disease, unspecified: Secondary | ICD-10-CM | POA: Diagnosis not present

## 2016-01-02 DIAGNOSIS — K219 Gastro-esophageal reflux disease without esophagitis: Secondary | ICD-10-CM | POA: Diagnosis not present

## 2016-01-03 DIAGNOSIS — I5030 Unspecified diastolic (congestive) heart failure: Secondary | ICD-10-CM | POA: Diagnosis not present

## 2016-01-03 DIAGNOSIS — S299XXA Unspecified injury of thorax, initial encounter: Secondary | ICD-10-CM | POA: Diagnosis not present

## 2016-01-03 DIAGNOSIS — I679 Cerebrovascular disease, unspecified: Secondary | ICD-10-CM | POA: Diagnosis not present

## 2016-01-03 DIAGNOSIS — J449 Chronic obstructive pulmonary disease, unspecified: Secondary | ICD-10-CM | POA: Diagnosis not present

## 2016-01-03 DIAGNOSIS — J189 Pneumonia, unspecified organism: Secondary | ICD-10-CM | POA: Diagnosis not present

## 2016-01-03 DIAGNOSIS — S225XXB Flail chest, initial encounter for open fracture: Secondary | ICD-10-CM | POA: Diagnosis not present

## 2016-01-04 DIAGNOSIS — J449 Chronic obstructive pulmonary disease, unspecified: Secondary | ICD-10-CM | POA: Diagnosis not present

## 2016-01-04 DIAGNOSIS — S299XXA Unspecified injury of thorax, initial encounter: Secondary | ICD-10-CM | POA: Diagnosis not present

## 2016-01-04 DIAGNOSIS — I5030 Unspecified diastolic (congestive) heart failure: Secondary | ICD-10-CM | POA: Diagnosis not present

## 2016-01-04 DIAGNOSIS — I679 Cerebrovascular disease, unspecified: Secondary | ICD-10-CM | POA: Diagnosis not present

## 2016-01-04 DIAGNOSIS — S225XXB Flail chest, initial encounter for open fracture: Secondary | ICD-10-CM | POA: Diagnosis not present

## 2016-01-04 DIAGNOSIS — J189 Pneumonia, unspecified organism: Secondary | ICD-10-CM | POA: Diagnosis not present

## 2016-01-10 DIAGNOSIS — I679 Cerebrovascular disease, unspecified: Secondary | ICD-10-CM | POA: Diagnosis not present

## 2016-01-10 DIAGNOSIS — J449 Chronic obstructive pulmonary disease, unspecified: Secondary | ICD-10-CM | POA: Diagnosis not present

## 2016-01-10 DIAGNOSIS — I5030 Unspecified diastolic (congestive) heart failure: Secondary | ICD-10-CM | POA: Diagnosis not present

## 2016-01-10 DIAGNOSIS — J189 Pneumonia, unspecified organism: Secondary | ICD-10-CM | POA: Diagnosis not present

## 2016-01-10 DIAGNOSIS — S299XXA Unspecified injury of thorax, initial encounter: Secondary | ICD-10-CM | POA: Diagnosis not present

## 2016-01-10 DIAGNOSIS — S225XXB Flail chest, initial encounter for open fracture: Secondary | ICD-10-CM | POA: Diagnosis not present

## 2016-01-11 DIAGNOSIS — S299XXA Unspecified injury of thorax, initial encounter: Secondary | ICD-10-CM | POA: Diagnosis not present

## 2016-01-11 DIAGNOSIS — I679 Cerebrovascular disease, unspecified: Secondary | ICD-10-CM | POA: Diagnosis not present

## 2016-01-11 DIAGNOSIS — I5030 Unspecified diastolic (congestive) heart failure: Secondary | ICD-10-CM | POA: Diagnosis not present

## 2016-01-11 DIAGNOSIS — S225XXB Flail chest, initial encounter for open fracture: Secondary | ICD-10-CM | POA: Diagnosis not present

## 2016-01-11 DIAGNOSIS — J189 Pneumonia, unspecified organism: Secondary | ICD-10-CM | POA: Diagnosis not present

## 2016-01-11 DIAGNOSIS — J449 Chronic obstructive pulmonary disease, unspecified: Secondary | ICD-10-CM | POA: Diagnosis not present

## 2016-01-14 DIAGNOSIS — S299XXA Unspecified injury of thorax, initial encounter: Secondary | ICD-10-CM | POA: Diagnosis not present

## 2016-01-14 DIAGNOSIS — J189 Pneumonia, unspecified organism: Secondary | ICD-10-CM | POA: Diagnosis not present

## 2016-01-14 DIAGNOSIS — I5030 Unspecified diastolic (congestive) heart failure: Secondary | ICD-10-CM | POA: Diagnosis not present

## 2016-01-14 DIAGNOSIS — S225XXB Flail chest, initial encounter for open fracture: Secondary | ICD-10-CM | POA: Diagnosis not present

## 2016-01-14 DIAGNOSIS — I679 Cerebrovascular disease, unspecified: Secondary | ICD-10-CM | POA: Diagnosis not present

## 2016-01-14 DIAGNOSIS — J449 Chronic obstructive pulmonary disease, unspecified: Secondary | ICD-10-CM | POA: Diagnosis not present

## 2016-01-18 DIAGNOSIS — S225XXB Flail chest, initial encounter for open fracture: Secondary | ICD-10-CM | POA: Diagnosis not present

## 2016-01-18 DIAGNOSIS — I5030 Unspecified diastolic (congestive) heart failure: Secondary | ICD-10-CM | POA: Diagnosis not present

## 2016-01-18 DIAGNOSIS — J189 Pneumonia, unspecified organism: Secondary | ICD-10-CM | POA: Diagnosis not present

## 2016-01-18 DIAGNOSIS — J449 Chronic obstructive pulmonary disease, unspecified: Secondary | ICD-10-CM | POA: Diagnosis not present

## 2016-01-18 DIAGNOSIS — I679 Cerebrovascular disease, unspecified: Secondary | ICD-10-CM | POA: Diagnosis not present

## 2016-01-18 DIAGNOSIS — S299XXA Unspecified injury of thorax, initial encounter: Secondary | ICD-10-CM | POA: Diagnosis not present

## 2016-01-20 DIAGNOSIS — S299XXA Unspecified injury of thorax, initial encounter: Secondary | ICD-10-CM | POA: Diagnosis not present

## 2016-01-20 DIAGNOSIS — J449 Chronic obstructive pulmonary disease, unspecified: Secondary | ICD-10-CM | POA: Diagnosis not present

## 2016-01-20 DIAGNOSIS — J189 Pneumonia, unspecified organism: Secondary | ICD-10-CM | POA: Diagnosis not present

## 2016-01-20 DIAGNOSIS — I5030 Unspecified diastolic (congestive) heart failure: Secondary | ICD-10-CM | POA: Diagnosis not present

## 2016-01-20 DIAGNOSIS — I679 Cerebrovascular disease, unspecified: Secondary | ICD-10-CM | POA: Diagnosis not present

## 2016-01-20 DIAGNOSIS — S225XXB Flail chest, initial encounter for open fracture: Secondary | ICD-10-CM | POA: Diagnosis not present

## 2016-01-24 DIAGNOSIS — I5030 Unspecified diastolic (congestive) heart failure: Secondary | ICD-10-CM | POA: Diagnosis not present

## 2016-01-24 DIAGNOSIS — S299XXA Unspecified injury of thorax, initial encounter: Secondary | ICD-10-CM | POA: Diagnosis not present

## 2016-01-24 DIAGNOSIS — J189 Pneumonia, unspecified organism: Secondary | ICD-10-CM | POA: Diagnosis not present

## 2016-01-24 DIAGNOSIS — J449 Chronic obstructive pulmonary disease, unspecified: Secondary | ICD-10-CM | POA: Diagnosis not present

## 2016-01-24 DIAGNOSIS — I679 Cerebrovascular disease, unspecified: Secondary | ICD-10-CM | POA: Diagnosis not present

## 2016-01-24 DIAGNOSIS — S225XXB Flail chest, initial encounter for open fracture: Secondary | ICD-10-CM | POA: Diagnosis not present

## 2016-01-25 DIAGNOSIS — S225XXB Flail chest, initial encounter for open fracture: Secondary | ICD-10-CM | POA: Diagnosis not present

## 2016-01-25 DIAGNOSIS — J189 Pneumonia, unspecified organism: Secondary | ICD-10-CM | POA: Diagnosis not present

## 2016-01-25 DIAGNOSIS — S299XXA Unspecified injury of thorax, initial encounter: Secondary | ICD-10-CM | POA: Diagnosis not present

## 2016-01-25 DIAGNOSIS — I5030 Unspecified diastolic (congestive) heart failure: Secondary | ICD-10-CM | POA: Diagnosis not present

## 2016-01-25 DIAGNOSIS — I679 Cerebrovascular disease, unspecified: Secondary | ICD-10-CM | POA: Diagnosis not present

## 2016-01-25 DIAGNOSIS — J449 Chronic obstructive pulmonary disease, unspecified: Secondary | ICD-10-CM | POA: Diagnosis not present

## 2016-01-26 DIAGNOSIS — S299XXA Unspecified injury of thorax, initial encounter: Secondary | ICD-10-CM | POA: Diagnosis not present

## 2016-01-26 DIAGNOSIS — J189 Pneumonia, unspecified organism: Secondary | ICD-10-CM | POA: Diagnosis not present

## 2016-01-26 DIAGNOSIS — I5030 Unspecified diastolic (congestive) heart failure: Secondary | ICD-10-CM | POA: Diagnosis not present

## 2016-01-26 DIAGNOSIS — S225XXB Flail chest, initial encounter for open fracture: Secondary | ICD-10-CM | POA: Diagnosis not present

## 2016-01-26 DIAGNOSIS — I679 Cerebrovascular disease, unspecified: Secondary | ICD-10-CM | POA: Diagnosis not present

## 2016-01-26 DIAGNOSIS — J449 Chronic obstructive pulmonary disease, unspecified: Secondary | ICD-10-CM | POA: Diagnosis not present

## 2016-01-27 DIAGNOSIS — I679 Cerebrovascular disease, unspecified: Secondary | ICD-10-CM | POA: Diagnosis not present

## 2016-01-27 DIAGNOSIS — S299XXA Unspecified injury of thorax, initial encounter: Secondary | ICD-10-CM | POA: Diagnosis not present

## 2016-01-27 DIAGNOSIS — I5030 Unspecified diastolic (congestive) heart failure: Secondary | ICD-10-CM | POA: Diagnosis not present

## 2016-01-27 DIAGNOSIS — S225XXB Flail chest, initial encounter for open fracture: Secondary | ICD-10-CM | POA: Diagnosis not present

## 2016-01-27 DIAGNOSIS — J449 Chronic obstructive pulmonary disease, unspecified: Secondary | ICD-10-CM | POA: Diagnosis not present

## 2016-01-27 DIAGNOSIS — J189 Pneumonia, unspecified organism: Secondary | ICD-10-CM | POA: Diagnosis not present

## 2016-01-31 DIAGNOSIS — S225XXB Flail chest, initial encounter for open fracture: Secondary | ICD-10-CM | POA: Diagnosis not present

## 2016-01-31 DIAGNOSIS — S299XXA Unspecified injury of thorax, initial encounter: Secondary | ICD-10-CM | POA: Diagnosis not present

## 2016-01-31 DIAGNOSIS — J449 Chronic obstructive pulmonary disease, unspecified: Secondary | ICD-10-CM | POA: Diagnosis not present

## 2016-01-31 DIAGNOSIS — I679 Cerebrovascular disease, unspecified: Secondary | ICD-10-CM | POA: Diagnosis not present

## 2016-01-31 DIAGNOSIS — J189 Pneumonia, unspecified organism: Secondary | ICD-10-CM | POA: Diagnosis not present

## 2016-01-31 DIAGNOSIS — I5030 Unspecified diastolic (congestive) heart failure: Secondary | ICD-10-CM | POA: Diagnosis not present

## 2016-02-01 DIAGNOSIS — S299XXA Unspecified injury of thorax, initial encounter: Secondary | ICD-10-CM | POA: Diagnosis not present

## 2016-02-01 DIAGNOSIS — I5030 Unspecified diastolic (congestive) heart failure: Secondary | ICD-10-CM | POA: Diagnosis not present

## 2016-02-01 DIAGNOSIS — J449 Chronic obstructive pulmonary disease, unspecified: Secondary | ICD-10-CM | POA: Diagnosis not present

## 2016-02-01 DIAGNOSIS — I679 Cerebrovascular disease, unspecified: Secondary | ICD-10-CM | POA: Diagnosis not present

## 2016-02-01 DIAGNOSIS — S225XXB Flail chest, initial encounter for open fracture: Secondary | ICD-10-CM | POA: Diagnosis not present

## 2016-02-01 DIAGNOSIS — J189 Pneumonia, unspecified organism: Secondary | ICD-10-CM | POA: Diagnosis not present

## 2016-02-02 DIAGNOSIS — I4891 Unspecified atrial fibrillation: Secondary | ICD-10-CM | POA: Diagnosis not present

## 2016-02-02 DIAGNOSIS — I5030 Unspecified diastolic (congestive) heart failure: Secondary | ICD-10-CM | POA: Diagnosis not present

## 2016-02-02 DIAGNOSIS — J189 Pneumonia, unspecified organism: Secondary | ICD-10-CM | POA: Diagnosis not present

## 2016-02-02 DIAGNOSIS — S299XXA Unspecified injury of thorax, initial encounter: Secondary | ICD-10-CM | POA: Diagnosis not present

## 2016-02-02 DIAGNOSIS — F339 Major depressive disorder, recurrent, unspecified: Secondary | ICD-10-CM | POA: Diagnosis not present

## 2016-02-02 DIAGNOSIS — I679 Cerebrovascular disease, unspecified: Secondary | ICD-10-CM | POA: Diagnosis not present

## 2016-02-02 DIAGNOSIS — D500 Iron deficiency anemia secondary to blood loss (chronic): Secondary | ICD-10-CM | POA: Diagnosis not present

## 2016-02-02 DIAGNOSIS — I10 Essential (primary) hypertension: Secondary | ICD-10-CM | POA: Diagnosis not present

## 2016-02-02 DIAGNOSIS — S225XXB Flail chest, initial encounter for open fracture: Secondary | ICD-10-CM | POA: Diagnosis not present

## 2016-02-02 DIAGNOSIS — J9620 Acute and chronic respiratory failure, unspecified whether with hypoxia or hypercapnia: Secondary | ICD-10-CM | POA: Diagnosis not present

## 2016-02-02 DIAGNOSIS — J449 Chronic obstructive pulmonary disease, unspecified: Secondary | ICD-10-CM | POA: Diagnosis not present

## 2016-02-02 DIAGNOSIS — E039 Hypothyroidism, unspecified: Secondary | ICD-10-CM | POA: Diagnosis not present

## 2016-02-02 DIAGNOSIS — K219 Gastro-esophageal reflux disease without esophagitis: Secondary | ICD-10-CM | POA: Diagnosis not present

## 2016-02-02 DIAGNOSIS — E46 Unspecified protein-calorie malnutrition: Secondary | ICD-10-CM | POA: Diagnosis not present

## 2016-02-07 DIAGNOSIS — S225XXB Flail chest, initial encounter for open fracture: Secondary | ICD-10-CM | POA: Diagnosis not present

## 2016-02-07 DIAGNOSIS — I679 Cerebrovascular disease, unspecified: Secondary | ICD-10-CM | POA: Diagnosis not present

## 2016-02-07 DIAGNOSIS — I5030 Unspecified diastolic (congestive) heart failure: Secondary | ICD-10-CM | POA: Diagnosis not present

## 2016-02-07 DIAGNOSIS — S299XXA Unspecified injury of thorax, initial encounter: Secondary | ICD-10-CM | POA: Diagnosis not present

## 2016-02-07 DIAGNOSIS — J449 Chronic obstructive pulmonary disease, unspecified: Secondary | ICD-10-CM | POA: Diagnosis not present

## 2016-02-07 DIAGNOSIS — J189 Pneumonia, unspecified organism: Secondary | ICD-10-CM | POA: Diagnosis not present

## 2016-02-08 DIAGNOSIS — J449 Chronic obstructive pulmonary disease, unspecified: Secondary | ICD-10-CM | POA: Diagnosis not present

## 2016-02-08 DIAGNOSIS — I5030 Unspecified diastolic (congestive) heart failure: Secondary | ICD-10-CM | POA: Diagnosis not present

## 2016-02-08 DIAGNOSIS — S299XXA Unspecified injury of thorax, initial encounter: Secondary | ICD-10-CM | POA: Diagnosis not present

## 2016-02-08 DIAGNOSIS — J189 Pneumonia, unspecified organism: Secondary | ICD-10-CM | POA: Diagnosis not present

## 2016-02-08 DIAGNOSIS — I679 Cerebrovascular disease, unspecified: Secondary | ICD-10-CM | POA: Diagnosis not present

## 2016-02-08 DIAGNOSIS — S225XXB Flail chest, initial encounter for open fracture: Secondary | ICD-10-CM | POA: Diagnosis not present

## 2016-02-14 DIAGNOSIS — J449 Chronic obstructive pulmonary disease, unspecified: Secondary | ICD-10-CM | POA: Diagnosis not present

## 2016-02-14 DIAGNOSIS — J189 Pneumonia, unspecified organism: Secondary | ICD-10-CM | POA: Diagnosis not present

## 2016-02-14 DIAGNOSIS — S225XXB Flail chest, initial encounter for open fracture: Secondary | ICD-10-CM | POA: Diagnosis not present

## 2016-02-14 DIAGNOSIS — S299XXA Unspecified injury of thorax, initial encounter: Secondary | ICD-10-CM | POA: Diagnosis not present

## 2016-02-14 DIAGNOSIS — I679 Cerebrovascular disease, unspecified: Secondary | ICD-10-CM | POA: Diagnosis not present

## 2016-02-14 DIAGNOSIS — I5030 Unspecified diastolic (congestive) heart failure: Secondary | ICD-10-CM | POA: Diagnosis not present

## 2016-02-15 DIAGNOSIS — I679 Cerebrovascular disease, unspecified: Secondary | ICD-10-CM | POA: Diagnosis not present

## 2016-02-15 DIAGNOSIS — J449 Chronic obstructive pulmonary disease, unspecified: Secondary | ICD-10-CM | POA: Diagnosis not present

## 2016-02-15 DIAGNOSIS — S225XXB Flail chest, initial encounter for open fracture: Secondary | ICD-10-CM | POA: Diagnosis not present

## 2016-02-15 DIAGNOSIS — J189 Pneumonia, unspecified organism: Secondary | ICD-10-CM | POA: Diagnosis not present

## 2016-02-15 DIAGNOSIS — S299XXA Unspecified injury of thorax, initial encounter: Secondary | ICD-10-CM | POA: Diagnosis not present

## 2016-02-15 DIAGNOSIS — I5030 Unspecified diastolic (congestive) heart failure: Secondary | ICD-10-CM | POA: Diagnosis not present

## 2016-02-16 DIAGNOSIS — S225XXB Flail chest, initial encounter for open fracture: Secondary | ICD-10-CM | POA: Diagnosis not present

## 2016-02-16 DIAGNOSIS — I679 Cerebrovascular disease, unspecified: Secondary | ICD-10-CM | POA: Diagnosis not present

## 2016-02-16 DIAGNOSIS — J449 Chronic obstructive pulmonary disease, unspecified: Secondary | ICD-10-CM | POA: Diagnosis not present

## 2016-02-16 DIAGNOSIS — I5030 Unspecified diastolic (congestive) heart failure: Secondary | ICD-10-CM | POA: Diagnosis not present

## 2016-02-16 DIAGNOSIS — S299XXA Unspecified injury of thorax, initial encounter: Secondary | ICD-10-CM | POA: Diagnosis not present

## 2016-02-16 DIAGNOSIS — J189 Pneumonia, unspecified organism: Secondary | ICD-10-CM | POA: Diagnosis not present

## 2016-02-21 DIAGNOSIS — S299XXA Unspecified injury of thorax, initial encounter: Secondary | ICD-10-CM | POA: Diagnosis not present

## 2016-02-21 DIAGNOSIS — I5030 Unspecified diastolic (congestive) heart failure: Secondary | ICD-10-CM | POA: Diagnosis not present

## 2016-02-21 DIAGNOSIS — I679 Cerebrovascular disease, unspecified: Secondary | ICD-10-CM | POA: Diagnosis not present

## 2016-02-21 DIAGNOSIS — J449 Chronic obstructive pulmonary disease, unspecified: Secondary | ICD-10-CM | POA: Diagnosis not present

## 2016-02-21 DIAGNOSIS — S225XXB Flail chest, initial encounter for open fracture: Secondary | ICD-10-CM | POA: Diagnosis not present

## 2016-02-21 DIAGNOSIS — J189 Pneumonia, unspecified organism: Secondary | ICD-10-CM | POA: Diagnosis not present

## 2016-02-22 DIAGNOSIS — I679 Cerebrovascular disease, unspecified: Secondary | ICD-10-CM | POA: Diagnosis not present

## 2016-02-22 DIAGNOSIS — J449 Chronic obstructive pulmonary disease, unspecified: Secondary | ICD-10-CM | POA: Diagnosis not present

## 2016-02-22 DIAGNOSIS — S225XXB Flail chest, initial encounter for open fracture: Secondary | ICD-10-CM | POA: Diagnosis not present

## 2016-02-22 DIAGNOSIS — S299XXA Unspecified injury of thorax, initial encounter: Secondary | ICD-10-CM | POA: Diagnosis not present

## 2016-02-22 DIAGNOSIS — J189 Pneumonia, unspecified organism: Secondary | ICD-10-CM | POA: Diagnosis not present

## 2016-02-22 DIAGNOSIS — I5030 Unspecified diastolic (congestive) heart failure: Secondary | ICD-10-CM | POA: Diagnosis not present

## 2016-02-24 DIAGNOSIS — J189 Pneumonia, unspecified organism: Secondary | ICD-10-CM | POA: Diagnosis not present

## 2016-02-24 DIAGNOSIS — S225XXB Flail chest, initial encounter for open fracture: Secondary | ICD-10-CM | POA: Diagnosis not present

## 2016-02-24 DIAGNOSIS — I679 Cerebrovascular disease, unspecified: Secondary | ICD-10-CM | POA: Diagnosis not present

## 2016-02-24 DIAGNOSIS — J449 Chronic obstructive pulmonary disease, unspecified: Secondary | ICD-10-CM | POA: Diagnosis not present

## 2016-02-24 DIAGNOSIS — I5030 Unspecified diastolic (congestive) heart failure: Secondary | ICD-10-CM | POA: Diagnosis not present

## 2016-02-24 DIAGNOSIS — S299XXA Unspecified injury of thorax, initial encounter: Secondary | ICD-10-CM | POA: Diagnosis not present

## 2016-02-28 DIAGNOSIS — I5030 Unspecified diastolic (congestive) heart failure: Secondary | ICD-10-CM | POA: Diagnosis not present

## 2016-02-28 DIAGNOSIS — S225XXB Flail chest, initial encounter for open fracture: Secondary | ICD-10-CM | POA: Diagnosis not present

## 2016-02-28 DIAGNOSIS — I679 Cerebrovascular disease, unspecified: Secondary | ICD-10-CM | POA: Diagnosis not present

## 2016-02-28 DIAGNOSIS — S299XXA Unspecified injury of thorax, initial encounter: Secondary | ICD-10-CM | POA: Diagnosis not present

## 2016-02-28 DIAGNOSIS — J449 Chronic obstructive pulmonary disease, unspecified: Secondary | ICD-10-CM | POA: Diagnosis not present

## 2016-02-28 DIAGNOSIS — J189 Pneumonia, unspecified organism: Secondary | ICD-10-CM | POA: Diagnosis not present

## 2016-02-29 DIAGNOSIS — I5030 Unspecified diastolic (congestive) heart failure: Secondary | ICD-10-CM | POA: Diagnosis not present

## 2016-02-29 DIAGNOSIS — J449 Chronic obstructive pulmonary disease, unspecified: Secondary | ICD-10-CM | POA: Diagnosis not present

## 2016-02-29 DIAGNOSIS — I679 Cerebrovascular disease, unspecified: Secondary | ICD-10-CM | POA: Diagnosis not present

## 2016-02-29 DIAGNOSIS — S299XXA Unspecified injury of thorax, initial encounter: Secondary | ICD-10-CM | POA: Diagnosis not present

## 2016-02-29 DIAGNOSIS — J189 Pneumonia, unspecified organism: Secondary | ICD-10-CM | POA: Diagnosis not present

## 2016-02-29 DIAGNOSIS — S225XXB Flail chest, initial encounter for open fracture: Secondary | ICD-10-CM | POA: Diagnosis not present

## 2016-03-03 DIAGNOSIS — K219 Gastro-esophageal reflux disease without esophagitis: Secondary | ICD-10-CM | POA: Diagnosis not present

## 2016-03-03 DIAGNOSIS — S299XXA Unspecified injury of thorax, initial encounter: Secondary | ICD-10-CM | POA: Diagnosis not present

## 2016-03-03 DIAGNOSIS — I4891 Unspecified atrial fibrillation: Secondary | ICD-10-CM | POA: Diagnosis not present

## 2016-03-03 DIAGNOSIS — J9620 Acute and chronic respiratory failure, unspecified whether with hypoxia or hypercapnia: Secondary | ICD-10-CM | POA: Diagnosis not present

## 2016-03-03 DIAGNOSIS — F339 Major depressive disorder, recurrent, unspecified: Secondary | ICD-10-CM | POA: Diagnosis not present

## 2016-03-03 DIAGNOSIS — E039 Hypothyroidism, unspecified: Secondary | ICD-10-CM | POA: Diagnosis not present

## 2016-03-03 DIAGNOSIS — D500 Iron deficiency anemia secondary to blood loss (chronic): Secondary | ICD-10-CM | POA: Diagnosis not present

## 2016-03-03 DIAGNOSIS — S225XXB Flail chest, initial encounter for open fracture: Secondary | ICD-10-CM | POA: Diagnosis not present

## 2016-03-03 DIAGNOSIS — E46 Unspecified protein-calorie malnutrition: Secondary | ICD-10-CM | POA: Diagnosis not present

## 2016-03-03 DIAGNOSIS — I10 Essential (primary) hypertension: Secondary | ICD-10-CM | POA: Diagnosis not present

## 2016-03-03 DIAGNOSIS — I5030 Unspecified diastolic (congestive) heart failure: Secondary | ICD-10-CM | POA: Diagnosis not present

## 2016-03-03 DIAGNOSIS — J449 Chronic obstructive pulmonary disease, unspecified: Secondary | ICD-10-CM | POA: Diagnosis not present

## 2016-03-03 DIAGNOSIS — I679 Cerebrovascular disease, unspecified: Secondary | ICD-10-CM | POA: Diagnosis not present

## 2016-03-03 DIAGNOSIS — J189 Pneumonia, unspecified organism: Secondary | ICD-10-CM | POA: Diagnosis not present

## 2016-03-05 DIAGNOSIS — S225XXB Flail chest, initial encounter for open fracture: Secondary | ICD-10-CM | POA: Diagnosis not present

## 2016-03-05 DIAGNOSIS — J189 Pneumonia, unspecified organism: Secondary | ICD-10-CM | POA: Diagnosis not present

## 2016-03-05 DIAGNOSIS — S299XXA Unspecified injury of thorax, initial encounter: Secondary | ICD-10-CM | POA: Diagnosis not present

## 2016-03-05 DIAGNOSIS — I679 Cerebrovascular disease, unspecified: Secondary | ICD-10-CM | POA: Diagnosis not present

## 2016-03-05 DIAGNOSIS — J449 Chronic obstructive pulmonary disease, unspecified: Secondary | ICD-10-CM | POA: Diagnosis not present

## 2016-03-05 DIAGNOSIS — I5030 Unspecified diastolic (congestive) heart failure: Secondary | ICD-10-CM | POA: Diagnosis not present

## 2016-03-07 DIAGNOSIS — S225XXB Flail chest, initial encounter for open fracture: Secondary | ICD-10-CM | POA: Diagnosis not present

## 2016-03-07 DIAGNOSIS — I679 Cerebrovascular disease, unspecified: Secondary | ICD-10-CM | POA: Diagnosis not present

## 2016-03-07 DIAGNOSIS — I5030 Unspecified diastolic (congestive) heart failure: Secondary | ICD-10-CM | POA: Diagnosis not present

## 2016-03-07 DIAGNOSIS — J449 Chronic obstructive pulmonary disease, unspecified: Secondary | ICD-10-CM | POA: Diagnosis not present

## 2016-03-07 DIAGNOSIS — S299XXA Unspecified injury of thorax, initial encounter: Secondary | ICD-10-CM | POA: Diagnosis not present

## 2016-03-07 DIAGNOSIS — J189 Pneumonia, unspecified organism: Secondary | ICD-10-CM | POA: Diagnosis not present

## 2016-03-12 DIAGNOSIS — I5030 Unspecified diastolic (congestive) heart failure: Secondary | ICD-10-CM | POA: Diagnosis not present

## 2016-03-12 DIAGNOSIS — J449 Chronic obstructive pulmonary disease, unspecified: Secondary | ICD-10-CM | POA: Diagnosis not present

## 2016-03-12 DIAGNOSIS — J189 Pneumonia, unspecified organism: Secondary | ICD-10-CM | POA: Diagnosis not present

## 2016-03-12 DIAGNOSIS — I679 Cerebrovascular disease, unspecified: Secondary | ICD-10-CM | POA: Diagnosis not present

## 2016-03-12 DIAGNOSIS — S225XXB Flail chest, initial encounter for open fracture: Secondary | ICD-10-CM | POA: Diagnosis not present

## 2016-03-12 DIAGNOSIS — S299XXA Unspecified injury of thorax, initial encounter: Secondary | ICD-10-CM | POA: Diagnosis not present

## 2016-03-13 DIAGNOSIS — J449 Chronic obstructive pulmonary disease, unspecified: Secondary | ICD-10-CM | POA: Diagnosis not present

## 2016-03-13 DIAGNOSIS — S299XXA Unspecified injury of thorax, initial encounter: Secondary | ICD-10-CM | POA: Diagnosis not present

## 2016-03-13 DIAGNOSIS — I5030 Unspecified diastolic (congestive) heart failure: Secondary | ICD-10-CM | POA: Diagnosis not present

## 2016-03-13 DIAGNOSIS — I679 Cerebrovascular disease, unspecified: Secondary | ICD-10-CM | POA: Diagnosis not present

## 2016-03-13 DIAGNOSIS — J189 Pneumonia, unspecified organism: Secondary | ICD-10-CM | POA: Diagnosis not present

## 2016-03-13 DIAGNOSIS — S225XXB Flail chest, initial encounter for open fracture: Secondary | ICD-10-CM | POA: Diagnosis not present

## 2016-03-14 DIAGNOSIS — J189 Pneumonia, unspecified organism: Secondary | ICD-10-CM | POA: Diagnosis not present

## 2016-03-14 DIAGNOSIS — I5030 Unspecified diastolic (congestive) heart failure: Secondary | ICD-10-CM | POA: Diagnosis not present

## 2016-03-14 DIAGNOSIS — I679 Cerebrovascular disease, unspecified: Secondary | ICD-10-CM | POA: Diagnosis not present

## 2016-03-14 DIAGNOSIS — S225XXB Flail chest, initial encounter for open fracture: Secondary | ICD-10-CM | POA: Diagnosis not present

## 2016-03-14 DIAGNOSIS — S299XXA Unspecified injury of thorax, initial encounter: Secondary | ICD-10-CM | POA: Diagnosis not present

## 2016-03-14 DIAGNOSIS — J449 Chronic obstructive pulmonary disease, unspecified: Secondary | ICD-10-CM | POA: Diagnosis not present

## 2016-03-16 DIAGNOSIS — J189 Pneumonia, unspecified organism: Secondary | ICD-10-CM | POA: Diagnosis not present

## 2016-03-16 DIAGNOSIS — I679 Cerebrovascular disease, unspecified: Secondary | ICD-10-CM | POA: Diagnosis not present

## 2016-03-16 DIAGNOSIS — S299XXA Unspecified injury of thorax, initial encounter: Secondary | ICD-10-CM | POA: Diagnosis not present

## 2016-03-16 DIAGNOSIS — J449 Chronic obstructive pulmonary disease, unspecified: Secondary | ICD-10-CM | POA: Diagnosis not present

## 2016-03-16 DIAGNOSIS — I5030 Unspecified diastolic (congestive) heart failure: Secondary | ICD-10-CM | POA: Diagnosis not present

## 2016-03-16 DIAGNOSIS — S225XXB Flail chest, initial encounter for open fracture: Secondary | ICD-10-CM | POA: Diagnosis not present

## 2016-03-19 DIAGNOSIS — S225XXB Flail chest, initial encounter for open fracture: Secondary | ICD-10-CM | POA: Diagnosis not present

## 2016-03-19 DIAGNOSIS — I679 Cerebrovascular disease, unspecified: Secondary | ICD-10-CM | POA: Diagnosis not present

## 2016-03-19 DIAGNOSIS — J449 Chronic obstructive pulmonary disease, unspecified: Secondary | ICD-10-CM | POA: Diagnosis not present

## 2016-03-19 DIAGNOSIS — S299XXA Unspecified injury of thorax, initial encounter: Secondary | ICD-10-CM | POA: Diagnosis not present

## 2016-03-19 DIAGNOSIS — I5030 Unspecified diastolic (congestive) heart failure: Secondary | ICD-10-CM | POA: Diagnosis not present

## 2016-03-19 DIAGNOSIS — J189 Pneumonia, unspecified organism: Secondary | ICD-10-CM | POA: Diagnosis not present

## 2016-03-21 DIAGNOSIS — I679 Cerebrovascular disease, unspecified: Secondary | ICD-10-CM | POA: Diagnosis not present

## 2016-03-21 DIAGNOSIS — I5030 Unspecified diastolic (congestive) heart failure: Secondary | ICD-10-CM | POA: Diagnosis not present

## 2016-03-21 DIAGNOSIS — J189 Pneumonia, unspecified organism: Secondary | ICD-10-CM | POA: Diagnosis not present

## 2016-03-21 DIAGNOSIS — S225XXB Flail chest, initial encounter for open fracture: Secondary | ICD-10-CM | POA: Diagnosis not present

## 2016-03-21 DIAGNOSIS — J449 Chronic obstructive pulmonary disease, unspecified: Secondary | ICD-10-CM | POA: Diagnosis not present

## 2016-03-21 DIAGNOSIS — S299XXA Unspecified injury of thorax, initial encounter: Secondary | ICD-10-CM | POA: Diagnosis not present

## 2016-03-27 DIAGNOSIS — J189 Pneumonia, unspecified organism: Secondary | ICD-10-CM | POA: Diagnosis not present

## 2016-03-27 DIAGNOSIS — I679 Cerebrovascular disease, unspecified: Secondary | ICD-10-CM | POA: Diagnosis not present

## 2016-03-27 DIAGNOSIS — S299XXA Unspecified injury of thorax, initial encounter: Secondary | ICD-10-CM | POA: Diagnosis not present

## 2016-03-27 DIAGNOSIS — I5030 Unspecified diastolic (congestive) heart failure: Secondary | ICD-10-CM | POA: Diagnosis not present

## 2016-03-27 DIAGNOSIS — S225XXB Flail chest, initial encounter for open fracture: Secondary | ICD-10-CM | POA: Diagnosis not present

## 2016-03-27 DIAGNOSIS — J449 Chronic obstructive pulmonary disease, unspecified: Secondary | ICD-10-CM | POA: Diagnosis not present

## 2016-03-28 DIAGNOSIS — S225XXB Flail chest, initial encounter for open fracture: Secondary | ICD-10-CM | POA: Diagnosis not present

## 2016-03-28 DIAGNOSIS — I5030 Unspecified diastolic (congestive) heart failure: Secondary | ICD-10-CM | POA: Diagnosis not present

## 2016-03-28 DIAGNOSIS — J189 Pneumonia, unspecified organism: Secondary | ICD-10-CM | POA: Diagnosis not present

## 2016-03-28 DIAGNOSIS — J449 Chronic obstructive pulmonary disease, unspecified: Secondary | ICD-10-CM | POA: Diagnosis not present

## 2016-03-28 DIAGNOSIS — S299XXA Unspecified injury of thorax, initial encounter: Secondary | ICD-10-CM | POA: Diagnosis not present

## 2016-03-28 DIAGNOSIS — I679 Cerebrovascular disease, unspecified: Secondary | ICD-10-CM | POA: Diagnosis not present

## 2016-03-30 DIAGNOSIS — S225XXB Flail chest, initial encounter for open fracture: Secondary | ICD-10-CM | POA: Diagnosis not present

## 2016-03-30 DIAGNOSIS — I679 Cerebrovascular disease, unspecified: Secondary | ICD-10-CM | POA: Diagnosis not present

## 2016-03-30 DIAGNOSIS — I5030 Unspecified diastolic (congestive) heart failure: Secondary | ICD-10-CM | POA: Diagnosis not present

## 2016-03-30 DIAGNOSIS — J449 Chronic obstructive pulmonary disease, unspecified: Secondary | ICD-10-CM | POA: Diagnosis not present

## 2016-03-30 DIAGNOSIS — J189 Pneumonia, unspecified organism: Secondary | ICD-10-CM | POA: Diagnosis not present

## 2016-03-30 DIAGNOSIS — S299XXA Unspecified injury of thorax, initial encounter: Secondary | ICD-10-CM | POA: Diagnosis not present

## 2016-04-03 DIAGNOSIS — I10 Essential (primary) hypertension: Secondary | ICD-10-CM | POA: Diagnosis not present

## 2016-04-03 DIAGNOSIS — F339 Major depressive disorder, recurrent, unspecified: Secondary | ICD-10-CM | POA: Diagnosis not present

## 2016-04-03 DIAGNOSIS — J9620 Acute and chronic respiratory failure, unspecified whether with hypoxia or hypercapnia: Secondary | ICD-10-CM | POA: Diagnosis not present

## 2016-04-03 DIAGNOSIS — I679 Cerebrovascular disease, unspecified: Secondary | ICD-10-CM | POA: Diagnosis not present

## 2016-04-03 DIAGNOSIS — I4891 Unspecified atrial fibrillation: Secondary | ICD-10-CM | POA: Diagnosis not present

## 2016-04-03 DIAGNOSIS — I5030 Unspecified diastolic (congestive) heart failure: Secondary | ICD-10-CM | POA: Diagnosis not present

## 2016-04-03 DIAGNOSIS — D500 Iron deficiency anemia secondary to blood loss (chronic): Secondary | ICD-10-CM | POA: Diagnosis not present

## 2016-04-03 DIAGNOSIS — J449 Chronic obstructive pulmonary disease, unspecified: Secondary | ICD-10-CM | POA: Diagnosis not present

## 2016-04-03 DIAGNOSIS — E039 Hypothyroidism, unspecified: Secondary | ICD-10-CM | POA: Diagnosis not present

## 2016-04-03 DIAGNOSIS — S225XXB Flail chest, initial encounter for open fracture: Secondary | ICD-10-CM | POA: Diagnosis not present

## 2016-04-03 DIAGNOSIS — E46 Unspecified protein-calorie malnutrition: Secondary | ICD-10-CM | POA: Diagnosis not present

## 2016-04-03 DIAGNOSIS — S299XXA Unspecified injury of thorax, initial encounter: Secondary | ICD-10-CM | POA: Diagnosis not present

## 2016-04-03 DIAGNOSIS — J189 Pneumonia, unspecified organism: Secondary | ICD-10-CM | POA: Diagnosis not present

## 2016-04-03 DIAGNOSIS — K219 Gastro-esophageal reflux disease without esophagitis: Secondary | ICD-10-CM | POA: Diagnosis not present

## 2016-04-04 DIAGNOSIS — S225XXB Flail chest, initial encounter for open fracture: Secondary | ICD-10-CM | POA: Diagnosis not present

## 2016-04-04 DIAGNOSIS — I679 Cerebrovascular disease, unspecified: Secondary | ICD-10-CM | POA: Diagnosis not present

## 2016-04-04 DIAGNOSIS — S299XXA Unspecified injury of thorax, initial encounter: Secondary | ICD-10-CM | POA: Diagnosis not present

## 2016-04-04 DIAGNOSIS — J189 Pneumonia, unspecified organism: Secondary | ICD-10-CM | POA: Diagnosis not present

## 2016-04-04 DIAGNOSIS — I5030 Unspecified diastolic (congestive) heart failure: Secondary | ICD-10-CM | POA: Diagnosis not present

## 2016-04-04 DIAGNOSIS — J449 Chronic obstructive pulmonary disease, unspecified: Secondary | ICD-10-CM | POA: Diagnosis not present

## 2016-04-06 DIAGNOSIS — S225XXB Flail chest, initial encounter for open fracture: Secondary | ICD-10-CM | POA: Diagnosis not present

## 2016-04-06 DIAGNOSIS — I5030 Unspecified diastolic (congestive) heart failure: Secondary | ICD-10-CM | POA: Diagnosis not present

## 2016-04-06 DIAGNOSIS — J189 Pneumonia, unspecified organism: Secondary | ICD-10-CM | POA: Diagnosis not present

## 2016-04-06 DIAGNOSIS — S299XXA Unspecified injury of thorax, initial encounter: Secondary | ICD-10-CM | POA: Diagnosis not present

## 2016-04-06 DIAGNOSIS — I679 Cerebrovascular disease, unspecified: Secondary | ICD-10-CM | POA: Diagnosis not present

## 2016-04-06 DIAGNOSIS — J449 Chronic obstructive pulmonary disease, unspecified: Secondary | ICD-10-CM | POA: Diagnosis not present

## 2016-04-09 DIAGNOSIS — I5030 Unspecified diastolic (congestive) heart failure: Secondary | ICD-10-CM | POA: Diagnosis not present

## 2016-04-09 DIAGNOSIS — S225XXB Flail chest, initial encounter for open fracture: Secondary | ICD-10-CM | POA: Diagnosis not present

## 2016-04-09 DIAGNOSIS — I679 Cerebrovascular disease, unspecified: Secondary | ICD-10-CM | POA: Diagnosis not present

## 2016-04-09 DIAGNOSIS — S299XXA Unspecified injury of thorax, initial encounter: Secondary | ICD-10-CM | POA: Diagnosis not present

## 2016-04-09 DIAGNOSIS — J189 Pneumonia, unspecified organism: Secondary | ICD-10-CM | POA: Diagnosis not present

## 2016-04-09 DIAGNOSIS — J449 Chronic obstructive pulmonary disease, unspecified: Secondary | ICD-10-CM | POA: Diagnosis not present

## 2016-04-11 DIAGNOSIS — J449 Chronic obstructive pulmonary disease, unspecified: Secondary | ICD-10-CM | POA: Diagnosis not present

## 2016-04-11 DIAGNOSIS — J189 Pneumonia, unspecified organism: Secondary | ICD-10-CM | POA: Diagnosis not present

## 2016-04-11 DIAGNOSIS — I679 Cerebrovascular disease, unspecified: Secondary | ICD-10-CM | POA: Diagnosis not present

## 2016-04-11 DIAGNOSIS — S225XXB Flail chest, initial encounter for open fracture: Secondary | ICD-10-CM | POA: Diagnosis not present

## 2016-04-11 DIAGNOSIS — I5030 Unspecified diastolic (congestive) heart failure: Secondary | ICD-10-CM | POA: Diagnosis not present

## 2016-04-11 DIAGNOSIS — S299XXA Unspecified injury of thorax, initial encounter: Secondary | ICD-10-CM | POA: Diagnosis not present

## 2016-04-18 DIAGNOSIS — J189 Pneumonia, unspecified organism: Secondary | ICD-10-CM | POA: Diagnosis not present

## 2016-04-18 DIAGNOSIS — I5030 Unspecified diastolic (congestive) heart failure: Secondary | ICD-10-CM | POA: Diagnosis not present

## 2016-04-18 DIAGNOSIS — S299XXA Unspecified injury of thorax, initial encounter: Secondary | ICD-10-CM | POA: Diagnosis not present

## 2016-04-18 DIAGNOSIS — J449 Chronic obstructive pulmonary disease, unspecified: Secondary | ICD-10-CM | POA: Diagnosis not present

## 2016-04-18 DIAGNOSIS — I679 Cerebrovascular disease, unspecified: Secondary | ICD-10-CM | POA: Diagnosis not present

## 2016-04-18 DIAGNOSIS — S225XXB Flail chest, initial encounter for open fracture: Secondary | ICD-10-CM | POA: Diagnosis not present

## 2016-04-19 DIAGNOSIS — I5030 Unspecified diastolic (congestive) heart failure: Secondary | ICD-10-CM | POA: Diagnosis not present

## 2016-04-19 DIAGNOSIS — J449 Chronic obstructive pulmonary disease, unspecified: Secondary | ICD-10-CM | POA: Diagnosis not present

## 2016-04-19 DIAGNOSIS — S299XXA Unspecified injury of thorax, initial encounter: Secondary | ICD-10-CM | POA: Diagnosis not present

## 2016-04-19 DIAGNOSIS — I679 Cerebrovascular disease, unspecified: Secondary | ICD-10-CM | POA: Diagnosis not present

## 2016-04-19 DIAGNOSIS — J189 Pneumonia, unspecified organism: Secondary | ICD-10-CM | POA: Diagnosis not present

## 2016-04-19 DIAGNOSIS — S225XXB Flail chest, initial encounter for open fracture: Secondary | ICD-10-CM | POA: Diagnosis not present

## 2016-04-25 DIAGNOSIS — I679 Cerebrovascular disease, unspecified: Secondary | ICD-10-CM | POA: Diagnosis not present

## 2016-04-25 DIAGNOSIS — S299XXA Unspecified injury of thorax, initial encounter: Secondary | ICD-10-CM | POA: Diagnosis not present

## 2016-04-25 DIAGNOSIS — S225XXB Flail chest, initial encounter for open fracture: Secondary | ICD-10-CM | POA: Diagnosis not present

## 2016-04-25 DIAGNOSIS — J449 Chronic obstructive pulmonary disease, unspecified: Secondary | ICD-10-CM | POA: Diagnosis not present

## 2016-04-25 DIAGNOSIS — J189 Pneumonia, unspecified organism: Secondary | ICD-10-CM | POA: Diagnosis not present

## 2016-04-25 DIAGNOSIS — I5030 Unspecified diastolic (congestive) heart failure: Secondary | ICD-10-CM | POA: Diagnosis not present

## 2016-05-02 DIAGNOSIS — I5030 Unspecified diastolic (congestive) heart failure: Secondary | ICD-10-CM | POA: Diagnosis not present

## 2016-05-02 DIAGNOSIS — I679 Cerebrovascular disease, unspecified: Secondary | ICD-10-CM | POA: Diagnosis not present

## 2016-05-02 DIAGNOSIS — J449 Chronic obstructive pulmonary disease, unspecified: Secondary | ICD-10-CM | POA: Diagnosis not present

## 2016-05-02 DIAGNOSIS — S299XXA Unspecified injury of thorax, initial encounter: Secondary | ICD-10-CM | POA: Diagnosis not present

## 2016-05-02 DIAGNOSIS — S225XXB Flail chest, initial encounter for open fracture: Secondary | ICD-10-CM | POA: Diagnosis not present

## 2016-05-02 DIAGNOSIS — J189 Pneumonia, unspecified organism: Secondary | ICD-10-CM | POA: Diagnosis not present

## 2016-05-03 DIAGNOSIS — I679 Cerebrovascular disease, unspecified: Secondary | ICD-10-CM | POA: Diagnosis not present

## 2016-05-03 DIAGNOSIS — S225XXB Flail chest, initial encounter for open fracture: Secondary | ICD-10-CM | POA: Diagnosis not present

## 2016-05-03 DIAGNOSIS — J189 Pneumonia, unspecified organism: Secondary | ICD-10-CM | POA: Diagnosis not present

## 2016-05-03 DIAGNOSIS — I5030 Unspecified diastolic (congestive) heart failure: Secondary | ICD-10-CM | POA: Diagnosis not present

## 2016-05-03 DIAGNOSIS — S299XXA Unspecified injury of thorax, initial encounter: Secondary | ICD-10-CM | POA: Diagnosis not present

## 2016-05-03 DIAGNOSIS — J449 Chronic obstructive pulmonary disease, unspecified: Secondary | ICD-10-CM | POA: Diagnosis not present

## 2016-05-04 DIAGNOSIS — D500 Iron deficiency anemia secondary to blood loss (chronic): Secondary | ICD-10-CM | POA: Diagnosis not present

## 2016-05-04 DIAGNOSIS — I4891 Unspecified atrial fibrillation: Secondary | ICD-10-CM | POA: Diagnosis not present

## 2016-05-04 DIAGNOSIS — S225XXB Flail chest, initial encounter for open fracture: Secondary | ICD-10-CM | POA: Diagnosis not present

## 2016-05-04 DIAGNOSIS — I679 Cerebrovascular disease, unspecified: Secondary | ICD-10-CM | POA: Diagnosis not present

## 2016-05-04 DIAGNOSIS — J449 Chronic obstructive pulmonary disease, unspecified: Secondary | ICD-10-CM | POA: Diagnosis not present

## 2016-05-04 DIAGNOSIS — I10 Essential (primary) hypertension: Secondary | ICD-10-CM | POA: Diagnosis not present

## 2016-05-04 DIAGNOSIS — J189 Pneumonia, unspecified organism: Secondary | ICD-10-CM | POA: Diagnosis not present

## 2016-05-04 DIAGNOSIS — K219 Gastro-esophageal reflux disease without esophagitis: Secondary | ICD-10-CM | POA: Diagnosis not present

## 2016-05-04 DIAGNOSIS — E039 Hypothyroidism, unspecified: Secondary | ICD-10-CM | POA: Diagnosis not present

## 2016-05-04 DIAGNOSIS — S299XXA Unspecified injury of thorax, initial encounter: Secondary | ICD-10-CM | POA: Diagnosis not present

## 2016-05-04 DIAGNOSIS — I5030 Unspecified diastolic (congestive) heart failure: Secondary | ICD-10-CM | POA: Diagnosis not present

## 2016-05-04 DIAGNOSIS — E46 Unspecified protein-calorie malnutrition: Secondary | ICD-10-CM | POA: Diagnosis not present

## 2016-05-04 DIAGNOSIS — F339 Major depressive disorder, recurrent, unspecified: Secondary | ICD-10-CM | POA: Diagnosis not present

## 2016-05-04 DIAGNOSIS — J9620 Acute and chronic respiratory failure, unspecified whether with hypoxia or hypercapnia: Secondary | ICD-10-CM | POA: Diagnosis not present

## 2016-05-09 DIAGNOSIS — J189 Pneumonia, unspecified organism: Secondary | ICD-10-CM | POA: Diagnosis not present

## 2016-05-09 DIAGNOSIS — J449 Chronic obstructive pulmonary disease, unspecified: Secondary | ICD-10-CM | POA: Diagnosis not present

## 2016-05-09 DIAGNOSIS — I679 Cerebrovascular disease, unspecified: Secondary | ICD-10-CM | POA: Diagnosis not present

## 2016-05-09 DIAGNOSIS — S299XXA Unspecified injury of thorax, initial encounter: Secondary | ICD-10-CM | POA: Diagnosis not present

## 2016-05-09 DIAGNOSIS — S225XXB Flail chest, initial encounter for open fracture: Secondary | ICD-10-CM | POA: Diagnosis not present

## 2016-05-09 DIAGNOSIS — I5030 Unspecified diastolic (congestive) heart failure: Secondary | ICD-10-CM | POA: Diagnosis not present

## 2016-05-10 DIAGNOSIS — J189 Pneumonia, unspecified organism: Secondary | ICD-10-CM | POA: Diagnosis not present

## 2016-05-10 DIAGNOSIS — S299XXA Unspecified injury of thorax, initial encounter: Secondary | ICD-10-CM | POA: Diagnosis not present

## 2016-05-10 DIAGNOSIS — J449 Chronic obstructive pulmonary disease, unspecified: Secondary | ICD-10-CM | POA: Diagnosis not present

## 2016-05-10 DIAGNOSIS — S225XXB Flail chest, initial encounter for open fracture: Secondary | ICD-10-CM | POA: Diagnosis not present

## 2016-05-10 DIAGNOSIS — I679 Cerebrovascular disease, unspecified: Secondary | ICD-10-CM | POA: Diagnosis not present

## 2016-05-10 DIAGNOSIS — I5030 Unspecified diastolic (congestive) heart failure: Secondary | ICD-10-CM | POA: Diagnosis not present

## 2016-05-16 DIAGNOSIS — S299XXA Unspecified injury of thorax, initial encounter: Secondary | ICD-10-CM | POA: Diagnosis not present

## 2016-05-16 DIAGNOSIS — S225XXB Flail chest, initial encounter for open fracture: Secondary | ICD-10-CM | POA: Diagnosis not present

## 2016-05-16 DIAGNOSIS — I679 Cerebrovascular disease, unspecified: Secondary | ICD-10-CM | POA: Diagnosis not present

## 2016-05-16 DIAGNOSIS — I5030 Unspecified diastolic (congestive) heart failure: Secondary | ICD-10-CM | POA: Diagnosis not present

## 2016-05-16 DIAGNOSIS — J189 Pneumonia, unspecified organism: Secondary | ICD-10-CM | POA: Diagnosis not present

## 2016-05-16 DIAGNOSIS — J449 Chronic obstructive pulmonary disease, unspecified: Secondary | ICD-10-CM | POA: Diagnosis not present

## 2016-05-21 DIAGNOSIS — S225XXB Flail chest, initial encounter for open fracture: Secondary | ICD-10-CM | POA: Diagnosis not present

## 2016-05-21 DIAGNOSIS — I679 Cerebrovascular disease, unspecified: Secondary | ICD-10-CM | POA: Diagnosis not present

## 2016-05-21 DIAGNOSIS — I5030 Unspecified diastolic (congestive) heart failure: Secondary | ICD-10-CM | POA: Diagnosis not present

## 2016-05-21 DIAGNOSIS — J189 Pneumonia, unspecified organism: Secondary | ICD-10-CM | POA: Diagnosis not present

## 2016-05-21 DIAGNOSIS — J449 Chronic obstructive pulmonary disease, unspecified: Secondary | ICD-10-CM | POA: Diagnosis not present

## 2016-05-21 DIAGNOSIS — S299XXA Unspecified injury of thorax, initial encounter: Secondary | ICD-10-CM | POA: Diagnosis not present

## 2016-05-23 DIAGNOSIS — I679 Cerebrovascular disease, unspecified: Secondary | ICD-10-CM | POA: Diagnosis not present

## 2016-05-23 DIAGNOSIS — J189 Pneumonia, unspecified organism: Secondary | ICD-10-CM | POA: Diagnosis not present

## 2016-05-23 DIAGNOSIS — J449 Chronic obstructive pulmonary disease, unspecified: Secondary | ICD-10-CM | POA: Diagnosis not present

## 2016-05-23 DIAGNOSIS — S299XXA Unspecified injury of thorax, initial encounter: Secondary | ICD-10-CM | POA: Diagnosis not present

## 2016-05-23 DIAGNOSIS — I5030 Unspecified diastolic (congestive) heart failure: Secondary | ICD-10-CM | POA: Diagnosis not present

## 2016-05-23 DIAGNOSIS — S225XXB Flail chest, initial encounter for open fracture: Secondary | ICD-10-CM | POA: Diagnosis not present

## 2016-05-30 DIAGNOSIS — I5030 Unspecified diastolic (congestive) heart failure: Secondary | ICD-10-CM | POA: Diagnosis not present

## 2016-05-30 DIAGNOSIS — S225XXB Flail chest, initial encounter for open fracture: Secondary | ICD-10-CM | POA: Diagnosis not present

## 2016-05-30 DIAGNOSIS — J189 Pneumonia, unspecified organism: Secondary | ICD-10-CM | POA: Diagnosis not present

## 2016-05-30 DIAGNOSIS — J449 Chronic obstructive pulmonary disease, unspecified: Secondary | ICD-10-CM | POA: Diagnosis not present

## 2016-05-30 DIAGNOSIS — I679 Cerebrovascular disease, unspecified: Secondary | ICD-10-CM | POA: Diagnosis not present

## 2016-05-30 DIAGNOSIS — S299XXA Unspecified injury of thorax, initial encounter: Secondary | ICD-10-CM | POA: Diagnosis not present

## 2016-06-03 DIAGNOSIS — E46 Unspecified protein-calorie malnutrition: Secondary | ICD-10-CM | POA: Diagnosis not present

## 2016-06-03 DIAGNOSIS — I5030 Unspecified diastolic (congestive) heart failure: Secondary | ICD-10-CM | POA: Diagnosis not present

## 2016-06-03 DIAGNOSIS — I10 Essential (primary) hypertension: Secondary | ICD-10-CM | POA: Diagnosis not present

## 2016-06-03 DIAGNOSIS — E039 Hypothyroidism, unspecified: Secondary | ICD-10-CM | POA: Diagnosis not present

## 2016-06-03 DIAGNOSIS — K219 Gastro-esophageal reflux disease without esophagitis: Secondary | ICD-10-CM | POA: Diagnosis not present

## 2016-06-03 DIAGNOSIS — S299XXA Unspecified injury of thorax, initial encounter: Secondary | ICD-10-CM | POA: Diagnosis not present

## 2016-06-03 DIAGNOSIS — I679 Cerebrovascular disease, unspecified: Secondary | ICD-10-CM | POA: Diagnosis not present

## 2016-06-03 DIAGNOSIS — J9620 Acute and chronic respiratory failure, unspecified whether with hypoxia or hypercapnia: Secondary | ICD-10-CM | POA: Diagnosis not present

## 2016-06-03 DIAGNOSIS — S225XXB Flail chest, initial encounter for open fracture: Secondary | ICD-10-CM | POA: Diagnosis not present

## 2016-06-03 DIAGNOSIS — I4891 Unspecified atrial fibrillation: Secondary | ICD-10-CM | POA: Diagnosis not present

## 2016-06-03 DIAGNOSIS — J189 Pneumonia, unspecified organism: Secondary | ICD-10-CM | POA: Diagnosis not present

## 2016-06-03 DIAGNOSIS — F339 Major depressive disorder, recurrent, unspecified: Secondary | ICD-10-CM | POA: Diagnosis not present

## 2016-06-03 DIAGNOSIS — J449 Chronic obstructive pulmonary disease, unspecified: Secondary | ICD-10-CM | POA: Diagnosis not present

## 2016-06-03 DIAGNOSIS — D500 Iron deficiency anemia secondary to blood loss (chronic): Secondary | ICD-10-CM | POA: Diagnosis not present

## 2016-06-06 DIAGNOSIS — J189 Pneumonia, unspecified organism: Secondary | ICD-10-CM | POA: Diagnosis not present

## 2016-06-06 DIAGNOSIS — S225XXB Flail chest, initial encounter for open fracture: Secondary | ICD-10-CM | POA: Diagnosis not present

## 2016-06-06 DIAGNOSIS — I679 Cerebrovascular disease, unspecified: Secondary | ICD-10-CM | POA: Diagnosis not present

## 2016-06-06 DIAGNOSIS — I5030 Unspecified diastolic (congestive) heart failure: Secondary | ICD-10-CM | POA: Diagnosis not present

## 2016-06-06 DIAGNOSIS — S299XXA Unspecified injury of thorax, initial encounter: Secondary | ICD-10-CM | POA: Diagnosis not present

## 2016-06-06 DIAGNOSIS — J449 Chronic obstructive pulmonary disease, unspecified: Secondary | ICD-10-CM | POA: Diagnosis not present

## 2016-06-12 DIAGNOSIS — I5030 Unspecified diastolic (congestive) heart failure: Secondary | ICD-10-CM | POA: Diagnosis not present

## 2016-06-12 DIAGNOSIS — S299XXA Unspecified injury of thorax, initial encounter: Secondary | ICD-10-CM | POA: Diagnosis not present

## 2016-06-12 DIAGNOSIS — S225XXB Flail chest, initial encounter for open fracture: Secondary | ICD-10-CM | POA: Diagnosis not present

## 2016-06-12 DIAGNOSIS — J189 Pneumonia, unspecified organism: Secondary | ICD-10-CM | POA: Diagnosis not present

## 2016-06-12 DIAGNOSIS — J449 Chronic obstructive pulmonary disease, unspecified: Secondary | ICD-10-CM | POA: Diagnosis not present

## 2016-06-12 DIAGNOSIS — I679 Cerebrovascular disease, unspecified: Secondary | ICD-10-CM | POA: Diagnosis not present

## 2016-06-13 DIAGNOSIS — I679 Cerebrovascular disease, unspecified: Secondary | ICD-10-CM | POA: Diagnosis not present

## 2016-06-13 DIAGNOSIS — S225XXB Flail chest, initial encounter for open fracture: Secondary | ICD-10-CM | POA: Diagnosis not present

## 2016-06-13 DIAGNOSIS — I5030 Unspecified diastolic (congestive) heart failure: Secondary | ICD-10-CM | POA: Diagnosis not present

## 2016-06-13 DIAGNOSIS — S299XXA Unspecified injury of thorax, initial encounter: Secondary | ICD-10-CM | POA: Diagnosis not present

## 2016-06-13 DIAGNOSIS — J449 Chronic obstructive pulmonary disease, unspecified: Secondary | ICD-10-CM | POA: Diagnosis not present

## 2016-06-13 DIAGNOSIS — J189 Pneumonia, unspecified organism: Secondary | ICD-10-CM | POA: Diagnosis not present

## 2016-06-18 DIAGNOSIS — I679 Cerebrovascular disease, unspecified: Secondary | ICD-10-CM | POA: Diagnosis not present

## 2016-06-18 DIAGNOSIS — J449 Chronic obstructive pulmonary disease, unspecified: Secondary | ICD-10-CM | POA: Diagnosis not present

## 2016-06-18 DIAGNOSIS — S299XXA Unspecified injury of thorax, initial encounter: Secondary | ICD-10-CM | POA: Diagnosis not present

## 2016-06-18 DIAGNOSIS — I5030 Unspecified diastolic (congestive) heart failure: Secondary | ICD-10-CM | POA: Diagnosis not present

## 2016-06-18 DIAGNOSIS — S225XXB Flail chest, initial encounter for open fracture: Secondary | ICD-10-CM | POA: Diagnosis not present

## 2016-06-18 DIAGNOSIS — J189 Pneumonia, unspecified organism: Secondary | ICD-10-CM | POA: Diagnosis not present

## 2016-06-20 DIAGNOSIS — J189 Pneumonia, unspecified organism: Secondary | ICD-10-CM | POA: Diagnosis not present

## 2016-06-20 DIAGNOSIS — I5030 Unspecified diastolic (congestive) heart failure: Secondary | ICD-10-CM | POA: Diagnosis not present

## 2016-06-20 DIAGNOSIS — S299XXA Unspecified injury of thorax, initial encounter: Secondary | ICD-10-CM | POA: Diagnosis not present

## 2016-06-20 DIAGNOSIS — I679 Cerebrovascular disease, unspecified: Secondary | ICD-10-CM | POA: Diagnosis not present

## 2016-06-20 DIAGNOSIS — S225XXB Flail chest, initial encounter for open fracture: Secondary | ICD-10-CM | POA: Diagnosis not present

## 2016-06-20 DIAGNOSIS — J449 Chronic obstructive pulmonary disease, unspecified: Secondary | ICD-10-CM | POA: Diagnosis not present

## 2016-06-23 DIAGNOSIS — I5030 Unspecified diastolic (congestive) heart failure: Secondary | ICD-10-CM | POA: Diagnosis not present

## 2016-06-23 DIAGNOSIS — I679 Cerebrovascular disease, unspecified: Secondary | ICD-10-CM | POA: Diagnosis not present

## 2016-06-23 DIAGNOSIS — J189 Pneumonia, unspecified organism: Secondary | ICD-10-CM | POA: Diagnosis not present

## 2016-06-23 DIAGNOSIS — J449 Chronic obstructive pulmonary disease, unspecified: Secondary | ICD-10-CM | POA: Diagnosis not present

## 2016-06-23 DIAGNOSIS — S225XXB Flail chest, initial encounter for open fracture: Secondary | ICD-10-CM | POA: Diagnosis not present

## 2016-06-23 DIAGNOSIS — S299XXA Unspecified injury of thorax, initial encounter: Secondary | ICD-10-CM | POA: Diagnosis not present

## 2016-06-27 DIAGNOSIS — I5030 Unspecified diastolic (congestive) heart failure: Secondary | ICD-10-CM | POA: Diagnosis not present

## 2016-06-27 DIAGNOSIS — I679 Cerebrovascular disease, unspecified: Secondary | ICD-10-CM | POA: Diagnosis not present

## 2016-06-27 DIAGNOSIS — J189 Pneumonia, unspecified organism: Secondary | ICD-10-CM | POA: Diagnosis not present

## 2016-06-27 DIAGNOSIS — S299XXA Unspecified injury of thorax, initial encounter: Secondary | ICD-10-CM | POA: Diagnosis not present

## 2016-06-27 DIAGNOSIS — J449 Chronic obstructive pulmonary disease, unspecified: Secondary | ICD-10-CM | POA: Diagnosis not present

## 2016-06-27 DIAGNOSIS — S225XXB Flail chest, initial encounter for open fracture: Secondary | ICD-10-CM | POA: Diagnosis not present

## 2016-07-02 DIAGNOSIS — I5030 Unspecified diastolic (congestive) heart failure: Secondary | ICD-10-CM | POA: Diagnosis not present

## 2016-07-02 DIAGNOSIS — S299XXA Unspecified injury of thorax, initial encounter: Secondary | ICD-10-CM | POA: Diagnosis not present

## 2016-07-02 DIAGNOSIS — I679 Cerebrovascular disease, unspecified: Secondary | ICD-10-CM | POA: Diagnosis not present

## 2016-07-02 DIAGNOSIS — J449 Chronic obstructive pulmonary disease, unspecified: Secondary | ICD-10-CM | POA: Diagnosis not present

## 2016-07-02 DIAGNOSIS — S225XXB Flail chest, initial encounter for open fracture: Secondary | ICD-10-CM | POA: Diagnosis not present

## 2016-07-02 DIAGNOSIS — J189 Pneumonia, unspecified organism: Secondary | ICD-10-CM | POA: Diagnosis not present

## 2016-07-04 DIAGNOSIS — K219 Gastro-esophageal reflux disease without esophagitis: Secondary | ICD-10-CM | POA: Diagnosis not present

## 2016-07-04 DIAGNOSIS — I10 Essential (primary) hypertension: Secondary | ICD-10-CM | POA: Diagnosis not present

## 2016-07-04 DIAGNOSIS — D500 Iron deficiency anemia secondary to blood loss (chronic): Secondary | ICD-10-CM | POA: Diagnosis not present

## 2016-07-04 DIAGNOSIS — S225XXB Flail chest, initial encounter for open fracture: Secondary | ICD-10-CM | POA: Diagnosis not present

## 2016-07-04 DIAGNOSIS — E039 Hypothyroidism, unspecified: Secondary | ICD-10-CM | POA: Diagnosis not present

## 2016-07-04 DIAGNOSIS — I679 Cerebrovascular disease, unspecified: Secondary | ICD-10-CM | POA: Diagnosis not present

## 2016-07-04 DIAGNOSIS — F339 Major depressive disorder, recurrent, unspecified: Secondary | ICD-10-CM | POA: Diagnosis not present

## 2016-07-04 DIAGNOSIS — J9620 Acute and chronic respiratory failure, unspecified whether with hypoxia or hypercapnia: Secondary | ICD-10-CM | POA: Diagnosis not present

## 2016-07-04 DIAGNOSIS — I5030 Unspecified diastolic (congestive) heart failure: Secondary | ICD-10-CM | POA: Diagnosis not present

## 2016-07-04 DIAGNOSIS — J189 Pneumonia, unspecified organism: Secondary | ICD-10-CM | POA: Diagnosis not present

## 2016-07-04 DIAGNOSIS — S299XXA Unspecified injury of thorax, initial encounter: Secondary | ICD-10-CM | POA: Diagnosis not present

## 2016-07-04 DIAGNOSIS — I4891 Unspecified atrial fibrillation: Secondary | ICD-10-CM | POA: Diagnosis not present

## 2016-07-04 DIAGNOSIS — E46 Unspecified protein-calorie malnutrition: Secondary | ICD-10-CM | POA: Diagnosis not present

## 2016-07-04 DIAGNOSIS — J449 Chronic obstructive pulmonary disease, unspecified: Secondary | ICD-10-CM | POA: Diagnosis not present

## 2016-07-09 DIAGNOSIS — I5030 Unspecified diastolic (congestive) heart failure: Secondary | ICD-10-CM | POA: Diagnosis not present

## 2016-07-09 DIAGNOSIS — J189 Pneumonia, unspecified organism: Secondary | ICD-10-CM | POA: Diagnosis not present

## 2016-07-09 DIAGNOSIS — J449 Chronic obstructive pulmonary disease, unspecified: Secondary | ICD-10-CM | POA: Diagnosis not present

## 2016-07-09 DIAGNOSIS — S225XXB Flail chest, initial encounter for open fracture: Secondary | ICD-10-CM | POA: Diagnosis not present

## 2016-07-09 DIAGNOSIS — I679 Cerebrovascular disease, unspecified: Secondary | ICD-10-CM | POA: Diagnosis not present

## 2016-07-09 DIAGNOSIS — S299XXA Unspecified injury of thorax, initial encounter: Secondary | ICD-10-CM | POA: Diagnosis not present

## 2016-07-10 DIAGNOSIS — J449 Chronic obstructive pulmonary disease, unspecified: Secondary | ICD-10-CM | POA: Diagnosis not present

## 2016-07-10 DIAGNOSIS — J189 Pneumonia, unspecified organism: Secondary | ICD-10-CM | POA: Diagnosis not present

## 2016-07-10 DIAGNOSIS — I5030 Unspecified diastolic (congestive) heart failure: Secondary | ICD-10-CM | POA: Diagnosis not present

## 2016-07-10 DIAGNOSIS — I679 Cerebrovascular disease, unspecified: Secondary | ICD-10-CM | POA: Diagnosis not present

## 2016-07-10 DIAGNOSIS — S225XXB Flail chest, initial encounter for open fracture: Secondary | ICD-10-CM | POA: Diagnosis not present

## 2016-07-10 DIAGNOSIS — S299XXA Unspecified injury of thorax, initial encounter: Secondary | ICD-10-CM | POA: Diagnosis not present

## 2016-07-10 IMAGING — CR DG PORTABLE PELVIS
1 series · 1 of 1 positions shown · non-contrast
Comparison: None.

CLINICAL DATA: Status post right hip replacement

EXAM:
PORTABLE PELVIS 1-2 VIEWS

[AP]
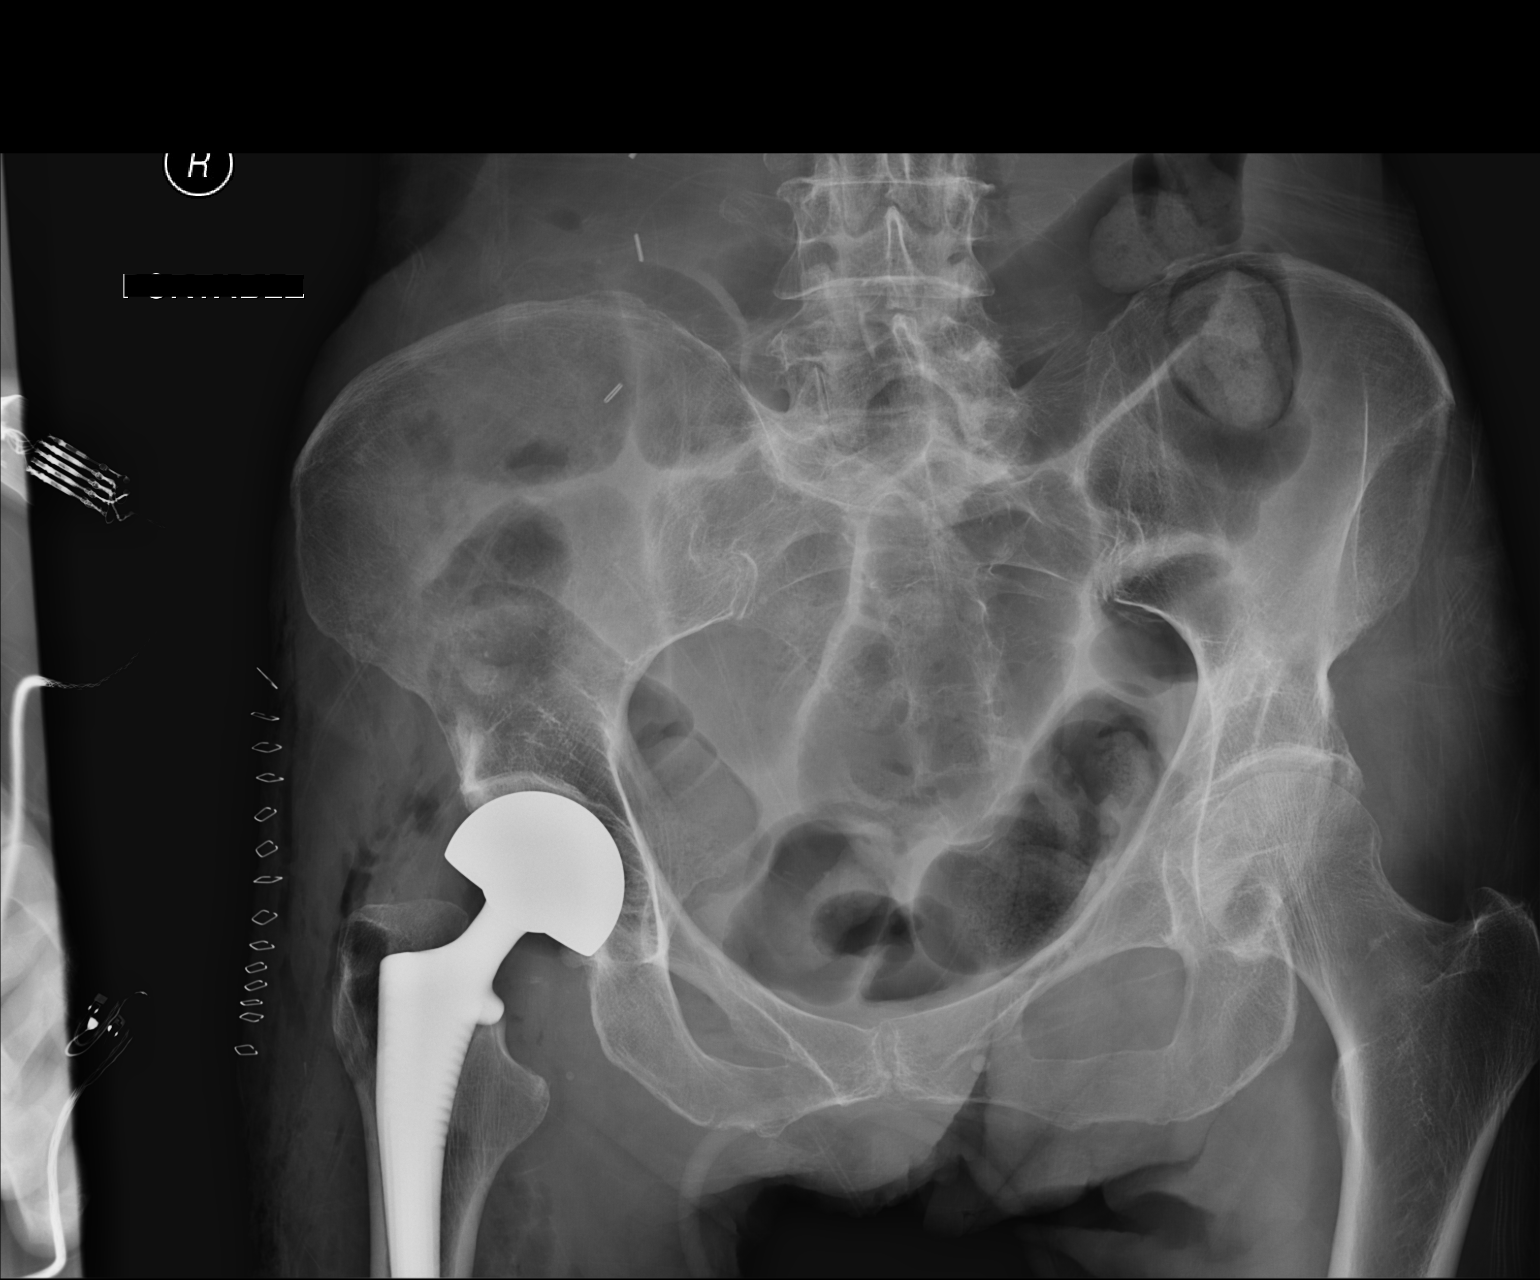

[1 of 1 positions shown; findings below may reference images not displayed]

FINDINGS: Right hip replacement is now seen. Air is noted in the surgical bed.
No acute bony abnormality is seen. No soft tissue changes are noted.
IMPRESSION: Status post right hip replacement

## 2016-07-11 DIAGNOSIS — I5030 Unspecified diastolic (congestive) heart failure: Secondary | ICD-10-CM | POA: Diagnosis not present

## 2016-07-11 DIAGNOSIS — J449 Chronic obstructive pulmonary disease, unspecified: Secondary | ICD-10-CM | POA: Diagnosis not present

## 2016-07-11 DIAGNOSIS — I679 Cerebrovascular disease, unspecified: Secondary | ICD-10-CM | POA: Diagnosis not present

## 2016-07-11 DIAGNOSIS — S299XXA Unspecified injury of thorax, initial encounter: Secondary | ICD-10-CM | POA: Diagnosis not present

## 2016-07-11 DIAGNOSIS — J189 Pneumonia, unspecified organism: Secondary | ICD-10-CM | POA: Diagnosis not present

## 2016-07-11 DIAGNOSIS — S225XXB Flail chest, initial encounter for open fracture: Secondary | ICD-10-CM | POA: Diagnosis not present

## 2016-07-12 DIAGNOSIS — I5030 Unspecified diastolic (congestive) heart failure: Secondary | ICD-10-CM | POA: Diagnosis not present

## 2016-07-12 DIAGNOSIS — I679 Cerebrovascular disease, unspecified: Secondary | ICD-10-CM | POA: Diagnosis not present

## 2016-07-12 DIAGNOSIS — J449 Chronic obstructive pulmonary disease, unspecified: Secondary | ICD-10-CM | POA: Diagnosis not present

## 2016-07-12 DIAGNOSIS — S299XXA Unspecified injury of thorax, initial encounter: Secondary | ICD-10-CM | POA: Diagnosis not present

## 2016-07-12 DIAGNOSIS — J189 Pneumonia, unspecified organism: Secondary | ICD-10-CM | POA: Diagnosis not present

## 2016-07-12 DIAGNOSIS — S225XXB Flail chest, initial encounter for open fracture: Secondary | ICD-10-CM | POA: Diagnosis not present

## 2016-07-18 DIAGNOSIS — I679 Cerebrovascular disease, unspecified: Secondary | ICD-10-CM | POA: Diagnosis not present

## 2016-07-18 DIAGNOSIS — J449 Chronic obstructive pulmonary disease, unspecified: Secondary | ICD-10-CM | POA: Diagnosis not present

## 2016-07-18 DIAGNOSIS — S299XXA Unspecified injury of thorax, initial encounter: Secondary | ICD-10-CM | POA: Diagnosis not present

## 2016-07-18 DIAGNOSIS — J189 Pneumonia, unspecified organism: Secondary | ICD-10-CM | POA: Diagnosis not present

## 2016-07-18 DIAGNOSIS — S225XXB Flail chest, initial encounter for open fracture: Secondary | ICD-10-CM | POA: Diagnosis not present

## 2016-07-18 DIAGNOSIS — I5030 Unspecified diastolic (congestive) heart failure: Secondary | ICD-10-CM | POA: Diagnosis not present

## 2016-07-24 DIAGNOSIS — J189 Pneumonia, unspecified organism: Secondary | ICD-10-CM | POA: Diagnosis not present

## 2016-07-24 DIAGNOSIS — J449 Chronic obstructive pulmonary disease, unspecified: Secondary | ICD-10-CM | POA: Diagnosis not present

## 2016-07-24 DIAGNOSIS — S299XXA Unspecified injury of thorax, initial encounter: Secondary | ICD-10-CM | POA: Diagnosis not present

## 2016-07-24 DIAGNOSIS — S225XXB Flail chest, initial encounter for open fracture: Secondary | ICD-10-CM | POA: Diagnosis not present

## 2016-07-24 DIAGNOSIS — I679 Cerebrovascular disease, unspecified: Secondary | ICD-10-CM | POA: Diagnosis not present

## 2016-07-24 DIAGNOSIS — I5030 Unspecified diastolic (congestive) heart failure: Secondary | ICD-10-CM | POA: Diagnosis not present

## 2016-07-25 DIAGNOSIS — S299XXA Unspecified injury of thorax, initial encounter: Secondary | ICD-10-CM | POA: Diagnosis not present

## 2016-07-25 DIAGNOSIS — I679 Cerebrovascular disease, unspecified: Secondary | ICD-10-CM | POA: Diagnosis not present

## 2016-07-25 DIAGNOSIS — J189 Pneumonia, unspecified organism: Secondary | ICD-10-CM | POA: Diagnosis not present

## 2016-07-25 DIAGNOSIS — S225XXB Flail chest, initial encounter for open fracture: Secondary | ICD-10-CM | POA: Diagnosis not present

## 2016-07-25 DIAGNOSIS — J449 Chronic obstructive pulmonary disease, unspecified: Secondary | ICD-10-CM | POA: Diagnosis not present

## 2016-07-25 DIAGNOSIS — I5030 Unspecified diastolic (congestive) heart failure: Secondary | ICD-10-CM | POA: Diagnosis not present

## 2016-08-01 DIAGNOSIS — I5030 Unspecified diastolic (congestive) heart failure: Secondary | ICD-10-CM | POA: Diagnosis not present

## 2016-08-01 DIAGNOSIS — S225XXB Flail chest, initial encounter for open fracture: Secondary | ICD-10-CM | POA: Diagnosis not present

## 2016-08-01 DIAGNOSIS — S299XXA Unspecified injury of thorax, initial encounter: Secondary | ICD-10-CM | POA: Diagnosis not present

## 2016-08-01 DIAGNOSIS — I679 Cerebrovascular disease, unspecified: Secondary | ICD-10-CM | POA: Diagnosis not present

## 2016-08-01 DIAGNOSIS — J189 Pneumonia, unspecified organism: Secondary | ICD-10-CM | POA: Diagnosis not present

## 2016-08-01 DIAGNOSIS — J449 Chronic obstructive pulmonary disease, unspecified: Secondary | ICD-10-CM | POA: Diagnosis not present

## 2016-08-03 DIAGNOSIS — I4891 Unspecified atrial fibrillation: Secondary | ICD-10-CM | POA: Diagnosis not present

## 2016-08-03 DIAGNOSIS — K219 Gastro-esophageal reflux disease without esophagitis: Secondary | ICD-10-CM | POA: Diagnosis not present

## 2016-08-03 DIAGNOSIS — S225XXB Flail chest, initial encounter for open fracture: Secondary | ICD-10-CM | POA: Diagnosis not present

## 2016-08-03 DIAGNOSIS — I679 Cerebrovascular disease, unspecified: Secondary | ICD-10-CM | POA: Diagnosis not present

## 2016-08-03 DIAGNOSIS — E46 Unspecified protein-calorie malnutrition: Secondary | ICD-10-CM | POA: Diagnosis not present

## 2016-08-03 DIAGNOSIS — F339 Major depressive disorder, recurrent, unspecified: Secondary | ICD-10-CM | POA: Diagnosis not present

## 2016-08-03 DIAGNOSIS — J189 Pneumonia, unspecified organism: Secondary | ICD-10-CM | POA: Diagnosis not present

## 2016-08-03 DIAGNOSIS — J449 Chronic obstructive pulmonary disease, unspecified: Secondary | ICD-10-CM | POA: Diagnosis not present

## 2016-08-03 DIAGNOSIS — J9620 Acute and chronic respiratory failure, unspecified whether with hypoxia or hypercapnia: Secondary | ICD-10-CM | POA: Diagnosis not present

## 2016-08-03 DIAGNOSIS — S299XXA Unspecified injury of thorax, initial encounter: Secondary | ICD-10-CM | POA: Diagnosis not present

## 2016-08-03 DIAGNOSIS — E039 Hypothyroidism, unspecified: Secondary | ICD-10-CM | POA: Diagnosis not present

## 2016-08-03 DIAGNOSIS — D500 Iron deficiency anemia secondary to blood loss (chronic): Secondary | ICD-10-CM | POA: Diagnosis not present

## 2016-08-03 DIAGNOSIS — I10 Essential (primary) hypertension: Secondary | ICD-10-CM | POA: Diagnosis not present

## 2016-08-03 DIAGNOSIS — I5030 Unspecified diastolic (congestive) heart failure: Secondary | ICD-10-CM | POA: Diagnosis not present

## 2016-08-08 DIAGNOSIS — S299XXA Unspecified injury of thorax, initial encounter: Secondary | ICD-10-CM | POA: Diagnosis not present

## 2016-08-08 DIAGNOSIS — J189 Pneumonia, unspecified organism: Secondary | ICD-10-CM | POA: Diagnosis not present

## 2016-08-08 DIAGNOSIS — J449 Chronic obstructive pulmonary disease, unspecified: Secondary | ICD-10-CM | POA: Diagnosis not present

## 2016-08-08 DIAGNOSIS — S225XXB Flail chest, initial encounter for open fracture: Secondary | ICD-10-CM | POA: Diagnosis not present

## 2016-08-08 DIAGNOSIS — I679 Cerebrovascular disease, unspecified: Secondary | ICD-10-CM | POA: Diagnosis not present

## 2016-08-08 DIAGNOSIS — I5030 Unspecified diastolic (congestive) heart failure: Secondary | ICD-10-CM | POA: Diagnosis not present

## 2016-08-09 DIAGNOSIS — I679 Cerebrovascular disease, unspecified: Secondary | ICD-10-CM | POA: Diagnosis not present

## 2016-08-09 DIAGNOSIS — I5030 Unspecified diastolic (congestive) heart failure: Secondary | ICD-10-CM | POA: Diagnosis not present

## 2016-08-09 DIAGNOSIS — S225XXB Flail chest, initial encounter for open fracture: Secondary | ICD-10-CM | POA: Diagnosis not present

## 2016-08-09 DIAGNOSIS — J449 Chronic obstructive pulmonary disease, unspecified: Secondary | ICD-10-CM | POA: Diagnosis not present

## 2016-08-09 DIAGNOSIS — J189 Pneumonia, unspecified organism: Secondary | ICD-10-CM | POA: Diagnosis not present

## 2016-08-09 DIAGNOSIS — S299XXA Unspecified injury of thorax, initial encounter: Secondary | ICD-10-CM | POA: Diagnosis not present

## 2016-08-15 DIAGNOSIS — I5030 Unspecified diastolic (congestive) heart failure: Secondary | ICD-10-CM | POA: Diagnosis not present

## 2016-08-15 DIAGNOSIS — J449 Chronic obstructive pulmonary disease, unspecified: Secondary | ICD-10-CM | POA: Diagnosis not present

## 2016-08-15 DIAGNOSIS — S299XXA Unspecified injury of thorax, initial encounter: Secondary | ICD-10-CM | POA: Diagnosis not present

## 2016-08-15 DIAGNOSIS — I679 Cerebrovascular disease, unspecified: Secondary | ICD-10-CM | POA: Diagnosis not present

## 2016-08-15 DIAGNOSIS — S225XXB Flail chest, initial encounter for open fracture: Secondary | ICD-10-CM | POA: Diagnosis not present

## 2016-08-15 DIAGNOSIS — J189 Pneumonia, unspecified organism: Secondary | ICD-10-CM | POA: Diagnosis not present

## 2016-08-22 DIAGNOSIS — S225XXB Flail chest, initial encounter for open fracture: Secondary | ICD-10-CM | POA: Diagnosis not present

## 2016-08-22 DIAGNOSIS — I5030 Unspecified diastolic (congestive) heart failure: Secondary | ICD-10-CM | POA: Diagnosis not present

## 2016-08-22 DIAGNOSIS — I679 Cerebrovascular disease, unspecified: Secondary | ICD-10-CM | POA: Diagnosis not present

## 2016-08-22 DIAGNOSIS — S299XXA Unspecified injury of thorax, initial encounter: Secondary | ICD-10-CM | POA: Diagnosis not present

## 2016-08-22 DIAGNOSIS — J189 Pneumonia, unspecified organism: Secondary | ICD-10-CM | POA: Diagnosis not present

## 2016-08-22 DIAGNOSIS — J449 Chronic obstructive pulmonary disease, unspecified: Secondary | ICD-10-CM | POA: Diagnosis not present

## 2016-08-24 DIAGNOSIS — I679 Cerebrovascular disease, unspecified: Secondary | ICD-10-CM | POA: Diagnosis not present

## 2016-08-24 DIAGNOSIS — S225XXB Flail chest, initial encounter for open fracture: Secondary | ICD-10-CM | POA: Diagnosis not present

## 2016-08-24 DIAGNOSIS — S299XXA Unspecified injury of thorax, initial encounter: Secondary | ICD-10-CM | POA: Diagnosis not present

## 2016-08-24 DIAGNOSIS — I5030 Unspecified diastolic (congestive) heart failure: Secondary | ICD-10-CM | POA: Diagnosis not present

## 2016-08-24 DIAGNOSIS — J189 Pneumonia, unspecified organism: Secondary | ICD-10-CM | POA: Diagnosis not present

## 2016-08-24 DIAGNOSIS — J449 Chronic obstructive pulmonary disease, unspecified: Secondary | ICD-10-CM | POA: Diagnosis not present

## 2016-08-29 DIAGNOSIS — J449 Chronic obstructive pulmonary disease, unspecified: Secondary | ICD-10-CM | POA: Diagnosis not present

## 2016-08-29 DIAGNOSIS — I5030 Unspecified diastolic (congestive) heart failure: Secondary | ICD-10-CM | POA: Diagnosis not present

## 2016-08-29 DIAGNOSIS — I679 Cerebrovascular disease, unspecified: Secondary | ICD-10-CM | POA: Diagnosis not present

## 2016-08-29 DIAGNOSIS — S299XXA Unspecified injury of thorax, initial encounter: Secondary | ICD-10-CM | POA: Diagnosis not present

## 2016-08-29 DIAGNOSIS — J189 Pneumonia, unspecified organism: Secondary | ICD-10-CM | POA: Diagnosis not present

## 2016-08-29 DIAGNOSIS — S225XXB Flail chest, initial encounter for open fracture: Secondary | ICD-10-CM | POA: Diagnosis not present

## 2016-09-03 DIAGNOSIS — J189 Pneumonia, unspecified organism: Secondary | ICD-10-CM | POA: Diagnosis not present

## 2016-09-03 DIAGNOSIS — J449 Chronic obstructive pulmonary disease, unspecified: Secondary | ICD-10-CM | POA: Diagnosis not present

## 2016-09-03 DIAGNOSIS — E039 Hypothyroidism, unspecified: Secondary | ICD-10-CM | POA: Diagnosis not present

## 2016-09-03 DIAGNOSIS — D500 Iron deficiency anemia secondary to blood loss (chronic): Secondary | ICD-10-CM | POA: Diagnosis not present

## 2016-09-03 DIAGNOSIS — S299XXA Unspecified injury of thorax, initial encounter: Secondary | ICD-10-CM | POA: Diagnosis not present

## 2016-09-03 DIAGNOSIS — J9620 Acute and chronic respiratory failure, unspecified whether with hypoxia or hypercapnia: Secondary | ICD-10-CM | POA: Diagnosis not present

## 2016-09-03 DIAGNOSIS — K219 Gastro-esophageal reflux disease without esophagitis: Secondary | ICD-10-CM | POA: Diagnosis not present

## 2016-09-03 DIAGNOSIS — I5030 Unspecified diastolic (congestive) heart failure: Secondary | ICD-10-CM | POA: Diagnosis not present

## 2016-09-03 DIAGNOSIS — I4891 Unspecified atrial fibrillation: Secondary | ICD-10-CM | POA: Diagnosis not present

## 2016-09-03 DIAGNOSIS — I679 Cerebrovascular disease, unspecified: Secondary | ICD-10-CM | POA: Diagnosis not present

## 2016-09-03 DIAGNOSIS — I10 Essential (primary) hypertension: Secondary | ICD-10-CM | POA: Diagnosis not present

## 2016-09-03 DIAGNOSIS — E46 Unspecified protein-calorie malnutrition: Secondary | ICD-10-CM | POA: Diagnosis not present

## 2016-09-03 DIAGNOSIS — F339 Major depressive disorder, recurrent, unspecified: Secondary | ICD-10-CM | POA: Diagnosis not present

## 2016-09-03 DIAGNOSIS — S225XXB Flail chest, initial encounter for open fracture: Secondary | ICD-10-CM | POA: Diagnosis not present

## 2016-09-05 DIAGNOSIS — S225XXB Flail chest, initial encounter for open fracture: Secondary | ICD-10-CM | POA: Diagnosis not present

## 2016-09-05 DIAGNOSIS — J449 Chronic obstructive pulmonary disease, unspecified: Secondary | ICD-10-CM | POA: Diagnosis not present

## 2016-09-05 DIAGNOSIS — S299XXA Unspecified injury of thorax, initial encounter: Secondary | ICD-10-CM | POA: Diagnosis not present

## 2016-09-05 DIAGNOSIS — I5030 Unspecified diastolic (congestive) heart failure: Secondary | ICD-10-CM | POA: Diagnosis not present

## 2016-09-05 DIAGNOSIS — J189 Pneumonia, unspecified organism: Secondary | ICD-10-CM | POA: Diagnosis not present

## 2016-09-05 DIAGNOSIS — I679 Cerebrovascular disease, unspecified: Secondary | ICD-10-CM | POA: Diagnosis not present

## 2016-09-12 DIAGNOSIS — I5030 Unspecified diastolic (congestive) heart failure: Secondary | ICD-10-CM | POA: Diagnosis not present

## 2016-09-12 DIAGNOSIS — S299XXA Unspecified injury of thorax, initial encounter: Secondary | ICD-10-CM | POA: Diagnosis not present

## 2016-09-12 DIAGNOSIS — I679 Cerebrovascular disease, unspecified: Secondary | ICD-10-CM | POA: Diagnosis not present

## 2016-09-12 DIAGNOSIS — S225XXB Flail chest, initial encounter for open fracture: Secondary | ICD-10-CM | POA: Diagnosis not present

## 2016-09-12 DIAGNOSIS — J449 Chronic obstructive pulmonary disease, unspecified: Secondary | ICD-10-CM | POA: Diagnosis not present

## 2016-09-12 DIAGNOSIS — J189 Pneumonia, unspecified organism: Secondary | ICD-10-CM | POA: Diagnosis not present

## 2016-09-13 DIAGNOSIS — I5030 Unspecified diastolic (congestive) heart failure: Secondary | ICD-10-CM | POA: Diagnosis not present

## 2016-09-13 DIAGNOSIS — J449 Chronic obstructive pulmonary disease, unspecified: Secondary | ICD-10-CM | POA: Diagnosis not present

## 2016-09-13 DIAGNOSIS — S299XXA Unspecified injury of thorax, initial encounter: Secondary | ICD-10-CM | POA: Diagnosis not present

## 2016-09-13 DIAGNOSIS — S225XXB Flail chest, initial encounter for open fracture: Secondary | ICD-10-CM | POA: Diagnosis not present

## 2016-09-13 DIAGNOSIS — I679 Cerebrovascular disease, unspecified: Secondary | ICD-10-CM | POA: Diagnosis not present

## 2016-09-13 DIAGNOSIS — J189 Pneumonia, unspecified organism: Secondary | ICD-10-CM | POA: Diagnosis not present

## 2016-09-17 DIAGNOSIS — I679 Cerebrovascular disease, unspecified: Secondary | ICD-10-CM | POA: Diagnosis not present

## 2016-09-17 DIAGNOSIS — S299XXA Unspecified injury of thorax, initial encounter: Secondary | ICD-10-CM | POA: Diagnosis not present

## 2016-09-17 DIAGNOSIS — I5030 Unspecified diastolic (congestive) heart failure: Secondary | ICD-10-CM | POA: Diagnosis not present

## 2016-09-17 DIAGNOSIS — J189 Pneumonia, unspecified organism: Secondary | ICD-10-CM | POA: Diagnosis not present

## 2016-09-17 DIAGNOSIS — S225XXB Flail chest, initial encounter for open fracture: Secondary | ICD-10-CM | POA: Diagnosis not present

## 2016-09-17 DIAGNOSIS — J449 Chronic obstructive pulmonary disease, unspecified: Secondary | ICD-10-CM | POA: Diagnosis not present

## 2016-09-26 DIAGNOSIS — S299XXA Unspecified injury of thorax, initial encounter: Secondary | ICD-10-CM | POA: Diagnosis not present

## 2016-09-26 DIAGNOSIS — J189 Pneumonia, unspecified organism: Secondary | ICD-10-CM | POA: Diagnosis not present

## 2016-09-26 DIAGNOSIS — S225XXB Flail chest, initial encounter for open fracture: Secondary | ICD-10-CM | POA: Diagnosis not present

## 2016-09-26 DIAGNOSIS — I679 Cerebrovascular disease, unspecified: Secondary | ICD-10-CM | POA: Diagnosis not present

## 2016-09-26 DIAGNOSIS — J449 Chronic obstructive pulmonary disease, unspecified: Secondary | ICD-10-CM | POA: Diagnosis not present

## 2016-09-26 DIAGNOSIS — I5030 Unspecified diastolic (congestive) heart failure: Secondary | ICD-10-CM | POA: Diagnosis not present

## 2016-09-29 IMAGING — CT CT CHEST W/O CM
2 of 4 series · 15 of 36 positions shown, 18 images · non-contrast
Comparison: 08/07/2015 chest CT.  08/11/2015 chest radiograph.

CLINICAL DATA: Fall on 08/07/2015 with hemothorax and rib
fractures. Worsening pain. Shortness of breath. Initial encounter.

EXAM:
CT CHEST WITHOUT CONTRAST
TECHNIQUE: Multidetector CT imaging of the chest was performed following the
standard protocol without IV contrast.

[Series 5: chest w/o 1mm st · axial · non-contrast · 0.59mm/px · z∈[+801,+1074]mm · 12 of 376 slices shown, 15 images]
[im 17/376  mediastinal]
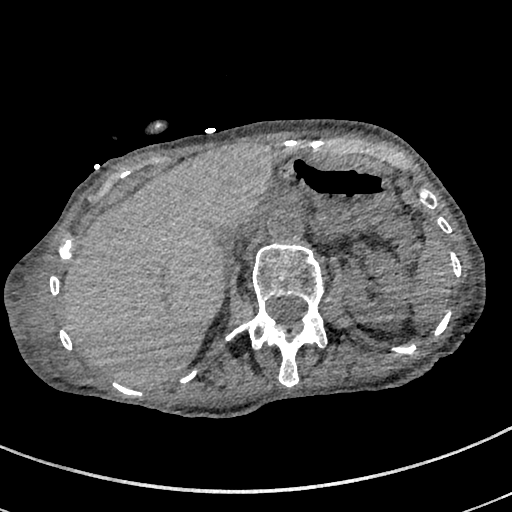
[im 17/376  lung]
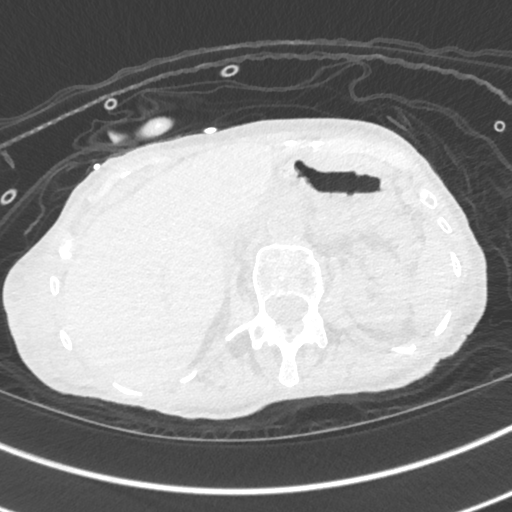
[im 49/376  lung]
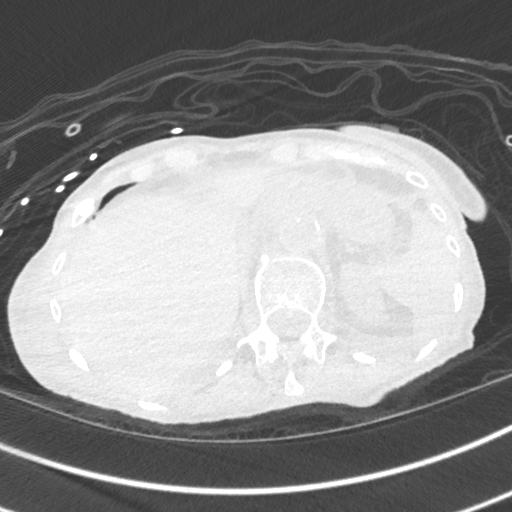
[im 82/376  lung]
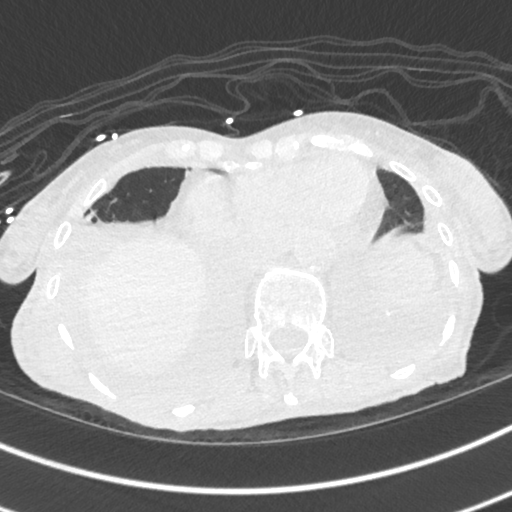
[im 115/376  lung]
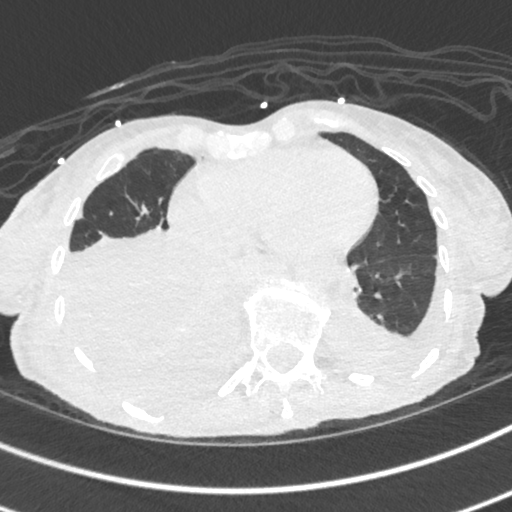
[im 147/376  mediastinal]
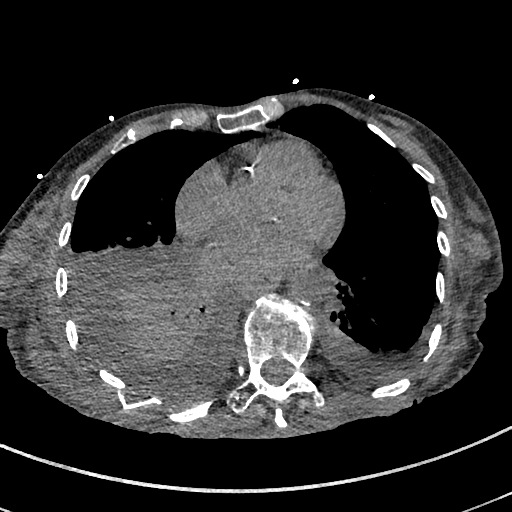
[im 147/376  lung]
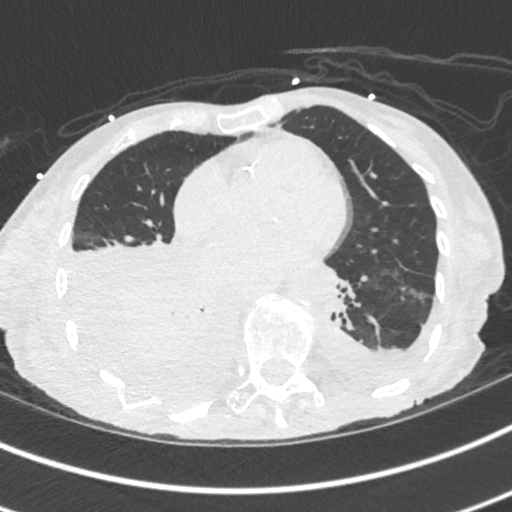
[im 180/376  lung]
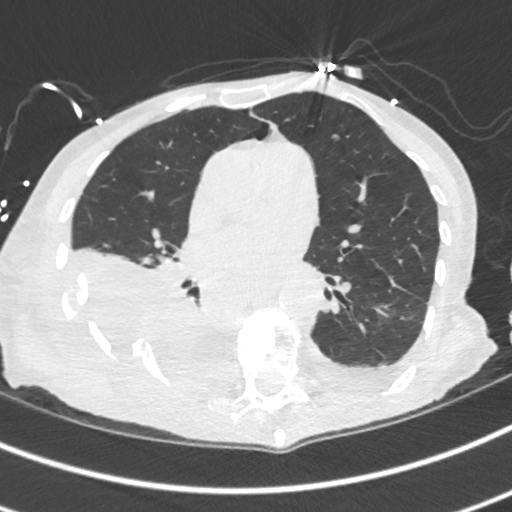
[im 196/376  lung]
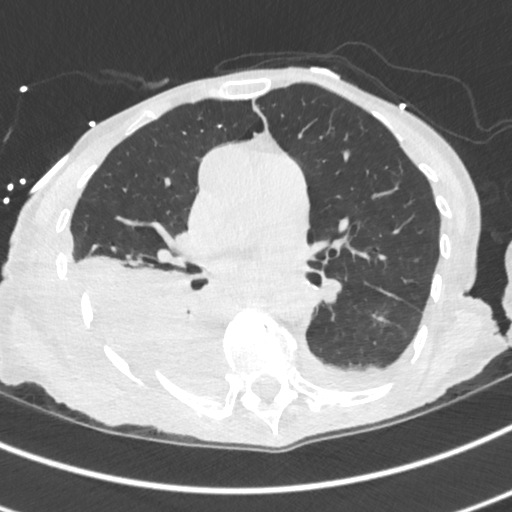
[im 229/376  lung]
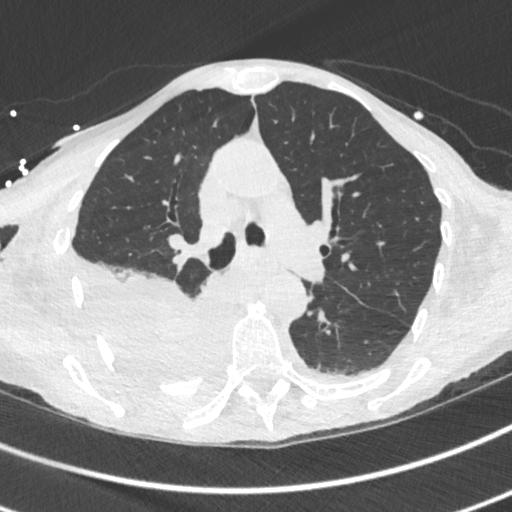
[im 261/376  mediastinal]
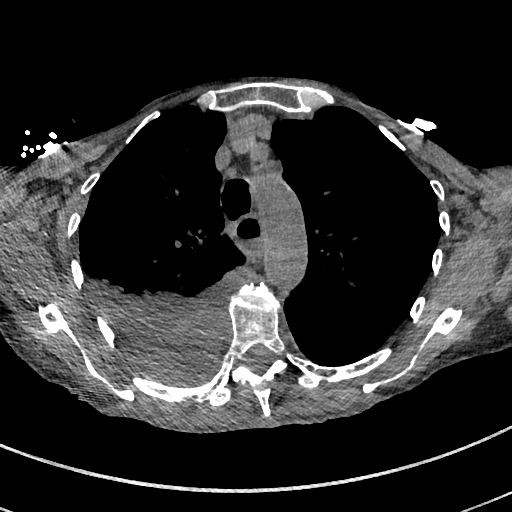
[im 261/376  lung]
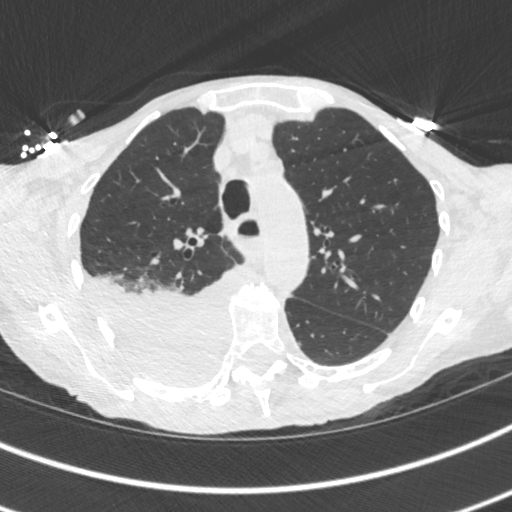
[im 294/376  lung]
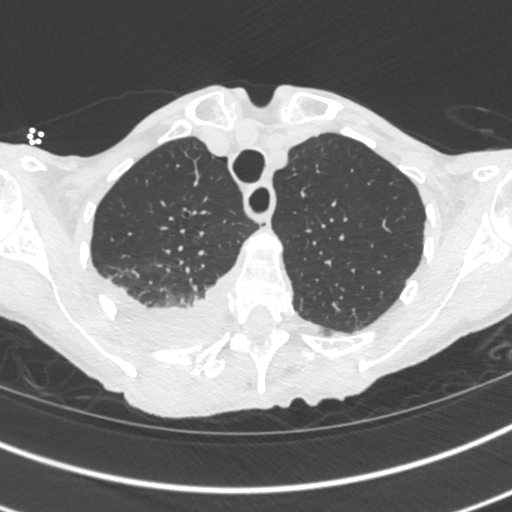
[im 327/376  lung]
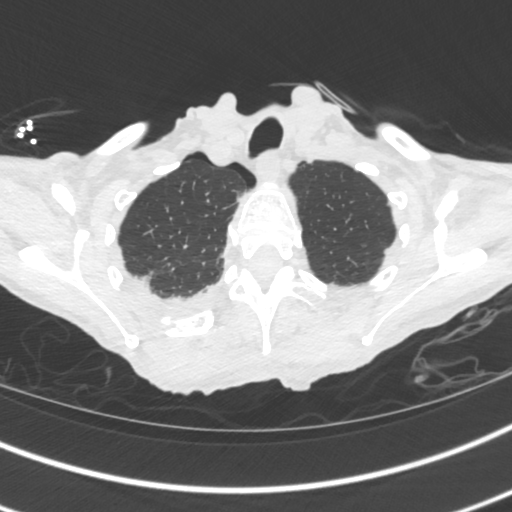
[im 359/376  lung]
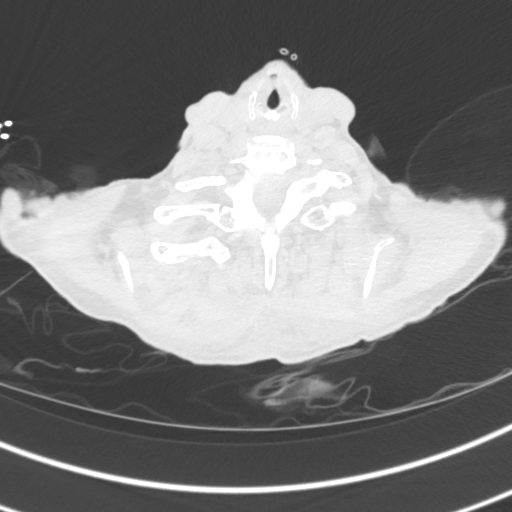

[Series 6: chest w/o 3mm st cor · coronal · non-contrast · 0.59mm/px · 3 of 101 slices shown]
[im 21/101  lung]
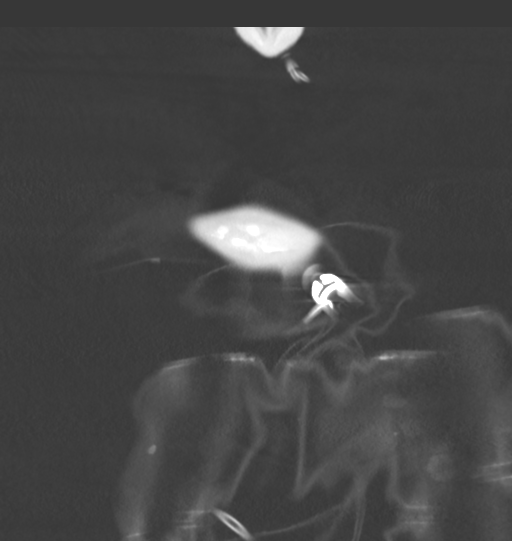
[im 41/101  lung]
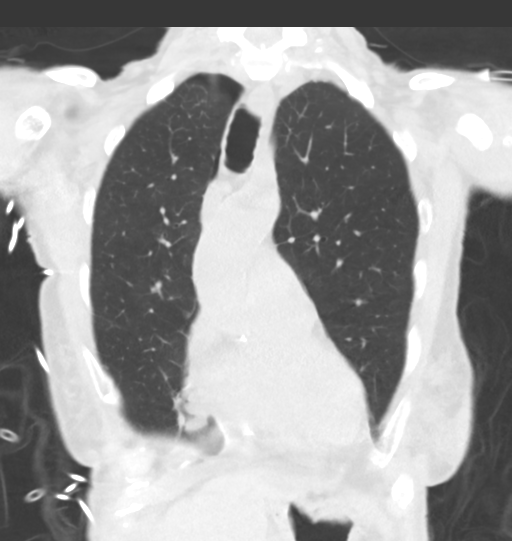
[im 61/101  lung]
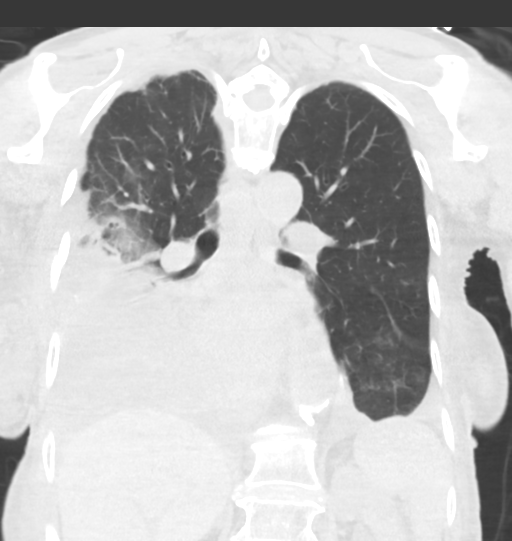

[15 of 36 positions shown; findings below may reference images not displayed]

FINDINGS: No enlarged axillary, mediastinal, or hilar lymph nodes are
identified on this unenhanced study. The heart is normal in size.
Mild aortic and coronary artery atherosclerotic calcification is
noted. There is no pericardial effusion.

Moderate right pleural effusion has mildly increased in size
compared to the prior CT. A small left pleural effusion has
decreased in size. There is a tiny right pneumothorax as seen on
recent chest radiographs. There is compressive atelectasis in the
right lung with complete right lower lobe collapse. Left lower lobe
aeration has improved from the prior CT, with persistent atelectasis
posteriorly and medially. 3 mm left upper lobe nodules are unchanged
from 11/04/2007 and considered benign (series 4, images 16 and 26).

The visualized portion of the upper abdomen is unremarkable aside
from vascular calcification. Fractures are again seen of the right
fifth through tenth ribs, with multiple fractures being comminuted
and with multiple ribs fractured in more than 1 location. There is
unchanged, prominent inward displacement of the eighth and ninth rib
fractures. Chronic T8 and T9 vertebral compression fractures are
again seen status post augmentation. Mild L1 superior endplate
compression fracture is unchanged and recent in appearance.
IMPRESSION: 1. Moderate right pleural effusion, mildly increased from the prior
CT. Associated compressive atelectasis with complete right lower
lobe collapse.
2. Small left pleural effusion, smaller than on the prior CT.
Improved aeration of the left lower lobe.
3. Tiny right pneumothorax.
4. Numerous right rib fractures and mild L1 superior plate
compression fracture as previously seen.

## 2016-10-01 DIAGNOSIS — I679 Cerebrovascular disease, unspecified: Secondary | ICD-10-CM | POA: Diagnosis not present

## 2016-10-01 DIAGNOSIS — J189 Pneumonia, unspecified organism: Secondary | ICD-10-CM | POA: Diagnosis not present

## 2016-10-01 DIAGNOSIS — I5030 Unspecified diastolic (congestive) heart failure: Secondary | ICD-10-CM | POA: Diagnosis not present

## 2016-10-01 DIAGNOSIS — J449 Chronic obstructive pulmonary disease, unspecified: Secondary | ICD-10-CM | POA: Diagnosis not present

## 2016-10-01 DIAGNOSIS — S225XXB Flail chest, initial encounter for open fracture: Secondary | ICD-10-CM | POA: Diagnosis not present

## 2016-10-01 DIAGNOSIS — S299XXA Unspecified injury of thorax, initial encounter: Secondary | ICD-10-CM | POA: Diagnosis not present

## 2016-10-03 DIAGNOSIS — S299XXA Unspecified injury of thorax, initial encounter: Secondary | ICD-10-CM | POA: Diagnosis not present

## 2016-10-03 DIAGNOSIS — I679 Cerebrovascular disease, unspecified: Secondary | ICD-10-CM | POA: Diagnosis not present

## 2016-10-03 DIAGNOSIS — I5030 Unspecified diastolic (congestive) heart failure: Secondary | ICD-10-CM | POA: Diagnosis not present

## 2016-10-03 DIAGNOSIS — J449 Chronic obstructive pulmonary disease, unspecified: Secondary | ICD-10-CM | POA: Diagnosis not present

## 2016-10-03 DIAGNOSIS — J189 Pneumonia, unspecified organism: Secondary | ICD-10-CM | POA: Diagnosis not present

## 2016-10-03 DIAGNOSIS — S225XXB Flail chest, initial encounter for open fracture: Secondary | ICD-10-CM | POA: Diagnosis not present

## 2016-10-04 DIAGNOSIS — J449 Chronic obstructive pulmonary disease, unspecified: Secondary | ICD-10-CM | POA: Diagnosis not present

## 2016-10-04 DIAGNOSIS — J189 Pneumonia, unspecified organism: Secondary | ICD-10-CM | POA: Diagnosis not present

## 2016-10-04 DIAGNOSIS — I4891 Unspecified atrial fibrillation: Secondary | ICD-10-CM | POA: Diagnosis not present

## 2016-10-04 DIAGNOSIS — D500 Iron deficiency anemia secondary to blood loss (chronic): Secondary | ICD-10-CM | POA: Diagnosis not present

## 2016-10-04 DIAGNOSIS — S299XXA Unspecified injury of thorax, initial encounter: Secondary | ICD-10-CM | POA: Diagnosis not present

## 2016-10-04 DIAGNOSIS — I10 Essential (primary) hypertension: Secondary | ICD-10-CM | POA: Diagnosis not present

## 2016-10-04 DIAGNOSIS — E46 Unspecified protein-calorie malnutrition: Secondary | ICD-10-CM | POA: Diagnosis not present

## 2016-10-04 DIAGNOSIS — I679 Cerebrovascular disease, unspecified: Secondary | ICD-10-CM | POA: Diagnosis not present

## 2016-10-04 DIAGNOSIS — I5030 Unspecified diastolic (congestive) heart failure: Secondary | ICD-10-CM | POA: Diagnosis not present

## 2016-10-04 DIAGNOSIS — F339 Major depressive disorder, recurrent, unspecified: Secondary | ICD-10-CM | POA: Diagnosis not present

## 2016-10-04 DIAGNOSIS — K219 Gastro-esophageal reflux disease without esophagitis: Secondary | ICD-10-CM | POA: Diagnosis not present

## 2016-10-04 DIAGNOSIS — S225XXB Flail chest, initial encounter for open fracture: Secondary | ICD-10-CM | POA: Diagnosis not present

## 2016-10-04 DIAGNOSIS — J9620 Acute and chronic respiratory failure, unspecified whether with hypoxia or hypercapnia: Secondary | ICD-10-CM | POA: Diagnosis not present

## 2016-10-04 DIAGNOSIS — E039 Hypothyroidism, unspecified: Secondary | ICD-10-CM | POA: Diagnosis not present

## 2016-10-10 DIAGNOSIS — J189 Pneumonia, unspecified organism: Secondary | ICD-10-CM | POA: Diagnosis not present

## 2016-10-10 DIAGNOSIS — S225XXB Flail chest, initial encounter for open fracture: Secondary | ICD-10-CM | POA: Diagnosis not present

## 2016-10-10 DIAGNOSIS — J449 Chronic obstructive pulmonary disease, unspecified: Secondary | ICD-10-CM | POA: Diagnosis not present

## 2016-10-10 DIAGNOSIS — S299XXA Unspecified injury of thorax, initial encounter: Secondary | ICD-10-CM | POA: Diagnosis not present

## 2016-10-10 DIAGNOSIS — I679 Cerebrovascular disease, unspecified: Secondary | ICD-10-CM | POA: Diagnosis not present

## 2016-10-10 DIAGNOSIS — I5030 Unspecified diastolic (congestive) heart failure: Secondary | ICD-10-CM | POA: Diagnosis not present

## 2016-10-17 DIAGNOSIS — J449 Chronic obstructive pulmonary disease, unspecified: Secondary | ICD-10-CM | POA: Diagnosis not present

## 2016-10-17 DIAGNOSIS — I679 Cerebrovascular disease, unspecified: Secondary | ICD-10-CM | POA: Diagnosis not present

## 2016-10-17 DIAGNOSIS — I5030 Unspecified diastolic (congestive) heart failure: Secondary | ICD-10-CM | POA: Diagnosis not present

## 2016-10-17 DIAGNOSIS — S225XXB Flail chest, initial encounter for open fracture: Secondary | ICD-10-CM | POA: Diagnosis not present

## 2016-10-17 DIAGNOSIS — S299XXA Unspecified injury of thorax, initial encounter: Secondary | ICD-10-CM | POA: Diagnosis not present

## 2016-10-17 DIAGNOSIS — J189 Pneumonia, unspecified organism: Secondary | ICD-10-CM | POA: Diagnosis not present

## 2016-10-22 DIAGNOSIS — I5030 Unspecified diastolic (congestive) heart failure: Secondary | ICD-10-CM | POA: Diagnosis not present

## 2016-10-22 DIAGNOSIS — J189 Pneumonia, unspecified organism: Secondary | ICD-10-CM | POA: Diagnosis not present

## 2016-10-22 DIAGNOSIS — S225XXB Flail chest, initial encounter for open fracture: Secondary | ICD-10-CM | POA: Diagnosis not present

## 2016-10-22 DIAGNOSIS — I679 Cerebrovascular disease, unspecified: Secondary | ICD-10-CM | POA: Diagnosis not present

## 2016-10-22 DIAGNOSIS — S299XXA Unspecified injury of thorax, initial encounter: Secondary | ICD-10-CM | POA: Diagnosis not present

## 2016-10-22 DIAGNOSIS — J449 Chronic obstructive pulmonary disease, unspecified: Secondary | ICD-10-CM | POA: Diagnosis not present

## 2016-10-24 DIAGNOSIS — I679 Cerebrovascular disease, unspecified: Secondary | ICD-10-CM | POA: Diagnosis not present

## 2016-10-24 DIAGNOSIS — S299XXA Unspecified injury of thorax, initial encounter: Secondary | ICD-10-CM | POA: Diagnosis not present

## 2016-10-24 DIAGNOSIS — S225XXB Flail chest, initial encounter for open fracture: Secondary | ICD-10-CM | POA: Diagnosis not present

## 2016-10-24 DIAGNOSIS — J449 Chronic obstructive pulmonary disease, unspecified: Secondary | ICD-10-CM | POA: Diagnosis not present

## 2016-10-24 DIAGNOSIS — I5030 Unspecified diastolic (congestive) heart failure: Secondary | ICD-10-CM | POA: Diagnosis not present

## 2016-10-24 DIAGNOSIS — J189 Pneumonia, unspecified organism: Secondary | ICD-10-CM | POA: Diagnosis not present

## 2016-10-31 DIAGNOSIS — S225XXB Flail chest, initial encounter for open fracture: Secondary | ICD-10-CM | POA: Diagnosis not present

## 2016-10-31 DIAGNOSIS — J189 Pneumonia, unspecified organism: Secondary | ICD-10-CM | POA: Diagnosis not present

## 2016-10-31 DIAGNOSIS — I5030 Unspecified diastolic (congestive) heart failure: Secondary | ICD-10-CM | POA: Diagnosis not present

## 2016-10-31 DIAGNOSIS — J449 Chronic obstructive pulmonary disease, unspecified: Secondary | ICD-10-CM | POA: Diagnosis not present

## 2016-10-31 DIAGNOSIS — I679 Cerebrovascular disease, unspecified: Secondary | ICD-10-CM | POA: Diagnosis not present

## 2016-10-31 DIAGNOSIS — S299XXA Unspecified injury of thorax, initial encounter: Secondary | ICD-10-CM | POA: Diagnosis not present

## 2016-11-01 DIAGNOSIS — F339 Major depressive disorder, recurrent, unspecified: Secondary | ICD-10-CM | POA: Diagnosis not present

## 2016-11-01 DIAGNOSIS — I5030 Unspecified diastolic (congestive) heart failure: Secondary | ICD-10-CM | POA: Diagnosis not present

## 2016-11-01 DIAGNOSIS — K219 Gastro-esophageal reflux disease without esophagitis: Secondary | ICD-10-CM | POA: Diagnosis not present

## 2016-11-01 DIAGNOSIS — I4891 Unspecified atrial fibrillation: Secondary | ICD-10-CM | POA: Diagnosis not present

## 2016-11-01 DIAGNOSIS — S299XXA Unspecified injury of thorax, initial encounter: Secondary | ICD-10-CM | POA: Diagnosis not present

## 2016-11-01 DIAGNOSIS — S225XXB Flail chest, initial encounter for open fracture: Secondary | ICD-10-CM | POA: Diagnosis not present

## 2016-11-01 DIAGNOSIS — J449 Chronic obstructive pulmonary disease, unspecified: Secondary | ICD-10-CM | POA: Diagnosis not present

## 2016-11-01 DIAGNOSIS — J9620 Acute and chronic respiratory failure, unspecified whether with hypoxia or hypercapnia: Secondary | ICD-10-CM | POA: Diagnosis not present

## 2016-11-01 DIAGNOSIS — E039 Hypothyroidism, unspecified: Secondary | ICD-10-CM | POA: Diagnosis not present

## 2016-11-01 DIAGNOSIS — I10 Essential (primary) hypertension: Secondary | ICD-10-CM | POA: Diagnosis not present

## 2016-11-01 DIAGNOSIS — I679 Cerebrovascular disease, unspecified: Secondary | ICD-10-CM | POA: Diagnosis not present

## 2016-11-01 DIAGNOSIS — D500 Iron deficiency anemia secondary to blood loss (chronic): Secondary | ICD-10-CM | POA: Diagnosis not present

## 2016-11-01 DIAGNOSIS — J189 Pneumonia, unspecified organism: Secondary | ICD-10-CM | POA: Diagnosis not present

## 2016-11-01 DIAGNOSIS — E46 Unspecified protein-calorie malnutrition: Secondary | ICD-10-CM | POA: Diagnosis not present

## 2016-11-07 DIAGNOSIS — I5030 Unspecified diastolic (congestive) heart failure: Secondary | ICD-10-CM | POA: Diagnosis not present

## 2016-11-07 DIAGNOSIS — J189 Pneumonia, unspecified organism: Secondary | ICD-10-CM | POA: Diagnosis not present

## 2016-11-07 DIAGNOSIS — S225XXB Flail chest, initial encounter for open fracture: Secondary | ICD-10-CM | POA: Diagnosis not present

## 2016-11-07 DIAGNOSIS — S299XXA Unspecified injury of thorax, initial encounter: Secondary | ICD-10-CM | POA: Diagnosis not present

## 2016-11-07 DIAGNOSIS — I679 Cerebrovascular disease, unspecified: Secondary | ICD-10-CM | POA: Diagnosis not present

## 2016-11-07 DIAGNOSIS — J449 Chronic obstructive pulmonary disease, unspecified: Secondary | ICD-10-CM | POA: Diagnosis not present

## 2016-11-08 DIAGNOSIS — J449 Chronic obstructive pulmonary disease, unspecified: Secondary | ICD-10-CM | POA: Diagnosis not present

## 2016-11-08 DIAGNOSIS — I679 Cerebrovascular disease, unspecified: Secondary | ICD-10-CM | POA: Diagnosis not present

## 2016-11-08 DIAGNOSIS — I5030 Unspecified diastolic (congestive) heart failure: Secondary | ICD-10-CM | POA: Diagnosis not present

## 2016-11-08 DIAGNOSIS — J189 Pneumonia, unspecified organism: Secondary | ICD-10-CM | POA: Diagnosis not present

## 2016-11-08 DIAGNOSIS — S299XXA Unspecified injury of thorax, initial encounter: Secondary | ICD-10-CM | POA: Diagnosis not present

## 2016-11-08 DIAGNOSIS — S225XXB Flail chest, initial encounter for open fracture: Secondary | ICD-10-CM | POA: Diagnosis not present

## 2016-11-14 DIAGNOSIS — S299XXA Unspecified injury of thorax, initial encounter: Secondary | ICD-10-CM | POA: Diagnosis not present

## 2016-11-14 DIAGNOSIS — J189 Pneumonia, unspecified organism: Secondary | ICD-10-CM | POA: Diagnosis not present

## 2016-11-14 DIAGNOSIS — I679 Cerebrovascular disease, unspecified: Secondary | ICD-10-CM | POA: Diagnosis not present

## 2016-11-14 DIAGNOSIS — I5030 Unspecified diastolic (congestive) heart failure: Secondary | ICD-10-CM | POA: Diagnosis not present

## 2016-11-14 DIAGNOSIS — J449 Chronic obstructive pulmonary disease, unspecified: Secondary | ICD-10-CM | POA: Diagnosis not present

## 2016-11-14 DIAGNOSIS — S225XXB Flail chest, initial encounter for open fracture: Secondary | ICD-10-CM | POA: Diagnosis not present

## 2016-11-15 DIAGNOSIS — S299XXA Unspecified injury of thorax, initial encounter: Secondary | ICD-10-CM | POA: Diagnosis not present

## 2016-11-15 DIAGNOSIS — J189 Pneumonia, unspecified organism: Secondary | ICD-10-CM | POA: Diagnosis not present

## 2016-11-15 DIAGNOSIS — I679 Cerebrovascular disease, unspecified: Secondary | ICD-10-CM | POA: Diagnosis not present

## 2016-11-15 DIAGNOSIS — I5030 Unspecified diastolic (congestive) heart failure: Secondary | ICD-10-CM | POA: Diagnosis not present

## 2016-11-15 DIAGNOSIS — J449 Chronic obstructive pulmonary disease, unspecified: Secondary | ICD-10-CM | POA: Diagnosis not present

## 2016-11-15 DIAGNOSIS — S225XXB Flail chest, initial encounter for open fracture: Secondary | ICD-10-CM | POA: Diagnosis not present

## 2016-11-16 DIAGNOSIS — S299XXA Unspecified injury of thorax, initial encounter: Secondary | ICD-10-CM | POA: Diagnosis not present

## 2016-11-16 DIAGNOSIS — S225XXB Flail chest, initial encounter for open fracture: Secondary | ICD-10-CM | POA: Diagnosis not present

## 2016-11-16 DIAGNOSIS — I679 Cerebrovascular disease, unspecified: Secondary | ICD-10-CM | POA: Diagnosis not present

## 2016-11-16 DIAGNOSIS — I5030 Unspecified diastolic (congestive) heart failure: Secondary | ICD-10-CM | POA: Diagnosis not present

## 2016-11-16 DIAGNOSIS — J449 Chronic obstructive pulmonary disease, unspecified: Secondary | ICD-10-CM | POA: Diagnosis not present

## 2016-11-16 DIAGNOSIS — J189 Pneumonia, unspecified organism: Secondary | ICD-10-CM | POA: Diagnosis not present

## 2016-11-21 DIAGNOSIS — J449 Chronic obstructive pulmonary disease, unspecified: Secondary | ICD-10-CM | POA: Diagnosis not present

## 2016-11-21 DIAGNOSIS — S299XXA Unspecified injury of thorax, initial encounter: Secondary | ICD-10-CM | POA: Diagnosis not present

## 2016-11-21 DIAGNOSIS — J189 Pneumonia, unspecified organism: Secondary | ICD-10-CM | POA: Diagnosis not present

## 2016-11-21 DIAGNOSIS — S225XXB Flail chest, initial encounter for open fracture: Secondary | ICD-10-CM | POA: Diagnosis not present

## 2016-11-21 DIAGNOSIS — I5030 Unspecified diastolic (congestive) heart failure: Secondary | ICD-10-CM | POA: Diagnosis not present

## 2016-11-21 DIAGNOSIS — I679 Cerebrovascular disease, unspecified: Secondary | ICD-10-CM | POA: Diagnosis not present

## 2016-11-28 DIAGNOSIS — I5030 Unspecified diastolic (congestive) heart failure: Secondary | ICD-10-CM | POA: Diagnosis not present

## 2016-11-28 DIAGNOSIS — J449 Chronic obstructive pulmonary disease, unspecified: Secondary | ICD-10-CM | POA: Diagnosis not present

## 2016-11-28 DIAGNOSIS — J189 Pneumonia, unspecified organism: Secondary | ICD-10-CM | POA: Diagnosis not present

## 2016-11-28 DIAGNOSIS — S299XXA Unspecified injury of thorax, initial encounter: Secondary | ICD-10-CM | POA: Diagnosis not present

## 2016-11-28 DIAGNOSIS — I679 Cerebrovascular disease, unspecified: Secondary | ICD-10-CM | POA: Diagnosis not present

## 2016-11-28 DIAGNOSIS — S225XXB Flail chest, initial encounter for open fracture: Secondary | ICD-10-CM | POA: Diagnosis not present

## 2016-12-02 DIAGNOSIS — J189 Pneumonia, unspecified organism: Secondary | ICD-10-CM | POA: Diagnosis not present

## 2016-12-02 DIAGNOSIS — S225XXB Flail chest, initial encounter for open fracture: Secondary | ICD-10-CM | POA: Diagnosis not present

## 2016-12-02 DIAGNOSIS — I5030 Unspecified diastolic (congestive) heart failure: Secondary | ICD-10-CM | POA: Diagnosis not present

## 2016-12-02 DIAGNOSIS — J9620 Acute and chronic respiratory failure, unspecified whether with hypoxia or hypercapnia: Secondary | ICD-10-CM | POA: Diagnosis not present

## 2016-12-02 DIAGNOSIS — J449 Chronic obstructive pulmonary disease, unspecified: Secondary | ICD-10-CM | POA: Diagnosis not present

## 2016-12-02 DIAGNOSIS — K219 Gastro-esophageal reflux disease without esophagitis: Secondary | ICD-10-CM | POA: Diagnosis not present

## 2016-12-02 DIAGNOSIS — S299XXA Unspecified injury of thorax, initial encounter: Secondary | ICD-10-CM | POA: Diagnosis not present

## 2016-12-02 DIAGNOSIS — E46 Unspecified protein-calorie malnutrition: Secondary | ICD-10-CM | POA: Diagnosis not present

## 2016-12-02 DIAGNOSIS — D500 Iron deficiency anemia secondary to blood loss (chronic): Secondary | ICD-10-CM | POA: Diagnosis not present

## 2016-12-02 DIAGNOSIS — F339 Major depressive disorder, recurrent, unspecified: Secondary | ICD-10-CM | POA: Diagnosis not present

## 2016-12-02 DIAGNOSIS — I10 Essential (primary) hypertension: Secondary | ICD-10-CM | POA: Diagnosis not present

## 2016-12-02 DIAGNOSIS — I679 Cerebrovascular disease, unspecified: Secondary | ICD-10-CM | POA: Diagnosis not present

## 2016-12-02 DIAGNOSIS — I4891 Unspecified atrial fibrillation: Secondary | ICD-10-CM | POA: Diagnosis not present

## 2016-12-02 DIAGNOSIS — E039 Hypothyroidism, unspecified: Secondary | ICD-10-CM | POA: Diagnosis not present

## 2016-12-04 DIAGNOSIS — J449 Chronic obstructive pulmonary disease, unspecified: Secondary | ICD-10-CM | POA: Diagnosis not present

## 2016-12-04 DIAGNOSIS — S225XXB Flail chest, initial encounter for open fracture: Secondary | ICD-10-CM | POA: Diagnosis not present

## 2016-12-04 DIAGNOSIS — S299XXA Unspecified injury of thorax, initial encounter: Secondary | ICD-10-CM | POA: Diagnosis not present

## 2016-12-04 DIAGNOSIS — J189 Pneumonia, unspecified organism: Secondary | ICD-10-CM | POA: Diagnosis not present

## 2016-12-04 DIAGNOSIS — I679 Cerebrovascular disease, unspecified: Secondary | ICD-10-CM | POA: Diagnosis not present

## 2016-12-04 DIAGNOSIS — I5030 Unspecified diastolic (congestive) heart failure: Secondary | ICD-10-CM | POA: Diagnosis not present

## 2016-12-05 DIAGNOSIS — I679 Cerebrovascular disease, unspecified: Secondary | ICD-10-CM | POA: Diagnosis not present

## 2016-12-05 DIAGNOSIS — J449 Chronic obstructive pulmonary disease, unspecified: Secondary | ICD-10-CM | POA: Diagnosis not present

## 2016-12-05 DIAGNOSIS — S225XXB Flail chest, initial encounter for open fracture: Secondary | ICD-10-CM | POA: Diagnosis not present

## 2016-12-05 DIAGNOSIS — I5030 Unspecified diastolic (congestive) heart failure: Secondary | ICD-10-CM | POA: Diagnosis not present

## 2016-12-05 DIAGNOSIS — S299XXA Unspecified injury of thorax, initial encounter: Secondary | ICD-10-CM | POA: Diagnosis not present

## 2016-12-05 DIAGNOSIS — J189 Pneumonia, unspecified organism: Secondary | ICD-10-CM | POA: Diagnosis not present

## 2016-12-12 DIAGNOSIS — J189 Pneumonia, unspecified organism: Secondary | ICD-10-CM | POA: Diagnosis not present

## 2016-12-12 DIAGNOSIS — I5030 Unspecified diastolic (congestive) heart failure: Secondary | ICD-10-CM | POA: Diagnosis not present

## 2016-12-12 DIAGNOSIS — J449 Chronic obstructive pulmonary disease, unspecified: Secondary | ICD-10-CM | POA: Diagnosis not present

## 2016-12-12 DIAGNOSIS — I679 Cerebrovascular disease, unspecified: Secondary | ICD-10-CM | POA: Diagnosis not present

## 2016-12-12 DIAGNOSIS — S299XXA Unspecified injury of thorax, initial encounter: Secondary | ICD-10-CM | POA: Diagnosis not present

## 2016-12-12 DIAGNOSIS — S225XXB Flail chest, initial encounter for open fracture: Secondary | ICD-10-CM | POA: Diagnosis not present

## 2016-12-13 DIAGNOSIS — I679 Cerebrovascular disease, unspecified: Secondary | ICD-10-CM | POA: Diagnosis not present

## 2016-12-13 DIAGNOSIS — S225XXB Flail chest, initial encounter for open fracture: Secondary | ICD-10-CM | POA: Diagnosis not present

## 2016-12-13 DIAGNOSIS — I5030 Unspecified diastolic (congestive) heart failure: Secondary | ICD-10-CM | POA: Diagnosis not present

## 2016-12-13 DIAGNOSIS — J189 Pneumonia, unspecified organism: Secondary | ICD-10-CM | POA: Diagnosis not present

## 2016-12-13 DIAGNOSIS — J449 Chronic obstructive pulmonary disease, unspecified: Secondary | ICD-10-CM | POA: Diagnosis not present

## 2016-12-13 DIAGNOSIS — S299XXA Unspecified injury of thorax, initial encounter: Secondary | ICD-10-CM | POA: Diagnosis not present

## 2016-12-18 DIAGNOSIS — S225XXB Flail chest, initial encounter for open fracture: Secondary | ICD-10-CM | POA: Diagnosis not present

## 2016-12-18 DIAGNOSIS — I679 Cerebrovascular disease, unspecified: Secondary | ICD-10-CM | POA: Diagnosis not present

## 2016-12-18 DIAGNOSIS — S299XXA Unspecified injury of thorax, initial encounter: Secondary | ICD-10-CM | POA: Diagnosis not present

## 2016-12-18 DIAGNOSIS — J189 Pneumonia, unspecified organism: Secondary | ICD-10-CM | POA: Diagnosis not present

## 2016-12-18 DIAGNOSIS — I5030 Unspecified diastolic (congestive) heart failure: Secondary | ICD-10-CM | POA: Diagnosis not present

## 2016-12-18 DIAGNOSIS — J449 Chronic obstructive pulmonary disease, unspecified: Secondary | ICD-10-CM | POA: Diagnosis not present

## 2016-12-19 DIAGNOSIS — J449 Chronic obstructive pulmonary disease, unspecified: Secondary | ICD-10-CM | POA: Diagnosis not present

## 2016-12-19 DIAGNOSIS — I679 Cerebrovascular disease, unspecified: Secondary | ICD-10-CM | POA: Diagnosis not present

## 2016-12-19 DIAGNOSIS — I5030 Unspecified diastolic (congestive) heart failure: Secondary | ICD-10-CM | POA: Diagnosis not present

## 2016-12-19 DIAGNOSIS — J189 Pneumonia, unspecified organism: Secondary | ICD-10-CM | POA: Diagnosis not present

## 2016-12-19 DIAGNOSIS — S225XXB Flail chest, initial encounter for open fracture: Secondary | ICD-10-CM | POA: Diagnosis not present

## 2016-12-19 DIAGNOSIS — S299XXA Unspecified injury of thorax, initial encounter: Secondary | ICD-10-CM | POA: Diagnosis not present

## 2016-12-25 DIAGNOSIS — S299XXA Unspecified injury of thorax, initial encounter: Secondary | ICD-10-CM | POA: Diagnosis not present

## 2016-12-25 DIAGNOSIS — J449 Chronic obstructive pulmonary disease, unspecified: Secondary | ICD-10-CM | POA: Diagnosis not present

## 2016-12-25 DIAGNOSIS — I679 Cerebrovascular disease, unspecified: Secondary | ICD-10-CM | POA: Diagnosis not present

## 2016-12-25 DIAGNOSIS — I5030 Unspecified diastolic (congestive) heart failure: Secondary | ICD-10-CM | POA: Diagnosis not present

## 2016-12-25 DIAGNOSIS — J189 Pneumonia, unspecified organism: Secondary | ICD-10-CM | POA: Diagnosis not present

## 2016-12-25 DIAGNOSIS — S225XXB Flail chest, initial encounter for open fracture: Secondary | ICD-10-CM | POA: Diagnosis not present

## 2016-12-26 DIAGNOSIS — I5030 Unspecified diastolic (congestive) heart failure: Secondary | ICD-10-CM | POA: Diagnosis not present

## 2016-12-26 DIAGNOSIS — S299XXA Unspecified injury of thorax, initial encounter: Secondary | ICD-10-CM | POA: Diagnosis not present

## 2016-12-26 DIAGNOSIS — J189 Pneumonia, unspecified organism: Secondary | ICD-10-CM | POA: Diagnosis not present

## 2016-12-26 DIAGNOSIS — S225XXB Flail chest, initial encounter for open fracture: Secondary | ICD-10-CM | POA: Diagnosis not present

## 2016-12-26 DIAGNOSIS — I679 Cerebrovascular disease, unspecified: Secondary | ICD-10-CM | POA: Diagnosis not present

## 2016-12-26 DIAGNOSIS — J449 Chronic obstructive pulmonary disease, unspecified: Secondary | ICD-10-CM | POA: Diagnosis not present

## 2017-01-01 DIAGNOSIS — D500 Iron deficiency anemia secondary to blood loss (chronic): Secondary | ICD-10-CM | POA: Diagnosis not present

## 2017-01-01 DIAGNOSIS — F339 Major depressive disorder, recurrent, unspecified: Secondary | ICD-10-CM | POA: Diagnosis not present

## 2017-01-01 DIAGNOSIS — S299XXA Unspecified injury of thorax, initial encounter: Secondary | ICD-10-CM | POA: Diagnosis not present

## 2017-01-01 DIAGNOSIS — I10 Essential (primary) hypertension: Secondary | ICD-10-CM | POA: Diagnosis not present

## 2017-01-01 DIAGNOSIS — I679 Cerebrovascular disease, unspecified: Secondary | ICD-10-CM | POA: Diagnosis not present

## 2017-01-01 DIAGNOSIS — E46 Unspecified protein-calorie malnutrition: Secondary | ICD-10-CM | POA: Diagnosis not present

## 2017-01-01 DIAGNOSIS — I5030 Unspecified diastolic (congestive) heart failure: Secondary | ICD-10-CM | POA: Diagnosis not present

## 2017-01-01 DIAGNOSIS — E039 Hypothyroidism, unspecified: Secondary | ICD-10-CM | POA: Diagnosis not present

## 2017-01-01 DIAGNOSIS — J9620 Acute and chronic respiratory failure, unspecified whether with hypoxia or hypercapnia: Secondary | ICD-10-CM | POA: Diagnosis not present

## 2017-01-01 DIAGNOSIS — J449 Chronic obstructive pulmonary disease, unspecified: Secondary | ICD-10-CM | POA: Diagnosis not present

## 2017-01-01 DIAGNOSIS — K219 Gastro-esophageal reflux disease without esophagitis: Secondary | ICD-10-CM | POA: Diagnosis not present

## 2017-01-01 DIAGNOSIS — J189 Pneumonia, unspecified organism: Secondary | ICD-10-CM | POA: Diagnosis not present

## 2017-01-01 DIAGNOSIS — I4891 Unspecified atrial fibrillation: Secondary | ICD-10-CM | POA: Diagnosis not present

## 2017-01-01 DIAGNOSIS — S225XXB Flail chest, initial encounter for open fracture: Secondary | ICD-10-CM | POA: Diagnosis not present

## 2017-01-02 DIAGNOSIS — S225XXB Flail chest, initial encounter for open fracture: Secondary | ICD-10-CM | POA: Diagnosis not present

## 2017-01-02 DIAGNOSIS — S299XXA Unspecified injury of thorax, initial encounter: Secondary | ICD-10-CM | POA: Diagnosis not present

## 2017-01-02 DIAGNOSIS — J449 Chronic obstructive pulmonary disease, unspecified: Secondary | ICD-10-CM | POA: Diagnosis not present

## 2017-01-02 DIAGNOSIS — J189 Pneumonia, unspecified organism: Secondary | ICD-10-CM | POA: Diagnosis not present

## 2017-01-02 DIAGNOSIS — I679 Cerebrovascular disease, unspecified: Secondary | ICD-10-CM | POA: Diagnosis not present

## 2017-01-02 DIAGNOSIS — I5030 Unspecified diastolic (congestive) heart failure: Secondary | ICD-10-CM | POA: Diagnosis not present

## 2017-01-09 DIAGNOSIS — S299XXA Unspecified injury of thorax, initial encounter: Secondary | ICD-10-CM | POA: Diagnosis not present

## 2017-01-09 DIAGNOSIS — S225XXB Flail chest, initial encounter for open fracture: Secondary | ICD-10-CM | POA: Diagnosis not present

## 2017-01-09 DIAGNOSIS — J189 Pneumonia, unspecified organism: Secondary | ICD-10-CM | POA: Diagnosis not present

## 2017-01-09 DIAGNOSIS — J449 Chronic obstructive pulmonary disease, unspecified: Secondary | ICD-10-CM | POA: Diagnosis not present

## 2017-01-09 DIAGNOSIS — I679 Cerebrovascular disease, unspecified: Secondary | ICD-10-CM | POA: Diagnosis not present

## 2017-01-09 DIAGNOSIS — I5030 Unspecified diastolic (congestive) heart failure: Secondary | ICD-10-CM | POA: Diagnosis not present

## 2017-01-16 DIAGNOSIS — I5030 Unspecified diastolic (congestive) heart failure: Secondary | ICD-10-CM | POA: Diagnosis not present

## 2017-01-16 DIAGNOSIS — S225XXB Flail chest, initial encounter for open fracture: Secondary | ICD-10-CM | POA: Diagnosis not present

## 2017-01-16 DIAGNOSIS — J189 Pneumonia, unspecified organism: Secondary | ICD-10-CM | POA: Diagnosis not present

## 2017-01-16 DIAGNOSIS — S299XXA Unspecified injury of thorax, initial encounter: Secondary | ICD-10-CM | POA: Diagnosis not present

## 2017-01-16 DIAGNOSIS — I679 Cerebrovascular disease, unspecified: Secondary | ICD-10-CM | POA: Diagnosis not present

## 2017-01-16 DIAGNOSIS — J449 Chronic obstructive pulmonary disease, unspecified: Secondary | ICD-10-CM | POA: Diagnosis not present

## 2017-01-20 DIAGNOSIS — S225XXB Flail chest, initial encounter for open fracture: Secondary | ICD-10-CM | POA: Diagnosis not present

## 2017-01-20 DIAGNOSIS — I5030 Unspecified diastolic (congestive) heart failure: Secondary | ICD-10-CM | POA: Diagnosis not present

## 2017-01-20 DIAGNOSIS — J189 Pneumonia, unspecified organism: Secondary | ICD-10-CM | POA: Diagnosis not present

## 2017-01-20 DIAGNOSIS — S299XXA Unspecified injury of thorax, initial encounter: Secondary | ICD-10-CM | POA: Diagnosis not present

## 2017-01-20 DIAGNOSIS — I679 Cerebrovascular disease, unspecified: Secondary | ICD-10-CM | POA: Diagnosis not present

## 2017-01-20 DIAGNOSIS — J449 Chronic obstructive pulmonary disease, unspecified: Secondary | ICD-10-CM | POA: Diagnosis not present

## 2017-01-23 DIAGNOSIS — S225XXB Flail chest, initial encounter for open fracture: Secondary | ICD-10-CM | POA: Diagnosis not present

## 2017-01-23 DIAGNOSIS — I5030 Unspecified diastolic (congestive) heart failure: Secondary | ICD-10-CM | POA: Diagnosis not present

## 2017-01-23 DIAGNOSIS — I679 Cerebrovascular disease, unspecified: Secondary | ICD-10-CM | POA: Diagnosis not present

## 2017-01-23 DIAGNOSIS — J189 Pneumonia, unspecified organism: Secondary | ICD-10-CM | POA: Diagnosis not present

## 2017-01-23 DIAGNOSIS — J449 Chronic obstructive pulmonary disease, unspecified: Secondary | ICD-10-CM | POA: Diagnosis not present

## 2017-01-23 DIAGNOSIS — S299XXA Unspecified injury of thorax, initial encounter: Secondary | ICD-10-CM | POA: Diagnosis not present

## 2017-01-24 DIAGNOSIS — S299XXA Unspecified injury of thorax, initial encounter: Secondary | ICD-10-CM | POA: Diagnosis not present

## 2017-01-24 DIAGNOSIS — J449 Chronic obstructive pulmonary disease, unspecified: Secondary | ICD-10-CM | POA: Diagnosis not present

## 2017-01-24 DIAGNOSIS — I5030 Unspecified diastolic (congestive) heart failure: Secondary | ICD-10-CM | POA: Diagnosis not present

## 2017-01-24 DIAGNOSIS — S225XXB Flail chest, initial encounter for open fracture: Secondary | ICD-10-CM | POA: Diagnosis not present

## 2017-01-24 DIAGNOSIS — I679 Cerebrovascular disease, unspecified: Secondary | ICD-10-CM | POA: Diagnosis not present

## 2017-01-24 DIAGNOSIS — J189 Pneumonia, unspecified organism: Secondary | ICD-10-CM | POA: Diagnosis not present

## 2017-01-25 DIAGNOSIS — S299XXA Unspecified injury of thorax, initial encounter: Secondary | ICD-10-CM | POA: Diagnosis not present

## 2017-01-25 DIAGNOSIS — J449 Chronic obstructive pulmonary disease, unspecified: Secondary | ICD-10-CM | POA: Diagnosis not present

## 2017-01-25 DIAGNOSIS — S225XXB Flail chest, initial encounter for open fracture: Secondary | ICD-10-CM | POA: Diagnosis not present

## 2017-01-25 DIAGNOSIS — J189 Pneumonia, unspecified organism: Secondary | ICD-10-CM | POA: Diagnosis not present

## 2017-01-25 DIAGNOSIS — I5030 Unspecified diastolic (congestive) heart failure: Secondary | ICD-10-CM | POA: Diagnosis not present

## 2017-01-25 DIAGNOSIS — I679 Cerebrovascular disease, unspecified: Secondary | ICD-10-CM | POA: Diagnosis not present

## 2017-01-27 DIAGNOSIS — I5030 Unspecified diastolic (congestive) heart failure: Secondary | ICD-10-CM | POA: Diagnosis not present

## 2017-01-27 DIAGNOSIS — J189 Pneumonia, unspecified organism: Secondary | ICD-10-CM | POA: Diagnosis not present

## 2017-01-27 DIAGNOSIS — S225XXB Flail chest, initial encounter for open fracture: Secondary | ICD-10-CM | POA: Diagnosis not present

## 2017-01-27 DIAGNOSIS — I679 Cerebrovascular disease, unspecified: Secondary | ICD-10-CM | POA: Diagnosis not present

## 2017-01-27 DIAGNOSIS — S299XXA Unspecified injury of thorax, initial encounter: Secondary | ICD-10-CM | POA: Diagnosis not present

## 2017-01-27 DIAGNOSIS — J449 Chronic obstructive pulmonary disease, unspecified: Secondary | ICD-10-CM | POA: Diagnosis not present

## 2017-01-28 DIAGNOSIS — J449 Chronic obstructive pulmonary disease, unspecified: Secondary | ICD-10-CM | POA: Diagnosis not present

## 2017-01-28 DIAGNOSIS — S299XXA Unspecified injury of thorax, initial encounter: Secondary | ICD-10-CM | POA: Diagnosis not present

## 2017-01-28 DIAGNOSIS — I679 Cerebrovascular disease, unspecified: Secondary | ICD-10-CM | POA: Diagnosis not present

## 2017-01-28 DIAGNOSIS — I5030 Unspecified diastolic (congestive) heart failure: Secondary | ICD-10-CM | POA: Diagnosis not present

## 2017-01-28 DIAGNOSIS — S225XXB Flail chest, initial encounter for open fracture: Secondary | ICD-10-CM | POA: Diagnosis not present

## 2017-01-28 DIAGNOSIS — J189 Pneumonia, unspecified organism: Secondary | ICD-10-CM | POA: Diagnosis not present

## 2017-01-29 DIAGNOSIS — I679 Cerebrovascular disease, unspecified: Secondary | ICD-10-CM | POA: Diagnosis not present

## 2017-01-29 DIAGNOSIS — I5030 Unspecified diastolic (congestive) heart failure: Secondary | ICD-10-CM | POA: Diagnosis not present

## 2017-01-29 DIAGNOSIS — S225XXB Flail chest, initial encounter for open fracture: Secondary | ICD-10-CM | POA: Diagnosis not present

## 2017-01-29 DIAGNOSIS — S299XXA Unspecified injury of thorax, initial encounter: Secondary | ICD-10-CM | POA: Diagnosis not present

## 2017-01-29 DIAGNOSIS — J449 Chronic obstructive pulmonary disease, unspecified: Secondary | ICD-10-CM | POA: Diagnosis not present

## 2017-01-29 DIAGNOSIS — J189 Pneumonia, unspecified organism: Secondary | ICD-10-CM | POA: Diagnosis not present

## 2017-01-30 DIAGNOSIS — S299XXA Unspecified injury of thorax, initial encounter: Secondary | ICD-10-CM | POA: Diagnosis not present

## 2017-01-30 DIAGNOSIS — J189 Pneumonia, unspecified organism: Secondary | ICD-10-CM | POA: Diagnosis not present

## 2017-01-30 DIAGNOSIS — S225XXB Flail chest, initial encounter for open fracture: Secondary | ICD-10-CM | POA: Diagnosis not present

## 2017-01-30 DIAGNOSIS — I5030 Unspecified diastolic (congestive) heart failure: Secondary | ICD-10-CM | POA: Diagnosis not present

## 2017-01-30 DIAGNOSIS — I679 Cerebrovascular disease, unspecified: Secondary | ICD-10-CM | POA: Diagnosis not present

## 2017-01-30 DIAGNOSIS — J449 Chronic obstructive pulmonary disease, unspecified: Secondary | ICD-10-CM | POA: Diagnosis not present

## 2017-02-01 DIAGNOSIS — J9620 Acute and chronic respiratory failure, unspecified whether with hypoxia or hypercapnia: Secondary | ICD-10-CM | POA: Diagnosis not present

## 2017-02-01 DIAGNOSIS — I5030 Unspecified diastolic (congestive) heart failure: Secondary | ICD-10-CM | POA: Diagnosis not present

## 2017-02-01 DIAGNOSIS — J189 Pneumonia, unspecified organism: Secondary | ICD-10-CM | POA: Diagnosis not present

## 2017-02-01 DIAGNOSIS — I4891 Unspecified atrial fibrillation: Secondary | ICD-10-CM | POA: Diagnosis not present

## 2017-02-01 DIAGNOSIS — I10 Essential (primary) hypertension: Secondary | ICD-10-CM | POA: Diagnosis not present

## 2017-02-01 DIAGNOSIS — E039 Hypothyroidism, unspecified: Secondary | ICD-10-CM | POA: Diagnosis not present

## 2017-02-01 DIAGNOSIS — D500 Iron deficiency anemia secondary to blood loss (chronic): Secondary | ICD-10-CM | POA: Diagnosis not present

## 2017-02-01 DIAGNOSIS — J449 Chronic obstructive pulmonary disease, unspecified: Secondary | ICD-10-CM | POA: Diagnosis not present

## 2017-02-01 DIAGNOSIS — I679 Cerebrovascular disease, unspecified: Secondary | ICD-10-CM | POA: Diagnosis not present

## 2017-02-01 DIAGNOSIS — E46 Unspecified protein-calorie malnutrition: Secondary | ICD-10-CM | POA: Diagnosis not present

## 2017-02-01 DIAGNOSIS — S225XXB Flail chest, initial encounter for open fracture: Secondary | ICD-10-CM | POA: Diagnosis not present

## 2017-02-01 DIAGNOSIS — F339 Major depressive disorder, recurrent, unspecified: Secondary | ICD-10-CM | POA: Diagnosis not present

## 2017-02-01 DIAGNOSIS — S299XXA Unspecified injury of thorax, initial encounter: Secondary | ICD-10-CM | POA: Diagnosis not present

## 2017-02-01 DIAGNOSIS — K219 Gastro-esophageal reflux disease without esophagitis: Secondary | ICD-10-CM | POA: Diagnosis not present

## 2017-02-04 DIAGNOSIS — J189 Pneumonia, unspecified organism: Secondary | ICD-10-CM | POA: Diagnosis not present

## 2017-02-04 DIAGNOSIS — J449 Chronic obstructive pulmonary disease, unspecified: Secondary | ICD-10-CM | POA: Diagnosis not present

## 2017-02-04 DIAGNOSIS — I679 Cerebrovascular disease, unspecified: Secondary | ICD-10-CM | POA: Diagnosis not present

## 2017-02-04 DIAGNOSIS — S299XXA Unspecified injury of thorax, initial encounter: Secondary | ICD-10-CM | POA: Diagnosis not present

## 2017-02-04 DIAGNOSIS — I5030 Unspecified diastolic (congestive) heart failure: Secondary | ICD-10-CM | POA: Diagnosis not present

## 2017-02-04 DIAGNOSIS — S225XXB Flail chest, initial encounter for open fracture: Secondary | ICD-10-CM | POA: Diagnosis not present

## 2017-02-06 DIAGNOSIS — S299XXA Unspecified injury of thorax, initial encounter: Secondary | ICD-10-CM | POA: Diagnosis not present

## 2017-02-06 DIAGNOSIS — I679 Cerebrovascular disease, unspecified: Secondary | ICD-10-CM | POA: Diagnosis not present

## 2017-02-06 DIAGNOSIS — J449 Chronic obstructive pulmonary disease, unspecified: Secondary | ICD-10-CM | POA: Diagnosis not present

## 2017-02-06 DIAGNOSIS — I5030 Unspecified diastolic (congestive) heart failure: Secondary | ICD-10-CM | POA: Diagnosis not present

## 2017-02-06 DIAGNOSIS — J189 Pneumonia, unspecified organism: Secondary | ICD-10-CM | POA: Diagnosis not present

## 2017-02-06 DIAGNOSIS — S225XXB Flail chest, initial encounter for open fracture: Secondary | ICD-10-CM | POA: Diagnosis not present

## 2017-02-08 DIAGNOSIS — S299XXA Unspecified injury of thorax, initial encounter: Secondary | ICD-10-CM | POA: Diagnosis not present

## 2017-02-08 DIAGNOSIS — I5030 Unspecified diastolic (congestive) heart failure: Secondary | ICD-10-CM | POA: Diagnosis not present

## 2017-02-08 DIAGNOSIS — I679 Cerebrovascular disease, unspecified: Secondary | ICD-10-CM | POA: Diagnosis not present

## 2017-02-08 DIAGNOSIS — J189 Pneumonia, unspecified organism: Secondary | ICD-10-CM | POA: Diagnosis not present

## 2017-02-08 DIAGNOSIS — J449 Chronic obstructive pulmonary disease, unspecified: Secondary | ICD-10-CM | POA: Diagnosis not present

## 2017-02-08 DIAGNOSIS — S225XXB Flail chest, initial encounter for open fracture: Secondary | ICD-10-CM | POA: Diagnosis not present

## 2017-02-12 DIAGNOSIS — S225XXB Flail chest, initial encounter for open fracture: Secondary | ICD-10-CM | POA: Diagnosis not present

## 2017-02-12 DIAGNOSIS — J189 Pneumonia, unspecified organism: Secondary | ICD-10-CM | POA: Diagnosis not present

## 2017-02-12 DIAGNOSIS — I5030 Unspecified diastolic (congestive) heart failure: Secondary | ICD-10-CM | POA: Diagnosis not present

## 2017-02-12 DIAGNOSIS — J449 Chronic obstructive pulmonary disease, unspecified: Secondary | ICD-10-CM | POA: Diagnosis not present

## 2017-02-12 DIAGNOSIS — I679 Cerebrovascular disease, unspecified: Secondary | ICD-10-CM | POA: Diagnosis not present

## 2017-02-12 DIAGNOSIS — S299XXA Unspecified injury of thorax, initial encounter: Secondary | ICD-10-CM | POA: Diagnosis not present

## 2017-02-13 DIAGNOSIS — I679 Cerebrovascular disease, unspecified: Secondary | ICD-10-CM | POA: Diagnosis not present

## 2017-02-13 DIAGNOSIS — I5030 Unspecified diastolic (congestive) heart failure: Secondary | ICD-10-CM | POA: Diagnosis not present

## 2017-02-13 DIAGNOSIS — J449 Chronic obstructive pulmonary disease, unspecified: Secondary | ICD-10-CM | POA: Diagnosis not present

## 2017-02-13 DIAGNOSIS — S299XXA Unspecified injury of thorax, initial encounter: Secondary | ICD-10-CM | POA: Diagnosis not present

## 2017-02-13 DIAGNOSIS — J189 Pneumonia, unspecified organism: Secondary | ICD-10-CM | POA: Diagnosis not present

## 2017-02-13 DIAGNOSIS — S225XXB Flail chest, initial encounter for open fracture: Secondary | ICD-10-CM | POA: Diagnosis not present

## 2017-02-15 DIAGNOSIS — I679 Cerebrovascular disease, unspecified: Secondary | ICD-10-CM | POA: Diagnosis not present

## 2017-02-15 DIAGNOSIS — S225XXB Flail chest, initial encounter for open fracture: Secondary | ICD-10-CM | POA: Diagnosis not present

## 2017-02-15 DIAGNOSIS — I5030 Unspecified diastolic (congestive) heart failure: Secondary | ICD-10-CM | POA: Diagnosis not present

## 2017-02-15 DIAGNOSIS — J189 Pneumonia, unspecified organism: Secondary | ICD-10-CM | POA: Diagnosis not present

## 2017-02-15 DIAGNOSIS — J449 Chronic obstructive pulmonary disease, unspecified: Secondary | ICD-10-CM | POA: Diagnosis not present

## 2017-02-15 DIAGNOSIS — S299XXA Unspecified injury of thorax, initial encounter: Secondary | ICD-10-CM | POA: Diagnosis not present

## 2017-02-18 DIAGNOSIS — I679 Cerebrovascular disease, unspecified: Secondary | ICD-10-CM | POA: Diagnosis not present

## 2017-02-18 DIAGNOSIS — S225XXB Flail chest, initial encounter for open fracture: Secondary | ICD-10-CM | POA: Diagnosis not present

## 2017-02-18 DIAGNOSIS — J449 Chronic obstructive pulmonary disease, unspecified: Secondary | ICD-10-CM | POA: Diagnosis not present

## 2017-02-18 DIAGNOSIS — S299XXA Unspecified injury of thorax, initial encounter: Secondary | ICD-10-CM | POA: Diagnosis not present

## 2017-02-18 DIAGNOSIS — J189 Pneumonia, unspecified organism: Secondary | ICD-10-CM | POA: Diagnosis not present

## 2017-02-18 DIAGNOSIS — I5030 Unspecified diastolic (congestive) heart failure: Secondary | ICD-10-CM | POA: Diagnosis not present

## 2017-02-20 DIAGNOSIS — S299XXA Unspecified injury of thorax, initial encounter: Secondary | ICD-10-CM | POA: Diagnosis not present

## 2017-02-20 DIAGNOSIS — I5030 Unspecified diastolic (congestive) heart failure: Secondary | ICD-10-CM | POA: Diagnosis not present

## 2017-02-20 DIAGNOSIS — I679 Cerebrovascular disease, unspecified: Secondary | ICD-10-CM | POA: Diagnosis not present

## 2017-02-20 DIAGNOSIS — J449 Chronic obstructive pulmonary disease, unspecified: Secondary | ICD-10-CM | POA: Diagnosis not present

## 2017-02-20 DIAGNOSIS — J189 Pneumonia, unspecified organism: Secondary | ICD-10-CM | POA: Diagnosis not present

## 2017-02-20 DIAGNOSIS — S225XXB Flail chest, initial encounter for open fracture: Secondary | ICD-10-CM | POA: Diagnosis not present

## 2017-02-21 DIAGNOSIS — J449 Chronic obstructive pulmonary disease, unspecified: Secondary | ICD-10-CM | POA: Diagnosis not present

## 2017-02-21 DIAGNOSIS — I679 Cerebrovascular disease, unspecified: Secondary | ICD-10-CM | POA: Diagnosis not present

## 2017-02-21 DIAGNOSIS — S299XXA Unspecified injury of thorax, initial encounter: Secondary | ICD-10-CM | POA: Diagnosis not present

## 2017-02-21 DIAGNOSIS — I5030 Unspecified diastolic (congestive) heart failure: Secondary | ICD-10-CM | POA: Diagnosis not present

## 2017-02-21 DIAGNOSIS — S225XXB Flail chest, initial encounter for open fracture: Secondary | ICD-10-CM | POA: Diagnosis not present

## 2017-02-21 DIAGNOSIS — J189 Pneumonia, unspecified organism: Secondary | ICD-10-CM | POA: Diagnosis not present

## 2017-02-26 DIAGNOSIS — S225XXB Flail chest, initial encounter for open fracture: Secondary | ICD-10-CM | POA: Diagnosis not present

## 2017-02-26 DIAGNOSIS — I5030 Unspecified diastolic (congestive) heart failure: Secondary | ICD-10-CM | POA: Diagnosis not present

## 2017-02-26 DIAGNOSIS — I679 Cerebrovascular disease, unspecified: Secondary | ICD-10-CM | POA: Diagnosis not present

## 2017-02-26 DIAGNOSIS — J189 Pneumonia, unspecified organism: Secondary | ICD-10-CM | POA: Diagnosis not present

## 2017-02-26 DIAGNOSIS — S299XXA Unspecified injury of thorax, initial encounter: Secondary | ICD-10-CM | POA: Diagnosis not present

## 2017-02-26 DIAGNOSIS — J449 Chronic obstructive pulmonary disease, unspecified: Secondary | ICD-10-CM | POA: Diagnosis not present

## 2017-02-27 DIAGNOSIS — I5030 Unspecified diastolic (congestive) heart failure: Secondary | ICD-10-CM | POA: Diagnosis not present

## 2017-02-27 DIAGNOSIS — J189 Pneumonia, unspecified organism: Secondary | ICD-10-CM | POA: Diagnosis not present

## 2017-02-27 DIAGNOSIS — S299XXA Unspecified injury of thorax, initial encounter: Secondary | ICD-10-CM | POA: Diagnosis not present

## 2017-02-27 DIAGNOSIS — J449 Chronic obstructive pulmonary disease, unspecified: Secondary | ICD-10-CM | POA: Diagnosis not present

## 2017-02-27 DIAGNOSIS — I679 Cerebrovascular disease, unspecified: Secondary | ICD-10-CM | POA: Diagnosis not present

## 2017-02-27 DIAGNOSIS — S225XXB Flail chest, initial encounter for open fracture: Secondary | ICD-10-CM | POA: Diagnosis not present

## 2017-03-01 DIAGNOSIS — I5030 Unspecified diastolic (congestive) heart failure: Secondary | ICD-10-CM | POA: Diagnosis not present

## 2017-03-01 DIAGNOSIS — J189 Pneumonia, unspecified organism: Secondary | ICD-10-CM | POA: Diagnosis not present

## 2017-03-01 DIAGNOSIS — S225XXB Flail chest, initial encounter for open fracture: Secondary | ICD-10-CM | POA: Diagnosis not present

## 2017-03-01 DIAGNOSIS — S299XXA Unspecified injury of thorax, initial encounter: Secondary | ICD-10-CM | POA: Diagnosis not present

## 2017-03-01 DIAGNOSIS — J449 Chronic obstructive pulmonary disease, unspecified: Secondary | ICD-10-CM | POA: Diagnosis not present

## 2017-03-01 DIAGNOSIS — I679 Cerebrovascular disease, unspecified: Secondary | ICD-10-CM | POA: Diagnosis not present

## 2017-03-03 DIAGNOSIS — D500 Iron deficiency anemia secondary to blood loss (chronic): Secondary | ICD-10-CM | POA: Diagnosis not present

## 2017-03-03 DIAGNOSIS — S225XXB Flail chest, initial encounter for open fracture: Secondary | ICD-10-CM | POA: Diagnosis not present

## 2017-03-03 DIAGNOSIS — F339 Major depressive disorder, recurrent, unspecified: Secondary | ICD-10-CM | POA: Diagnosis not present

## 2017-03-03 DIAGNOSIS — K219 Gastro-esophageal reflux disease without esophagitis: Secondary | ICD-10-CM | POA: Diagnosis not present

## 2017-03-03 DIAGNOSIS — I5030 Unspecified diastolic (congestive) heart failure: Secondary | ICD-10-CM | POA: Diagnosis not present

## 2017-03-03 DIAGNOSIS — I10 Essential (primary) hypertension: Secondary | ICD-10-CM | POA: Diagnosis not present

## 2017-03-03 DIAGNOSIS — E46 Unspecified protein-calorie malnutrition: Secondary | ICD-10-CM | POA: Diagnosis not present

## 2017-03-03 DIAGNOSIS — J449 Chronic obstructive pulmonary disease, unspecified: Secondary | ICD-10-CM | POA: Diagnosis not present

## 2017-03-03 DIAGNOSIS — I679 Cerebrovascular disease, unspecified: Secondary | ICD-10-CM | POA: Diagnosis not present

## 2017-03-03 DIAGNOSIS — I4891 Unspecified atrial fibrillation: Secondary | ICD-10-CM | POA: Diagnosis not present

## 2017-03-03 DIAGNOSIS — E039 Hypothyroidism, unspecified: Secondary | ICD-10-CM | POA: Diagnosis not present

## 2017-03-03 DIAGNOSIS — J189 Pneumonia, unspecified organism: Secondary | ICD-10-CM | POA: Diagnosis not present

## 2017-03-03 DIAGNOSIS — S299XXA Unspecified injury of thorax, initial encounter: Secondary | ICD-10-CM | POA: Diagnosis not present

## 2017-03-03 DIAGNOSIS — J9620 Acute and chronic respiratory failure, unspecified whether with hypoxia or hypercapnia: Secondary | ICD-10-CM | POA: Diagnosis not present

## 2017-03-07 DIAGNOSIS — J189 Pneumonia, unspecified organism: Secondary | ICD-10-CM | POA: Diagnosis not present

## 2017-03-07 DIAGNOSIS — J449 Chronic obstructive pulmonary disease, unspecified: Secondary | ICD-10-CM | POA: Diagnosis not present

## 2017-03-07 DIAGNOSIS — S225XXB Flail chest, initial encounter for open fracture: Secondary | ICD-10-CM | POA: Diagnosis not present

## 2017-03-07 DIAGNOSIS — I5030 Unspecified diastolic (congestive) heart failure: Secondary | ICD-10-CM | POA: Diagnosis not present

## 2017-03-07 DIAGNOSIS — S299XXA Unspecified injury of thorax, initial encounter: Secondary | ICD-10-CM | POA: Diagnosis not present

## 2017-03-07 DIAGNOSIS — I679 Cerebrovascular disease, unspecified: Secondary | ICD-10-CM | POA: Diagnosis not present

## 2017-03-08 DIAGNOSIS — J449 Chronic obstructive pulmonary disease, unspecified: Secondary | ICD-10-CM | POA: Diagnosis not present

## 2017-03-08 DIAGNOSIS — I5030 Unspecified diastolic (congestive) heart failure: Secondary | ICD-10-CM | POA: Diagnosis not present

## 2017-03-08 DIAGNOSIS — S299XXA Unspecified injury of thorax, initial encounter: Secondary | ICD-10-CM | POA: Diagnosis not present

## 2017-03-08 DIAGNOSIS — J189 Pneumonia, unspecified organism: Secondary | ICD-10-CM | POA: Diagnosis not present

## 2017-03-08 DIAGNOSIS — I679 Cerebrovascular disease, unspecified: Secondary | ICD-10-CM | POA: Diagnosis not present

## 2017-03-08 DIAGNOSIS — S225XXB Flail chest, initial encounter for open fracture: Secondary | ICD-10-CM | POA: Diagnosis not present

## 2017-03-12 DIAGNOSIS — S299XXA Unspecified injury of thorax, initial encounter: Secondary | ICD-10-CM | POA: Diagnosis not present

## 2017-03-12 DIAGNOSIS — I679 Cerebrovascular disease, unspecified: Secondary | ICD-10-CM | POA: Diagnosis not present

## 2017-03-12 DIAGNOSIS — I5030 Unspecified diastolic (congestive) heart failure: Secondary | ICD-10-CM | POA: Diagnosis not present

## 2017-03-12 DIAGNOSIS — S225XXB Flail chest, initial encounter for open fracture: Secondary | ICD-10-CM | POA: Diagnosis not present

## 2017-03-12 DIAGNOSIS — J189 Pneumonia, unspecified organism: Secondary | ICD-10-CM | POA: Diagnosis not present

## 2017-03-12 DIAGNOSIS — J449 Chronic obstructive pulmonary disease, unspecified: Secondary | ICD-10-CM | POA: Diagnosis not present

## 2017-03-13 DIAGNOSIS — J449 Chronic obstructive pulmonary disease, unspecified: Secondary | ICD-10-CM | POA: Diagnosis not present

## 2017-03-13 DIAGNOSIS — J189 Pneumonia, unspecified organism: Secondary | ICD-10-CM | POA: Diagnosis not present

## 2017-03-13 DIAGNOSIS — S225XXB Flail chest, initial encounter for open fracture: Secondary | ICD-10-CM | POA: Diagnosis not present

## 2017-03-13 DIAGNOSIS — I679 Cerebrovascular disease, unspecified: Secondary | ICD-10-CM | POA: Diagnosis not present

## 2017-03-13 DIAGNOSIS — S299XXA Unspecified injury of thorax, initial encounter: Secondary | ICD-10-CM | POA: Diagnosis not present

## 2017-03-13 DIAGNOSIS — I5030 Unspecified diastolic (congestive) heart failure: Secondary | ICD-10-CM | POA: Diagnosis not present

## 2017-03-15 DIAGNOSIS — I5030 Unspecified diastolic (congestive) heart failure: Secondary | ICD-10-CM | POA: Diagnosis not present

## 2017-03-15 DIAGNOSIS — J189 Pneumonia, unspecified organism: Secondary | ICD-10-CM | POA: Diagnosis not present

## 2017-03-15 DIAGNOSIS — S225XXB Flail chest, initial encounter for open fracture: Secondary | ICD-10-CM | POA: Diagnosis not present

## 2017-03-15 DIAGNOSIS — S299XXA Unspecified injury of thorax, initial encounter: Secondary | ICD-10-CM | POA: Diagnosis not present

## 2017-03-15 DIAGNOSIS — J449 Chronic obstructive pulmonary disease, unspecified: Secondary | ICD-10-CM | POA: Diagnosis not present

## 2017-03-15 DIAGNOSIS — I679 Cerebrovascular disease, unspecified: Secondary | ICD-10-CM | POA: Diagnosis not present

## 2017-03-18 DIAGNOSIS — I5030 Unspecified diastolic (congestive) heart failure: Secondary | ICD-10-CM | POA: Diagnosis not present

## 2017-03-18 DIAGNOSIS — S225XXB Flail chest, initial encounter for open fracture: Secondary | ICD-10-CM | POA: Diagnosis not present

## 2017-03-18 DIAGNOSIS — S299XXA Unspecified injury of thorax, initial encounter: Secondary | ICD-10-CM | POA: Diagnosis not present

## 2017-03-18 DIAGNOSIS — I679 Cerebrovascular disease, unspecified: Secondary | ICD-10-CM | POA: Diagnosis not present

## 2017-03-18 DIAGNOSIS — J449 Chronic obstructive pulmonary disease, unspecified: Secondary | ICD-10-CM | POA: Diagnosis not present

## 2017-03-18 DIAGNOSIS — J189 Pneumonia, unspecified organism: Secondary | ICD-10-CM | POA: Diagnosis not present

## 2017-03-20 DIAGNOSIS — S225XXB Flail chest, initial encounter for open fracture: Secondary | ICD-10-CM | POA: Diagnosis not present

## 2017-03-20 DIAGNOSIS — I5030 Unspecified diastolic (congestive) heart failure: Secondary | ICD-10-CM | POA: Diagnosis not present

## 2017-03-20 DIAGNOSIS — J189 Pneumonia, unspecified organism: Secondary | ICD-10-CM | POA: Diagnosis not present

## 2017-03-20 DIAGNOSIS — I679 Cerebrovascular disease, unspecified: Secondary | ICD-10-CM | POA: Diagnosis not present

## 2017-03-20 DIAGNOSIS — S299XXA Unspecified injury of thorax, initial encounter: Secondary | ICD-10-CM | POA: Diagnosis not present

## 2017-03-20 DIAGNOSIS — J449 Chronic obstructive pulmonary disease, unspecified: Secondary | ICD-10-CM | POA: Diagnosis not present

## 2017-03-25 DIAGNOSIS — S299XXA Unspecified injury of thorax, initial encounter: Secondary | ICD-10-CM | POA: Diagnosis not present

## 2017-03-25 DIAGNOSIS — I5030 Unspecified diastolic (congestive) heart failure: Secondary | ICD-10-CM | POA: Diagnosis not present

## 2017-03-25 DIAGNOSIS — J449 Chronic obstructive pulmonary disease, unspecified: Secondary | ICD-10-CM | POA: Diagnosis not present

## 2017-03-25 DIAGNOSIS — I679 Cerebrovascular disease, unspecified: Secondary | ICD-10-CM | POA: Diagnosis not present

## 2017-03-25 DIAGNOSIS — S225XXB Flail chest, initial encounter for open fracture: Secondary | ICD-10-CM | POA: Diagnosis not present

## 2017-03-25 DIAGNOSIS — J189 Pneumonia, unspecified organism: Secondary | ICD-10-CM | POA: Diagnosis not present

## 2017-03-27 DIAGNOSIS — J189 Pneumonia, unspecified organism: Secondary | ICD-10-CM | POA: Diagnosis not present

## 2017-03-27 DIAGNOSIS — S225XXB Flail chest, initial encounter for open fracture: Secondary | ICD-10-CM | POA: Diagnosis not present

## 2017-03-27 DIAGNOSIS — I5030 Unspecified diastolic (congestive) heart failure: Secondary | ICD-10-CM | POA: Diagnosis not present

## 2017-03-27 DIAGNOSIS — I679 Cerebrovascular disease, unspecified: Secondary | ICD-10-CM | POA: Diagnosis not present

## 2017-03-27 DIAGNOSIS — J449 Chronic obstructive pulmonary disease, unspecified: Secondary | ICD-10-CM | POA: Diagnosis not present

## 2017-03-27 DIAGNOSIS — S299XXA Unspecified injury of thorax, initial encounter: Secondary | ICD-10-CM | POA: Diagnosis not present

## 2017-04-01 DIAGNOSIS — S225XXB Flail chest, initial encounter for open fracture: Secondary | ICD-10-CM | POA: Diagnosis not present

## 2017-04-01 DIAGNOSIS — J449 Chronic obstructive pulmonary disease, unspecified: Secondary | ICD-10-CM | POA: Diagnosis not present

## 2017-04-01 DIAGNOSIS — S299XXA Unspecified injury of thorax, initial encounter: Secondary | ICD-10-CM | POA: Diagnosis not present

## 2017-04-01 DIAGNOSIS — I679 Cerebrovascular disease, unspecified: Secondary | ICD-10-CM | POA: Diagnosis not present

## 2017-04-01 DIAGNOSIS — J189 Pneumonia, unspecified organism: Secondary | ICD-10-CM | POA: Diagnosis not present

## 2017-04-01 DIAGNOSIS — I5030 Unspecified diastolic (congestive) heart failure: Secondary | ICD-10-CM | POA: Diagnosis not present

## 2017-04-03 DIAGNOSIS — K219 Gastro-esophageal reflux disease without esophagitis: Secondary | ICD-10-CM | POA: Diagnosis not present

## 2017-04-03 DIAGNOSIS — I679 Cerebrovascular disease, unspecified: Secondary | ICD-10-CM | POA: Diagnosis not present

## 2017-04-03 DIAGNOSIS — F339 Major depressive disorder, recurrent, unspecified: Secondary | ICD-10-CM | POA: Diagnosis not present

## 2017-04-03 DIAGNOSIS — I5030 Unspecified diastolic (congestive) heart failure: Secondary | ICD-10-CM | POA: Diagnosis not present

## 2017-04-03 DIAGNOSIS — E46 Unspecified protein-calorie malnutrition: Secondary | ICD-10-CM | POA: Diagnosis not present

## 2017-04-03 DIAGNOSIS — D500 Iron deficiency anemia secondary to blood loss (chronic): Secondary | ICD-10-CM | POA: Diagnosis not present

## 2017-04-03 DIAGNOSIS — I4891 Unspecified atrial fibrillation: Secondary | ICD-10-CM | POA: Diagnosis not present

## 2017-04-03 DIAGNOSIS — J189 Pneumonia, unspecified organism: Secondary | ICD-10-CM | POA: Diagnosis not present

## 2017-04-03 DIAGNOSIS — J449 Chronic obstructive pulmonary disease, unspecified: Secondary | ICD-10-CM | POA: Diagnosis not present

## 2017-04-03 DIAGNOSIS — E039 Hypothyroidism, unspecified: Secondary | ICD-10-CM | POA: Diagnosis not present

## 2017-04-03 DIAGNOSIS — I10 Essential (primary) hypertension: Secondary | ICD-10-CM | POA: Diagnosis not present

## 2017-04-03 DIAGNOSIS — S225XXB Flail chest, initial encounter for open fracture: Secondary | ICD-10-CM | POA: Diagnosis not present

## 2017-04-03 DIAGNOSIS — S299XXA Unspecified injury of thorax, initial encounter: Secondary | ICD-10-CM | POA: Diagnosis not present

## 2017-04-03 DIAGNOSIS — J9620 Acute and chronic respiratory failure, unspecified whether with hypoxia or hypercapnia: Secondary | ICD-10-CM | POA: Diagnosis not present

## 2017-04-04 DIAGNOSIS — I679 Cerebrovascular disease, unspecified: Secondary | ICD-10-CM | POA: Diagnosis not present

## 2017-04-04 DIAGNOSIS — J449 Chronic obstructive pulmonary disease, unspecified: Secondary | ICD-10-CM | POA: Diagnosis not present

## 2017-04-04 DIAGNOSIS — S299XXA Unspecified injury of thorax, initial encounter: Secondary | ICD-10-CM | POA: Diagnosis not present

## 2017-04-04 DIAGNOSIS — J189 Pneumonia, unspecified organism: Secondary | ICD-10-CM | POA: Diagnosis not present

## 2017-04-04 DIAGNOSIS — S225XXB Flail chest, initial encounter for open fracture: Secondary | ICD-10-CM | POA: Diagnosis not present

## 2017-04-04 DIAGNOSIS — I5030 Unspecified diastolic (congestive) heart failure: Secondary | ICD-10-CM | POA: Diagnosis not present

## 2017-04-08 DIAGNOSIS — I679 Cerebrovascular disease, unspecified: Secondary | ICD-10-CM | POA: Diagnosis not present

## 2017-04-08 DIAGNOSIS — S299XXA Unspecified injury of thorax, initial encounter: Secondary | ICD-10-CM | POA: Diagnosis not present

## 2017-04-08 DIAGNOSIS — I5030 Unspecified diastolic (congestive) heart failure: Secondary | ICD-10-CM | POA: Diagnosis not present

## 2017-04-08 DIAGNOSIS — S225XXB Flail chest, initial encounter for open fracture: Secondary | ICD-10-CM | POA: Diagnosis not present

## 2017-04-08 DIAGNOSIS — J189 Pneumonia, unspecified organism: Secondary | ICD-10-CM | POA: Diagnosis not present

## 2017-04-08 DIAGNOSIS — J449 Chronic obstructive pulmonary disease, unspecified: Secondary | ICD-10-CM | POA: Diagnosis not present

## 2017-04-10 DIAGNOSIS — S299XXA Unspecified injury of thorax, initial encounter: Secondary | ICD-10-CM | POA: Diagnosis not present

## 2017-04-10 DIAGNOSIS — I679 Cerebrovascular disease, unspecified: Secondary | ICD-10-CM | POA: Diagnosis not present

## 2017-04-10 DIAGNOSIS — J449 Chronic obstructive pulmonary disease, unspecified: Secondary | ICD-10-CM | POA: Diagnosis not present

## 2017-04-10 DIAGNOSIS — S225XXB Flail chest, initial encounter for open fracture: Secondary | ICD-10-CM | POA: Diagnosis not present

## 2017-04-10 DIAGNOSIS — J189 Pneumonia, unspecified organism: Secondary | ICD-10-CM | POA: Diagnosis not present

## 2017-04-10 DIAGNOSIS — I5030 Unspecified diastolic (congestive) heart failure: Secondary | ICD-10-CM | POA: Diagnosis not present

## 2017-04-13 DIAGNOSIS — I5030 Unspecified diastolic (congestive) heart failure: Secondary | ICD-10-CM | POA: Diagnosis not present

## 2017-04-13 DIAGNOSIS — S225XXB Flail chest, initial encounter for open fracture: Secondary | ICD-10-CM | POA: Diagnosis not present

## 2017-04-13 DIAGNOSIS — S299XXA Unspecified injury of thorax, initial encounter: Secondary | ICD-10-CM | POA: Diagnosis not present

## 2017-04-13 DIAGNOSIS — J449 Chronic obstructive pulmonary disease, unspecified: Secondary | ICD-10-CM | POA: Diagnosis not present

## 2017-04-13 DIAGNOSIS — I679 Cerebrovascular disease, unspecified: Secondary | ICD-10-CM | POA: Diagnosis not present

## 2017-04-13 DIAGNOSIS — J189 Pneumonia, unspecified organism: Secondary | ICD-10-CM | POA: Diagnosis not present

## 2017-04-15 DIAGNOSIS — I679 Cerebrovascular disease, unspecified: Secondary | ICD-10-CM | POA: Diagnosis not present

## 2017-04-15 DIAGNOSIS — J189 Pneumonia, unspecified organism: Secondary | ICD-10-CM | POA: Diagnosis not present

## 2017-04-15 DIAGNOSIS — I5030 Unspecified diastolic (congestive) heart failure: Secondary | ICD-10-CM | POA: Diagnosis not present

## 2017-04-15 DIAGNOSIS — S299XXA Unspecified injury of thorax, initial encounter: Secondary | ICD-10-CM | POA: Diagnosis not present

## 2017-04-15 DIAGNOSIS — J449 Chronic obstructive pulmonary disease, unspecified: Secondary | ICD-10-CM | POA: Diagnosis not present

## 2017-04-15 DIAGNOSIS — S225XXB Flail chest, initial encounter for open fracture: Secondary | ICD-10-CM | POA: Diagnosis not present

## 2017-04-17 DIAGNOSIS — I679 Cerebrovascular disease, unspecified: Secondary | ICD-10-CM | POA: Diagnosis not present

## 2017-04-17 DIAGNOSIS — I5030 Unspecified diastolic (congestive) heart failure: Secondary | ICD-10-CM | POA: Diagnosis not present

## 2017-04-17 DIAGNOSIS — J449 Chronic obstructive pulmonary disease, unspecified: Secondary | ICD-10-CM | POA: Diagnosis not present

## 2017-04-17 DIAGNOSIS — J189 Pneumonia, unspecified organism: Secondary | ICD-10-CM | POA: Diagnosis not present

## 2017-04-17 DIAGNOSIS — S299XXA Unspecified injury of thorax, initial encounter: Secondary | ICD-10-CM | POA: Diagnosis not present

## 2017-04-17 DIAGNOSIS — S225XXB Flail chest, initial encounter for open fracture: Secondary | ICD-10-CM | POA: Diagnosis not present

## 2017-04-21 DIAGNOSIS — S299XXA Unspecified injury of thorax, initial encounter: Secondary | ICD-10-CM | POA: Diagnosis not present

## 2017-04-21 DIAGNOSIS — I5030 Unspecified diastolic (congestive) heart failure: Secondary | ICD-10-CM | POA: Diagnosis not present

## 2017-04-21 DIAGNOSIS — S225XXB Flail chest, initial encounter for open fracture: Secondary | ICD-10-CM | POA: Diagnosis not present

## 2017-04-21 DIAGNOSIS — I679 Cerebrovascular disease, unspecified: Secondary | ICD-10-CM | POA: Diagnosis not present

## 2017-04-21 DIAGNOSIS — J189 Pneumonia, unspecified organism: Secondary | ICD-10-CM | POA: Diagnosis not present

## 2017-04-21 DIAGNOSIS — J449 Chronic obstructive pulmonary disease, unspecified: Secondary | ICD-10-CM | POA: Diagnosis not present

## 2017-04-22 DIAGNOSIS — J449 Chronic obstructive pulmonary disease, unspecified: Secondary | ICD-10-CM | POA: Diagnosis not present

## 2017-04-22 DIAGNOSIS — I679 Cerebrovascular disease, unspecified: Secondary | ICD-10-CM | POA: Diagnosis not present

## 2017-04-22 DIAGNOSIS — I5030 Unspecified diastolic (congestive) heart failure: Secondary | ICD-10-CM | POA: Diagnosis not present

## 2017-04-22 DIAGNOSIS — S225XXB Flail chest, initial encounter for open fracture: Secondary | ICD-10-CM | POA: Diagnosis not present

## 2017-04-22 DIAGNOSIS — J189 Pneumonia, unspecified organism: Secondary | ICD-10-CM | POA: Diagnosis not present

## 2017-04-22 DIAGNOSIS — S299XXA Unspecified injury of thorax, initial encounter: Secondary | ICD-10-CM | POA: Diagnosis not present

## 2017-04-24 DIAGNOSIS — S225XXB Flail chest, initial encounter for open fracture: Secondary | ICD-10-CM | POA: Diagnosis not present

## 2017-04-24 DIAGNOSIS — S299XXA Unspecified injury of thorax, initial encounter: Secondary | ICD-10-CM | POA: Diagnosis not present

## 2017-04-24 DIAGNOSIS — I679 Cerebrovascular disease, unspecified: Secondary | ICD-10-CM | POA: Diagnosis not present

## 2017-04-24 DIAGNOSIS — J189 Pneumonia, unspecified organism: Secondary | ICD-10-CM | POA: Diagnosis not present

## 2017-04-24 DIAGNOSIS — J449 Chronic obstructive pulmonary disease, unspecified: Secondary | ICD-10-CM | POA: Diagnosis not present

## 2017-04-24 DIAGNOSIS — I5030 Unspecified diastolic (congestive) heart failure: Secondary | ICD-10-CM | POA: Diagnosis not present

## 2017-04-29 DIAGNOSIS — I5030 Unspecified diastolic (congestive) heart failure: Secondary | ICD-10-CM | POA: Diagnosis not present

## 2017-04-29 DIAGNOSIS — J189 Pneumonia, unspecified organism: Secondary | ICD-10-CM | POA: Diagnosis not present

## 2017-04-29 DIAGNOSIS — I679 Cerebrovascular disease, unspecified: Secondary | ICD-10-CM | POA: Diagnosis not present

## 2017-04-29 DIAGNOSIS — S225XXB Flail chest, initial encounter for open fracture: Secondary | ICD-10-CM | POA: Diagnosis not present

## 2017-04-29 DIAGNOSIS — J449 Chronic obstructive pulmonary disease, unspecified: Secondary | ICD-10-CM | POA: Diagnosis not present

## 2017-04-29 DIAGNOSIS — S299XXA Unspecified injury of thorax, initial encounter: Secondary | ICD-10-CM | POA: Diagnosis not present

## 2017-05-01 DIAGNOSIS — I679 Cerebrovascular disease, unspecified: Secondary | ICD-10-CM | POA: Diagnosis not present

## 2017-05-01 DIAGNOSIS — J449 Chronic obstructive pulmonary disease, unspecified: Secondary | ICD-10-CM | POA: Diagnosis not present

## 2017-05-01 DIAGNOSIS — S225XXB Flail chest, initial encounter for open fracture: Secondary | ICD-10-CM | POA: Diagnosis not present

## 2017-05-01 DIAGNOSIS — S299XXA Unspecified injury of thorax, initial encounter: Secondary | ICD-10-CM | POA: Diagnosis not present

## 2017-05-01 DIAGNOSIS — J189 Pneumonia, unspecified organism: Secondary | ICD-10-CM | POA: Diagnosis not present

## 2017-05-01 DIAGNOSIS — I5030 Unspecified diastolic (congestive) heart failure: Secondary | ICD-10-CM | POA: Diagnosis not present

## 2017-05-03 DIAGNOSIS — I5030 Unspecified diastolic (congestive) heart failure: Secondary | ICD-10-CM | POA: Diagnosis not present

## 2017-05-03 DIAGNOSIS — S225XXB Flail chest, initial encounter for open fracture: Secondary | ICD-10-CM | POA: Diagnosis not present

## 2017-05-03 DIAGNOSIS — I679 Cerebrovascular disease, unspecified: Secondary | ICD-10-CM | POA: Diagnosis not present

## 2017-05-03 DIAGNOSIS — J449 Chronic obstructive pulmonary disease, unspecified: Secondary | ICD-10-CM | POA: Diagnosis not present

## 2017-05-03 DIAGNOSIS — S299XXA Unspecified injury of thorax, initial encounter: Secondary | ICD-10-CM | POA: Diagnosis not present

## 2017-05-03 DIAGNOSIS — J189 Pneumonia, unspecified organism: Secondary | ICD-10-CM | POA: Diagnosis not present

## 2017-05-04 DIAGNOSIS — E039 Hypothyroidism, unspecified: Secondary | ICD-10-CM | POA: Diagnosis not present

## 2017-05-04 DIAGNOSIS — I679 Cerebrovascular disease, unspecified: Secondary | ICD-10-CM | POA: Diagnosis not present

## 2017-05-04 DIAGNOSIS — S225XXB Flail chest, initial encounter for open fracture: Secondary | ICD-10-CM | POA: Diagnosis not present

## 2017-05-04 DIAGNOSIS — J189 Pneumonia, unspecified organism: Secondary | ICD-10-CM | POA: Diagnosis not present

## 2017-05-04 DIAGNOSIS — K219 Gastro-esophageal reflux disease without esophagitis: Secondary | ICD-10-CM | POA: Diagnosis not present

## 2017-05-04 DIAGNOSIS — S299XXA Unspecified injury of thorax, initial encounter: Secondary | ICD-10-CM | POA: Diagnosis not present

## 2017-05-04 DIAGNOSIS — D500 Iron deficiency anemia secondary to blood loss (chronic): Secondary | ICD-10-CM | POA: Diagnosis not present

## 2017-05-04 DIAGNOSIS — I5030 Unspecified diastolic (congestive) heart failure: Secondary | ICD-10-CM | POA: Diagnosis not present

## 2017-05-04 DIAGNOSIS — F339 Major depressive disorder, recurrent, unspecified: Secondary | ICD-10-CM | POA: Diagnosis not present

## 2017-05-04 DIAGNOSIS — J9620 Acute and chronic respiratory failure, unspecified whether with hypoxia or hypercapnia: Secondary | ICD-10-CM | POA: Diagnosis not present

## 2017-05-04 DIAGNOSIS — I4891 Unspecified atrial fibrillation: Secondary | ICD-10-CM | POA: Diagnosis not present

## 2017-05-04 DIAGNOSIS — E46 Unspecified protein-calorie malnutrition: Secondary | ICD-10-CM | POA: Diagnosis not present

## 2017-05-04 DIAGNOSIS — I10 Essential (primary) hypertension: Secondary | ICD-10-CM | POA: Diagnosis not present

## 2017-05-04 DIAGNOSIS — J449 Chronic obstructive pulmonary disease, unspecified: Secondary | ICD-10-CM | POA: Diagnosis not present

## 2017-05-07 DIAGNOSIS — S299XXA Unspecified injury of thorax, initial encounter: Secondary | ICD-10-CM | POA: Diagnosis not present

## 2017-05-07 DIAGNOSIS — S225XXB Flail chest, initial encounter for open fracture: Secondary | ICD-10-CM | POA: Diagnosis not present

## 2017-05-07 DIAGNOSIS — J189 Pneumonia, unspecified organism: Secondary | ICD-10-CM | POA: Diagnosis not present

## 2017-05-07 DIAGNOSIS — I5030 Unspecified diastolic (congestive) heart failure: Secondary | ICD-10-CM | POA: Diagnosis not present

## 2017-05-07 DIAGNOSIS — I679 Cerebrovascular disease, unspecified: Secondary | ICD-10-CM | POA: Diagnosis not present

## 2017-05-07 DIAGNOSIS — J449 Chronic obstructive pulmonary disease, unspecified: Secondary | ICD-10-CM | POA: Diagnosis not present

## 2017-05-08 DIAGNOSIS — S299XXA Unspecified injury of thorax, initial encounter: Secondary | ICD-10-CM | POA: Diagnosis not present

## 2017-05-08 DIAGNOSIS — J449 Chronic obstructive pulmonary disease, unspecified: Secondary | ICD-10-CM | POA: Diagnosis not present

## 2017-05-08 DIAGNOSIS — I5030 Unspecified diastolic (congestive) heart failure: Secondary | ICD-10-CM | POA: Diagnosis not present

## 2017-05-08 DIAGNOSIS — S225XXB Flail chest, initial encounter for open fracture: Secondary | ICD-10-CM | POA: Diagnosis not present

## 2017-05-08 DIAGNOSIS — J189 Pneumonia, unspecified organism: Secondary | ICD-10-CM | POA: Diagnosis not present

## 2017-05-08 DIAGNOSIS — I679 Cerebrovascular disease, unspecified: Secondary | ICD-10-CM | POA: Diagnosis not present

## 2017-05-13 DIAGNOSIS — S225XXB Flail chest, initial encounter for open fracture: Secondary | ICD-10-CM | POA: Diagnosis not present

## 2017-05-13 DIAGNOSIS — S299XXA Unspecified injury of thorax, initial encounter: Secondary | ICD-10-CM | POA: Diagnosis not present

## 2017-05-13 DIAGNOSIS — J449 Chronic obstructive pulmonary disease, unspecified: Secondary | ICD-10-CM | POA: Diagnosis not present

## 2017-05-13 DIAGNOSIS — I679 Cerebrovascular disease, unspecified: Secondary | ICD-10-CM | POA: Diagnosis not present

## 2017-05-13 DIAGNOSIS — J189 Pneumonia, unspecified organism: Secondary | ICD-10-CM | POA: Diagnosis not present

## 2017-05-13 DIAGNOSIS — I5030 Unspecified diastolic (congestive) heart failure: Secondary | ICD-10-CM | POA: Diagnosis not present

## 2017-05-15 DIAGNOSIS — S299XXA Unspecified injury of thorax, initial encounter: Secondary | ICD-10-CM | POA: Diagnosis not present

## 2017-05-15 DIAGNOSIS — J449 Chronic obstructive pulmonary disease, unspecified: Secondary | ICD-10-CM | POA: Diagnosis not present

## 2017-05-15 DIAGNOSIS — I5030 Unspecified diastolic (congestive) heart failure: Secondary | ICD-10-CM | POA: Diagnosis not present

## 2017-05-15 DIAGNOSIS — S225XXB Flail chest, initial encounter for open fracture: Secondary | ICD-10-CM | POA: Diagnosis not present

## 2017-05-15 DIAGNOSIS — I679 Cerebrovascular disease, unspecified: Secondary | ICD-10-CM | POA: Diagnosis not present

## 2017-05-15 DIAGNOSIS — J189 Pneumonia, unspecified organism: Secondary | ICD-10-CM | POA: Diagnosis not present

## 2017-05-20 DIAGNOSIS — I5030 Unspecified diastolic (congestive) heart failure: Secondary | ICD-10-CM | POA: Diagnosis not present

## 2017-05-20 DIAGNOSIS — I679 Cerebrovascular disease, unspecified: Secondary | ICD-10-CM | POA: Diagnosis not present

## 2017-05-20 DIAGNOSIS — S299XXA Unspecified injury of thorax, initial encounter: Secondary | ICD-10-CM | POA: Diagnosis not present

## 2017-05-20 DIAGNOSIS — J189 Pneumonia, unspecified organism: Secondary | ICD-10-CM | POA: Diagnosis not present

## 2017-05-20 DIAGNOSIS — S225XXB Flail chest, initial encounter for open fracture: Secondary | ICD-10-CM | POA: Diagnosis not present

## 2017-05-20 DIAGNOSIS — J449 Chronic obstructive pulmonary disease, unspecified: Secondary | ICD-10-CM | POA: Diagnosis not present

## 2017-05-22 DIAGNOSIS — S299XXA Unspecified injury of thorax, initial encounter: Secondary | ICD-10-CM | POA: Diagnosis not present

## 2017-05-22 DIAGNOSIS — J189 Pneumonia, unspecified organism: Secondary | ICD-10-CM | POA: Diagnosis not present

## 2017-05-22 DIAGNOSIS — S225XXB Flail chest, initial encounter for open fracture: Secondary | ICD-10-CM | POA: Diagnosis not present

## 2017-05-22 DIAGNOSIS — J449 Chronic obstructive pulmonary disease, unspecified: Secondary | ICD-10-CM | POA: Diagnosis not present

## 2017-05-22 DIAGNOSIS — I5030 Unspecified diastolic (congestive) heart failure: Secondary | ICD-10-CM | POA: Diagnosis not present

## 2017-05-22 DIAGNOSIS — I679 Cerebrovascular disease, unspecified: Secondary | ICD-10-CM | POA: Diagnosis not present

## 2017-05-27 DIAGNOSIS — I5030 Unspecified diastolic (congestive) heart failure: Secondary | ICD-10-CM | POA: Diagnosis not present

## 2017-05-27 DIAGNOSIS — S225XXB Flail chest, initial encounter for open fracture: Secondary | ICD-10-CM | POA: Diagnosis not present

## 2017-05-27 DIAGNOSIS — S299XXA Unspecified injury of thorax, initial encounter: Secondary | ICD-10-CM | POA: Diagnosis not present

## 2017-05-27 DIAGNOSIS — I679 Cerebrovascular disease, unspecified: Secondary | ICD-10-CM | POA: Diagnosis not present

## 2017-05-27 DIAGNOSIS — J449 Chronic obstructive pulmonary disease, unspecified: Secondary | ICD-10-CM | POA: Diagnosis not present

## 2017-05-27 DIAGNOSIS — J189 Pneumonia, unspecified organism: Secondary | ICD-10-CM | POA: Diagnosis not present

## 2017-05-29 DIAGNOSIS — J189 Pneumonia, unspecified organism: Secondary | ICD-10-CM | POA: Diagnosis not present

## 2017-05-29 DIAGNOSIS — S225XXB Flail chest, initial encounter for open fracture: Secondary | ICD-10-CM | POA: Diagnosis not present

## 2017-05-29 DIAGNOSIS — I5030 Unspecified diastolic (congestive) heart failure: Secondary | ICD-10-CM | POA: Diagnosis not present

## 2017-05-29 DIAGNOSIS — S299XXA Unspecified injury of thorax, initial encounter: Secondary | ICD-10-CM | POA: Diagnosis not present

## 2017-05-29 DIAGNOSIS — I679 Cerebrovascular disease, unspecified: Secondary | ICD-10-CM | POA: Diagnosis not present

## 2017-05-29 DIAGNOSIS — J449 Chronic obstructive pulmonary disease, unspecified: Secondary | ICD-10-CM | POA: Diagnosis not present

## 2017-05-30 DIAGNOSIS — I5030 Unspecified diastolic (congestive) heart failure: Secondary | ICD-10-CM | POA: Diagnosis not present

## 2017-05-30 DIAGNOSIS — J449 Chronic obstructive pulmonary disease, unspecified: Secondary | ICD-10-CM | POA: Diagnosis not present

## 2017-05-30 DIAGNOSIS — I679 Cerebrovascular disease, unspecified: Secondary | ICD-10-CM | POA: Diagnosis not present

## 2017-05-30 DIAGNOSIS — S225XXB Flail chest, initial encounter for open fracture: Secondary | ICD-10-CM | POA: Diagnosis not present

## 2017-05-30 DIAGNOSIS — J189 Pneumonia, unspecified organism: Secondary | ICD-10-CM | POA: Diagnosis not present

## 2017-05-30 DIAGNOSIS — S299XXA Unspecified injury of thorax, initial encounter: Secondary | ICD-10-CM | POA: Diagnosis not present

## 2017-06-03 DIAGNOSIS — J189 Pneumonia, unspecified organism: Secondary | ICD-10-CM | POA: Diagnosis not present

## 2017-06-03 DIAGNOSIS — E039 Hypothyroidism, unspecified: Secondary | ICD-10-CM | POA: Diagnosis not present

## 2017-06-03 DIAGNOSIS — I679 Cerebrovascular disease, unspecified: Secondary | ICD-10-CM | POA: Diagnosis not present

## 2017-06-03 DIAGNOSIS — K219 Gastro-esophageal reflux disease without esophagitis: Secondary | ICD-10-CM | POA: Diagnosis not present

## 2017-06-03 DIAGNOSIS — I4891 Unspecified atrial fibrillation: Secondary | ICD-10-CM | POA: Diagnosis not present

## 2017-06-03 DIAGNOSIS — J449 Chronic obstructive pulmonary disease, unspecified: Secondary | ICD-10-CM | POA: Diagnosis not present

## 2017-06-03 DIAGNOSIS — F339 Major depressive disorder, recurrent, unspecified: Secondary | ICD-10-CM | POA: Diagnosis not present

## 2017-06-03 DIAGNOSIS — E46 Unspecified protein-calorie malnutrition: Secondary | ICD-10-CM | POA: Diagnosis not present

## 2017-06-03 DIAGNOSIS — I5030 Unspecified diastolic (congestive) heart failure: Secondary | ICD-10-CM | POA: Diagnosis not present

## 2017-06-03 DIAGNOSIS — S225XXB Flail chest, initial encounter for open fracture: Secondary | ICD-10-CM | POA: Diagnosis not present

## 2017-06-03 DIAGNOSIS — I10 Essential (primary) hypertension: Secondary | ICD-10-CM | POA: Diagnosis not present

## 2017-06-03 DIAGNOSIS — S299XXA Unspecified injury of thorax, initial encounter: Secondary | ICD-10-CM | POA: Diagnosis not present

## 2017-06-03 DIAGNOSIS — J9620 Acute and chronic respiratory failure, unspecified whether with hypoxia or hypercapnia: Secondary | ICD-10-CM | POA: Diagnosis not present

## 2017-06-03 DIAGNOSIS — D500 Iron deficiency anemia secondary to blood loss (chronic): Secondary | ICD-10-CM | POA: Diagnosis not present

## 2017-06-04 DIAGNOSIS — I679 Cerebrovascular disease, unspecified: Secondary | ICD-10-CM | POA: Diagnosis not present

## 2017-06-04 DIAGNOSIS — S299XXA Unspecified injury of thorax, initial encounter: Secondary | ICD-10-CM | POA: Diagnosis not present

## 2017-06-04 DIAGNOSIS — J189 Pneumonia, unspecified organism: Secondary | ICD-10-CM | POA: Diagnosis not present

## 2017-06-04 DIAGNOSIS — S225XXB Flail chest, initial encounter for open fracture: Secondary | ICD-10-CM | POA: Diagnosis not present

## 2017-06-04 DIAGNOSIS — I5030 Unspecified diastolic (congestive) heart failure: Secondary | ICD-10-CM | POA: Diagnosis not present

## 2017-06-04 DIAGNOSIS — J449 Chronic obstructive pulmonary disease, unspecified: Secondary | ICD-10-CM | POA: Diagnosis not present

## 2017-06-05 DIAGNOSIS — J449 Chronic obstructive pulmonary disease, unspecified: Secondary | ICD-10-CM | POA: Diagnosis not present

## 2017-06-05 DIAGNOSIS — J189 Pneumonia, unspecified organism: Secondary | ICD-10-CM | POA: Diagnosis not present

## 2017-06-05 DIAGNOSIS — I679 Cerebrovascular disease, unspecified: Secondary | ICD-10-CM | POA: Diagnosis not present

## 2017-06-05 DIAGNOSIS — S299XXA Unspecified injury of thorax, initial encounter: Secondary | ICD-10-CM | POA: Diagnosis not present

## 2017-06-05 DIAGNOSIS — S225XXB Flail chest, initial encounter for open fracture: Secondary | ICD-10-CM | POA: Diagnosis not present

## 2017-06-05 DIAGNOSIS — I5030 Unspecified diastolic (congestive) heart failure: Secondary | ICD-10-CM | POA: Diagnosis not present

## 2017-06-10 DIAGNOSIS — J449 Chronic obstructive pulmonary disease, unspecified: Secondary | ICD-10-CM | POA: Diagnosis not present

## 2017-06-10 DIAGNOSIS — I5030 Unspecified diastolic (congestive) heart failure: Secondary | ICD-10-CM | POA: Diagnosis not present

## 2017-06-10 DIAGNOSIS — I679 Cerebrovascular disease, unspecified: Secondary | ICD-10-CM | POA: Diagnosis not present

## 2017-06-10 DIAGNOSIS — S299XXA Unspecified injury of thorax, initial encounter: Secondary | ICD-10-CM | POA: Diagnosis not present

## 2017-06-10 DIAGNOSIS — S225XXB Flail chest, initial encounter for open fracture: Secondary | ICD-10-CM | POA: Diagnosis not present

## 2017-06-10 DIAGNOSIS — J189 Pneumonia, unspecified organism: Secondary | ICD-10-CM | POA: Diagnosis not present

## 2017-06-12 DIAGNOSIS — J449 Chronic obstructive pulmonary disease, unspecified: Secondary | ICD-10-CM | POA: Diagnosis not present

## 2017-06-12 DIAGNOSIS — S299XXA Unspecified injury of thorax, initial encounter: Secondary | ICD-10-CM | POA: Diagnosis not present

## 2017-06-12 DIAGNOSIS — J189 Pneumonia, unspecified organism: Secondary | ICD-10-CM | POA: Diagnosis not present

## 2017-06-12 DIAGNOSIS — S225XXB Flail chest, initial encounter for open fracture: Secondary | ICD-10-CM | POA: Diagnosis not present

## 2017-06-12 DIAGNOSIS — I5030 Unspecified diastolic (congestive) heart failure: Secondary | ICD-10-CM | POA: Diagnosis not present

## 2017-06-12 DIAGNOSIS — I679 Cerebrovascular disease, unspecified: Secondary | ICD-10-CM | POA: Diagnosis not present

## 2017-06-14 DIAGNOSIS — J189 Pneumonia, unspecified organism: Secondary | ICD-10-CM | POA: Diagnosis not present

## 2017-06-14 DIAGNOSIS — J449 Chronic obstructive pulmonary disease, unspecified: Secondary | ICD-10-CM | POA: Diagnosis not present

## 2017-06-14 DIAGNOSIS — I5030 Unspecified diastolic (congestive) heart failure: Secondary | ICD-10-CM | POA: Diagnosis not present

## 2017-06-14 DIAGNOSIS — S225XXB Flail chest, initial encounter for open fracture: Secondary | ICD-10-CM | POA: Diagnosis not present

## 2017-06-14 DIAGNOSIS — I679 Cerebrovascular disease, unspecified: Secondary | ICD-10-CM | POA: Diagnosis not present

## 2017-06-14 DIAGNOSIS — S299XXA Unspecified injury of thorax, initial encounter: Secondary | ICD-10-CM | POA: Diagnosis not present

## 2017-06-17 DIAGNOSIS — S225XXB Flail chest, initial encounter for open fracture: Secondary | ICD-10-CM | POA: Diagnosis not present

## 2017-06-17 DIAGNOSIS — I679 Cerebrovascular disease, unspecified: Secondary | ICD-10-CM | POA: Diagnosis not present

## 2017-06-17 DIAGNOSIS — J189 Pneumonia, unspecified organism: Secondary | ICD-10-CM | POA: Diagnosis not present

## 2017-06-17 DIAGNOSIS — S299XXA Unspecified injury of thorax, initial encounter: Secondary | ICD-10-CM | POA: Diagnosis not present

## 2017-06-17 DIAGNOSIS — I5030 Unspecified diastolic (congestive) heart failure: Secondary | ICD-10-CM | POA: Diagnosis not present

## 2017-06-17 DIAGNOSIS — J449 Chronic obstructive pulmonary disease, unspecified: Secondary | ICD-10-CM | POA: Diagnosis not present

## 2017-06-19 DIAGNOSIS — I679 Cerebrovascular disease, unspecified: Secondary | ICD-10-CM | POA: Diagnosis not present

## 2017-06-19 DIAGNOSIS — J449 Chronic obstructive pulmonary disease, unspecified: Secondary | ICD-10-CM | POA: Diagnosis not present

## 2017-06-19 DIAGNOSIS — J189 Pneumonia, unspecified organism: Secondary | ICD-10-CM | POA: Diagnosis not present

## 2017-06-19 DIAGNOSIS — S225XXB Flail chest, initial encounter for open fracture: Secondary | ICD-10-CM | POA: Diagnosis not present

## 2017-06-19 DIAGNOSIS — I5030 Unspecified diastolic (congestive) heart failure: Secondary | ICD-10-CM | POA: Diagnosis not present

## 2017-06-19 DIAGNOSIS — S299XXA Unspecified injury of thorax, initial encounter: Secondary | ICD-10-CM | POA: Diagnosis not present

## 2017-06-24 DIAGNOSIS — J189 Pneumonia, unspecified organism: Secondary | ICD-10-CM | POA: Diagnosis not present

## 2017-06-24 DIAGNOSIS — I679 Cerebrovascular disease, unspecified: Secondary | ICD-10-CM | POA: Diagnosis not present

## 2017-06-24 DIAGNOSIS — J449 Chronic obstructive pulmonary disease, unspecified: Secondary | ICD-10-CM | POA: Diagnosis not present

## 2017-06-24 DIAGNOSIS — I5030 Unspecified diastolic (congestive) heart failure: Secondary | ICD-10-CM | POA: Diagnosis not present

## 2017-06-24 DIAGNOSIS — S299XXA Unspecified injury of thorax, initial encounter: Secondary | ICD-10-CM | POA: Diagnosis not present

## 2017-06-24 DIAGNOSIS — S225XXB Flail chest, initial encounter for open fracture: Secondary | ICD-10-CM | POA: Diagnosis not present

## 2017-06-26 DIAGNOSIS — J449 Chronic obstructive pulmonary disease, unspecified: Secondary | ICD-10-CM | POA: Diagnosis not present

## 2017-06-26 DIAGNOSIS — J189 Pneumonia, unspecified organism: Secondary | ICD-10-CM | POA: Diagnosis not present

## 2017-06-26 DIAGNOSIS — S225XXB Flail chest, initial encounter for open fracture: Secondary | ICD-10-CM | POA: Diagnosis not present

## 2017-06-26 DIAGNOSIS — S299XXA Unspecified injury of thorax, initial encounter: Secondary | ICD-10-CM | POA: Diagnosis not present

## 2017-06-26 DIAGNOSIS — I5030 Unspecified diastolic (congestive) heart failure: Secondary | ICD-10-CM | POA: Diagnosis not present

## 2017-06-26 DIAGNOSIS — I679 Cerebrovascular disease, unspecified: Secondary | ICD-10-CM | POA: Diagnosis not present

## 2017-07-01 DIAGNOSIS — I5030 Unspecified diastolic (congestive) heart failure: Secondary | ICD-10-CM | POA: Diagnosis not present

## 2017-07-01 DIAGNOSIS — S225XXB Flail chest, initial encounter for open fracture: Secondary | ICD-10-CM | POA: Diagnosis not present

## 2017-07-01 DIAGNOSIS — S299XXA Unspecified injury of thorax, initial encounter: Secondary | ICD-10-CM | POA: Diagnosis not present

## 2017-07-01 DIAGNOSIS — I679 Cerebrovascular disease, unspecified: Secondary | ICD-10-CM | POA: Diagnosis not present

## 2017-07-01 DIAGNOSIS — J449 Chronic obstructive pulmonary disease, unspecified: Secondary | ICD-10-CM | POA: Diagnosis not present

## 2017-07-01 DIAGNOSIS — J189 Pneumonia, unspecified organism: Secondary | ICD-10-CM | POA: Diagnosis not present

## 2017-07-03 DIAGNOSIS — I5030 Unspecified diastolic (congestive) heart failure: Secondary | ICD-10-CM | POA: Diagnosis not present

## 2017-07-03 DIAGNOSIS — J189 Pneumonia, unspecified organism: Secondary | ICD-10-CM | POA: Diagnosis not present

## 2017-07-03 DIAGNOSIS — J449 Chronic obstructive pulmonary disease, unspecified: Secondary | ICD-10-CM | POA: Diagnosis not present

## 2017-07-03 DIAGNOSIS — S225XXB Flail chest, initial encounter for open fracture: Secondary | ICD-10-CM | POA: Diagnosis not present

## 2017-07-03 DIAGNOSIS — I679 Cerebrovascular disease, unspecified: Secondary | ICD-10-CM | POA: Diagnosis not present

## 2017-07-03 DIAGNOSIS — S299XXA Unspecified injury of thorax, initial encounter: Secondary | ICD-10-CM | POA: Diagnosis not present

## 2017-07-04 DIAGNOSIS — D500 Iron deficiency anemia secondary to blood loss (chronic): Secondary | ICD-10-CM | POA: Diagnosis not present

## 2017-07-04 DIAGNOSIS — S299XXA Unspecified injury of thorax, initial encounter: Secondary | ICD-10-CM | POA: Diagnosis not present

## 2017-07-04 DIAGNOSIS — J449 Chronic obstructive pulmonary disease, unspecified: Secondary | ICD-10-CM | POA: Diagnosis not present

## 2017-07-04 DIAGNOSIS — J9620 Acute and chronic respiratory failure, unspecified whether with hypoxia or hypercapnia: Secondary | ICD-10-CM | POA: Diagnosis not present

## 2017-07-04 DIAGNOSIS — I10 Essential (primary) hypertension: Secondary | ICD-10-CM | POA: Diagnosis not present

## 2017-07-04 DIAGNOSIS — S225XXB Flail chest, initial encounter for open fracture: Secondary | ICD-10-CM | POA: Diagnosis not present

## 2017-07-04 DIAGNOSIS — I679 Cerebrovascular disease, unspecified: Secondary | ICD-10-CM | POA: Diagnosis not present

## 2017-07-04 DIAGNOSIS — I5030 Unspecified diastolic (congestive) heart failure: Secondary | ICD-10-CM | POA: Diagnosis not present

## 2017-07-04 DIAGNOSIS — I4891 Unspecified atrial fibrillation: Secondary | ICD-10-CM | POA: Diagnosis not present

## 2017-07-04 DIAGNOSIS — J189 Pneumonia, unspecified organism: Secondary | ICD-10-CM | POA: Diagnosis not present

## 2017-07-04 DIAGNOSIS — F339 Major depressive disorder, recurrent, unspecified: Secondary | ICD-10-CM | POA: Diagnosis not present

## 2017-07-04 DIAGNOSIS — K219 Gastro-esophageal reflux disease without esophagitis: Secondary | ICD-10-CM | POA: Diagnosis not present

## 2017-07-04 DIAGNOSIS — E46 Unspecified protein-calorie malnutrition: Secondary | ICD-10-CM | POA: Diagnosis not present

## 2017-07-04 DIAGNOSIS — E039 Hypothyroidism, unspecified: Secondary | ICD-10-CM | POA: Diagnosis not present

## 2017-07-10 DIAGNOSIS — S225XXB Flail chest, initial encounter for open fracture: Secondary | ICD-10-CM | POA: Diagnosis not present

## 2017-07-10 DIAGNOSIS — J449 Chronic obstructive pulmonary disease, unspecified: Secondary | ICD-10-CM | POA: Diagnosis not present

## 2017-07-10 DIAGNOSIS — I5030 Unspecified diastolic (congestive) heart failure: Secondary | ICD-10-CM | POA: Diagnosis not present

## 2017-07-10 DIAGNOSIS — J189 Pneumonia, unspecified organism: Secondary | ICD-10-CM | POA: Diagnosis not present

## 2017-07-10 DIAGNOSIS — I679 Cerebrovascular disease, unspecified: Secondary | ICD-10-CM | POA: Diagnosis not present

## 2017-07-10 DIAGNOSIS — S299XXA Unspecified injury of thorax, initial encounter: Secondary | ICD-10-CM | POA: Diagnosis not present

## 2017-07-12 DIAGNOSIS — J449 Chronic obstructive pulmonary disease, unspecified: Secondary | ICD-10-CM | POA: Diagnosis not present

## 2017-07-12 DIAGNOSIS — J189 Pneumonia, unspecified organism: Secondary | ICD-10-CM | POA: Diagnosis not present

## 2017-07-12 DIAGNOSIS — I679 Cerebrovascular disease, unspecified: Secondary | ICD-10-CM | POA: Diagnosis not present

## 2017-07-12 DIAGNOSIS — S225XXB Flail chest, initial encounter for open fracture: Secondary | ICD-10-CM | POA: Diagnosis not present

## 2017-07-12 DIAGNOSIS — I5030 Unspecified diastolic (congestive) heart failure: Secondary | ICD-10-CM | POA: Diagnosis not present

## 2017-07-12 DIAGNOSIS — S299XXA Unspecified injury of thorax, initial encounter: Secondary | ICD-10-CM | POA: Diagnosis not present

## 2017-07-15 DIAGNOSIS — I679 Cerebrovascular disease, unspecified: Secondary | ICD-10-CM | POA: Diagnosis not present

## 2017-07-15 DIAGNOSIS — J449 Chronic obstructive pulmonary disease, unspecified: Secondary | ICD-10-CM | POA: Diagnosis not present

## 2017-07-15 DIAGNOSIS — J189 Pneumonia, unspecified organism: Secondary | ICD-10-CM | POA: Diagnosis not present

## 2017-07-15 DIAGNOSIS — S225XXB Flail chest, initial encounter for open fracture: Secondary | ICD-10-CM | POA: Diagnosis not present

## 2017-07-15 DIAGNOSIS — S299XXA Unspecified injury of thorax, initial encounter: Secondary | ICD-10-CM | POA: Diagnosis not present

## 2017-07-15 DIAGNOSIS — I5030 Unspecified diastolic (congestive) heart failure: Secondary | ICD-10-CM | POA: Diagnosis not present

## 2017-08-03 DEATH — deceased
